# Patient Record
Sex: Female | Born: 1935 | Race: White | Hispanic: No | Smoking: Never smoker
Health system: Southern US, Community
[De-identification: ages and names within clinical notes are randomized; demographics above are authoritative.]

## PROBLEM LIST (undated history)

## (undated) DIAGNOSIS — C679 Malignant neoplasm of bladder, unspecified: Secondary | ICD-10-CM

## (undated) DIAGNOSIS — N184 Chronic kidney disease, stage 4 (severe): Secondary | ICD-10-CM

## (undated) DIAGNOSIS — E611 Iron deficiency: Secondary | ICD-10-CM

## (undated) DIAGNOSIS — R569 Unspecified convulsions: Secondary | ICD-10-CM

## (undated) DIAGNOSIS — M549 Dorsalgia, unspecified: Secondary | ICD-10-CM

## (undated) DIAGNOSIS — R1310 Dysphagia, unspecified: Secondary | ICD-10-CM

## (undated) DIAGNOSIS — E782 Mixed hyperlipidemia: Secondary | ICD-10-CM

## (undated) DIAGNOSIS — G8929 Other chronic pain: Secondary | ICD-10-CM

## (undated) DIAGNOSIS — R531 Weakness: Secondary | ICD-10-CM

## (undated) DIAGNOSIS — G40401 Other generalized epilepsy and epileptic syndromes, not intractable, with status epilepticus: Secondary | ICD-10-CM

## (undated) DIAGNOSIS — G4700 Insomnia, unspecified: Secondary | ICD-10-CM

## (undated) DIAGNOSIS — R269 Unspecified abnormalities of gait and mobility: Secondary | ICD-10-CM

## (undated) DIAGNOSIS — E876 Hypokalemia: Secondary | ICD-10-CM

## (undated) DIAGNOSIS — I2510 Atherosclerotic heart disease of native coronary artery without angina pectoris: Secondary | ICD-10-CM

## (undated) DIAGNOSIS — E669 Obesity, unspecified: Secondary | ICD-10-CM

## (undated) DIAGNOSIS — H3530 Unspecified macular degeneration: Secondary | ICD-10-CM

## (undated) DIAGNOSIS — R42 Dizziness and giddiness: Secondary | ICD-10-CM

## (undated) DIAGNOSIS — I10 Essential (primary) hypertension: Secondary | ICD-10-CM

## (undated) DIAGNOSIS — Z951 Presence of aortocoronary bypass graft: Secondary | ICD-10-CM

## (undated) HISTORY — DX: Malignant neoplasm of bladder, unspecified: C67.9

## (undated) HISTORY — DX: Dizziness and giddiness: R42

## (undated) HISTORY — PX: CORONARY ARTERY BYPASS GRAFT: SHX141

## (undated) HISTORY — DX: Essential (primary) hypertension: I10

## (undated) HISTORY — DX: Dorsalgia, unspecified: M54.9

## (undated) HISTORY — DX: Other generalized epilepsy and epileptic syndromes, not intractable, with status epilepticus: G40.401

## (undated) HISTORY — PX: IRIDECTOMY: SHX1848

## (undated) HISTORY — DX: Unspecified convulsions: R56.9

## (undated) HISTORY — DX: Presence of aortocoronary bypass graft: Z95.1

## (undated) HISTORY — PX: CATARACT EXTRACTION: SUR2

## (undated) HISTORY — DX: Obesity, unspecified: E66.9

## (undated) HISTORY — DX: Atherosclerotic heart disease of native coronary artery without angina pectoris: I25.10

## (undated) HISTORY — DX: Mixed hyperlipidemia: E78.2

---

## 1998-04-07 HISTORY — PX: NEPHRECTOMY: SHX65

## 2002-11-22 ENCOUNTER — Inpatient Hospital Stay (HOSPITAL_COMMUNITY): Admission: EM | Admit: 2002-11-22 | Discharge: 2002-11-25 | Payer: Self-pay | Admitting: Emergency Medicine

## 2002-11-22 ENCOUNTER — Encounter: Payer: Self-pay | Admitting: Emergency Medicine

## 2002-11-30 ENCOUNTER — Ambulatory Visit (HOSPITAL_COMMUNITY): Admission: RE | Admit: 2002-11-30 | Discharge: 2002-11-30 | Payer: Self-pay | Admitting: *Deleted

## 2002-12-14 ENCOUNTER — Encounter: Payer: Self-pay | Admitting: Emergency Medicine

## 2002-12-14 ENCOUNTER — Emergency Department (HOSPITAL_COMMUNITY): Admission: EM | Admit: 2002-12-14 | Discharge: 2002-12-14 | Payer: Self-pay | Admitting: Emergency Medicine

## 2003-07-13 ENCOUNTER — Encounter: Admission: RE | Admit: 2003-07-13 | Discharge: 2003-07-13 | Payer: Self-pay | Admitting: Neurosurgery

## 2003-07-13 ENCOUNTER — Other Ambulatory Visit: Admission: RE | Admit: 2003-07-13 | Discharge: 2003-07-13 | Payer: Self-pay | Admitting: Neurosurgery

## 2003-09-08 ENCOUNTER — Emergency Department (HOSPITAL_COMMUNITY): Admission: EM | Admit: 2003-09-08 | Discharge: 2003-09-09 | Payer: Self-pay | Admitting: Emergency Medicine

## 2004-05-28 ENCOUNTER — Encounter: Payer: Self-pay | Admitting: Urology

## 2004-05-28 ENCOUNTER — Ambulatory Visit (HOSPITAL_COMMUNITY): Admission: RE | Admit: 2004-05-28 | Discharge: 2004-05-28 | Payer: Self-pay | Admitting: Urology

## 2004-06-03 ENCOUNTER — Ambulatory Visit: Payer: Self-pay | Admitting: *Deleted

## 2004-06-13 ENCOUNTER — Encounter (HOSPITAL_COMMUNITY): Admission: RE | Admit: 2004-06-13 | Discharge: 2004-07-13 | Payer: Self-pay | Admitting: *Deleted

## 2004-06-13 ENCOUNTER — Ambulatory Visit: Payer: Self-pay | Admitting: *Deleted

## 2004-06-26 ENCOUNTER — Ambulatory Visit: Payer: Self-pay | Admitting: *Deleted

## 2004-08-28 ENCOUNTER — Emergency Department (HOSPITAL_COMMUNITY): Admission: EM | Admit: 2004-08-28 | Discharge: 2004-08-28 | Payer: Self-pay | Admitting: Emergency Medicine

## 2004-10-17 ENCOUNTER — Ambulatory Visit (HOSPITAL_COMMUNITY): Admission: RE | Admit: 2004-10-17 | Discharge: 2004-10-17 | Payer: Self-pay | Admitting: Ophthalmology

## 2004-11-15 ENCOUNTER — Ambulatory Visit: Payer: Self-pay | Admitting: *Deleted

## 2005-06-07 ENCOUNTER — Emergency Department (HOSPITAL_COMMUNITY): Admission: EM | Admit: 2005-06-07 | Discharge: 2005-06-07 | Payer: Self-pay | Admitting: *Deleted

## 2005-08-06 ENCOUNTER — Encounter: Payer: Self-pay | Admitting: Emergency Medicine

## 2005-12-04 ENCOUNTER — Emergency Department (HOSPITAL_COMMUNITY): Admission: EM | Admit: 2005-12-04 | Discharge: 2005-12-04 | Payer: Self-pay | Admitting: Emergency Medicine

## 2005-12-29 ENCOUNTER — Emergency Department (HOSPITAL_COMMUNITY): Admission: EM | Admit: 2005-12-29 | Discharge: 2005-12-30 | Payer: Self-pay | Admitting: Emergency Medicine

## 2006-01-13 ENCOUNTER — Encounter: Admission: RE | Admit: 2006-01-13 | Discharge: 2006-01-13 | Payer: Self-pay | Admitting: Specialist

## 2006-01-23 ENCOUNTER — Encounter: Admission: RE | Admit: 2006-01-23 | Discharge: 2006-01-23 | Payer: Self-pay | Admitting: Specialist

## 2006-10-29 ENCOUNTER — Encounter: Admission: RE | Admit: 2006-10-29 | Discharge: 2006-10-29 | Payer: Self-pay | Admitting: Internal Medicine

## 2006-11-27 ENCOUNTER — Ambulatory Visit: Payer: Self-pay | Admitting: Cardiovascular Disease

## 2006-12-04 ENCOUNTER — Ambulatory Visit: Payer: Self-pay

## 2007-04-21 ENCOUNTER — Encounter: Payer: Self-pay | Admitting: Emergency Medicine

## 2007-04-21 ENCOUNTER — Inpatient Hospital Stay (HOSPITAL_COMMUNITY): Admission: EM | Admit: 2007-04-21 | Discharge: 2007-04-26 | Payer: Self-pay | Admitting: Emergency Medicine

## 2007-04-22 ENCOUNTER — Encounter (INDEPENDENT_AMBULATORY_CARE_PROVIDER_SITE_OTHER): Payer: Self-pay | Admitting: Neurology

## 2007-04-23 ENCOUNTER — Ambulatory Visit: Payer: Self-pay | Admitting: Physical Medicine & Rehabilitation

## 2007-05-05 ENCOUNTER — Inpatient Hospital Stay (HOSPITAL_COMMUNITY): Admission: EM | Admit: 2007-05-05 | Discharge: 2007-05-07 | Payer: Self-pay | Admitting: *Deleted

## 2007-05-06 ENCOUNTER — Encounter (INDEPENDENT_AMBULATORY_CARE_PROVIDER_SITE_OTHER): Payer: Self-pay | Admitting: Diagnostic Radiology

## 2007-05-24 ENCOUNTER — Ambulatory Visit (HOSPITAL_COMMUNITY): Admission: RE | Admit: 2007-05-24 | Discharge: 2007-05-24 | Payer: Self-pay | Admitting: Neurosurgery

## 2007-05-26 ENCOUNTER — Inpatient Hospital Stay (HOSPITAL_COMMUNITY): Admission: RE | Admit: 2007-05-26 | Discharge: 2007-05-31 | Payer: Self-pay | Admitting: Neurosurgery

## 2007-05-26 ENCOUNTER — Encounter (INDEPENDENT_AMBULATORY_CARE_PROVIDER_SITE_OTHER): Payer: Self-pay | Admitting: Neurosurgery

## 2007-06-21 ENCOUNTER — Encounter: Admission: RE | Admit: 2007-06-21 | Discharge: 2007-06-21 | Payer: Self-pay | Admitting: Neurosurgery

## 2007-08-06 ENCOUNTER — Ambulatory Visit: Payer: Self-pay | Admitting: Cardiovascular Disease

## 2008-02-21 ENCOUNTER — Ambulatory Visit: Payer: Self-pay | Admitting: Cardiovascular Disease

## 2008-05-02 ENCOUNTER — Ambulatory Visit (HOSPITAL_COMMUNITY): Admission: RE | Admit: 2008-05-02 | Discharge: 2008-05-02 | Payer: Self-pay | Admitting: Ophthalmology

## 2008-05-24 ENCOUNTER — Encounter: Payer: Self-pay | Admitting: Cardiovascular Disease

## 2008-05-24 ENCOUNTER — Ambulatory Visit: Payer: Self-pay | Admitting: Cardiovascular Disease

## 2008-05-24 DIAGNOSIS — E782 Mixed hyperlipidemia: Secondary | ICD-10-CM

## 2008-05-24 DIAGNOSIS — I10 Essential (primary) hypertension: Secondary | ICD-10-CM | POA: Insufficient documentation

## 2008-05-24 DIAGNOSIS — Z951 Presence of aortocoronary bypass graft: Secondary | ICD-10-CM | POA: Insufficient documentation

## 2008-06-21 ENCOUNTER — Ambulatory Visit (HOSPITAL_COMMUNITY): Admission: RE | Admit: 2008-06-21 | Discharge: 2008-06-21 | Payer: Self-pay | Admitting: Neurology

## 2008-08-09 ENCOUNTER — Ambulatory Visit (HOSPITAL_COMMUNITY): Admission: RE | Admit: 2008-08-09 | Discharge: 2008-08-09 | Payer: Self-pay | Admitting: Internal Medicine

## 2008-12-06 ENCOUNTER — Emergency Department (HOSPITAL_COMMUNITY): Admission: EM | Admit: 2008-12-06 | Discharge: 2008-12-06 | Payer: Self-pay | Admitting: Emergency Medicine

## 2009-01-30 ENCOUNTER — Ambulatory Visit: Payer: Self-pay | Admitting: Cardiovascular Disease

## 2009-03-20 ENCOUNTER — Telehealth: Payer: Self-pay | Admitting: Cardiovascular Disease

## 2009-11-27 ENCOUNTER — Ambulatory Visit (HOSPITAL_COMMUNITY): Admission: RE | Admit: 2009-11-27 | Discharge: 2009-11-27 | Payer: Self-pay | Admitting: Ophthalmology

## 2010-01-24 ENCOUNTER — Ambulatory Visit: Payer: Self-pay | Admitting: Cardiovascular Disease

## 2010-04-28 ENCOUNTER — Encounter: Payer: Self-pay | Admitting: Neurosurgery
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
## 2010-07-12 LAB — BASIC METABOLIC PANEL
BUN: 32 mg/dL — ABNORMAL HIGH (ref 6–23)
Calcium: 9.8 mg/dL (ref 8.4–10.5)
Creatinine, Ser: 2.14 mg/dL — ABNORMAL HIGH (ref 0.4–1.2)
GFR calc Af Amer: 27 mL/min — ABNORMAL LOW (ref >60)
GFR calc non Af Amer: 22 mL/min — ABNORMAL LOW (ref >60)

## 2010-07-12 LAB — CBC
MCV: 91.6 fL (ref 78.0–100.0)
Platelets: 151 10*3/uL (ref 150–400)
RBC: 4.19 MIL/uL (ref 3.87–5.11)
WBC: 7.9 10*3/uL (ref 4.0–10.5)

## 2010-07-12 LAB — DIFFERENTIAL
Eosinophils Absolute: 0.2 10*3/uL (ref 0.0–0.7)
Lymphs Abs: 3.4 10*3/uL (ref 0.7–4.0)
Monocytes Relative: 9 % (ref 3–12)
Neutro Abs: 3.6 10*3/uL (ref 1.7–7.7)
Neutrophils Relative %: 45 % (ref 43–77)

## 2010-07-12 LAB — URINALYSIS, ROUTINE W REFLEX MICROSCOPIC
Glucose, UA: NEGATIVE mg/dL
Specific Gravity, Urine: 1.025 (ref 1.005–1.030)
Urobilinogen, UA: 0.2 mg/dL (ref 0.0–1.0)

## 2010-07-12 LAB — URINE MICROSCOPIC-ADD ON

## 2010-07-16 LAB — COMPREHENSIVE METABOLIC PANEL
ALT: 19 U/L (ref 0–35)
AST: 26 U/L (ref 0–37)
Alkaline Phosphatase: 67 U/L (ref 39–117)
BUN: 35 mg/dL — ABNORMAL HIGH (ref 6–23)
Creatinine, Ser: 2.14 mg/dL — ABNORMAL HIGH (ref 0.4–1.2)
GFR calc non Af Amer: 22 mL/min — ABNORMAL LOW (ref >60)
Glucose, Bld: 108 mg/dL — ABNORMAL HIGH (ref 70–99)
Total Bilirubin: 0.5 mg/dL (ref 0.3–1.2)

## 2010-07-16 LAB — CBC
HCT: 39.7 % (ref 36.0–46.0)
Hemoglobin: 13.6 g/dL (ref 12.0–15.0)
MCHC: 34.2 g/dL (ref 30.0–36.0)
Platelets: 172 10*3/uL (ref 150–400)
RDW: 15.7 % — ABNORMAL HIGH (ref 11.5–15.5)

## 2010-07-22 LAB — BASIC METABOLIC PANEL
BUN: 22 mg/dL (ref 6–23)
CO2: 27 mEq/L (ref 19–32)
Calcium: 9.3 mg/dL (ref 8.4–10.5)
GFR calc non Af Amer: 28 mL/min — ABNORMAL LOW (ref >60)
Glucose, Bld: 151 mg/dL — ABNORMAL HIGH (ref 70–99)

## 2010-07-24 ENCOUNTER — Emergency Department (HOSPITAL_COMMUNITY)
Admission: EM | Admit: 2010-07-24 | Discharge: 2010-07-24 | Disposition: A | Discharge status: Home / Self Care | Payer: Medicare Other | Attending: Emergency Medicine | Admitting: Emergency Medicine

## 2010-07-24 ENCOUNTER — Emergency Department (HOSPITAL_COMMUNITY): Payer: Medicare Other

## 2010-07-24 DIAGNOSIS — R109 Unspecified abdominal pain: Secondary | ICD-10-CM | POA: Insufficient documentation

## 2010-07-24 DIAGNOSIS — Z85528 Personal history of other malignant neoplasm of kidney: Secondary | ICD-10-CM | POA: Insufficient documentation

## 2010-07-24 DIAGNOSIS — F3289 Other specified depressive episodes: Secondary | ICD-10-CM | POA: Insufficient documentation

## 2010-07-24 DIAGNOSIS — Z7982 Long term (current) use of aspirin: Secondary | ICD-10-CM | POA: Insufficient documentation

## 2010-07-24 DIAGNOSIS — Z8673 Personal history of transient ischemic attack (TIA), and cerebral infarction without residual deficits: Secondary | ICD-10-CM | POA: Insufficient documentation

## 2010-07-24 DIAGNOSIS — Z79899 Other long term (current) drug therapy: Secondary | ICD-10-CM | POA: Insufficient documentation

## 2010-07-24 DIAGNOSIS — G8929 Other chronic pain: Secondary | ICD-10-CM | POA: Insufficient documentation

## 2010-07-24 DIAGNOSIS — F329 Major depressive disorder, single episode, unspecified: Secondary | ICD-10-CM | POA: Insufficient documentation

## 2010-07-24 DIAGNOSIS — I10 Essential (primary) hypertension: Secondary | ICD-10-CM | POA: Insufficient documentation

## 2010-07-24 DIAGNOSIS — Z8551 Personal history of malignant neoplasm of bladder: Secondary | ICD-10-CM | POA: Insufficient documentation

## 2010-07-24 DIAGNOSIS — Z951 Presence of aortocoronary bypass graft: Secondary | ICD-10-CM | POA: Insufficient documentation

## 2010-07-24 DIAGNOSIS — IMO0002 Reserved for concepts with insufficient information to code with codable children: Secondary | ICD-10-CM | POA: Insufficient documentation

## 2010-07-24 DIAGNOSIS — I2510 Atherosclerotic heart disease of native coronary artery without angina pectoris: Secondary | ICD-10-CM | POA: Insufficient documentation

## 2010-07-24 DIAGNOSIS — R339 Retention of urine, unspecified: Secondary | ICD-10-CM | POA: Insufficient documentation

## 2010-07-24 DIAGNOSIS — G40802 Other epilepsy, not intractable, without status epilepticus: Secondary | ICD-10-CM | POA: Insufficient documentation

## 2010-07-24 DIAGNOSIS — N289 Disorder of kidney and ureter, unspecified: Secondary | ICD-10-CM | POA: Insufficient documentation

## 2010-07-24 DIAGNOSIS — F411 Generalized anxiety disorder: Secondary | ICD-10-CM | POA: Insufficient documentation

## 2010-07-24 LAB — DIFFERENTIAL
Basophils Absolute: 0 10*3/uL (ref 0.0–0.1)
Basophils Relative: 1 % (ref 0–1)
Eosinophils Relative: 4 % (ref 0–5)
Lymphocytes Relative: 39 % (ref 12–46)
Neutro Abs: 4 10*3/uL (ref 1.7–7.7)

## 2010-07-24 LAB — COMPREHENSIVE METABOLIC PANEL
ALT: 20 U/L (ref 0–35)
CO2: 21 mEq/L (ref 19–32)
Calcium: 9.2 mg/dL (ref 8.4–10.5)
Chloride: 111 mEq/L (ref 96–112)
Creatinine, Ser: 2.34 mg/dL — ABNORMAL HIGH (ref 0.4–1.2)
GFR calc non Af Amer: 20 mL/min — ABNORMAL LOW (ref >60)
Glucose, Bld: 101 mg/dL — ABNORMAL HIGH (ref 70–99)
Total Bilirubin: 0.3 mg/dL (ref 0.3–1.2)

## 2010-07-24 LAB — CBC
HCT: 36.6 % (ref 36.0–46.0)
MCHC: 32.2 g/dL (ref 30.0–36.0)
RDW: 14.6 % (ref 11.5–15.5)
WBC: 8 10*3/uL (ref 4.0–10.5)

## 2010-07-24 LAB — URINALYSIS, ROUTINE W REFLEX MICROSCOPIC
Ketones, ur: NEGATIVE mg/dL
Leukocytes, UA: NEGATIVE
Nitrite: NEGATIVE
Protein, ur: 30 mg/dL — AB
Urobilinogen, UA: 0.2 mg/dL (ref 0.0–1.0)
pH: 5 (ref 5.0–8.0)

## 2010-07-24 LAB — LIPASE, BLOOD: Lipase: 60 U/L — ABNORMAL HIGH (ref 11–59)

## 2010-08-20 NOTE — H&P (Signed)
NAME:  [name] [name], [name] [name]                  ACCOUNT NO.:  [id number]   MEDICAL RECORD NO.:  [id number]          PATIENT TYPE:  INP   LOCATION:  3109                         FACILITY:  [place]   PHYSICIAN:  Leeroy Cha, M.D.   DATE OF BIRTH:  [birth year]   DATE OF ADMISSION:  05/26/2007  DATE OF DISCHARGE:                              HISTORY & PHYSICAL   HISTORY:  Ms. Trolinger is a lady who has been admitted in the [facility] on several occasions because of numbness of the left upper  extremity.  The patient had several MRIs.  At the beginning it was felt  that she had a stroke, but later on, repeat of the MRIs showed the  possibility that she might have either infection or tumor of the brain.  Because of that, she is being admitted to the hospital.  I am familiar  with her and the whole family.  In my office last week, we talked about  the pros and cons of the biopsy.  The patient went home and she decided  to proceed with the biopsy because the numbness is not any better.   PAST MEDICAL HISTORY:  1. Cataract surgery.  2. Bladder cancer.  3. Hysterectomy.  4. She has a history of seizures, she is on Keppra.  5. She has also history of dizziness.   MEDICATIONS:  1. Crestor.  2. Keppra.  3. Vitamin C.   FAMILY HISTORY:  Unremarkable.   SOCIAL HISTORY:  Negative.   REVIEW OF SYSTEMS:  Mostly positive at the present time for some  headache and numbness of the left upper extremity.   PHYSICAL EXAMINATION:  HEENT:  Normal.  NECK:  She has mild discomfort with lateral rotation of the neck.  LUNGS:  Clear.  HEART:  Sounds normal.  EXTREMITIES:  Normal pulses.  NEURO:  Essentially normal except for some numbness which compromises  mostly in the left upper extremity.   DIAGNOSTICS:  The MRI and CT scan shows a lesion in the right front  parietal area.   IMPRESSION:  Right frontal parietal area lesion, rule out tumor versus  infection.   RECOMMENDATIONS:  1. The patient is  being admitted for surgery.  The surgery will be an      open biopsy using the Stealth program  And probably intraoperative      sonogram.  The __________ inability to make the diagnosis.  2. Weakness and worsening of the numbness of the left upper extremity,      infections, paralysis.           ______________________________  Leeroy Cha, M.D.     EB/MEDQ  D:  05/26/2007  T:  05/27/2007  Job:  [id number]
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
## 2010-08-20 NOTE — Consult Note (Signed)
NAME:  Dant, Zurisadai                  ACCOUNT NO.:  [id number]   MEDICAL RECORD NO.:  [id number]          PATIENT TYPE:  INP   LOCATION:  3703                         FACILITY:  [place]   PHYSICIAN:  Leeroy Cha, M.D.   DATE OF BIRTH:  May 22, 1928   DATE OF CONSULTATION:  DATE OF DISCHARGE:  05/07/2007                                 CONSULTATION   REPORT TITLE:  NEUROSURGICAL CONSULTATION   HISTORY OF PRESENT ILLNESS:  Ms. Lamoureux is a lady who has been admitted to  the hospital on two occasions because of weakness and numbness on the  left side.  The first time she improved.  She went home and then she  developed the same problem with numbness.  She was admitted to the  hospital.  In the hospital, she has had repeat MRI and a CT scan which  showed the possibility of a lesion in the right parietal area.  The  patient had a lumbar puncture yesterday with the CSF being unremarkable.  At the present time, the patient is awake following commands.  She  complains of heaviness of the left arm and left leg with some numbness.  Otherwise, she is clinically stable.  I looked at the 2 MRIs and the 2  CT scans and indeed in the right parietal area there is a lesion with  attenuation of the gyrus.  There is no displacement.  All the previous  __________.   CLINICAL IMPRESSION:  Mass in the right parietal area rule out the  possibility of low grade glioma, lymphoma.   RECOMMENDATIONS:  I talked to Dr. Leonie Man, who is the neurologist in  charge.  I talked to the patient at length.  I fully explained to her  what the problem is.  I agree that the only way to find out what is  going on is with a biopsy.  We are going to bring her family to talk to  them and will make a decision and hopefully we will do the biopsy early  next week.           ______________________________  Leeroy Cha, M.D.     EB/MEDQ  D:  05/07/2007  T:  05/07/2007  Job:  [id number]

## 2010-08-20 NOTE — Op Note (Signed)
NAME:  [name] [name], [name] [name]                  ACCOUNT NO.:  [id number]   MEDICAL RECORD NO.:  [id number]          PATIENT TYPE:  INP   LOCATION:  3109                         FACILITY:  [facility]   PHYSICIAN:  Leeroy Cha, M.D.   DATE OF BIRTH:  Jul 16, 1928   DATE OF PROCEDURE:  05/26/2007  DATE OF DISCHARGE:                               OPERATIVE REPORT   PREOPERATIVE DIAGNOSIS:  Mass in the right frontal parietal area.   POSTOPERATIVE DIAGNOSES:  Mass in the right frontal parietal area.   PROCEDURE:  Open biopsy of the right frontoparietal mass, Stealth.   SURGEON:  Leeroy Cha, M.D.   ASSISTANT:  Hosie Spangle, M.D.   CLINICAL HISTORY:  The patient was admitted after she had a complete  workup by a neurologist.  Stroke was ruled out.  MRI showed enhanced  lesion in the right frontoparietal area.  Biopsy was done to rule out  possibility of infection versus tumor.  The risks were explained to her  and the family.   PROCEDURE:  The patient was taken to the OR and after intubation,  drapings were applied to the head.  With the Stealth equipment, we  localized precisely the area where the tumor was.  Then drapes were  applied.  Infiltration and an S-italic type of shape incision was made  in the scalp.  Then a bur hole was made and a small inch craniotomy was  done.  Immediately we found that the dura mater was thick.  Incision was  made and once we opened the dura mater we found there was two gyrus.  One was completely normal and the other was dull and with some  cloudiness.  It had piece of biopsy of the dura mater was made and we  took two pieces of biopsies of the brain which was sent to the  laboratory.  It was reported as possibility of no tumor, probably  infection.  After that, the area was irrigated.  Hemostasis was done  with bipolar.  Duragen was used to protect the brain.  The tack up  suture from the edges of the bone to the dura mater was were made and  the bone flap  was put back in place using two small plates.  The scalp  was closed with Vicryl and staples.  The patient did well and she is  going back to the intensive care unit.           ______________________________  Leeroy Cha, M.D.     EB/MEDQ  D:  05/26/2007  T:  05/27/2007  Job:  [id number]   cc:   Pramod P. Leonie Man, MD

## 2010-08-20 NOTE — Discharge Summary (Signed)
NAME:  [name] [name], [name] [name]                  ACCOUNT NO.:  [id number]   MEDICAL RECORD NO.:  [id number]          PATIENT TYPE:  INP   LOCATION:  3029                         FACILITY:  [place]   PHYSICIAN:  Pramod P. Leonie Man, MD    DATE OF BIRTH:  Oct 21, 1928   DATE OF ADMISSION:  04/21/2007  DATE OF DISCHARGE:  04/26/2007                               DISCHARGE SUMMARY   DIAGNOSIS AT TIME OF DISCHARGE.:  1. Right parietal subarachnoid hemorrhage, question etiology,      angiogram normal.  2. Probable seizure, placed on Keppra.  3. Coronary artery disease with myocardial infarction status post      coronary artery bypass graft in 1989.  4. Hypercholesterolemia.  5. Obesity.  6. Chronic back pain.  7. Chronic dizziness.  8. Hysterectomy.  9. Bladder cancer.  10.Recent cataract surgery on the right eye with a history of some      bleeding on that side.  11.Hyperhomocysteinemia.   MEDICINES AT TIME OF DISCHARGE.:  1. Crestor 20 mg a day.  2. Multivitamin a day.  3. Keppra 250 mg b.i.d.  4. Metanx one a day.  5. Vitamin C once a day.   STUDIES PERFORMED:  1. MRI of the brain shows acute subacute partially hemorrhagic right      parietal lobe infarct, abnormal appearance of the sulci in this      region, atrophy, old right corona radiata infarct, and minimal      small vessel disease, degenerative changes C3-C4.  2. Follow-up MRI of the brain without contrast shows increased signal      within the subarachnoid space and posterior right frontal and      parietal lobes.  There are secondary changes within the parenchyma      which appears to be subarachnoid process, concern for hemorrhage,      or infections.  Next, remote lacunar infarct involving the right      putamen and anterior limb of the internal capsule, mild age-      advanced atrophy.  3. MRA of the head shows potential high-grade stenosis, supraclinoid      right internal carotid artery.  Vessel loop versus 4 mm aneurysm at  the ICA terminus on the left.  Right greater than left proximal PCA      stenosis.  This may be high-grade on the right.  Segmental signal      loss within the MCA branches compatible with small vessel      intracranial atherosclerotic changes.  4. MRA of the neck shows no significant carotid artery disease.      Incomplete evaluation of the proximal vessels of the neck at that      aortic arch due to technical factors.  Dominant right vertebral      artery.  Left vertebral artery shows significantly diminished      signal that could represent high-grade proximal stenosis or      dissection.  5. CT of the brain shows persistent sulcal effacement and slight      increased density within the sulci of the  posterior right frontal      lobe.  Stable remote lacunar infarcts of the basal ganglia on the      right.  No other new acute abnormality.  6. Follow-up MRI on April 22, 2007, shows subarachnoid hemorrhage,      question etiology, does not have typical appearance.  There is      increase in signal in the right cerebellum and a tiny acute      infarct.  This level cannot be excluded.  A tiny area of blood      breakdown products within the anterior right temporal lobe related      to prior episode of hemorrhagic ischemia.  There is a tiny      cavernous angioma resultant of trauma.  Could consider metastatic      disease.  Old infarct, right caudate head superior anterior aspect      of the right basal ganglia.  7. MRA of the brain again shows primary intracranial atherosclerotic-      type changes, right ICA carotid.  8. Cerebral angiogram shows a focal area of severe stenosis involving      the right MCA main trunk right proximal to the bifurcation.  Severe      focal stenosis of left vertebral artery at its origin with      opacification of the left pica.  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  Focal area of      severe stenosis of the  right posterior cerebral artery P1 segment.      50% stenosis of the right internal carotid artery at its distal      cavernous proximal supraclinoid segment.  9. EEG abnormal secondary to diffuse slowing.  10.2-D echocardiogram shows EF of 60% with no left ventricular      regional wall motion abnormalities.  No embolic source.  11.Carotid Doppler shows no ICA stenosis.   LABORATORY STUDIES:  CBC normal.  Chemistry with creatinine 1.7, glucose  106, otherwise normal.  Coagulation studies normal.  Liver function  tests normal.  Cardiac enzymes negative.  Cholesterol 235, triglycerides  147, HDL 30, LDL 176.  Urinalysis with 7-10 white blood cells, 0-2 red  blood cells, few squamous epithelium, no protein, small leukocyte  esterase.  Sedimentation rate is 30.  Complement C3 181, C4 30.  ANA  negative.  CH50 62.  Homocystine 18.5. Hypercoagulable panel with  antithrombin-III 119, protein C functional high 151, protein C 103,  protein S functional 122.  Lupus anticoagulant not detected.  No  prothrombin-II gene mutation.  Hemoglobin A1c of 6.3.   HISTORY OF PRESENT ILLNESS:  [name] [name] is a 77 year old right-handed  Caucasian female with a past medical history of coronary artery disease  and hypercholesterolemia.  Sunday night she went to bed in her normal  state of health and awoke 6:00 a.m. on Monday morning, feeling numb on  the left side of her body.  She also felt weak on that side with some  blurring of her vision.  She called her primary care physician, but they  were unable to see her until Wednesday.  When she was seen, she was was  then sent to the emergency room for evaluation of possible stroke which  is now two days later.  She also complains of some blurring of her  vision.  An MRI scan done in the emergency room confirmed that she has  what looks like a subacute stroke in the right parietal region.  She  notes an episode similar to this in November 2008 where she had a  left  hematemesis while shopping.  This cleared up days later and she was  almost completely back to baseline but would not go and seek any medical  care at that time.  In the emergency room, she was not a TPA candidate  secondary to time of onset being greater than three hours.  She was  admitted to the hospital for further stroke workup.   HOSPITAL COURSE:  Hospital course was positive for right parietal lobe  localized subarachnoid hemorrhage.  Cerebral angiogram showed no  aneurysm or other etiology of the hemorrhage.  The patient, of note,  also had a new right pontine infarct which was likely representative of  her current symptoms.  She also had a question right brain seizure and  was started on Keppra.  She was seen by PT, OT, and Speech and felt she  would benefit from rehab.  However, she improved significantly during  hospitalization and home health was felt to be most appropriate.  Arrangements were made for home health PT and OT and patient was ready  for discharge on April 25, 2007.  However, there was no was one to  transport her there.  On April 26, 2007, the patient was discharged  without incident.  She was placed on on Ultram for headache and will  continue on Keppra and follow up with Dr. Leonie Man in 2-3 months.   CONDITION ON DISCHARGE:  The patient alert and oriented x3.  No aphasia.  No dysarthria.  Eye movements are full.  No field defects.  Moves all  extremities x4.   DISCHARGE/PLAN:  1. Discharge home with family.  2. Home health PT and OT.  3. No aspirin or aspirin-containing products.  4. Ongoing risk factor control by primary care physician.  5. Follow-up Dr. Leonie Man in 2-3 months.  6. Ultram for headache.  7. Continue Keppra for now.      Burnetta Sabin, N.P.    ______________________________  Kathie Rhodes. Leonie Man, MD    SB/MEDQ  D:  04/26/2007  T:  04/26/2007  Job:  [id number]   cc:   Pramod P. Leonie Man, MD  [facility] Durie, P.A.
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
## 2010-08-20 NOTE — Procedures (Signed)
EEG NUMBER:  [id number]   HISTORY:  This is a 77 year old with left-sided weakness, numbness.  The  patient had an EEG done to evaluate for seizure activity.   PROCEDURE:  This is a portable EEG.   TECHNICAL DESCRIPTION:  Throughout this portable EEG there is no  distinct or sustained posterior dominant rhythm noted.  The background  activity is fairly symmetrical and was comprised of mixed frequency  activity, primarily theta range activity at 10-20 microvolts with photic  stimulation or hyperventilation performed throughout this record.  The  patient does fall asleep during this tracing.  Throughout the circuit  there is no evidence of electrographic seizures or discharge activity.  Heart rate shows 70 to 100 beats per minute.   IMPRESSION:  This routine electroencephalogram is abnormal secondary to  a diffuse slowing, but diffuse slowing is suggestive of a toxic,  metabolic or primary neuronal disorder.  Clinical correlation is  advised.      Shaune Pascal. Estella Husk, M.D.  Electronically Signed     [id number]  D:  04/23/2007 19:45:44  T:  04/23/2007 23:23:29  Job #:  [id number]

## 2010-08-20 NOTE — Discharge Summary (Signed)
NAME:  [name] [name], [name] [name]                  ACCOUNT NO.:  [id number]   MEDICAL RECORD NO.:  [id number]          PATIENT TYPE:  INP   LOCATION:  3703                         FACILITY:  [facility]   PHYSICIAN:  Pramod P. Leonie Man, MD    DATE OF BIRTH:  17-Jun-1928   DATE OF ADMISSION:  05/04/2007  DATE OF DISCHARGE:  05/07/2007                               DISCHARGE SUMMARY   DIAGNOSES AT TIME OF DISCHARGE:  1. Left hemiparesis, question etiology in setting of right parietal      edema.  Question tumor versus infection.  Stroke ruled out.  2. Dyslipidemia.  3. Hyperhomocysteinemia.  4. Obesity.  5. Probable seizures.  6. Coronary artery disease, status post coronary artery bypass graft      in 1989.  7. Bladder cancer.  8. Chronic back pain.  9. Chronic dizziness.  10.Hysterectomy.  11.Recent cataract surgery on the right eye with monthly injections.   MEDICINES AT THE TIME OF DISCHARGE:  1. Crestor 20 mg a day.  2. Keppra 250 mg b.i.d.  3. Metanx once a day.  4. Multivitamin a day.  5. Vitamin C a day.  6. Meclizine p.r.n.  7. Vicodin p.r.n.   STUDIES PERFORMED:  A CT of the brain on admission showed an abnormally  swollen and hyperdense right posterior frontoparietal cortex.  Time  course is not typical for infarction.  There may be leptomeningeal  spread of tumor,  atypical infarct or infection/inflammation.   MR of the brain shows persistent abnormality within the right parietal/  posterior frontal region.  Raises possibility of leptomeningeal spread  of tumor with other considerations.   An LP under fluoro with 19 mL CSF removed, 12 cm of opening pressure.  See results below.   EKG shows normal sinus rhythm, left axis deviation, anterior infarct,  age undetermined.   LABORATORY STUDIES:  CBC with white blood cells 11.7, otherwise normal.  Chemistry with creatinine 1.8.  Frozen spinal fluid:  Spinal fluid  glucose 59, total protein 68.  Cell count with 360 red blood cells, 11  white blood cells, 1 neutrophil, 94 lymphocytes, 5 monocytes, 0  eosinophils.  Cryptococcal antigen is negative.  Titer is not indicated.  Cytology shows no malignant cells.  No other organisms.  Increased cells  of inflammation, specifically lymphocytes.   HISTORY OF PRESENT ILLNESS:  [name] [name] is a 77 year old right-  handed Caucasian female who was recently hospitalized January 14 through  January 19 with left-sided weakness.  This had happened previously in  November and resolved on its own.  The patient was evaluated and found  to have what appeared to be effacement of the sulci over the right  posterior frontal and parietal region suggesting a subacute stroke.  MRI  did not support this but did show evidence of increased signal within  the subarachnoid spaces, suggesting subarachnoid blood, and also some  changes on the diffusion that suggested very limited ischemia that did  not appear to be confluent or wedge-shaped to suggest a branch  occlusion.   At that time, the patient had an  angiogram, which failed to show  evidence of AV malformation or aneurysmal dilatation.  It did show  significant narrowing of the right MCA main trunk proximal to the  bifurcation and severe focal stenosis of the left vertebral artery at  its origin with opacification of the left PICA.  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  The EEG showed  diffuse slowing.  A 2-D echocardiogram showed EF of 60% with no left  ventricular regional wall motion abnormalities and no source of emboli.  Carotid Doppler showed no hemodynamically significant stenosis.  MRA at  that time showed intracranial atherosclerotic disease.  There was a  small increase in signal in  the right cerebellum and a tiny acute  infarct in the middle cerebellar peduncle.  There was a tiny cavernous  angioma.  There was an old infarction in the right caudate head,  superior aspect.   The patient was discharged home able to ambulate on her own but with  some mild weakness in her leg.   The night of admission, she finished dinner and sat in her chair to  watch a TV program at 8:00 p.m.  She tried to get up somewhere between  10:00 and 10:30 and her leg gave way.  She hyperflexed the toes of her  left side, spraining them.  She presented to [facility] emergency room  with weakness of the left arm and leg.  These  substantially cleared,  but she continued to have left-sided weakness.  She also complained of  headache and nausea.  Neurology was called to evaluate the patient.  The  CT of the brain continued to show effacement of the sulci.  The patient  was admitted for further  exploration.   HOSPITAL COURSE:  MRI was completed, which did not reveal an acute  stroke but  increased edema in the temporal parietal region since  admission two  weeks ago.  Lumbar puncture was performed with no  malignant cells noted.  Question etiology of this edema though feel it  is more likely to be tumor than infection due to the patient's  stabilizing neuro status and improvement in neurologic symptoms.  The  patient has agreed to biopsy by Dr. Joya Salm next week on Tuesday or  Wednesday.  In the meanwhile, the patient is medically stable for  discharge.  Home health PT and OT have been arranged, and she will  discharge home with readmission per Dr. Joya Salm.   CONDITION ON DISCHARGE:  Alert and oriented x3.  No aphasia.  No  dysarthria.  No arm drift.  No focal weakness.  No sensory loss.   DISCHARGE/PLAN:  1. Discharge home.  2. Continue medicines as prior to admission.  3. Plan brain biopsy by Dr. Leeroy Cha on Tuesday or Wednesday      next week.  4. Home Health PT, OT followup by  [place].  5. Follow up with Dr. Mamie Nick P. Sethi in 2 to 3 months.      Burnetta Sabin, N.P.    ______________________________  Kathie Rhodes. Leonie Man, MD    SB/MEDQ  D:  05/07/2007  T:  05/08/2007  Job:  [id number]   cc:   Pramod P. Leonie Man, MD  Leeroy Cha, M.D.  Kemper Durie, P.A.

## 2010-08-20 NOTE — Procedures (Signed)
NAME:  [name] [name], [name] [name]                  ACCOUNT NO.:  [id number]   MEDICAL RECORD NO.:  [id number]          PATIENT TYPE:  OUT   LOCATION:  RESP                          FACILITY:  [facility]   PHYSICIAN:  Kofi A. Merlene Laughter, M.D. DATE OF BIRTH:  July 25, 1928   DATE OF PROCEDURE:  06/21/2008  DATE OF DISCHARGE:                              EEG INTERPRETATION   REFERRING PHYSICIAN:  Pramod P. Leonie Man, MD   INDICATIONS:  This is a 76 year old female who presents with recurrent  seizures.   MEDICATIONS:  Keppra, Vicodin , vitamin B12, vitamin B6, Crestor,  lisinopril, temazepam, meclizine.   ANALYSIS:  A 16-channel recording is conducted for approximately 20  minutes.  There is a posterior dominant rhythmic as high as 7 Hz, but  typically runs between 6-7 Hz.  There is beta activities observed in the  frontal areas.  Awake and drowsy activities are recorded.  Photic  stimulation is carried out without significant changes in the background  activity.  No clear focal or lateralized slowing is observed.  There is  recurrent sharp wave activity observed at C4 where its phase reverses,  although not associated with the fields.  The sharp wave activity seen  in multiple montages.   IMPRESSION:  Abnormal recording due to the following:  1. Right central epileptiform discharges.  2. Mild generalized slowing.      Kofi A. Merlene Laughter, M.D.  Electronically Signed     KAD/MEDQ  D:  06/22/2008  T:  06/22/2008  Job:  [id number]
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
## 2010-08-20 NOTE — H&P (Signed)
NAME:  [name] [name], [name] [name]                  ACCOUNT NO.:  [id number]   MEDICAL RECORD NO.:  [id number]          PATIENT TYPE:  INP   LOCATION:  3703                         FACILITY:  [facility]   PHYSICIAN:  Princess Bruins. Hickling, M.D.DATE OF BIRTH:  [birth year]   DATE OF ADMISSION:  05/05/2007  DATE OF DISCHARGE:                              HISTORY & PHYSICAL   CHIEF COMPLAINT:  Left-sided weakness.   HISTORY OF THE PRESENT CONDITION:  The patient was recently hospitalized  at [facility], January 14 to January 19 with left-sided  weakness.  This had happened previously in November and resolved on its  own.  The patient was evaluated and was found to have what appeared to  be effacement of the sulci over the right posterior frontal and parietal  regions suggesting a subacute stroke.  MRI scan did not show this, but  did show evidence of increased signal within the subarachnoid spaces,  suggesting subarachnoid blood, and also some changes on the diffusion  that suggested very limited ischemia that did not appear to be confluent  or wedge-shaped to suggest a branch occlusion.   The patient had an angiogram which failed to show evidence of  arteriovenous malformation or aneurysmal dilatation.  It did show  evidence of significant narrowing of the right middle cerebral artery  main trunk proximal to the bifurcation and severe focal stenosis of left  vertebral artery at its origin with opacification of the left PICA.  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  EEG showed diffuse slowing.  A 2-D echocardiogram  showed ejection fraction of 60% with no left ventricular regional wall  abnormalities and no source of emboli; carotid  Doppler, no  hemodynamically significant stenosis.  MRA showed intracranial  atherosclerotic disease.  There was a small increase in signal in the  right cerebellum and a tiny acute infarct in the middle cerebellar  peduncle.  There was a tiny cavernous angioma.  There was an old  infarction in the right caudate head, superior aspect.   The patient was discharged home, able to ambulate on her own, but with  mild weakness in her leg.   Tonight, she finished dinner and sat in a chair to watch a TV program at  8 o'clock.  She tried to get up somewhere between 10 and 10:30 and her  leg gave way.  She hyperflexed the toes of her left side, spraining  them.  She presented to [facility] with weakness of the  left arm and leg.  These substantially cleared, but she continued to  have left-sided weakness.  She also complained headache and nausea.   I was contacted by Dr. Orlie Dakin and agreed to admit the patient to  the hospital and assess her.   CT scan of the brain was carried out and  continues to show effacement of  the sulci.  The films are largely unchanged from April 21, 2006.  The  patient says that she cannot have another MRI scan unless she is heavily  sedated; she is very claustrophobic.   CURRENT MEDICATIONS:  1. Meclizine 25 mg as needed every 6 hours.  2. Crestor 20 mg daily.  3. Keppra 250 mg twice daily.  4. Metanx 1 tablet daily.  5. Vicodin 1 every 6 hours as needed.   DRUG ALLERGIES:  PENICILLIN and SULFA.   FAMILY HISTORY:  Positive for stroke, coronary artery disease, cancer  and diabetes mellitus.   SOCIAL HISTORY:  The patient is a retired [employer] scientist.  She is married.  She  lives with her husband.  She does not use tobacco, alcohol or drugs.   REVIEW OF SYSTEMS:  Left-sided weakness has worsened.  CARDIOVASCULAR:  She has coronary artery disease.  PULMONARY:  No COPD.  GI: Positive for  nausea.  Negative for vomiting and diarrhea.  GU:  No  urinary tract  infection or dysuria.  MUSCULOSKELETAL: She has pain in her proximal  interphalangeal joints of the toes on the left.  REPRODUCTIVE:  Postmenopausal.  ENDOCRINE:  No diabetes.  No anemia or bruisability.  ALLERGY:  Negative.  NEUROPSYCHIATRIC:  No depression or anxiety.   PAST MEDICAL HISTORY:  Positive for:  1. Chronic low back pain.  2. Complaints of dizziness.  3. Macular degeneration, right eye greater than left.  She receives      injections in the eye for that time.   PAST SURGICAL HISTORY:  1. Coronary artery bypass graft in 1989.  2. Hysterectomy.  3. Bladder cancer.  4. Right iridectomy.   EXAMINATION:  VITAL SIGNS:  Blood pressure 136/79, resting pulse 71,  respirations 18, oxygen saturation 93%, weight 83.6 kg, temperature  97.8.  HEENT:  No signs of infection.  NECK:  No bruits.  Supple neck.  LUNGS:  Clear.  HEART:  No murmurs.  ABDOMEN:  Bowel sounds normal.  SKIN:  Negative.  NEUROLOGIC:  Awake, alert, attentive, appropriate.  Cranial nerves are  intact.  She has round reactive pupils.  She has small fundi; it  difficult to see.  I believe that I can see the macular degeneration in  the right eye; I cannot see it on the left.  Visual fields are full to  confrontation and to double simultaneous stimuli.  Symmetric facial  strength.  Midline tongue and uvula.  Air conduction greater than bone  conduction bilaterally.  Motor Examination:  She has slight drift of the  left arm.  She has 4+/5 strength in the left arm and clumsy fine motor  movements with finger apposition.  With finger tapping, she actually  moves her thumb and forefinger quite well.  The right side is normal.  In the lower extremities, she has 4-/5 strength proximally, 4+ distally.  She has very tender toes and so it is hard to examine the left foot.  The right leg is entirely normal.  Sensation showed slight decrease to  pinprick in the left arm that was reproducible.  She had equal  sensation  in her face bilaterally and legs bilaterally.  She had a good  proprioceptive sense, slight decreased vibration distally, good  stereoagnosis bilaterally.  She did not extinguish to double  simultaneous stimuli.   Her NIH stroke scale score was 4, modified Rankin 1.   LABORATORY TESTS:  Sodium 138, potassium 3.9, chloride 106, CO2 26.1,  creatinine 1.8, BUN 23, glucose 119.  White blood cell count 11,700,  hemoglobin 13.7, hematocrit 40, platelet count 267,000.   IMAGING STUDY:  CT scan was described above.   IMPRESSION:  Recurrent left-sided weakness. (342.02) The patient does  have significant stenosis in the middle cerebral artery on the right  side, so this could represent transient ischemic attack versus actually  a completed stroke.  I think it would be worthwhile to try to sedate her  and repeat her MRI scan.  She needs physical therapy for gait training.  I do not believe that she needs other laboratories carried out.  She had  a swallowing screen which was normal.  Her risk factors are tended to at  this time as a result of the previous hospitalization.      Princess Bruins. Gaynell Face, M.D.  Electronically Signed     WHH/MEDQ  D:  05/05/2007  T:  05/05/2007  Job:  [id number]   cc:   Kemper Durie, P.A.

## 2010-08-23 NOTE — Cardiovascular Report (Signed)
NAME:  GEORGI, HAKIMIAN NO.:  [id number]   MEDICAL RECORD NO.:  [id number]                   PATIENT TYPE:  OIB   LOCATION:  6527                                 FACILITY:  [facility]   PHYSICIAN:  Satira Sark, M.D. [facility]        DATE OF BIRTH:  1928/07/18   DATE OF PROCEDURE:  11/24/2002  DATE OF DISCHARGE:                              CARDIAC CATHETERIZATION   CARDIOLOGIST:  Christy Sartorius, M.D.   PRIMARY CARE PHYSICIAN:  Trellis Moment, MD   INDICATIONS:  Ms. Alloway is a 77 year old woman with a history of  hypertension, dyslipidemia, and bladder cancer awaiting potential  cystectomy.  She also has known coronary artery disease status post single  vessel coronary artery bypass grafting in 1989 in [employer], [place].  She  apparently underwent a LIMA to the LAD at that time but no specifics are  available.  There is also potential report of subsequent intervention,  although again, no specifics are available.  She apparently had a Cardiolite  back in April that was negative for ischemia.  She presents now with  symptoms of significant dizziness which by description sounds potentially  like vertigo.  She has had near syncope and does have a history of chest  pain for which she takes sublingual nitroglycerin.  She is referred for the  assessment of progression of coronary disease prior to proceeding with  urological surgery.   PROCEDURES PERFORMED:  1. Left heart catheterization.  2. Selective coronary angiography.  3. Bypass graft angiography.  4. Left ventriculography.   ACCESS AND EQUIPMENT:  The area about the right femoral artery was  anesthetized with 1% lidocaine and a 6-French sheath was placed in the right  femoral artery via the modified Seldinger technique.  Standard preformed 6-  [place] and JR4 catheters were used for selective coronary angiography  and an angled pigtail catheter was used for left heart catheterization and  left  ventriculography.  All exchanges were made over a wire and the patient  tolerated the procedure well without immediate complications.   HEMODYNAMICS:  1. Left ventricle 165/25 mmHg.  2. Aorta 165/60 mmHg.   ANGIOGRAPHIC FINDINGS:  1. The left main coronary artery is free of significant flow limiting     coronary atherosclerosis.  2. The left anterior descending is a small to medium caliber vessel with a     large bifurcating proximal diagonal branch.  There is 40-50% ostial left     anterior descending stenosis.  The left anterior descending is occluded     at the mid vessel level.  The distal vessel fills with well-developed     collaterals from the right coronary system and the large diagonal branch.  3. The circumflex coronary artery has no flow limiting coronary     atherosclerosis.  4. The right coronary artery is dominant and medium in caliber.  There are     20% proximal  and mid vessel stenoses with other minor luminal     irregularities.  5. The LIMA to the LAD is patent, but essentially fills a septal perforator     as a left anterior descending appears to be occluded proximal to the     graft insertion and just distal to it as well.  As described previously,     the distal left anterior descending fills fairly well by collaterals from     the right system and diagonal branch.   LEFT VENTRICULOGRAPHY:  Left ventriculography is performed in the RAO  projection revealing an ejection fraction of 60-65% with trace mitral  regurgitation.   DIAGNOSES:  1. Occluded mid left anterior descending with good distal filling by well-     developed collaterals from the right coronary and diagonal branches.  The     left internal mammary artery to the left anterior descending is patent,     but essentially just fills a septal perforator.  No other major     obstructive stenoses are noted.  2. Left ventricular ejection fraction of 60-65% with trace mitral     regurgitation.    RECOMMENDATIONS:  Would continue aggressive risk factor modification and  medical therapy.                                                Satira Sark, M.D. [facility]    SGM/MEDQ  D:  11/24/2002  T:  11/25/2002  Job:  [id number]

## 2010-08-23 NOTE — Consult Note (Signed)
NAME:  [name] [name], [name] [name]                            ACCOUNT NO.:  [id number]   MEDICAL RECORD NO.:  [id number]                   PATIENT TYPE:  INP   LOCATION:  [id number]                                 FACILITY:  [facility]   PHYSICIAN:  Christy Sartorius, M.D.                   DATE OF BIRTH:  November 02, 1928   DATE OF CONSULTATION:  11/23/2002  DATE OF DISCHARGE:                                   CONSULTATION   CHIEF COMPLAINT:  Near syncope.   HISTORY:  Ms. Bowar is a 77 year old white female with known history of  coronary disease who presents with several episodes of near syncope.  The  symptoms started about Monday two days prior to admission when she had  several fleeting episodes during the day.  These lasted two to three minutes  and they were described as sudden onset of a black sensation and profound  weakness.  She did not actually lose consciousness.  This continued to occur  overnight when she went to the bathroom.  She had no chest discomfort but  did have some subjective shortness of breath, no diaphoresis.  On Tuesday  she felt somewhat nauseous.  She presented to Dr. Ralene Muskrat office and was  promptly sent to the emergency room.  She has not had any recurrence of  symptoms during hospitalization although the patient does describe being  slightly dizzy.  She describes these episodes as a sensation of the room  going in circles and unable to get her bearings.  Again, she did not  actually lose consciousness.  She denies any seizure-like activity.  No loss  of bladder or bowel function, no visual changes, no prodromal symptoms.  She  has a history of stable angina for which she uses approximately five  nitroglycerin per month and some dyspnea on exertion.  She has diffuse  arthralgias but review of systems otherwise noncontributory.  At [facility], she has undergone a CT of the head showing old lacunar strokes.  Cardiac enzymes have been negative and an ECG has shown no acute  changes.   PAST MEDICAL HISTORY:  1. Notable for a history of coronary disease status post myocardial     infarction and single-vessel bypass in 1989.  She subsequently had     percutaneous intervention, last intervention 1992, residual disease of     the single-vessel which was not treated due to tortuosity.  She has had a     Cardiolite scan performed in [place], [place] earlier this year by Dr.     Kenton Kingfisher at [facility] which reportedly showed significant     ischemia, normal LV function.  Based on that she had been cleared for a     planned surgery of the bladder due to bladder cancer.  2. Bladder cancer awaiting cystectomy.  3. History of renal cell cancer status post right nephrectomy.  4. Hypertension.  5. Dyslipidemia.  6. Obesity.  7. Depression.  8. Chronic lumbar pain.   SOCIAL HISTORY:  The patient lives in [place] with son and his wife.  She  is a retired Marine scientist.  No history of alcohol or tobacco use.   FAMILY HISTORY:  Mother died at the age of 65 of colon cancer.  Father died  at the age of 6 of myocardial infarction.  One brother deceased myocardial  infarction at 61.  One brother alive with a history of coronary disease and  CABG in his 70s.  One sister alive with a history of renal cell cancer.   ALLERGIES:  PENICILLIN and SULFA which cause hives.   MEDICATIONS:  Medications at home include Lexapro 10 mg q.h.s., Ditropan XL  30 mg daily, captopril 25 mg daily, hydrocodone/APAP 5/500 mg as needed, and  nitroglycerin as needed.  During hospitalization, she has also been placed  on aspirin 325 mg daily, Altace 5 mg daily, and Lovenox 80 q.12 h.   PHYSICAL EXAMINATION:  GENERAL:  On examination, she is an overweight white  female in no acute distress.  VITAL SIGNS:  Temperature 97.6, pulse 62, blood pressure 137/72,  respirations 16.  O2 saturation 96% on room air.  HEENT:  Pupils equal, round, reactive to light.  Extraocular muscles are  intact.  Oropharynx shows no lesions.  NECK:  Supple.  No adenopathy.  No bruits.  HEART:  Reveals a regular rate without murmurs.  LUNGS:  Clear to auscultation bilaterally.  ABDOMEN:  Soft, nontender.  EXTREMITIES:  No significant edema.  Peripheral pulses are 1+ and equal  bilaterally.  Motor strength is 5/5.  Sensory is intact bilaterally.   LABORATORY AND ACCESSORY DATA:  ECG is sinus bradycardia at 60.  No acute  ischemic changes.   Laboratory data shows a white count of 7.1, hemoglobin 14.1, hematocrit  40.9, platelets 229.  Sodium 136, potassium 4.1, chloride 105, bicarb 27,  BUN 15, creatinine 1, glucose 99.  Cholesterol is 232, triglycerides 206,  HDL 38, LDL 153.   ASSESSMENT AND PLAN:  Ms. Witz is a 77 year old female with a history of  coronary artery disease as well as bladder cancer awaiting a cystectomy who  presents with vertigo and near syncope of unclear etiology.  A head scan did  not show acute hemorrhagic stroke.  It may be prudent to obtain an MRI to  rule out ischemic stroke.  We will also check a carotid ultrasound to rule  out stenosis here.  Alternatively, the patient may have inner ear problem  consistent with the vertigo.  The patient does not appear to be orthostatic  although this is certainly a consideration.  In addition, coronary artery  disease and ischemia is a strong possibility.  The patient has not had an  assessment of her coronary anatomy in some time and while a Cardiolite study  in April of this year suggested no significant ischemia, we certainly cannot  rule out balanced ischemia and underlying severe disease.  I have discussed  treatment options and further assessment with the patient and her daughter  and we have agreed to proceed with cardiac catheterization to redefine her  anatomy and rule out the progression of disease as potential cause of her symptoms.  In the interim will continue to follow her closely, will advance  her antihypertensives  as well as initiate therapy with a statin given her  significant dyslipidemia.  Should cardiac workup prove unrevealing, a formal  assessment by a neurology service may be beneficial.                                               Christy Sartorius, M.D.    NG/MEDQ  D:  11/23/2002  T:  11/23/2002  Job:  [id number]   cc:   Marshall Cork. Jeffie Pollock, M.D.  [facility]. [address] [place]  [place] [address]  Fax: [phone]   Scarlett Presto, M.D.  Fax: [phone]   Benito Mccreedy, M.D.  [place]. [place]  [place] [address]  Fax: [phone]

## 2010-08-23 NOTE — Discharge Summary (Signed)
NAME:  [name] [name], [name] [name] NO.:  [id number]   MEDICAL RECORD NO.:  [id number]                   PATIENT TYPE:  OIB   LOCATION:  6527                                 FACILITY:  [facility]   PHYSICIAN:  Satira Sark, M.D. [facility]        DATE OF BIRTH:  March 14, 1929   DATE OF ADMISSION:  11/24/2002  DATE OF DISCHARGE:  11/25/2002                           DISCHARGE SUMMARY - REFERRING   DISCHARGE DIAGNOSES:  1. Admitted with presyncopal episodes.  History of coronary artery disease,     status post coronary artery bypass graft surgery x1 in 1989, status post     stenting x2, last one in 1992.  Cardiac enzymes negative.  Electrocardiogram shows no acute changes.  Cardiolite in April 2004 showed  no significant ischemia; however, the patient has not undergone cardiac  catheterization to rule out progression of disease in quite some time and is  scheduled for a left heart catheterization this hospitalization.  1. Presyncopal and hospital experience suggestive of possible vertigo.   SECONDARY DIAGNOSIS:  1. History of coronary artery disease, status post myocardial infarction     with coronary artery bypass graft surgery x1 in 1989.  Subsequent     percutaneous intervention, last intervention in 1992.  She has residual     disease in a single vessel, not treated due to tortuosity.  Cardiolite     study in [place], [place] earlier this year by Dr. Kenton Kingfisher, in an [place]     heart group, reportedly showed significant ischemia was reportedly     negative for ischemia and showed normal left ventricular function.  2. Recent diagnosis of bladder cancer, awaiting cystectomy.  3. History of transitional cell cancer, status post right nephrectomy,     August 2000.  4. Hypertension.  5. Significant dyslipidemia.  6. Obesity.  7. Depression.  8. Chronic lumbar pain.  33. Strong family history of coronary artery disease.   PROCEDURE:  1. November 24, 2002 - left heart  catheterization.  Study showed that the left     main is free of flow-limiting coronary atherosclerosis.  The left     anterior descending is a small to medium caliber vessel.  There is a 40-     50% ostial lesion.  The left anterior descending is occluded at mid     point.  The distal vessel fills with well-developed collaterals from the     right coronary and from a large diagonal branch.  Left circumflex     coronary artery has no flow limiting coronary atherosclerosis.  The right     coronary artery is dominant and medium in caliber, 20% proximal, 20% mid     point stenoses.  The left internal mammary artery graft to the LAD is     patent but essentially fills a septal perforator as the left anterior     descending appears to be occluded proximal to graft insertion  and just     distal to it as well.  Left ventriculography shows a 60-65% ejection     fraction with trace mitral regurgitation.  The patient tolerated the     procedure well and was scheduled for discharge the following day.   DISCHARGE DISPOSITION:  [name] [name] is ready for discharge August 20.   Post procedure day #1.  After undergoing successful left heart  catheterization, the plan after catheterization was to advance her  hypertensives as well as initiate statin therapy.  Also to obtain a carotid  duplex ultrasound as an outpatient and also workup for possible  vertigo/inner ear dysfunction as an outpatient.  In the post procedure  period, she complains of left shoulder pain where our camera had hit her.  Apparently on exam her left shoulder was mildly tender with no effusion and  no ecchymoses.  An x-ray of the left shoulder showed no evidence of fracture  or dislocation.  The right groin catheterization site shows no evidence of  hematoma.  There is no bruit.  The patient goes home with the following  medications:   DISCHARGE MEDICATIONS:  1. Enteric-coated aspirin daily.  2. Plavix 75 mg daily.  3. Lipitor 20 mg  daily at bedtime.  4. Lopressor 50 mg 1-1/2 tablets in the evening.  5. Vioxx 25 mg daily for three days.  6. Altace 5 mg daily.  7. Nitroglycerin 0.4 mg one tablet sublingually every five minutes with     three doses as needed for chest pain.  For pain in the catheterization     site Tylenol 325 mg one to two tablets p.o. q.4-6h. p.r.n. pain.   ACTIVITY:  She is to walk daily to keep up her strength.  She is asked not  to lift any heavy weight nor to drive nor engage in sexual intercourse for  the next two days.  [name] [name] may shower.   DIET:  Low sodium, low cholesterol diet.   FOLLOW UP:  She is to call Dr. Baird Cancer office at [phone] if her  catheterization site swells or becomes more painful.  She will see Dr.  Wilhemina Cash in followup Friday, September 3, at 10:15 a.m. at [place] office in  [place].   BRIEF HISTORY:  [name] [name] is a 77 year old female with known history of  coronary artery disease. She presents to [facility] on  August 17 with several episodes of presyncope.  Her symptoms started about  two days prior to this admission when she had several episodes during the  day.  They lasted two to three minutes and they were described as sudden  onset although they lacked a blacking out sensation, and profound weakness.  She did not actually lose consciousness.  One episode occurred when she got  up to go to the bathroom at night.  She has had no discomfort with this but  has some subjective shortness of breath, no diaphoresis.  She presented to  Dr. Ralene Muskrat office on Tuesday, August 17, and was probably sent to the  emergency room.  She has not had any recurrence of symptoms during this  hospitalization although the patient describes the fact that she feels the  room is going in circles around her and has some dizziness at rest lying in  the hospital bed.  Once again she has actually losing consciousness.  She denies any seizure-like activity.  She does  have a history of stable angina.  She takes approximately five nitroglycerin a  month.  She does have some  dyspnea on exertion.  She has some diffuse arthralgias but otherwise her  review of systems is essentially noncontributory.   On admission to [facility] she had a computerized tomogram of the head  which showed the presence of old lacunar strokes.  Admission cardiac enzymes  are negative and electrocardiogram shows no acute changes.  However, due to  the fact that this patient has had coronary artery bypass graft surgery in  1989 and has not had her coronary anatomy assessed recently, we will proceed  with a left heart catheterization.   HOSPITAL COURSE:  After admission to [facility] on August 16 with  complaint of presyncope, blacking out episodes and dizziness.  The patient  was seen in consultation by Dr. Lyndel Safe because she has a history of coronary  artery disease status post myocardial infarction in 1989 and one-vessel  bypass at the time of her heart attack.  He thought that left heart  catheterization would be the best approach.  She underwent this study on  August 19.  The study showed that the bypass graft to the LAD septal was  patent but that the LAD both proximal and distal to this anastomosis was  occluded.  There was no intervention done at the time of the  catheterization.  The detailed results have been dictated above.  The  patient has been placed on a more comprehensive post procedure medical  therapy and she has the following plan:   She will have carotid duplex ultrasound as an outpatient through Dr. Trellis Moment, also if necessary a neurology consult to rule out inner ear  dysfunction/vertigo.   LABORATORY DATA:  Serum electrolytes on August 17 - sodium 136, potassium  4.2, chloride 105, bicarbonate 27, glucose 99, BUN 15, creatinine 1.  Fasting lipid profile cholesterol 232, triglycerides 206, HDL cholesterol  38, LDL cholesterol 153.  Cardiac  enzymes on August 17 at 1730 hours - CK  was 89, CK-MB 1.9, troponin 1.01.  Cardiac panel on November 23, 2002, 0500  hours, CK 89, CK-MB 1.9, troponin 1.02.         Sueanne Margarita, P.A.                    Satira Sark, M.D. [facility]    GM/MEDQ  D:  11/25/2002  T:  11/26/2002  Job:  [id number]   cc:   Trellis Moment, MD  [place] [address]  Fax: [phone]   Diona Foley, M.D.   Scarlett Presto, M.D.  Fax: [phone]

## 2010-08-23 NOTE — Procedures (Signed)
NAME:  [name], [name]                  ACCOUNT NO.:  [id number]   MEDICAL RECORD NO.:  [id number]          PATIENT TYPE:  REC   LOCATION:  RAD                           FACILITY:  [facility]   PHYSICIAN:  Scarlett Presto, M.D.   DATE OF BIRTH:  [birth year]   DATE OF PROCEDURE:  DATE OF DISCHARGE:                                    STRESS TEST   HISTORY:  [name] is a 77 year old female with a history of coronary artery  disease status post one vessel coronary artery bypass grafting with a LIMA  to her LAD in 1989.  She was most recently evaluated with a cardiac  catheterization in 2004 revealing severe disease in her proximal and distal  LAD, distal to the graft insertion.  There was a normal ejection fraction at  that time.  The patient now returns with complaints of chest discomfort.   BASELINE DATA:  EKG reveals a sinus bradycardia at 43 beats/minute with some  nonspecific ST abnormalities.  Blood pressure is 142/70.   PROCEDURE:  Stress test.   DESCRIPTION OF PROCEDURE:  There was 43 mg of adenosine infused over a 4  minute protocol with Myoview injected at three minutes.  The patient  reported chest discomfort and headache, which resolved in recovery.  On  electrocardiogram her heart rate increased minimally with adenosine.  No  ischemia was noted.  No arrhythmias were noted.   The final images and results are pending M.D. review.      AB/MEDQ  D:  06/13/2004  T:  06/13/2004  Job:  [id number]

## 2010-08-23 NOTE — Discharge Summary (Signed)
NAME:  Napoles, Alvis                  ACCOUNT NO.:  [id number]   MEDICAL RECORD NO.:  [id number]          PATIENT TYPE:  OUT   LOCATION:  MRI                          FACILITY:  [facility]   PHYSICIAN:  Leeroy Cha, M.D.   DATE OF BIRTH:  Aug 28, 1928   DATE OF ADMISSION:  05/24/2007  DATE OF DISCHARGE:  05/24/2007                               DISCHARGE SUMMARY   The patient was admitted February 20, discharged February 27.   ADMISSION DIAGNOSIS:  Rule out right parietal tumor.   FINAL DIAGNOSIS:  Right parietal mass.  The patient was admitted to the  neurological service because seizure associated with weakness of the  left arm.  X-rays show she has a lesion in the right parietal area.  It  was negative for stroke.  To rule out a possibility of infection versus  tumor, she was brought to the hospital for biopsy.  Laboratory normal.   COURSE IN THE HOSPITAL:  The patient was taken to surgery on February 18  and a right parietal craniotomy with open biopsy was done.  The  diagnosis initially was mostly infiltration but no evidence of any  tumor.  The patient was kept in the ICU and eventually was discharged by  Dr. Luiz Ochoa.  The patient is going to be followed by me in my office.   CONDITION ON DISCHARGE:  Stable.   MEDICATION:  Vicodin for pain.   DIET:  Regular.   Not to drive.   FOLLOW UP:  She will be seen by me in my office to have the staples out  and later on by Dr. Leonie Man           ______________________________  Leeroy Cha, M.D.     EB/MEDQ  D:  06/22/2007  T:  06/22/2007  Job:  [id number]

## 2010-12-17 ENCOUNTER — Other Ambulatory Visit: Payer: Self-pay | Admitting: Cardiovascular Disease

## 2010-12-25 LAB — URINALYSIS, ROUTINE W REFLEX MICROSCOPIC
Glucose, UA: NEGATIVE
Hgb urine dipstick: NEGATIVE
Protein, ur: NEGATIVE
pH: 5.5

## 2010-12-25 LAB — CBC
HCT: 38.2
HCT: 39.7
Hemoglobin: 13.1
MCHC: 34.2
MCV: 88.3
Platelets: 227
RDW: 15.9 — ABNORMAL HIGH
RDW: 16.2 — ABNORMAL HIGH
WBC: 8.4

## 2010-12-25 LAB — COMPREHENSIVE METABOLIC PANEL
AST: 23
Albumin: 3.5
BUN: 23
BUN: 28 — ABNORMAL HIGH
CO2: 24
Calcium: 9
Calcium: 9.1
Chloride: 110
Creatinine, Ser: 1.7 — ABNORMAL HIGH
GFR calc Af Amer: 35 — ABNORMAL LOW
GFR calc non Af Amer: 26 — ABNORMAL LOW
Glucose, Bld: 124 — ABNORMAL HIGH
Total Bilirubin: 0.6
Total Protein: 6.7
Total Protein: 6.7

## 2010-12-25 LAB — DIFFERENTIAL
Basophils Relative: 1
Lymphs Abs: 2.5
Monocytes Relative: 9
Neutro Abs: 3.6
Neutrophils Relative %: 52

## 2010-12-25 LAB — PROTIME-INR
INR: 1
INR: 1
Prothrombin Time: 13
Prothrombin Time: 13.3

## 2010-12-25 LAB — URINE MICROSCOPIC-ADD ON

## 2010-12-25 LAB — URINE CULTURE: Colony Count: 6000

## 2010-12-25 LAB — HEMOGLOBIN A1C: Mean Plasma Glucose: 147

## 2010-12-25 LAB — CK TOTAL AND CKMB (NOT AT ARMC)
CK, MB: 1.8
Relative Index: INVALID
Total CK: 90

## 2010-12-25 LAB — APTT: aPTT: 26

## 2010-12-25 LAB — TROPONIN I: Troponin I: 0.02

## 2010-12-26 LAB — CARDIAC PANEL(CRET KIN+CKTOT+MB+TROPI)
CK, MB: 1.6
Troponin I: <0.01

## 2010-12-26 LAB — CBC
MCV: 88.6
Platelets: 267
RDW: 15.5
WBC: 11.7 — ABNORMAL HIGH

## 2010-12-26 LAB — LIPID PANEL
HDL: 30 — ABNORMAL LOW
LDL Cholesterol: 176 — ABNORMAL HIGH
Triglycerides: 147
VLDL: 29

## 2010-12-26 LAB — I-STAT 8, (EC8 V) (CONVERTED LAB)
BUN: 23
Bicarbonate: 26.1 — ABNORMAL HIGH
Glucose, Bld: 119 — ABNORMAL HIGH
Sodium: 138
TCO2: 27
pCO2, Ven: 47
pH, Ven: 7.352 — ABNORMAL HIGH

## 2010-12-26 LAB — LUPUS ANTICOAGULANT PANEL: PTT Lupus Anticoagulant: 38.6 (ref 36.3–48.8)

## 2010-12-26 LAB — AFB CULTURE WITH SMEAR (NOT AT ARMC): Acid Fast Smear: NONE SEEN

## 2010-12-26 LAB — PROTEIN C, TOTAL: Protein C, Total: 103 % (ref 70–140)

## 2010-12-26 LAB — ANA: Anti Nuclear Antibody(ANA): NEGATIVE

## 2010-12-26 LAB — CSF CELL COUNT WITH DIFFERENTIAL
Lymphs, CSF: 94 — ABNORMAL HIGH
RBC Count, CSF: 360 — ABNORMAL HIGH
Segmented Neutrophils-CSF: 1
WBC, CSF: 11 — ABNORMAL HIGH

## 2010-12-26 LAB — BETA-2-GLYCOPROTEIN I ABS, IGG/M/A
Beta-2-Glycoprotein I IgA: 11 U/mL (ref <10)
Beta-2-Glycoprotein I IgM: 8 U/mL (ref <10)

## 2010-12-26 LAB — ANTITHROMBIN III: AntiThromb III Func: 119 (ref 76–126)

## 2010-12-26 LAB — SEDIMENTATION RATE: Sed Rate: 30 — ABNORMAL HIGH

## 2010-12-26 LAB — CRYPTOCOCCAL ANTIGEN, CSF: Crypto Ag: NEGATIVE

## 2010-12-26 LAB — FACTOR 5 LEIDEN

## 2010-12-26 LAB — COMPLEMENT, TOTAL: Compl, Total (CH50): 62 U/mL (ref 31–66)

## 2010-12-26 LAB — CSF CULTURE W GRAM STAIN

## 2010-12-26 LAB — C3 COMPLEMENT: C3 Complement: 181

## 2010-12-26 LAB — PROTEIN S ACTIVITY: Protein S Activity: 122 % (ref 69–129)

## 2010-12-26 LAB — PROTEIN S, TOTAL: Protein S Ag, Total: 185 % — ABNORMAL HIGH (ref 70–140)

## 2010-12-26 LAB — POCT I-STAT CREATININE: Operator id: 257131

## 2010-12-27 LAB — TYPE AND SCREEN
ABO/RH(D): A NEG
Antibody Screen: NEGATIVE

## 2010-12-27 LAB — CBC
Hemoglobin: 13.2
MCHC: 33.5
MCV: 89.4
RBC: 4.41
WBC: 8.8

## 2010-12-27 LAB — BASIC METABOLIC PANEL
CO2: 25
Calcium: 8.8
Chloride: 106
Creatinine, Ser: 1.4 — ABNORMAL HIGH
GFR calc Af Amer: 44 — ABNORMAL LOW
Sodium: 139

## 2010-12-27 LAB — FUNGUS CULTURE W SMEAR: Fungal Smear: NONE SEEN

## 2010-12-27 LAB — AFB CULTURE WITH SMEAR (NOT AT ARMC): Acid Fast Smear: NONE SEEN

## 2010-12-27 LAB — TISSUE CULTURE: Gram Stain: NONE SEEN

## 2010-12-27 LAB — ABO/RH: ABO/RH(D): A NEG

## 2011-01-13 ENCOUNTER — Encounter: Payer: Self-pay | Admitting: *Deleted

## 2011-01-13 ENCOUNTER — Encounter: Payer: Self-pay | Admitting: Cardiovascular Disease

## 2011-01-14 ENCOUNTER — Ambulatory Visit (INDEPENDENT_AMBULATORY_CARE_PROVIDER_SITE_OTHER): Payer: Medicare Other | Admitting: Cardiovascular Disease

## 2011-01-14 ENCOUNTER — Encounter: Payer: Self-pay | Admitting: Cardiovascular Disease

## 2011-01-14 VITALS — BP 138/78 | HR 73 | Ht 62.0 in | Wt 179.0 lb

## 2011-01-14 DIAGNOSIS — E782 Mixed hyperlipidemia: Secondary | ICD-10-CM

## 2011-01-14 DIAGNOSIS — Z951 Presence of aortocoronary bypass graft: Secondary | ICD-10-CM

## 2011-01-14 DIAGNOSIS — I10 Essential (primary) hypertension: Secondary | ICD-10-CM

## 2011-01-14 DIAGNOSIS — I2510 Atherosclerotic heart disease of native coronary artery without angina pectoris: Secondary | ICD-10-CM

## 2011-01-14 MED ORDER — NITROGLYCERIN 0.4 MG SL SUBL: 0.4000 mg | SUBLINGUAL_TABLET | SUBLINGUAL | Status: DC | PRN

## 2011-01-14 NOTE — Assessment & Plan Note (Signed)
NO angina  Needs refill on nitro.  Would need myovue before any hernia surgery

## 2011-01-14 NOTE — Assessment & Plan Note (Signed)
Well controlled.  Continue current medications and low sodium Dash type diet.    

## 2011-01-14 NOTE — Assessment & Plan Note (Addendum)
Continue statin has more recent labs with Delphina Cahill who folows lipids Lab Results  Component Value Date   [facility]  Value: 176        Total Cholesterol/HDL:CHD Risk Coronary Heart Disease Risk Table                     Men   Women  1/2 Average Risk   3.4   3.3* 04/22/2007

## 2011-01-14 NOTE — Patient Instructions (Signed)
Your physician wants you to follow-up in: [place] will receive a reminder letter in the mail two months in advance. If you don't receive a letter, please call our office to schedule the follow-up appointment.

## 2011-01-14 NOTE — Progress Notes (Signed)
[name] [name] has a distant history of [place] to LAD in [place] in 1989. I believe she subsequenlty required PCI of the native RCA. Cath here in 2004 showed no flow limiting disease in the RCA or Circ and a patent LIMA to the LAD. She has not had any SSCP. She has a stable lipoma over the sternum and a large abdominal wall hernia from a previous nephrectomy. She has seen [name] for her remaining kidney and her Cr is apparantly ok. We will get her blood work from [name] done last week. She thinks her LDL was 111 and on 20mg  of Crestor I would not increase her to 40mg .   Needs refill on nitro.  Hernia from previous right nephrectomy bothering her a bit.  Cr around 1.6  ROS: Denies fever, malais, weight loss, blurry vision, decreased visual acuity, cough, sputum, SOB, hemoptysis, pleuritic pain, palpitaitons, heartburn, abdominal pain, melena, lower extremity edema, claudication, or rash.  All other systems reviewed and negative  General: Affect appropriate Healthy:  appears stated age 77: normal Neck supple with no adenopathy JVP normal no bruits no thyromegaly Lungs clear with no wheezing and good diaphragmatic motion Heart:  S1/S2 no murmur,rub, gallop or click PMI normal Abdomen: benighn, BS positve,  Mild tenderness over right fland by hernia and  nephrectomyu scar, no AAA no bruit.  No HSM or HJR Distal pulses intact with no bruits No edema Neuro non-focal Skin warm and dry No muscular weakness   Current Outpatient Prescriptions  Medication Sig Dispense Refill  . aspirin 81 MG tablet Take 81 mg by mouth daily.        . CRESTOR 20 MG tablet Take 1 tablet by mouth Daily.      Marland Kitchen HYDROcodone-acetaminophen (LORTAB) 7.5-500 MG per tablet as needed.      Marland Kitchen l-methylfolate-B6-B12 (METANX) 3-35-2 MG TABS Take 1 tablet by mouth daily.        Marland Kitchen levETIRAcetam (KEPPRA) 250 MG tablet Take 1 tablet by mouth Daily.      Marland Kitchen lisinopril (PRINIVIL,ZESTRIL) 10 MG tablet TAKE ONE TABLET BY MOUTH EVERY DAY  90  tablet  3  . NON FORMULARY Levetercot Once daily         Allergies  Review of patient's allergies indicates not on file.  Electrocardiogram:  NSR 72 LAD pulmonary disease pattern nonspecific lateral T wave changes  Assessment and Plan

## 2011-03-29 ENCOUNTER — Ambulatory Visit (INDEPENDENT_AMBULATORY_CARE_PROVIDER_SITE_OTHER): Payer: Medicare Other

## 2011-03-29 DIAGNOSIS — J111 Influenza due to unidentified influenza virus with other respiratory manifestations: Secondary | ICD-10-CM

## 2011-03-29 DIAGNOSIS — R509 Fever, unspecified: Secondary | ICD-10-CM

## 2011-04-29 ENCOUNTER — Inpatient Hospital Stay (HOSPITAL_COMMUNITY)
Admission: AD | Admit: 2011-04-29 | Discharge: 2011-05-03 | DRG: 690 | Disposition: A | Discharge status: Home Health | Payer: Medicare Other | Source: Ambulatory Visit | Attending: Internal Medicine | Admitting: Internal Medicine

## 2011-04-29 DIAGNOSIS — E669 Obesity, unspecified: Secondary | ICD-10-CM | POA: Diagnosis present

## 2011-04-29 DIAGNOSIS — Z905 Acquired absence of kidney: Secondary | ICD-10-CM

## 2011-04-29 DIAGNOSIS — Z951 Presence of aortocoronary bypass graft: Secondary | ICD-10-CM

## 2011-04-29 DIAGNOSIS — N179 Acute kidney failure, unspecified: Secondary | ICD-10-CM | POA: Diagnosis present

## 2011-04-29 DIAGNOSIS — N184 Chronic kidney disease, stage 4 (severe): Secondary | ICD-10-CM | POA: Diagnosis present

## 2011-04-29 DIAGNOSIS — H269 Unspecified cataract: Secondary | ICD-10-CM | POA: Diagnosis present

## 2011-04-29 DIAGNOSIS — R42 Dizziness and giddiness: Secondary | ICD-10-CM | POA: Diagnosis present

## 2011-04-29 DIAGNOSIS — K859 Acute pancreatitis without necrosis or infection, unspecified: Secondary | ICD-10-CM | POA: Diagnosis present

## 2011-04-29 DIAGNOSIS — E860 Dehydration: Secondary | ICD-10-CM | POA: Diagnosis present

## 2011-04-29 DIAGNOSIS — R109 Unspecified abdominal pain: Secondary | ICD-10-CM | POA: Diagnosis present

## 2011-04-29 DIAGNOSIS — Z7982 Long term (current) use of aspirin: Secondary | ICD-10-CM

## 2011-04-29 DIAGNOSIS — G40909 Epilepsy, unspecified, not intractable, without status epilepticus: Secondary | ICD-10-CM | POA: Diagnosis present

## 2011-04-29 DIAGNOSIS — E782 Mixed hyperlipidemia: Secondary | ICD-10-CM | POA: Diagnosis present

## 2011-04-29 DIAGNOSIS — D649 Anemia, unspecified: Secondary | ICD-10-CM | POA: Diagnosis present

## 2011-04-29 DIAGNOSIS — Z79899 Other long term (current) drug therapy: Secondary | ICD-10-CM

## 2011-04-29 DIAGNOSIS — G8929 Other chronic pain: Secondary | ICD-10-CM | POA: Diagnosis present

## 2011-04-29 DIAGNOSIS — F419 Anxiety disorder, unspecified: Secondary | ICD-10-CM | POA: Diagnosis present

## 2011-04-29 DIAGNOSIS — Z8551 Personal history of malignant neoplasm of bladder: Secondary | ICD-10-CM

## 2011-04-29 DIAGNOSIS — C679 Malignant neoplasm of bladder, unspecified: Secondary | ICD-10-CM | POA: Diagnosis present

## 2011-04-29 DIAGNOSIS — N390 Urinary tract infection, site not specified: Principal | ICD-10-CM | POA: Diagnosis present

## 2011-04-29 DIAGNOSIS — I10 Essential (primary) hypertension: Secondary | ICD-10-CM | POA: Diagnosis present

## 2011-04-29 DIAGNOSIS — H3530 Unspecified macular degeneration: Secondary | ICD-10-CM | POA: Diagnosis present

## 2011-04-29 DIAGNOSIS — D631 Anemia in chronic kidney disease: Secondary | ICD-10-CM | POA: Diagnosis present

## 2011-04-29 DIAGNOSIS — R531 Weakness: Secondary | ICD-10-CM | POA: Diagnosis present

## 2011-04-29 DIAGNOSIS — I129 Hypertensive chronic kidney disease with stage 1 through stage 4 chronic kidney disease, or unspecified chronic kidney disease: Secondary | ICD-10-CM | POA: Diagnosis present

## 2011-04-29 DIAGNOSIS — I2510 Atherosclerotic heart disease of native coronary artery without angina pectoris: Secondary | ICD-10-CM | POA: Diagnosis present

## 2011-04-29 DIAGNOSIS — E785 Hyperlipidemia, unspecified: Secondary | ICD-10-CM | POA: Diagnosis present

## 2011-04-29 DIAGNOSIS — R5381 Other malaise: Secondary | ICD-10-CM | POA: Diagnosis present

## 2011-04-29 DIAGNOSIS — F411 Generalized anxiety disorder: Secondary | ICD-10-CM | POA: Diagnosis present

## 2011-04-29 LAB — URINE MICROSCOPIC-ADD ON

## 2011-04-29 LAB — CBC
MCH: 29.9 pg (ref 26.0–34.0)
MCHC: 32.7 g/dL (ref 30.0–36.0)
MCV: 91.4 fL (ref 78.0–100.0)
Platelets: 136 10*3/uL — ABNORMAL LOW (ref 150–400)
RDW: 15.9 % — ABNORMAL HIGH (ref 11.5–15.5)

## 2011-04-29 LAB — URINALYSIS, ROUTINE W REFLEX MICROSCOPIC
Bilirubin Urine: NEGATIVE
Specific Gravity, Urine: 1.025 (ref 1.005–1.030)
Urobilinogen, UA: 0.2 mg/dL (ref 0.0–1.0)

## 2011-04-29 LAB — COMPREHENSIVE METABOLIC PANEL
AST: 23 U/L (ref 0–37)
Albumin: 3.6 g/dL (ref 3.5–5.2)
Calcium: 9.3 mg/dL (ref 8.4–10.5)
Creatinine, Ser: 3.51 mg/dL — ABNORMAL HIGH (ref 0.50–1.10)
GFR calc non Af Amer: 11 mL/min — ABNORMAL LOW (ref >90)
Total Protein: 7.3 g/dL (ref 6.0–8.3)

## 2011-04-29 LAB — LIPASE, BLOOD: Lipase: 70 U/L — ABNORMAL HIGH (ref 11–59)

## 2011-04-29 LAB — CARDIAC PANEL(CRET KIN+CKTOT+MB+TROPI)
CK, MB: 2.9 ng/mL (ref 0.3–4.0)
Relative Index: 1.7 (ref 0.0–2.5)
Total CK: 166 U/L (ref 7–177)
Troponin I: <0.3 ng/mL (ref <0.30)

## 2011-04-29 MED ORDER — ACETAMINOPHEN 650 MG RE SUPP: 650.0000 mg | Freq: Four times a day (QID) | RECTAL | Status: DC | PRN

## 2011-04-29 MED ORDER — ALPRAZOLAM 0.25 MG PO TABS: 0.2500 mg | ORAL_TABLET | Freq: Two times a day (BID) | ORAL | Status: DC | PRN

## 2011-04-29 MED ORDER — ACETAMINOPHEN 325 MG PO TABS: 650.0000 mg | ORAL_TABLET | Freq: Four times a day (QID) | ORAL | Status: DC | PRN

## 2011-04-29 MED ORDER — HYDROMORPHONE HCL PF 1 MG/ML IJ SOLN: 0.5000 mg | INTRAMUSCULAR | Status: DC | PRN

## 2011-04-29 MED ORDER — ONDANSETRON HCL 4 MG/2ML IJ SOLN
4.0000 mg | Freq: Four times a day (QID) | INTRAMUSCULAR | Status: DC | PRN
  Administered 2011-04-30: 4 mg via INTRAVENOUS
  Filled 2011-04-29: qty 2

## 2011-04-29 MED ORDER — SODIUM CHLORIDE 0.9 % IV SOLN
INTRAVENOUS | Status: AC
  Administered 2011-04-29 – 2011-05-02 (×6): via INTRAVENOUS

## 2011-04-29 MED ORDER — TEMAZEPAM 15 MG PO CAPS
15.0000 mg | ORAL_CAPSULE | Freq: Every day | ORAL | Status: DC
  Administered 2011-04-29 – 2011-05-02 (×4): 15 mg via ORAL
  Filled 2011-04-29 (×4): qty 1

## 2011-04-29 MED ORDER — SODIUM CHLORIDE 0.9 % IJ SOLN
INTRAMUSCULAR | Status: AC
  Filled 2011-04-29: qty 3

## 2011-04-29 MED ORDER — HYDROCODONE-ACETAMINOPHEN 5-325 MG PO TABS
1.0000 | ORAL_TABLET | ORAL | Status: DC | PRN
  Administered 2011-04-29: 2 via ORAL
  Administered 2011-04-30 (×2): 1 via ORAL
  Administered 2011-04-30: 2 via ORAL
  Administered 2011-04-30: 1 via ORAL
  Administered 2011-04-30 – 2011-05-02 (×6): 2 via ORAL
  Administered 2011-05-02 (×2): 1 via ORAL
  Administered 2011-05-03 (×2): 2 via ORAL
  Filled 2011-04-29: qty 1
  Filled 2011-04-29 (×2): qty 2
  Filled 2011-04-29: qty 1
  Filled 2011-04-29 (×7): qty 2
  Filled 2011-04-29: qty 1
  Filled 2011-04-29 (×2): qty 2

## 2011-04-29 MED ORDER — ALUM & MAG HYDROXIDE-SIMETH 200-200-20 MG/5ML PO SUSP: 30.0000 mL | Freq: Four times a day (QID) | ORAL | Status: DC | PRN

## 2011-04-29 MED ORDER — ONDANSETRON HCL 4 MG PO TABS: 4.0000 mg | ORAL_TABLET | Freq: Four times a day (QID) | ORAL | Status: DC | PRN

## 2011-04-29 MED ORDER — DOCUSATE SODIUM 100 MG PO CAPS
100.0000 mg | ORAL_CAPSULE | Freq: Two times a day (BID) | ORAL | Status: DC
  Administered 2011-04-29 – 2011-05-03 (×8): 100 mg via ORAL
  Filled 2011-04-29 (×8): qty 1

## 2011-04-29 MED ORDER — MAGNESIUM HYDROXIDE 400 MG/5ML PO SUSP: 30.0000 mL | Freq: Every day | ORAL | Status: DC | PRN

## 2011-04-29 NOTE — Progress Notes (Signed)
Paged Dr. Conley Canal to let her know results of EKG.

## 2011-04-30 ENCOUNTER — Inpatient Hospital Stay (HOSPITAL_COMMUNITY): Payer: Medicare Other

## 2011-04-30 DIAGNOSIS — N179 Acute kidney failure, unspecified: Secondary | ICD-10-CM | POA: Diagnosis present

## 2011-04-30 DIAGNOSIS — H269 Unspecified cataract: Secondary | ICD-10-CM | POA: Diagnosis present

## 2011-04-30 DIAGNOSIS — C679 Malignant neoplasm of bladder, unspecified: Secondary | ICD-10-CM | POA: Diagnosis present

## 2011-04-30 DIAGNOSIS — Z905 Acquired absence of kidney: Secondary | ICD-10-CM

## 2011-04-30 DIAGNOSIS — H3530 Unspecified macular degeneration: Secondary | ICD-10-CM | POA: Diagnosis present

## 2011-04-30 DIAGNOSIS — N184 Chronic kidney disease, stage 4 (severe): Secondary | ICD-10-CM | POA: Diagnosis present

## 2011-04-30 DIAGNOSIS — D649 Anemia, unspecified: Secondary | ICD-10-CM | POA: Diagnosis present

## 2011-04-30 DIAGNOSIS — N390 Urinary tract infection, site not specified: Secondary | ICD-10-CM | POA: Diagnosis present

## 2011-04-30 DIAGNOSIS — K859 Acute pancreatitis without necrosis or infection, unspecified: Secondary | ICD-10-CM | POA: Diagnosis present

## 2011-04-30 LAB — BASIC METABOLIC PANEL
CO2: 19 mEq/L (ref 19–32)
Calcium: 9.1 mg/dL (ref 8.4–10.5)
Creatinine, Ser: 3.44 mg/dL — ABNORMAL HIGH (ref 0.50–1.10)
GFR calc Af Amer: 13 mL/min — ABNORMAL LOW (ref >90)
GFR calc non Af Amer: 12 mL/min — ABNORMAL LOW (ref >90)
Sodium: 140 mEq/L (ref 135–145)

## 2011-04-30 LAB — LIPASE, BLOOD: Lipase: 59 U/L (ref 11–59)

## 2011-04-30 MED ORDER — LEVETIRACETAM 500 MG PO TABS
250.0000 mg | ORAL_TABLET | Freq: Every day | ORAL | Status: DC
  Administered 2011-04-30 – 2011-05-02 (×3): 250 mg via ORAL
  Filled 2011-04-30 (×4): qty 1

## 2011-04-30 MED ORDER — CIPROFLOXACIN IN D5W 200 MG/100ML IV SOLN
200.0000 mg | Freq: Two times a day (BID) | INTRAVENOUS | Status: DC
  Administered 2011-04-30 – 2011-05-01 (×4): 200 mg via INTRAVENOUS
  Filled 2011-04-30 (×6): qty 100

## 2011-04-30 MED ORDER — SODIUM CHLORIDE 0.9 % IV SOLN
INTRAVENOUS | Status: AC
  Filled 2011-04-30: qty 250

## 2011-04-30 MED ORDER — LEVETIRACETAM 250 MG PO TABS
250.0000 mg | ORAL_TABLET | Freq: Once | ORAL | Status: AC
  Administered 2011-04-30: 250 mg via ORAL
  Filled 2011-04-30: qty 1

## 2011-04-30 MED ORDER — L-METHYLFOLATE-B6-B12 3-35-2 MG PO TABS
1.0000 | ORAL_TABLET | Freq: Every day | ORAL | Status: DC
  Administered 2011-04-30 – 2011-05-02 (×3): 1 via ORAL
  Filled 2011-04-30 (×5): qty 1

## 2011-04-30 MED ORDER — NITROGLYCERIN 0.4 MG SL SUBL: 0.4000 mg | SUBLINGUAL_TABLET | SUBLINGUAL | Status: DC | PRN

## 2011-04-30 MED ORDER — ASPIRIN EC 81 MG PO TBEC
81.0000 mg | DELAYED_RELEASE_TABLET | Freq: Every day | ORAL | Status: DC
  Administered 2011-04-30 – 2011-05-02 (×3): 81 mg via ORAL
  Filled 2011-04-30 (×3): qty 1

## 2011-04-30 MED ORDER — IMIPENEM-CILASTATIN 250 MG IV SOLR
250.0000 mg | Freq: Two times a day (BID) | INTRAVENOUS | Status: DC
  Filled 2011-04-30 (×2): qty 250

## 2011-04-30 NOTE — H&P (Signed)
Hospital Admission Note Date: 04/30/2011  Patient name: [name] [name] Medical record number: [id number] Date of birth: 05/01/1928 Age: 78 y.o. Gender: female PCP: Wende Neighbors, MD, MD  Attending physician: Delfina Redwood, MD  Chief Complaint: can't walk  History of Present Illness:  [name] [name] is an 78 y.o. female with multiple medical problems who was directly admitted from Dr. Virgel Gess office. She has become progressively weaker and today was barely able to stand walk or get out of bed. Her appetite has been poor. She started feeling this way about a month ago when she was diagnosed and treated for influenza. Since then, she's become progressively weaker. She's not eating much. She's lost about 14 pounds. She is very concerned that she's had a recurrence of her cancer which apparently is bladder cancer. She has chronic abdominal pain. She has chronic dizziness which is been intermittent according to family members. Patient is a somewhat difficult historian, sometimes contradicting herself with regard to timing and details. Some of the history is augmented by family members. She's had no vomiting or diarrhea. She does feel depressed. She feels anxious and sometimes suffers panic attacks. She has had no dysuria. She had no significant change in orthostatic vital signs in the office. She has had no focal neurologic signs.  Past Medical History  Diagnosis Date  . Mixed hyperlipidemia   . 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 Essential hypertension, benign   . Postsurgical aortocoronary bypass status   . Bladder cancer   . Seizure   . Dizziness   . Obesity   . Back pain   . CAD (coronary artery disease)     Meds: Prescriptions prior to admission  Medication Sig Dispense Refill  . ALPRAZolam (XANAX) 0.5 MG tablet Take 0.125-0.5 mg by mouth daily as needed. For nerves      . aspirin EC 81 MG tablet Take 81 mg by mouth at bedtime.       . CRESTOR 20 MG tablet Take 1 tablet by mouth at bedtime.        . docusate sodium (COLACE) 100 MG capsule Take 200 mg by mouth at bedtime.      Marland Kitchen HYDROcodone-acetaminophen (LORTAB) 7.5-500 MG per tablet Take 1 tablet by mouth every 6 (six) hours as needed. For pain       . l-methylfolate-B6-B12 (METANX) 3-35-2 MG TABS Take 1 tablet by mouth at bedtime.       . levETIRAcetam (KEPPRA) 250 MG tablet Take 1 tablet by mouth at bedtime.       Marland Kitchen lisinopril (PRINIVIL,ZESTRIL) 10 MG tablet       . Multiple Vitamins-Minerals (ICAPS PO) Take 1 tablet by mouth at bedtime.      . nitroGLYCERIN (NITROSTAT) 0.4 MG SL tablet Place 1 tablet (0.4 mg total) under the tongue every 5 (five) minutes as needed for chest pain.  25 tablet  12  . temazepam (RESTORIL) 15 MG capsule Take 15 mg by mouth at bedtime as needed. For sleep      . DISCONTD: lisinopril (PRINIVIL,ZESTRIL) 10 MG tablet TAKE ONE TABLET BY MOUTH EVERY DAY  90 tablet  3    Allergies: Penicillins and Sulfa antibiotics  Social history: She lives with her son and daughter-in-law. She does not smoke drink or have a drug history. She is a retired Marine scientist. She is widowed.  Family History  Problem Relation Age of Onset  . Stroke    . Coronary artery disease    . Cancer    .  Diabetes     Past Surgical History  Procedure Date  . Cataract extraction   . Coronary artery bypass graft   . Iridectomy    partial hysterectomy Right nephrectomy  Review of Systems: Systems reviewed and as per HPI, otherwise negative.  Physical Exam: Blood pressure 164/74, pulse 62, temperature 97.4 F (36.3 C), temperature source Oral, resp. rate 18, height 5\' 2"  (1.575 m), weight 77.1 kg (169 lb 15.6 oz), SpO2 95.00%. BP 164/74  Pulse 62  Temp(Src) 97.4 F (36.3 C) (Oral)  Resp 18  Ht 5\' 2"  (1.575 m)  Wt 77.1 kg (169 lb 15.6 oz)  BMI 31.09 kg/m2  SpO2 95%  General Appearance:    Alert, cooperative, weak appearing, appears stated age, overweight   Head:    Normocephalic, without obvious abnormality, atraumatic  Eyes:     PERRL, conjunctiva/corneas clear, EOM's intact, fundi    benign, both eyes  Ears:    Normal TM's and external ear canals, both ears  Nose:   Nares normal, septum midline, mucosa normal, no drainage    or sinus tenderness  Throat:   slightly dry mucous membranes   Neck:   Supple, symmetrical, trachea midline, no adenopathy;    thyroid:  no enlargement/tenderness/nodules; no carotid   bruit or JVD  Back:     Symmetric, no curvature, ROM normal, no CVA tenderness  Lungs:     Clear to auscultation bilaterally, respirations unlabored  Chest Wall:    No tenderness or deformity   Heart:    Regular rate and rhythm, S1 and S2 normal, no murmur, rub   or gallop  Breast Exam:    deferred   Abdomen:     Soft, upper abdominal tenderness, bowel sounds active all four quadrants,    no masses, no organomegaly  Genitalia:   deferred   Rectal:   deferred   Extremities:   Extremities normal, atraumatic, no cyanosis or edema  Pulses:   2+ and symmetric all extremities  Skin:   Skin color, texture, turgor normal, no rashes or lesions  Lymph nodes:   Cervical, supraclavicular, and axillary nodes normal  Neurologic:   CNII-XII intact, normal strength, sensation and reflexes    throughout     Psychiatric: Flat, sad affect  Lab results: Basic Metabolic Panel:  Basename 04/29/11 1851  NA 139  K 4.5  CL 110  CO2 19  GLUCOSE 108*  BUN 27*  CREATININE 3.51*  CALCIUM 9.3  MG 2.1  PHOS --   Liver Function Tests:  [facility] 04/29/11 1851  AST 23  ALT 13  ALKPHOS 69  BILITOT 0.3  PROT 7.3  ALBUMIN 3.6    Basename 04/29/11 1851  LIPASE 70*  AMYLASE --   No results found for this basename: AMMONIA:2 in the last 72 hours CBC:  Basename 04/29/11 1851  WBC 7.2  NEUTROABS --  HGB 10.8*  HCT 33.0*  MCV 91.4  PLT 136*   Cardiac Enzymes:  Basename 04/29/11 1851  CKTOTAL 166  CKMB 2.9  CKMBINDEX --  TROPONINI <0.30   BNP: No results found for this basename: PROBNP:3 in the last 72  hours D-Dimer: No results found for this basename: DDIMER:2 in the last 72 hours CBG: No results found for this basename: GLUCAP:6 in the last 72 hours Hemoglobin A1C: No results found for this basename: HGBA1C in the last 72 hours Fasting Lipid Panel: No results found for this basename: CHOL,HDL,LDLCALC,TRIG,CHOLHDL,LDLDIRECT in the last 72 hours Thyroid Function Tests:  [facility]  04/29/11 1851  TSH 3.151  T4TOTAL --  FREET4 --  T3FREE --  THYROIDAB --   Anemia Panel: No results found for this basename: VITAMINB12,FOLATE,FERRITIN,TIBC,IRON,RETICCTPCT in the last 72 hours Coagulation: No results found for this basename: LABPROT:2,INR:2 in the last 72 hours Urine Drug Screen: Drugs of Abuse  No results found for this basename: labopia, cocainscrnur, labbenz, amphetmu, thcu, labbarb    Alcohol Level: No results found for this basename: ETH:2 in the last 72 hours Urinalysis: Results for NAYDENE, LAGRANGE (MRN [id number]) as of 04/30/2011 08:22  Ref. Range 04/29/2011 18:48  Color, Urine Latest Range: YELLOW  YELLOW  APPearance Latest Range: CLEAR  CLEAR  Specific Gravity, Urine Latest Range: 1.005-1.030  1.025  pH Latest Range: 5.0-8.0  6.0  Glucose, UA Latest Range: NEGATIVE mg/dL NEGATIVE  Bilirubin Urine Latest Range: NEGATIVE  NEGATIVE  Ketones, ur Latest Range: NEGATIVE mg/dL NEGATIVE  Protein Latest Range: NEGATIVE mg/dL 30 (A)  Urobilinogen, UA Latest Range: 0.0-1.0 mg/dL 0.2  Nitrite Latest Range: NEGATIVE  NEGATIVE  Leukocytes, UA Latest Range: NEGATIVE  TRACE (A)  WBC, UA Latest Range: <3 WBC/hpf 21-50  RBC / HPF Latest Range: <3 RBC/hpf 3-6  Squamous Epithelial / LPF Latest Range: RARE  FEW (A)  Bacteria, UA Latest Range: RARE  MANY (A)  Hgb urine dipstick Latest Range: NEGATIVE  SMALL (A)    Imaging results:  No results found.  Assessment & Plan: Principal Problem:  *Weakness generalized, multifactorial, do to dehydration, urinary tract infection, deconditioning  and weight loss Active Problems:  ARF (acute renal failure): Probably prerenal in the setting of poor appetite and ACE inhibitor  UTI (urinary tract infection)  Probable Pancreatitis: Patient appears to have chronically elevated amylase and lipase but she does have pain and is tender in the epigastric area  Benign hypertension  Chronic Dizziness - light-headed, worse in the setting of above  CKD (chronic kidney disease), stage IV  Anemia, appears new.   Seizure disorder  CAD (coronary artery disease)  Hyperlipidemia  Anxiety  Chronic abdominal pain  S/p nephrectomy  Macular degeneration  Cataract  Bladder cancer  Patient will get IV fluids. Stop ACE inhibitor. Give IV ciprofloxacin. Check an abdominal ultrasound to rule out obstruction both of the remaining kidney, and of the pancreatic duct (though LFTs are normal). Stop the statin for now. Workup anemia. PT consult.  Chelsi Warr L 04/30/2011, 7:59 AM

## 2011-04-30 NOTE — Progress Notes (Signed)
UR Chart Review Completed  

## 2011-04-30 NOTE — Evaluation (Signed)
Physical Therapy Evaluation Patient Details Name: [name] [name] MRN: [id number] DOB: 10-01-1928 Today's Date: 04/30/2011  Problem List:  Patient Active Problem List  Diagnoses  . MIXED HYPERLIPIDEMIA  . 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 ESSENTIAL HYPERTENSION, BENIGN  . POSTSURGICAL AORTOCORONARY BYPASS STATUS  . Weakness generalized  . Seizure disorder  . Benign hypertension  . CAD (coronary artery disease)  . Hyperlipidemia  . Anxiety  . Dizziness - light-headed  . Chronic abdominal pain  . ARF (acute renal failure)  . UTI (urinary tract infection)  . Pancreatitis  . CKD (chronic kidney disease), stage IV  . Anemia  . S/p nephrectomy  . Macular degeneration  . Cataract  . Bladder cancer    Past Medical History:  Past Medical History  Diagnosis Date  . Mixed hyperlipidemia   . 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 Essential hypertension, benign   . Postsurgical aortocoronary bypass status   . Bladder cancer   . Seizure   . Dizziness   . Obesity   . Back pain   . CAD (coronary artery disease)    Past Surgical History:  Past Surgical History  Procedure Date  . Cataract extraction   . Coronary artery bypass graft   . Iridectomy     PT Assessment/Plan/Recommendation PT Assessment Clinical Impression Statement: pt seen for eval...has a fair amount of generalized weakness developed since having the flu in Dec..Marland KitchenMarland KitchenI was unable to ambulate her because of nausea when standing.. currently, she is able to transfer with standby assist, but transfers are very labored...she will probably need a walker for gait (she has one at home)...hopefully, once her nausea has subsided, she will be begin ambulating and develop the strength to return home at d/c with HHPT PT Recommendation/Assessment: Patient will need skilled PT in the acute care venue PT Problem List: Decreased strength;Decreased activity tolerance;Decreased mobility;Decreased balance;Decreased knowledge of use of DME;Pain Barriers  to Discharge: Decreased caregiver support Barriers to Discharge Comments: family is at work during the day PT Therapy Diagnosis : Difficulty walking;Generalized weakness (chronic back pain) PT Plan PT Frequency: Min 3X/week PT Treatment/Interventions: Gait training;Functional mobility training;Therapeutic activities;Therapeutic exercise;Balance training;Patient/family education PT Recommendation Recommendations for Other Services: OT consult Follow Up Recommendations: Home health PT Equipment Recommended: Defer to next venue PT Goals  Acute Rehab PT Goals PT Goal Formulation: With patient Pt will Ambulate: 16 - 50 feet;with supervision;with least restrictive assistive device  PT Evaluation Precautions/Restrictions  Precautions Precautions: Fall Required Braces or Orthoses: No Restrictions Weight Bearing Restrictions: No Prior Functioning  Home Living Lives With: Family Receives Help From: Family Type of Home: House Home Layout: One level Home Access: Stairs to enter Entrance Stairs-Rails: Right Entrance Stairs-Number of Steps: 2 Home Adaptive Equipment: Walker - rolling Prior Function Level of Independence: Independent with basic ADLs;Independent with homemaking with ambulation;Independent with gait;Independent with transfers Vocation: Retired Public librarian Arousal/Alertness: Awake/alert Overall Cognitive Status: Appears within functional limits for tasks assessed Sensation/Coordination Sensation Light Touch: Appears Intact Stereognosis: Not tested Hot/Cold: Not tested Proprioception: Appears Intact Extremity Assessment RUE Assessment RUE Assessment: Exceptions to [facility] RUE Strength RUE Overall Strength Comments: strength 3+/5 LUE Assessment LUE Assessment: Exceptions to [facility] LUE Strength LUE Overall Strength Comments: strength 3+/5 RLE Assessment RLE Assessment: Exceptions to [facility] RLE Strength RLE Overall Strength Comments: strength 3/5 with gastrocnemius  =2/5 LLE Assessment LLE Assessment: Exceptions to [facility] LLE Strength LLE Overall Strength Comments: strength 3/5 with gastrocnemius 2/5 Mobility (including Balance) Bed Mobility Bed Mobility: Yes Rolling Right: 5:  Supervision Rolling Right Details (indicate cue type and reason): cues to flex knees in order to assist roll---has chronic back pain--transfer very labored Right Sidelying to Sit: 5: Supervision Sitting - Scoot to Edge of Bed: 6: Modified independent (Device/Increase time) Sit to Sidelying Right: 5: Supervision Sit to Sidelying Right Details (indicate cue type and reason): cues for correct technique to protect back--transfer very labored Transfers Transfers: Yes Sit to Stand: 6: Modified independent (Device/Increase time) Stand to Sit: 6: Modified independent (Device/Increase time) Stand Pivot Transfers: 6: Modified independent (Device/Increase time) Ambulation/Gait Ambulation/Gait: No (pt very nauseated with standing) Stairs: No Wheelchair Mobility Wheelchair Mobility: No  Posture/Postural Control Posture/Postural Control: No significant limitations Balance Balance Assessed: No Exercise    End of Session PT - End of Session Equipment Utilized During Treatment: Gait belt Activity Tolerance: Patient tolerated treatment well;Patient limited by fatigue;Treatment limited secondary to medical complications (Comment);Other (comment) (nausea in upright position)  Sable Feil 04/30/2011, 11:55 AM

## 2011-04-30 NOTE — Progress Notes (Signed)
Subjective: Feels a little bit stronger. No new complaints. Pain pill is helping tremendously.  Objective: Vital signs in last 24 hours: Filed Vitals:   04/29/11 1703 04/29/11 2236 04/30/11 0547  BP: 144/81 119/65 164/74  Pulse: 68 58 62  Temp: 98 F (36.7 C) 97.5 F (36.4 C) 97.4 F (36.3 C)  TempSrc: Oral Oral Oral  Resp: 18 18 18   Height: 5\' 2"  (1.575 m)    Weight: 77.1 kg (169 lb 15.6 oz)    SpO2: 94% 96% 95%   Weight change:   Intake/Output Summary (Last 24 hours) at 04/30/11 0907 Last data filed at 04/30/11 0742  Gross per 24 hour  Intake   1200 ml  Output    675 ml  Net    525 ml   Physical Exam: Unchanged from 04/29/2011  Lab Results: Basic Metabolic Panel:  Lab [id number] 1851  NA 139  K 4.5  CL 110  CO2 19  GLUCOSE 108*  BUN 27*  CREATININE 3.51*  CALCIUM 9.3  MG 2.1  PHOS --   Liver Function Tests:  Lab 04/29/11 1851  AST 23  ALT 13  ALKPHOS 69  BILITOT 0.3  PROT 7.3  ALBUMIN 3.6    Lab 04/29/11 1851  LIPASE 70*  AMYLASE --   No results found for this basename: AMMONIA:2 in the last 168 hours CBC:  Lab 04/29/11 1851  WBC 7.2  NEUTROABS --  HGB 10.8*  HCT 33.0*  MCV 91.4  PLT 136*   Cardiac Enzymes:  Lab 04/29/11 1851  CKTOTAL 166  CKMB 2.9  CKMBINDEX --  TROPONINI <0.30   BNP: No results found for this basename: PROBNP:3 in the last 168 hours D-Dimer: No results found for this basename: DDIMER:2 in the last 168 hours CBG: No results found for this basename: GLUCAP:6 in the last 168 hours Hemoglobin A1C: No results found for this basename: HGBA1C in the last 168 hours Fasting Lipid Panel: No results found for this basename: CHOL,HDL,LDLCALC,TRIG,CHOLHDL,LDLDIRECT in the last 168 hours Thyroid Function Tests:  Lab 04/29/11 1851  TSH 3.151  T4TOTAL --  FREET4 --  T3FREE --  THYROIDAB --   Coagulation: No results found for this basename: LABPROT:4,INR:4 in the last 168 hours Anemia Panel: No results found for  this basename: VITAMINB12,FOLATE,FERRITIN,TIBC,IRON,RETICCTPCT in the last 168 hours Urine Drug Screen:  Micro Results: No results found for this or any previous visit (from the past 240 hour(s)). Studies/Results: No results found.  Scheduled Meds:   . aspirin EC  81 mg Oral QHS  . ciprofloxacin  200 mg Intravenous Q12H  . docusate sodium  100 mg Oral BID  . [name]-methylfolate-B6-B12  1 tablet Oral QHS  . levETIRAcetam  250 mg Oral QHS  . sodium chloride      . sodium chloride      . temazepam  15 mg Oral QHS   Continuous Infusions:   . sodium chloride 100 mL/hr at 04/30/11 0354   PRN Meds:.acetaminophen, acetaminophen, ALPRAZolam, alum & mag hydroxide-simeth, HYDROcodone-acetaminophen, HYDROmorphone, magnesium hydroxide, nitroGLYCERIN, ondansetron (ZOFRAN) IV, ondansetron Assessment/Plan: Principal Problem:  *Weakness generalized Active Problems:  ARF (acute renal failure)  UTI (urinary tract infection)  Pancreatitis  Benign hypertension  Dizziness - light-headed  CKD (chronic kidney disease), stage IV  Anemia  Seizure disorder  CAD (coronary artery disease)  Hyperlipidemia  Anxiety  Chronic abdominal pain  S/p nephrectomy  Macular degeneration  Cataract  Bladder cancer  Continue current. Await ultrasound. Await repeat labs. Await urine culture.  Will also get physical therapy consult.   LOS: 1 day   [name] [name] 04/30/2011, 9:07 AM

## 2011-05-01 ENCOUNTER — Inpatient Hospital Stay (HOSPITAL_COMMUNITY): Payer: Medicare Other

## 2011-05-01 LAB — CBC
Hemoglobin: 9.1 g/dL — ABNORMAL LOW (ref 12.0–15.0)
Platelets: 123 10*3/uL — ABNORMAL LOW (ref 150–400)
RBC: 3.04 MIL/uL — ABNORMAL LOW (ref 3.87–5.11)
WBC: 6 10*3/uL (ref 4.0–10.5)

## 2011-05-01 LAB — BASIC METABOLIC PANEL
Calcium: 8.3 mg/dL — ABNORMAL LOW (ref 8.4–10.5)
GFR calc Af Amer: 14 mL/min — ABNORMAL LOW (ref >90)
GFR calc non Af Amer: 12 mL/min — ABNORMAL LOW (ref >90)
Glucose, Bld: 94 mg/dL (ref 70–99)
Potassium: 4.7 mEq/L (ref 3.5–5.1)
Sodium: 140 mEq/L (ref 135–145)

## 2011-05-01 LAB — FERRITIN: Ferritin: 182 ng/mL (ref 10–291)

## 2011-05-01 LAB — IRON AND TIBC
Iron: 45 ug/dL (ref 42–135)
TIBC: 212 ug/dL — ABNORMAL LOW (ref 250–470)
UIBC: 167 ug/dL (ref 125–400)

## 2011-05-01 NOTE — Progress Notes (Signed)
Subjective: Much stronger. Had nausea yesterday morning but now gone. Tolerating a diet. Still with pain. Not worse after eating.  Objective: Vital signs in last 24 hours: Filed Vitals:   04/30/11 0547 04/30/11 1430 04/30/11 2135 05/01/11 0529  BP: 164/74 109/57 115/64 106/68  Pulse: 62 68 66 57  Temp: 97.4 F (36.3 C) 98.2 F (36.8 C) 98.4 F (36.9 C) 97.5 F (36.4 C)  TempSrc: Oral  Oral Oral  Resp: 18 18 18 18   Height:      Weight:      SpO2: 95% 99% 97% 99%   Weight change:   Intake/Output Summary (Last 24 hours) at 05/01/11 1106 Last data filed at 05/01/11 0600  Gross per 24 hour  Intake   2420 ml  Output    250 ml  Net   2170 ml   Physical Exam:  Gen.: Brighter. More talkative. Lungs: Clear to auscultation bilaterally without wheeze rhonchi or rales Cardiovascular regular rate rhythm without murmurs gallops rubs Abdomen soft normal bowel sounds less tender Extremities no clubbing cyanosis or edema Psychiatric: Smiling. Affect less depressed  Lab Results: Basic Metabolic Panel:  Lab [id number] 04/30/11 1204 04/30/11 0819 04/29/11 1851  NA 140 140 -- --  K 4.7 4.5 -- --  CL 115* 112 -- --  CO2 18* 19 -- --  GLUCOSE 94 94 -- --  BUN 25* 25* -- --  CREATININE 3.34* 3.44* -- --  CALCIUM 8.3* 9.1 -- --  MG -- -- 2.0 2.1  PHOS -- -- 3.6 --   Liver Function Tests:  Lab 04/29/11 1851  AST 23  ALT 13  ALKPHOS 69  BILITOT 0.3  PROT 7.3  ALBUMIN 3.6    Lab 04/30/11 0819 04/29/11 1851  LIPASE 59 70*  AMYLASE 203* --   No results found for this basename: AMMONIA:2 in the last 168 hours CBC:  Lab 05/01/11 0454 04/29/11 1851  WBC 6.0 7.2  NEUTROABS -- --  HGB 9.1* 10.8*  HCT 28.1* 33.0*  MCV 92.4 91.4  PLT 123* 136*   Cardiac Enzymes:  Lab 04/29/11 1851  CKTOTAL 166  CKMB 2.9  CKMBINDEX --  TROPONINI <0.30   BNP: No results found for this basename: PROBNP:3 in the last 168 hours D-Dimer: No results found for this basename: DDIMER:2 in  the last 168 hours CBG: No results found for this basename: GLUCAP:6 in the last 168 hours Hemoglobin A1C: No results found for this basename: HGBA1C in the last 168 hours Fasting Lipid Panel: No results found for this basename: CHOL,HDL,LDLCALC,TRIG,CHOLHDL,LDLDIRECT in the last 168 hours Thyroid Function Tests:  Lab 04/29/11 1851  TSH 3.151  T4TOTAL --  FREET4 --  T3FREE --  THYROIDAB --   Coagulation: No results found for this basename: LABPROT:4,INR:4 in the last 168 hours Anemia Panel:  Lab 04/30/11 0819  VITAMINB12 >2000*  FOLATE >20.0  FERRITIN 182  TIBC 212*  IRON 45  RETICCTPCT --   Urine Drug Screen:  Micro Results: No results found for this or any previous visit (from the past 240 hour(s)). Studies/Results: US Abdomen Complete  05/01/2011  *RADIOLOGY REPORT*  Clinical Data:  Acute renal failure, history of bladder cancer, pancreatitis  ULTRASOUND ABDOMEN:  Technique:  Sonography of upper abdominal structures was performed.  Comparison:  None Correlation:  CT abdomen 07/24/2010  Gallbladder:  Normally distended without stones or wall thickening. No pericholecystic fluid or sonographic Murphy sign.  Common bile duct:  Prominent, 8 mm diameter, though this could potentially  be normal for age  Liver:  Slightly increasing coarsened desiccation is seen suggesting fatty infiltration, though this can be seen with cirrhosis and certain infiltrative disorders.  No definite focal mass or nodularity.  IVC:  Normal appearance  Pancreas:  Tail incompletely visualized due to bowel gas, visualized portions otherwise unremarkable.  Pancreatic duct normal caliber 2 mm diameter.  Spleen:  Normal appearance, 9.7 cm length  Right kidney:  Surgically absent  Left kidney:  11.3 cm length.  Thinned echogenic cortex.  Small cyst at lower pole, 1.1 x 1.4 x 1.2 cm, containing internal echogenicity.  Aorta:  Plaque without aneurysmal dilatation  Other:  No free fluid  IMPRESSION: Post right  nephrectomy. Medical renal disease changes left kidney with a small complicated cyst 1.1 x 1.4 x 1.2 cm in size, unchanged in size since prior CT. Question mild fatty infiltration of liver.  Original Report Authenticated By: Burnetta Sabin, M.D.    Scheduled Meds:    . aspirin EC  81 mg Oral QHS  . ciprofloxacin  200 mg Intravenous Q12H  . docusate sodium  100 mg Oral BID  . l-methylfolate-B6-B12  1 tablet Oral QHS  . levETIRAcetam  250 mg Oral QHS  . temazepam  15 mg Oral QHS  . DISCONTD: imipenem-cilastatin  250 mg Intravenous Q12H   Continuous Infusions:    . sodium chloride 100 mL/hr at 05/01/11 0130   PRN Meds:.acetaminophen, ALPRAZolam, alum & mag hydroxide-simeth, HYDROcodone-acetaminophen, HYDROmorphone, magnesium hydroxide, nitroGLYCERIN, ondansetron (ZOFRAN) IV, ondansetron Assessment/Plan: Principal Problem:  *Weakness generalized, improving, multifactorial, secondary to urinary tract infection, severe deconditioning, weight loss and malnutrition, prerenal azotemia/dehydration Active Problems:  ARF (acute renal failure) slowly improving  UTI (urinary tract infection) on ciprofloxacin, urine culture pending Doubt Pancreatitis. Lipase was only minimally elevated and now is normal. Most likely had decreased clearance from renal failure and post nephrectomy state. Will check again tomorrow.  Benign hypertension with normal blood pressure off antihypertensives  Dizziness - light-headed, multifactorial. Patient feels that her macular degeneration is contributing, which I suspect is correct.  CKD (chronic kidney disease), stage IV  Anemia. Indices consistent with chronic disease. And renal anemia.  Seizure disorder  CAD (coronary artery disease)  Hyperlipidemia  Anxiety  Chronic abdominal pain  S/p nephrectomy  Macular degeneration  Cataract  Bladder cancer: No evidence of recurrence  Continue IV fluids, IV Cipro, followup urine cultures, continue physical therapy. Home in  2-3 days if patient continues to improve.   LOS: 2 days   Tiesha Marich L 05/01/2011, 11:06 AM

## 2011-05-01 NOTE — Progress Notes (Signed)
INITIAL ADULT NUTRITION ASSESSMENT Date: 05/01/2011   Time: 5:22 PM  Reason for Assessment: Nutrition Risk Report (unintentional wt loss)  ASSESSMENT: Female 78 y.o.  Dx: Weakness generalized   Past Medical History  Diagnosis Date  . Mixed hyperlipidemia   . 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 Essential hypertension, benign   . Postsurgical aortocoronary bypass status   . Bladder cancer   . Seizure   . Dizziness   . Obesity   . Back pain   . CAD (coronary artery disease)    Scheduled Meds:   . aspirin EC  81 mg Oral QHS  . ciprofloxacin  200 mg Intravenous Q12H  . docusate sodium  100 mg Oral BID  . l-methylfolate-B6-B12  1 tablet Oral QHS  . levETIRAcetam  250 mg Oral QHS  . temazepam  15 mg Oral QHS  . DISCONTD: imipenem-cilastatin  250 mg Intravenous Q12H   Continuous Infusions:   . sodium chloride 100 mL/hr at 05/01/11 0130   PRN Meds:.acetaminophen, ALPRAZolam, alum & mag hydroxide-simeth, HYDROcodone-acetaminophen, HYDROmorphone, magnesium hydroxide, nitroGLYCERIN, ondansetron (ZOFRAN) IV, ondansetron  Ht: 5\' 2"  (157.5 cm)  Wt: 169 lb 15.6 oz (77.1 kg)  Ideal Wt: 50.1 kg  % Ideal Wt: 155%  Usual Wt: 180# (03/28/11) % Usual Wt: 94%   Body mass index is 31.09 kg/(m^2). Obesity I   Food/Nutrition Related Hx: 6%, 10# wt loss   CMP     Component Value Date/Time   NA 140 05/01/2011 0454   K 4.7 05/01/2011 0454   CL 115* 05/01/2011 0454   CO2 18* 05/01/2011 0454   GLUCOSE 94 05/01/2011 0454   BUN 25* 05/01/2011 0454   CREATININE 3.34* 05/01/2011 0454   CALCIUM 8.3* 05/01/2011 0454   PROT 7.3 04/29/2011 1851   ALBUMIN 3.6 04/29/2011 1851   AST 23 04/29/2011 1851   ALT 13 04/29/2011 1851   ALKPHOS 69 04/29/2011 1851   BILITOT 0.3 04/29/2011 1851   GFRNONAA 12* 05/01/2011 0454   GFRAA 14* 05/01/2011 0454     Intake/Output Summary (Last 24 hours) at 05/01/11 1728 Last data filed at 05/01/11 0600  Gross per 24 hour  Intake   2420 ml  Output      0 ml  Net    2420 ml    Diet Order: Heart Healthy  Supplements/Tube Feeding:none at this time  IVF:    sodium chloride Last Rate: 100 mL/hr at 05/01/11 0130    Estimated Nutritional Needs:   [id number] kcal/day Protein:46-58 grams/day Fluid:1.5 L/day  NUTRITION DIAGNOSIS: Unintentional wt loss  RELATED TO: inadequate oral intake vs increased nutrient needs  AS EVIDENCE BY: 10# wt loss since December 21st  MONITORING/EVALUATION(Goals): Pt will meet at least minimum est nutritional needs   EDUCATION NEEDS: -No education needs identified at this time  INTERVENTION: -Food preferences obtained -Offer snack between meals  Dietitian [phone]  DOCUMENTATION CODES Per approved criteria  -Non-severe (moderate) malnutrition in the context of acute illness or injury    [name] [name] 05/01/2011, 5:22 PM

## 2011-05-01 NOTE — Progress Notes (Signed)
Physical Therapy Treatment Patient Details Name: RONICE ONISHI MRN: [id number] DOB: 22-May-1928 Today's Date: 05/01/2011  TIME:1017-1045 63mins TE-21mins TA-17mins GT  PT Assessment/Plan  PT - Assessment/Plan Comments on Treatment Session: Pt tolerated therapy very well today. Completed 150' of gait trainging with RW supervison/Min A, due to LOB x1 while turning corner (sticky socks pt stated); Timed sit<>stand; 18 secs without UE support. Pt unsteady during standing exercises without UE support; RW highly recommenced to avoid falls Patient was I with use of bathroom ADLS's as well. PT Goals  Acute Rehab PT Goals PT Goal: Ambulate - Progress: Met  PT Treatment Precautions/Restrictions  Precautions Precautions: Fall Required Braces or Orthoses: No Restrictions Weight Bearing Restrictions: No Mobility (including Balance) Bed Mobility Rolling Right: 6: Modified independent (Device/Increase time) Right Sidelying to Sit: 6: Modified independent (Device/Increase time);HOB flat Right Sidelying to Sit Details (indicate cue type and reason): use of railing Sitting - Scoot to Edge of Bed: 6: Modified independent (Device/Increase time) Sit to Sidelying Right: 6: Modified independent (Device/Increase time) Transfers Transfers: Yes Sit to Stand: 6: Modified independent (Device/Increase time) (verbal cues for hand placement to surface or LE for standing) Stand to Sit: 6: Modified independent (Device/Increase time) Ambulation/Gait Ambulation/Gait: Yes Ambulation/Gait Assistance: 5: Supervision Ambulation/Gait Assistance Details (indicate cue type and reason): LOB x1 due; pt able to self correct. Patient stated socks sticking to floor Ambulation Distance (Feet): 150 Feet Assistive device: Rolling walker Gait Pattern: Decreased hip/knee flexion - right;Decreased hip/knee flexion - left Stairs: No Wheelchair Mobility Wheelchair Mobility: No    Exercise  General Exercises - Upper  Extremity Shoulder Flexion: Both;10 reps Shoulder Horizontal ADduction: Both;10 reps General Exercises - Lower Extremity Long Arc Quad: Both;20 reps Toe Raises: Both;20 reps Heel Raises: Both (20 reps) Other Exercises Other Exercises: standing hip flex/ext/abd x5 without UE; very unsteady, needed to hold oject or PTA for balance several times End of Session PT - End of Session Activity Tolerance: Patient tolerated treatment well Patient left: in bed;with call bell in reach;with bed alarm set General Behavior During Session: [facility] for tasks performed Cognition: [facility] for tasks performed  Ara Mano, [place] 05/01/2011, 10:54 AM

## 2011-05-02 LAB — URINE CULTURE: Culture  Setup Time: 201301241315

## 2011-05-02 LAB — BASIC METABOLIC PANEL
CO2: 19 mEq/L (ref 19–32)
Calcium: 8.2 mg/dL — ABNORMAL LOW (ref 8.4–10.5)
Chloride: 114 mEq/L — ABNORMAL HIGH (ref 96–112)
Glucose, Bld: 96 mg/dL (ref 70–99)
Potassium: 4.6 mEq/L (ref 3.5–5.1)
Sodium: 140 mEq/L (ref 135–145)

## 2011-05-02 LAB — AMYLASE: Amylase: 134 U/L — ABNORMAL HIGH (ref 0–105)

## 2011-05-02 LAB — LIPASE, BLOOD: Lipase: 68 U/L — ABNORMAL HIGH (ref 11–59)

## 2011-05-02 MED ORDER — SODIUM CHLORIDE 0.9 % IJ SOLN
INTRAMUSCULAR | Status: AC
  Filled 2011-05-02: qty 3

## 2011-05-02 MED ORDER — CIPROFLOXACIN HCL 250 MG PO TABS
250.0000 mg | ORAL_TABLET | Freq: Two times a day (BID) | ORAL | Status: DC
  Administered 2011-05-02 – 2011-05-03 (×3): 250 mg via ORAL
  Filled 2011-05-02 (×3): qty 1

## 2011-05-02 NOTE — Progress Notes (Signed)
Physical Therapy Treatment Patient Details Name: [name] [name] MRN: [id number] DOB: 1929/02/05 Today's Date: 05/02/2011 Time: 1:05-1:30 Charge: TA 25 min  PT Assessment/Plan  PT - Assessment/Plan Comments on Treatment Session: Pt tolerated treatment well with increased distance ambulating with RW supervision.  No LOB episodes through session.  Min cueing for hand placement with sit <>stand, able to stand without UE support. PT Goals  Acute Rehab PT Goals PT Goal Formulation: With patient Pt will Ambulate: 16 - 50 feet;with supervision;with least restrictive assistive device PT Goal: Ambulate - Progress: Met  PT Treatment Precautions/Restrictions  Precautions Precautions: Fall Required Braces or Orthoses: No Restrictions Weight Bearing Restrictions: No Mobility (including Balance) Bed Mobility Bed Mobility: Yes Rolling Right: 5: Supervision Rolling Right Details (indicate cue type and reason): cueing for hand placement Right Sidelying to Sit: 5: Supervision Right Sidelying to Sit Details (indicate cue type and reason): cueing for hand placement Transfers Transfers: Yes Sit to Stand: 5: Supervision Sit to Stand Details (indicate cue type and reason): cueing for hand placement Stand to Sit: 5: Supervision Stand to Sit Details: cueing for hand placement Ambulation/Gait Ambulation/Gait: Yes Ambulation/Gait Assistance: 5: Supervision Ambulation/Gait Assistance Details (indicate cue type and reason): no LOB episodes, pt ambulated with good posture and equal stride length Ambulation Distance (Feet): 300 Feet Assistive device: Rolling walker Gait Pattern: Within Functional Limits Gait velocity: slow Wheelchair Mobility Wheelchair Mobility: No    Exercise  General Exercises - Lower Extremity Toe Raises: Strengthening;Both;15 reps;Standing Heel Raises: Strengthening;Both;15 reps;Standing End of Session PT - End of Session Equipment Utilized During Treatment: Gait belt Activity  Tolerance: Patient tolerated treatment well Patient left: in chair;with call bell in reach;with bed alarm set Nurse Communication:  (nurse notified for pain meds) General Behavior During Session: [facility] for tasks performed Cognition: [facility] for tasks performed  Aldona Lento 05/02/2011, 1:46 PM

## 2011-05-02 NOTE — Progress Notes (Signed)
Subjective: Complaining of being up all night, having to urinate. Feeling stronger. No further nausea. Pain remains unchanged from her chronic abdominal pain  Objective: Vital signs in last 24 hours: Filed Vitals:   05/01/11 0529 05/01/11 1400 05/01/11 2205 05/02/11 0556  BP: 106/68 104/59 108/63 148/66  Pulse: 57 63 63 63  Temp: 97.5 F (36.4 C) 98.1 F (36.7 C) 98 F (36.7 C) 97.6 F (36.4 C)  TempSrc: Oral  Oral Oral  Resp: 18 20 20 20   Height:      Weight:      SpO2: 99% 93% 94% 95%   Weight change:   Intake/Output Summary (Last 24 hours) at 05/02/11 0952 Last data filed at 05/02/11 0800  Gross per 24 hour  Intake   2001 ml  Output    400 ml  Net   1601 ml   Physical Exam:  Gen.: Tired appearing Lungs: Clear to auscultation bilaterally without wheeze rhonchi or rales Cardiovascular regular rate rhythm without murmurs gallops rubs Abdomen soft normal bowel sounds less tender Extremities no clubbing cyanosis or edema  Lab Results: Basic Metabolic Panel:  Lab [id number] 0501 05/01/11 0454 04/30/11 0819 04/29/11 1851  NA 140 140 -- --  K 4.6 4.7 -- --  CL 114* 115* -- --  CO2 19 18* -- --  GLUCOSE 96 94 -- --  BUN 25* 25* -- --  CREATININE 2.99* 3.34* -- --  CALCIUM 8.2* 8.3* -- --  MG -- -- 2.0 2.1  PHOS -- -- 3.6 --   Liver Function Tests:  Lab 04/29/11 1851  AST 23  ALT 13  ALKPHOS 69  BILITOT 0.3  PROT 7.3  ALBUMIN 3.6    Lab 05/02/11 0501 04/30/11 0819  LIPASE 68* 59  AMYLASE 134* 203*   No results found for this basename: AMMONIA:2 in the last 168 hours CBC:  Lab 05/01/11 0454 04/29/11 1851  WBC 6.0 7.2  NEUTROABS -- --  HGB 9.1* 10.8*  HCT 28.1* 33.0*  MCV 92.4 91.4  PLT 123* 136*   Cardiac Enzymes:  Lab 04/29/11 1851  CKTOTAL 166  CKMB 2.9  CKMBINDEX --  TROPONINI <0.30   BNP: No results found for this basename: PROBNP:3 in the last 168 hours D-Dimer: No results found for this basename: DDIMER:2 in the last 168  hours CBG: No results found for this basename: GLUCAP:6 in the last 168 hours Hemoglobin A1C: No results found for this basename: HGBA1C in the last 168 hours Fasting Lipid Panel: No results found for this basename: CHOL,HDL,LDLCALC,TRIG,CHOLHDL,LDLDIRECT in the last 168 hours Thyroid Function Tests:  Lab 04/29/11 1851  TSH 3.151  T4TOTAL --  FREET4 --  T3FREE --  THYROIDAB --   Coagulation: No results found for this basename: LABPROT:4,INR:4 in the last 168 hours Anemia Panel:  Lab 04/30/11 0819  VITAMINB12 >2000*  FOLATE >20.0  FERRITIN 182  TIBC 212*  IRON 45  RETICCTPCT --   Urine Drug Screen:  Micro Results: No results found for this or any previous visit (from the past 240 hour(s)). Studies/Results: US Abdomen Complete  05/01/2011  *RADIOLOGY REPORT*  Clinical Data:  Acute renal failure, history of bladder cancer, pancreatitis  ULTRASOUND ABDOMEN:  Technique:  Sonography of upper abdominal structures was performed.  Comparison:  None Correlation:  CT abdomen 07/24/2010  Gallbladder:  Normally distended without stones or wall thickening. No pericholecystic fluid or sonographic Murphy sign.  Common bile duct:  Prominent, 8 mm diameter, though this could potentially be normal for  age  Liver:  Slightly increasing coarsened desiccation is seen suggesting fatty infiltration, though this can be seen with cirrhosis and certain infiltrative disorders.  No definite focal mass or nodularity.  IVC:  Normal appearance  Pancreas:  Tail incompletely visualized due to bowel gas, visualized portions otherwise unremarkable.  Pancreatic duct normal caliber 2 mm diameter.  Spleen:  Normal appearance, 9.7 cm length  Right kidney:  Surgically absent  Left kidney:  11.3 cm length.  Thinned echogenic cortex.  Small cyst at lower pole, 1.1 x 1.4 x 1.2 cm, containing internal echogenicity.  Aorta:  Plaque without aneurysmal dilatation  Other:  No free fluid  IMPRESSION: Post right nephrectomy. Medical  renal disease changes left kidney with a small complicated cyst 1.1 x 1.4 x 1.2 cm in size, unchanged in size since prior CT. Question mild fatty infiltration of liver.  Original Report Authenticated By: Burnetta Sabin, M.D.    Scheduled Meds:    . aspirin EC  81 mg Oral QHS  . ciprofloxacin  250 mg Oral BID  . docusate sodium  100 mg Oral BID  . l-methylfolate-B6-B12  1 tablet Oral QHS  . levETIRAcetam  250 mg Oral QHS  . temazepam  15 mg Oral QHS  . DISCONTD: ciprofloxacin  200 mg Intravenous Q12H   Continuous Infusions:    . sodium chloride 100 mL/hr at 05/02/11 0010   PRN Meds:.acetaminophen, ALPRAZolam, alum & mag hydroxide-simeth, HYDROcodone-acetaminophen, HYDROmorphone, magnesium hydroxide, nitroGLYCERIN, ondansetron (ZOFRAN) IV, ondansetron Assessment/Plan: Principal Problem:  *Weakness generalized, improving, multifactorial, secondary to urinary tract infection, severe deconditioning, weight loss and malnutrition, prerenal azotemia/dehydration Active Problems:  ARF (acute renal failure) slowly improving  UTI (urinary tract infection) on ciprofloxacin, urine culture pending Doubt Pancreatitis. Lipase was only minimally elevated and now is normal. Most likely had decreased clearance from renal failure and post nephrectomy state. Will check again tomorrow.  Benign hypertension with normal blood pressure off antihypertensives  Dizziness - light-headed, multifactorial. Patient feels that her macular degeneration is contributing, which I suspect is correct.  CKD (chronic kidney disease), stage IV  Anemia. Indices consistent with chronic disease. And renal anemia.  Seizure disorder  CAD (coronary artery disease)  Hyperlipidemia  Anxiety  Chronic abdominal pain  S/p nephrectomy  Macular degeneration  Cataract  Bladder cancer: No evidence of recurrence  Renal function almost back to baseline. We'll continue IV fluids until bedtime then stop. Await urine culture. Change Cipro  to by mouth. Home tomorrow if she remains stable.   LOS: 3 days   Kortney Schoenfelder L 05/02/2011, 9:52 AM

## 2011-05-02 NOTE — Progress Notes (Signed)
CARE MANAGEMENT NOTE 05/02/2011  Patient:  [name] [name], [name] [name]   Account Number:  [id number]  Date Initiated:  05/01/2011  Documentation initiated by:  Remo Lipps  Subjective/Objective Assessment:   78 yr female with weakness dehydration lives with her son and his wife. plans to return home     Action/Plan:   Anticipated DC Date:  05/02/2011   Anticipated DC Plan:  [place]  CM consult      [facility] Choice  HOME HEALTH   Choice offered to / List presented to:  C-1 Patient        [place] arranged  [facility]      [place].   Status of service:   Medicare Important Message given?   (If response is "NO", the following Medicare IM given date fields will be blank) Date Medicare IM given:   Date Additional Medicare IM given:    Discharge Disposition:  [place]  Per UR Regulation:    Comments:  05/02/2011 Remo Lipps rn bsn pt for probable d/c tomorrow above hh service arranged. discussed with pt and with daughter in law cheryl 05/01/2011 Remo Lipps rn bsn spoke to pt and her sister. possible d/c home tomorrow. above hh arranged.

## 2011-05-03 LAB — BASIC METABOLIC PANEL
CO2: 21 mEq/L (ref 19–32)
Chloride: 114 mEq/L — ABNORMAL HIGH (ref 96–112)
Creatinine, Ser: 2.66 mg/dL — ABNORMAL HIGH (ref 0.50–1.10)
GFR calc Af Amer: 18 mL/min — ABNORMAL LOW (ref >90)
Sodium: 140 mEq/L (ref 135–145)

## 2011-05-03 MED ORDER — AMLODIPINE BESYLATE 2.5 MG PO TABS: 2.5000 mg | ORAL_TABLET | Freq: Every day | ORAL | Status: DC

## 2011-05-03 MED ORDER — HYDROCODONE-ACETAMINOPHEN 7.5-500 MG PO TABS: 1.5000 | ORAL_TABLET | Freq: Four times a day (QID) | ORAL | Status: DC | PRN

## 2011-05-03 MED ORDER — LEVOFLOXACIN 500 MG PO TABS: 500.0000 mg | ORAL_TABLET | Freq: Every day | ORAL | Status: AC

## 2011-05-03 NOTE — Progress Notes (Signed)
Discharge instructions reviewed with patient. Verbalized understanding. Pt dc'd to home with family. Schonewitz, Eulis Canner 05/03/2011

## 2011-05-03 NOTE — Discharge Summary (Signed)
Physician Discharge Summary  Patient ID: [name] [name] MRN: [id number] DOB/AGE: [birth year] 78 y.o.  Admit date: 04/29/2011 Discharge date: 05/03/2011  Discharge Diagnoses:  Principal Problem:  *Weakness generalized Active Problems:  ARF (acute renal failure) Dehydration  UTI (urinary tract infection)  Benign hypertension  Dizziness - light-headed  CKD (chronic kidney disease), stage IV  Anemia do to chronic disease/chronic kidney disease  Seizure disorder  CAD (coronary artery disease)  Hyperlipidemia  Anxiety  Chronic abdominal pain  S/p nephrectomy  Macular degeneration  Cataract  Bladder cancer   Medication List  As of 05/03/2011 10:39 AM   STOP taking these medications         lisinopril 10 MG tablet         TAKE these medications         ALPRAZolam 0.5 MG tablet   Commonly known as: XANAX   Take 0.125-0.5 mg by mouth daily as needed. For nerves      amLODipine 2.5 MG tablet   Commonly known as: NORVASC   Take 1 tablet (2.5 mg total) by mouth daily.      aspirin EC 81 MG tablet   Take 81 mg by mouth at bedtime.      CRESTOR 20 MG tablet   Generic drug: rosuvastatin   Take 1 tablet by mouth at bedtime.      docusate sodium 100 MG capsule   Commonly known as: COLACE   Take 200 mg by mouth at bedtime.      HYDROcodone-acetaminophen 7.5-500 MG per tablet   Commonly known as: LORTAB   Take 1.5 tablets by mouth every 6 (six) hours as needed. For pain      ICAPS PO   Take 1 tablet by mouth at bedtime.      l-methylfolate-B6-B12 3-35-2 MG Tabs   Commonly known as: METANX   Take 1 tablet by mouth at bedtime.      levETIRAcetam 250 MG tablet   Commonly known as: KEPPRA   Take 1 tablet by mouth at bedtime.      levofloxacin 500 MG tablet   Commonly known as: LEVAQUIN   Take 1 tablet (500 mg total) by mouth daily.      nitroGLYCERIN 0.4 MG SL tablet   Commonly known as: NITROSTAT   Place 1 tablet (0.4 mg total) under the tongue every 5 (five) minutes  as needed for chest pain.      temazepam 15 MG capsule   Commonly known as: RESTORIL   Take 15 mg by mouth at bedtime as needed. For sleep            Discharge Orders    Future Orders Please Complete By Expires   Diet - low sodium heart healthy      Discharge instructions      Comments:   Drink plenty of fluids.   Activity as tolerated - No restrictions         Follow-up Information    Follow up with [facility]. [phone])          Disposition: Home or Self Care with home physical therapy  Discharged Condition: Stable  Consults:   PT  Labs:   Results for orders placed during the hospital encounter of 04/29/11 (from the past 48 hour(s))  BASIC METABOLIC PANEL     Status: Abnormal   Collection Time   05/02/11  5:01 AM      Component Value Range Comment   Sodium 140  135 -  145 (mEq/L)    Potassium 4.6  3.5 - 5.1 (mEq/L)    Chloride 114 (*) 96 - 112 (mEq/L)    CO2 19  19 - 32 (mEq/L)    Glucose, Bld 96  70 - 99 (mg/dL)    BUN 25 (*) 6 - 23 (mg/dL)    Creatinine, Ser 2.99 (*) 0.50 - 1.10 (mg/dL)    Calcium 8.2 (*) 8.4 - 10.5 (mg/dL)    GFR calc non Af Amer 14 (*) >90 (mL/min)    GFR calc Af Amer 16 (*) >90 (mL/min)   AMYLASE     Status: Abnormal   Collection Time   05/02/11  5:01 AM      Component Value Range Comment   Amylase 134 (*) 0 - 105 (U/L)   LIPASE, BLOOD     Status: Abnormal   Collection Time   05/02/11  5:01 AM      Component Value Range Comment   Lipase 68 (*) 11 - 59 (U/L)   BASIC METABOLIC PANEL     Status: Abnormal   Collection Time   05/03/11  6:35 AM      Component Value Range Comment   Sodium 140  135 - 145 (mEq/L)    Potassium 5.0  3.5 - 5.1 (mEq/L)    Chloride 114 (*) 96 - 112 (mEq/L)    CO2 21  19 - 32 (mEq/L)    Glucose, Bld 97  70 - 99 (mg/dL)    BUN 20  6 - 23 (mg/dL)    Creatinine, Ser 2.66 (*) 0.50 - 1.10 (mg/dL)    Calcium 8.4  8.4 - 10.5 (mg/dL)    GFR calc non Af Amer 16 (*) >90 (mL/min)    GFR calc Af Amer 18 (*)  >90 (mL/min)     Diagnostics:  US Abdomen Complete  05/01/2011  *RADIOLOGY REPORT*  Clinical Data:  Acute renal failure, history of bladder cancer, pancreatitis  ULTRASOUND ABDOMEN:  Technique:  Sonography of upper abdominal structures was performed.  Comparison:  None Correlation:  CT abdomen 07/24/2010  Gallbladder:  Normally distended without stones or wall thickening. No pericholecystic fluid or sonographic Murphy sign.  Common bile duct:  Prominent, 8 mm diameter, though this could potentially be normal for age  Liver:  Slightly increasing coarsened desiccation is seen suggesting fatty infiltration, though this can be seen with cirrhosis and certain infiltrative disorders.  No definite focal mass or nodularity.  IVC:  Normal appearance  Pancreas:  Tail incompletely visualized due to bowel gas, visualized portions otherwise unremarkable.  Pancreatic duct normal caliber 2 mm diameter.  Spleen:  Normal appearance, 9.7 cm length  Right kidney:  Surgically absent  Left kidney:  11.3 cm length.  Thinned echogenic cortex.  Small cyst at lower pole, 1.1 x 1.4 x 1.2 cm, containing internal echogenicity.  Aorta:  Plaque without aneurysmal dilatation  Other:  No free fluid  IMPRESSION: Post right nephrectomy. Medical renal disease changes left kidney with a small complicated cyst 1.1 x 1.4 x 1.2 cm in size, unchanged in size since prior CT. Question mild fatty infiltration of liver.  Original Report Authenticated By: Burnetta Sabin, M.D.   Hospital Course:  See H&P for complete admission details. The patient is a pleasant 78 year old white female who presented with profound weakness and difficulty walking. She had no focal deficits. She started getting weak last month when she had the flu. Her appetite has been poor. She's been intermittently nauseated and vomiting. She has  chronic abdominal pain. She has chronic kidney disease and is post nephrectomy. Her baseline creatinine it appears runs about 2.5. On admission,  her creatinine was 3.5. Her BUN was 27. She was afebrile without evidence of orthostatic hypotension, but on physical examination had dry mucous membranes and dehydrated. She did have some abdominal tenderness. It is unclear whether this is typical for her, as she reports some chronic abdominal pain. She did have evidence of urinary tract infection and was started on ciprofloxacin and IV fluids. Her liver function tests were normal. She had an elevated lipase and amylase which was mild. I doubt that she has pancreatitis. She has an atrophic pancreas on ultrasound of the abdomen, but no inflammation in the area. She also had no evidence of cholelithiasis or cholecystitis on ultrasound. No hydro-nephrosis. She work with physical therapy. She did slowly improve with regard to her strength. By the time of discharge she was able to ambulate independently but would still benefit from home physical therapy. She had been on ACE inhibitor prior to admission which was stopped because of the acute renal failure. I have placed her instead on a small dose of amlodipine. She already has a followup appointment with her nephrologist, Dr. Florene Glen for next month. He will need to decide whether or not she can go back on ACE inhibitor. Her renal function is back to baseline. She is feeling much better. She usually takes 7.5 mg hydrocodone tablets at home but reports better relief with 10 mg. She plans on taking 1-1/2 tablets of her Vicodin at home. Her other medical problems remained stable during hospitalization. Total time on the day of discharge greater than 30 minutes.  Discharge Exam:  Blood pressure 144/80, pulse 62, temperature 98.1 F (36.7 C), temperature source Oral, resp. rate 18, height 5\' 2"  (1.575 m), weight 77.1 kg (169 lb 15.6 oz), SpO2 97.00%.  General: Right. Talkative. Able to ambulate in her room by herself Exam otherwise unchanged from 05/02/2011.  SignedDelfina Redwood 05/03/2011, 10:39 AM

## 2011-05-19 ENCOUNTER — Other Ambulatory Visit: Payer: Self-pay | Admitting: Internal Medicine

## 2011-11-02 ENCOUNTER — Encounter (HOSPITAL_COMMUNITY): Payer: Self-pay | Admitting: *Deleted

## 2011-11-02 ENCOUNTER — Emergency Department (HOSPITAL_COMMUNITY): Payer: Medicare Other

## 2011-11-02 ENCOUNTER — Observation Stay (HOSPITAL_COMMUNITY)
Admission: EM | Admit: 2011-11-02 | Discharge: 2011-11-03 | Disposition: A | Discharge status: Home / Self Care | Payer: Medicare Other | Attending: Internal Medicine | Admitting: Internal Medicine

## 2011-11-02 DIAGNOSIS — R109 Unspecified abdominal pain: Secondary | ICD-10-CM

## 2011-11-02 DIAGNOSIS — N184 Chronic kidney disease, stage 4 (severe): Secondary | ICD-10-CM | POA: Insufficient documentation

## 2011-11-02 DIAGNOSIS — E782 Mixed hyperlipidemia: Secondary | ICD-10-CM

## 2011-11-02 DIAGNOSIS — R531 Weakness: Secondary | ICD-10-CM

## 2011-11-02 DIAGNOSIS — D649 Anemia, unspecified: Secondary | ICD-10-CM

## 2011-11-02 DIAGNOSIS — G40909 Epilepsy, unspecified, not intractable, without status epilepticus: Secondary | ICD-10-CM | POA: Insufficient documentation

## 2011-11-02 DIAGNOSIS — Z905 Acquired absence of kidney: Secondary | ICD-10-CM

## 2011-11-02 DIAGNOSIS — N179 Acute kidney failure, unspecified: Secondary | ICD-10-CM

## 2011-11-02 DIAGNOSIS — R079 Chest pain, unspecified: Secondary | ICD-10-CM | POA: Diagnosis present

## 2011-11-02 DIAGNOSIS — I129 Hypertensive chronic kidney disease with stage 1 through stage 4 chronic kidney disease, or unspecified chronic kidney disease: Secondary | ICD-10-CM | POA: Insufficient documentation

## 2011-11-02 DIAGNOSIS — F419 Anxiety disorder, unspecified: Secondary | ICD-10-CM | POA: Diagnosis present

## 2011-11-02 DIAGNOSIS — I2510 Atherosclerotic heart disease of native coronary artery without angina pectoris: Secondary | ICD-10-CM | POA: Insufficient documentation

## 2011-11-02 DIAGNOSIS — N390 Urinary tract infection, site not specified: Secondary | ICD-10-CM

## 2011-11-02 DIAGNOSIS — Z951 Presence of aortocoronary bypass graft: Secondary | ICD-10-CM

## 2011-11-02 DIAGNOSIS — R42 Dizziness and giddiness: Secondary | ICD-10-CM

## 2011-11-02 DIAGNOSIS — E785 Hyperlipidemia, unspecified: Secondary | ICD-10-CM | POA: Insufficient documentation

## 2011-11-02 DIAGNOSIS — R0789 Other chest pain: Principal | ICD-10-CM | POA: Insufficient documentation

## 2011-11-02 DIAGNOSIS — G40401 Other generalized epilepsy and epileptic syndromes, not intractable, with status epilepticus: Secondary | ICD-10-CM

## 2011-11-02 DIAGNOSIS — F411 Generalized anxiety disorder: Secondary | ICD-10-CM | POA: Insufficient documentation

## 2011-11-02 DIAGNOSIS — I10 Essential (primary) hypertension: Secondary | ICD-10-CM

## 2011-11-02 DIAGNOSIS — H3530 Unspecified macular degeneration: Secondary | ICD-10-CM

## 2011-11-02 DIAGNOSIS — C679 Malignant neoplasm of bladder, unspecified: Secondary | ICD-10-CM

## 2011-11-02 DIAGNOSIS — R0602 Shortness of breath: Secondary | ICD-10-CM | POA: Insufficient documentation

## 2011-11-02 LAB — POCT I-STAT, CHEM 8
Calcium, Ion: 1.18 mmol/L (ref 1.13–1.30)
Glucose, Bld: 123 mg/dL — ABNORMAL HIGH (ref 70–99)
HCT: 34 % — ABNORMAL LOW (ref 36.0–46.0)
Hemoglobin: 11.6 g/dL — ABNORMAL LOW (ref 12.0–15.0)

## 2011-11-02 LAB — CBC WITH DIFFERENTIAL/PLATELET
Basophils Absolute: 0.1 10*3/uL (ref 0.0–0.1)
Basophils Relative: 1 % (ref 0–1)
Eosinophils Absolute: 0.6 10*3/uL (ref 0.0–0.7)
Eosinophils Relative: 7 % — ABNORMAL HIGH (ref 0–5)
HCT: 41.1 % (ref 36.0–46.0)
MCHC: 33.8 g/dL (ref 30.0–36.0)
MCV: 86.3 fL (ref 78.0–100.0)
Monocytes Absolute: 0.7 10*3/uL (ref 0.1–1.0)
RDW: 16.2 % — ABNORMAL HIGH (ref 11.5–15.5)

## 2011-11-02 MED ORDER — ASPIRIN 81 MG PO CHEW
324.0000 mg | CHEWABLE_TABLET | Freq: Once | ORAL | Status: AC
  Administered 2011-11-02: 324 mg via ORAL
  Filled 2011-11-02: qty 4

## 2011-11-02 MED ORDER — NITROGLYCERIN 0.4 MG SL SUBL
0.4000 mg | SUBLINGUAL_TABLET | SUBLINGUAL | Status: AC | PRN
  Administered 2011-11-02 (×3): 0.4 mg via SUBLINGUAL

## 2011-11-02 NOTE — ED Provider Notes (Signed)
History     CSN: [id number]  Arrival date & time 11/02/11  1905   First MD Initiated Contact with Patient 11/02/11 2145      Chief Complaint  Patient presents with  . Chest Pain    Patient is a 78 y.o. female presenting with chest pain. The history is provided by the patient.  Chest Pain Episode onset: earlier today. Chest pain occurs constantly. The chest pain is unchanged. Associated with: rest. The severity of the pain is moderate. The quality of the pain is described as aching and sharp. The pain does not radiate. Exacerbated by: nothing. Primary symptoms include fatigue, shortness of breath and nausea. Pertinent negatives for primary symptoms include no fever, no abdominal pain and no vomiting.  Associated symptoms include lower extremity edema. She tried nitroglycerin for the symptoms.   pt reports some improvement with NTG  Past Medical History  Diagnosis Date  . Mixed hyperlipidemia   . 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 Essential hypertension, benign   . Postsurgical aortocoronary bypass status   . Bladder cancer   . Seizure   . Dizziness   . Obesity   . Back pain   . CAD (coronary artery disease)     Past Surgical History  Procedure Date  . Cataract extraction   . Coronary artery bypass graft   . Iridectomy     Family History  Problem Relation Age of Onset  . Stroke    . Coronary artery disease    . Cancer    . Diabetes      History  Substance Use Topics  . Smoking status: Never Smoker   . Smokeless tobacco: Not on file  . Alcohol Use: No    OB History    Grav Para Term Preterm Abortions TAB SAB Ect Mult Living                  Review of Systems  Constitutional: Positive for fatigue. Negative for fever.  Respiratory: Positive for shortness of breath.   Cardiovascular: Positive for chest pain.  Gastrointestinal: Positive for nausea. Negative for vomiting and abdominal pain.  All other systems reviewed and are negative.    Allergies    Penicillins and Sulfa antibiotics  Home Medications   Current Outpatient Rx  Name Route Sig Dispense Refill  . ALPRAZOLAM 0.5 MG PO TABS Oral Take 0.125-0.5 mg by mouth daily as needed. For nerves    . AMLODIPINE BESYLATE 5 MG PO TABS Oral Take 5 mg by mouth daily.    . ASPIRIN EC 81 MG PO TBEC Oral Take 81 mg by mouth at bedtime.     . CRESTOR 20 MG PO TABS Oral Take 1 tablet by mouth at bedtime.     Marland Kitchen DOCUSATE SODIUM 100 MG PO CAPS Oral Take 200 mg by mouth at bedtime.    Marland Kitchen HYDROCODONE-ACETAMINOPHEN 7.5-500 MG PO TABS Oral Take 1.5 tablets by mouth every 6 (six) hours as needed. For pain    . L-METHYLFOLATE-B6-B12 3-35-2 MG PO TABS Oral Take 1 tablet by mouth at bedtime.     Marland Kitchen LEVETIRACETAM 250 MG PO TABS Oral Take 1 tablet by mouth at bedtime.     . ICAPS PO Oral Take 1 tablet by mouth at bedtime.    Marland Kitchen NITROGLYCERIN 0.4 MG SL SUBL Sublingual Place 0.4 mg under the tongue every 5 (five) minutes as needed. For chest pain    . TEMAZEPAM 15 MG PO CAPS Oral Take 15 mg  by mouth at bedtime as needed. For sleep      BP 156/64  Pulse 71  Temp 98.7 F (37.1 C) (Oral)  Resp 18  SpO2 98%  Physical Exam CONSTITUTIONAL: Well developed/well nourished HEAD AND FACE: Normocephalic/atraumatic EYES: EOMI/PERRL ENMT: Mucous membranes moist NECK: supple no meningeal signs SPINE:entire spine nontender CV: S1/S2 noted, no murmurs/rubs/gallops noted LUNGS: Lungs are clear to auscultation bilaterally, no apparent distress ABDOMEN: soft, nontender, no rebound or guarding GU:no cva tenderness NEURO: Pt is awake/alert, moves all extremitiesx4 EXTREMITIES: pulses normal, full ROM SKIN: warm, color normal PSYCH: no abnormalities of mood noted  ED Course  Procedures  Labs Reviewed  CK TOTAL AND CKMB - Abnormal; Notable for the following:    Total CK 186 (*)     All other components within normal limits  CBC WITH DIFFERENTIAL - Abnormal; Notable for the following:    RDW 16.2 (*)      Eosinophils Relative 7 (*)     All other components within normal limits  POCT I-STAT, CHEM 8 - Abnormal; Notable for the following:    Potassium 5.3 (*)     BUN 29 (*)     Creatinine, Ser 1.90 (*)     Glucose, Bld 123 (*)     Hemoglobin 11.6 (*)     HCT 34.0 (*)     All other components within normal limits  PRO B NATRIURETIC PEPTIDE  POCT I-STAT TROPONIN I   Dg Chest 2 View  11/02/2011  *RADIOLOGY REPORT*  Clinical Data: Chest pain.  CHEST - 2 VIEW  Comparison: 05/26/2007.  Findings: The heart is mildly enlarged but stable.  The mediastinal and hilar contours are unchanged.  Stable surgical changes from bypass surgery.  There are chronic bronchitic type lung changes but no acute overlying pulmonary process.  No pleural effusion.  A small Bochdalek hernia is noted on the left.  Vertebral augmentation changes are noted in the L1 vertebral body.  The bony thorax is intact.  IMPRESSION: Chronic bronchitic type lung changes but no acute pulmonary findings.  Original Report Authenticated By: P. Kalman Jewels, M.D.   12:24 AM Pt with h/o CAD (h/o CABG in past) reports today's pain similar to prior cardiac pain.  No significant history of pleuritic pain.  I doubt PE/Dissection  She is well appearing but still with pain despite NTG. I spoke to dr Claiborne Billings, on for cardiology request I call hospitalist for admission     MDM  Nursing notes including past medical history and social history reviewed and considered in documentation xrays reviewed and considered All labs/vitals reviewed and considered Previous records reviewed and considered      Date: 11/02/2011  Rate: 74  Rhythm: normal sinus rhythm  QRS Axis: left  Intervals: normal  ST/T Wave abnormalities: nonspecific ST changes  Conduction Disutrbances:none  Narrative Interpretation:   Old EKG Reviewed: unchanged      Sharyon Cable, MD 11/03/11 0025

## 2011-11-02 NOTE — ED Notes (Signed)
Dr. Wickline at bedside.  

## 2011-11-02 NOTE — ED Notes (Addendum)
C/o CP, onset at 0900, relates to stress, decreased with ntg, last taken at ~1645, also sob, nausea, weakness, dizziness, diahoresis and back pain. 81mg  ASA taken with ntg. LS CTA, no pedal edema noted, pedal pulses palpable. Fatty deposit on chest has been present since CABG, described as "seems to be getting larger & nothing new".

## 2011-11-02 NOTE — ED Notes (Addendum)
Pt. Reports having chest pain at approx 0900. Denies chest pain currently . Denies headache. Denies nausea. Reports "feeling SOB all the time".  Respirations even and  Unlabored. Lung sounds clear. SpO2 currently 98% on RA. Pt. Reports she is not on O2 @ home.  Informed that if SpO2 dips below 95% we will oxygenate. Reports swelling and discomfort in ankles bilaterally. Non tender on palpation. Denies any other pain. No complaints at this time. Updated on plan of care: aware that we are currently awaiting images. And that the EDP will discuss the results with the Pt.  Vitals stable. A.O. X 4. NAD. Family at bedside.

## 2011-11-02 NOTE — ED Notes (Signed)
EMT at side drawing blood

## 2011-11-02 NOTE — ED Notes (Signed)
Patient currently in x-ray; waiting for patient arrival in room.

## 2011-11-03 ENCOUNTER — Encounter (HOSPITAL_COMMUNITY): Payer: Self-pay | Admitting: *Deleted

## 2011-11-03 DIAGNOSIS — R079 Chest pain, unspecified: Secondary | ICD-10-CM

## 2011-11-03 DIAGNOSIS — N184 Chronic kidney disease, stage 4 (severe): Secondary | ICD-10-CM

## 2011-11-03 LAB — CARDIAC PANEL(CRET KIN+CKTOT+MB+TROPI)
CK, MB: 3.2 ng/mL (ref 0.3–4.0)
CK, MB: 4 ng/mL (ref 0.3–4.0)
Troponin I: <0.3 ng/mL (ref <0.30)

## 2011-11-03 MED ORDER — SODIUM CHLORIDE 0.9 % IJ SOLN: 3.0000 mL | Freq: Two times a day (BID) | INTRAMUSCULAR | Status: DC

## 2011-11-03 MED ORDER — AMLODIPINE BESYLATE 5 MG PO TABS
5.0000 mg | ORAL_TABLET | Freq: Every day | ORAL | Status: DC
  Administered 2011-11-03: 5 mg via ORAL
  Filled 2011-11-03: qty 1

## 2011-11-03 MED ORDER — LEVETIRACETAM 250 MG PO TABS
250.0000 mg | ORAL_TABLET | Freq: Every day | ORAL | Status: DC
  Filled 2011-11-03: qty 1

## 2011-11-03 MED ORDER — NITROGLYCERIN 2 % TD OINT
0.5000 [in_us] | TOPICAL_OINTMENT | Freq: Four times a day (QID) | TRANSDERMAL | Status: DC
  Administered 2011-11-03: 0.5 [in_us] via TOPICAL
  Filled 2011-11-03: qty 30

## 2011-11-03 MED ORDER — ONDANSETRON HCL 4 MG/2ML IJ SOLN: 4.0000 mg | Freq: Four times a day (QID) | INTRAMUSCULAR | Status: DC | PRN

## 2011-11-03 MED ORDER — SODIUM CHLORIDE 0.9 % IJ SOLN
3.0000 mL | Freq: Two times a day (BID) | INTRAMUSCULAR | Status: DC
  Administered 2011-11-03: 3 mL via INTRAVENOUS

## 2011-11-03 MED ORDER — ONDANSETRON HCL 4 MG PO TABS: 4.0000 mg | ORAL_TABLET | Freq: Four times a day (QID) | ORAL | Status: DC | PRN

## 2011-11-03 MED ORDER — MORPHINE SULFATE 4 MG/ML IJ SOLN
4.0000 mg | Freq: Once | INTRAMUSCULAR | Status: AC
  Administered 2011-11-03: 4 mg via INTRAVENOUS
  Filled 2011-11-03: qty 1

## 2011-11-03 MED ORDER — SODIUM CHLORIDE 0.9 % IV SOLN: 250.0000 mL | INTRAVENOUS | Status: DC | PRN

## 2011-11-03 MED ORDER — ATORVASTATIN CALCIUM 10 MG PO TABS
10.0000 mg | ORAL_TABLET | Freq: Every day | ORAL | Status: DC
  Filled 2011-11-03: qty 1

## 2011-11-03 MED ORDER — ALPRAZOLAM 0.25 MG PO TABS
0.1250 mg | ORAL_TABLET | Freq: Every day | ORAL | Status: DC | PRN
Start: 2011-11-03 — End: 2011-11-03

## 2011-11-03 MED ORDER — DOCUSATE SODIUM 100 MG PO CAPS
200.0000 mg | ORAL_CAPSULE | Freq: Every day | ORAL | Status: DC
  Filled 2011-11-03: qty 2

## 2011-11-03 MED ORDER — HYDROCODONE-ACETAMINOPHEN 10-325 MG PO TABS
1.0000 | ORAL_TABLET | Freq: Four times a day (QID) | ORAL | Status: DC | PRN
  Administered 2011-11-03 (×2): 1 via ORAL
  Filled 2011-11-03 (×2): qty 1

## 2011-11-03 MED ORDER — NITROGLYCERIN 2 % TD OINT: 0.5000 [in_us] | TOPICAL_OINTMENT | Freq: Four times a day (QID) | TRANSDERMAL | Status: DC

## 2011-11-03 MED ORDER — ASPIRIN EC 81 MG PO TBEC
81.0000 mg | DELAYED_RELEASE_TABLET | Freq: Every day | ORAL | Status: DC
  Filled 2011-11-03: qty 1

## 2011-11-03 MED ORDER — SODIUM CHLORIDE 0.9 % IJ SOLN: 3.0000 mL | INTRAMUSCULAR | Status: DC | PRN

## 2011-11-03 MED ORDER — MORPHINE SULFATE 2 MG/ML IJ SOLN: 1.0000 mg | INTRAMUSCULAR | Status: DC | PRN

## 2011-11-03 NOTE — ED Notes (Addendum)
Pt. Transported by Nira Conn, RN on phillips monitor. Respirations even and unlabored. Vitals stable. NAD.

## 2011-11-03 NOTE — ED Notes (Signed)
Patient currently sitting up in bed; no respiratory or acute distress noted.  Patient updated on plan of care; informed patient that EDP is currently making a consult to Cardiologist/Internal Medicine.  Patient has no other questions or concerns at this time; will continue to monitor.

## 2011-11-03 NOTE — H&P (Signed)
PCP:   Wende Neighbors, MD   Chief Complaint:  cp  HPI: 78 yo female s/p cabg 1989 comes in with sscp that has occurred most of the day that she says in pressure that radiates to left shoulder with associated nausea no vomoting, no sob.  She currenlty has received iv pain meds and ntg in ed and has had relief.  Heart cath has been offered by cardiology service but pt wishes medical management only at this time.  No recent fevers, cough, le edema.    Review of Systems:  O/w neg  Past Medical History: Past Medical History  Diagnosis Date  . Mixed hyperlipidemia   . 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 Essential hypertension, benign   . Postsurgical aortocoronary bypass status   . Bladder cancer   . Seizure   . Dizziness   . Obesity   . Back pain   . CAD (coronary artery disease)    Past Surgical History  Procedure Date  . Cataract extraction   . Coronary artery bypass graft   . Iridectomy     Medications: Prior to Admission medications   Medication Sig Start Date End Date Taking? Authorizing Provider  ALPRAZolam Duanne Moron) 0.5 MG tablet Take 0.125-0.5 mg by mouth daily as needed. For nerves   Yes Historical Provider, MD  amLODipine (NORVASC) 5 MG tablet Take 5 mg by mouth daily.   Yes Historical Provider, MD  aspirin EC 81 MG tablet Take 81 mg by mouth at bedtime.    Yes Historical Provider, MD  CRESTOR 20 MG tablet Take 1 tablet by mouth at bedtime.  01/06/11  Yes Historical Provider, MD  docusate sodium (COLACE) 100 MG capsule Take 200 mg by mouth at bedtime.   Yes Historical Provider, MD  HYDROcodone-acetaminophen (LORTAB) 7.5-500 MG per tablet Take 1.5 tablets by mouth every 6 (six) hours as needed. For pain 05/03/11  Yes Delfina Redwood, MD  l-methylfolate-B6-B12 (METANX) 3-35-2 MG TABS Take 1 tablet by mouth at bedtime.    Yes Historical Provider, MD  levETIRAcetam (KEPPRA) 250 MG tablet Take 1 tablet by mouth at bedtime.  01/06/11  Yes Historical Provider, MD  Multiple  Vitamins-Minerals (ICAPS PO) Take 1 tablet by mouth at bedtime.   Yes Historical Provider, MD  nitroGLYCERIN (NITROSTAT) 0.4 MG SL tablet Place 0.4 mg under the tongue every 5 (five) minutes as needed. For chest pain 01/14/11 01/14/12 Yes Josue Hector, MD  temazepam (RESTORIL) 15 MG capsule Take 15 mg by mouth at bedtime as needed. For sleep   Yes Historical Provider, MD    Allergies:   Allergies  Allergen Reactions  . Penicillins Hives and Itching  . Sulfa Antibiotics Hives and Itching    Social History:  reports that she has never smoked. She does not have any smokeless tobacco history on file. She reports that she does not drink alcohol or use illicit drugs.  Family History: Family History  Problem Relation Age of Onset  . Stroke    . Coronary artery disease    . Cancer    . Diabetes      Physical Exam: Filed Vitals:   11/02/11 2315 11/02/11 2345 11/02/11 2345 11/02/11 2351  BP: 172/67 158/89 158/89 151/64  Pulse: 69 68 70 73  Temp:      TempSrc:      Resp: 18 21 17 20   SpO2: 100% 97% 96% 95%   General appearance: alert, cooperative and no distress Neck: no adenopathy, no carotid bruit, no  JVD and supple, symmetrical, trachea midline Lungs: clear to auscultation bilaterally Heart: regular rate and rhythm, S1, S2 normal, no murmur, click, rub or gallop Abdomen: soft, non-tender; bowel sounds normal; no masses,  no organomegaly Extremities: extremities normal, atraumatic, no cyanosis or edema Pulses: 2+ and symmetric Skin: Skin color, texture, turgor normal. No rashes or lesions Neurologic: Grossly normal  ekg nsr no acute changes    Labs on Admission:   [facility] 11/02/11 2020  NA 142  K 5.3*  CL 107  CO2 --  GLUCOSE 123*  BUN 29*  CREATININE 1.90*  CALCIUM --  MG --  PHOS --    Basename 11/02/11 2020 11/02/11 1932  WBC -- 8.4  NEUTROABS -- 3.9  HGB 11.6* 13.9  HCT 34.0* 41.1  MCV -- 86.3  PLT -- 181    Basename 11/02/11 1932  CKTOTAL 186*    CKMB 3.6  CKMBINDEX --  TROPONINI --   Radiological Exams on Admission: Dg Chest 2 View  11/02/2011  *RADIOLOGY REPORT*  Clinical Data: Chest pain.  CHEST - 2 VIEW  Comparison: 05/26/2007.  Findings: The heart is mildly enlarged but stable.  The mediastinal and hilar contours are unchanged.  Stable surgical changes from bypass surgery.  There are chronic bronchitic type lung changes but no acute overlying pulmonary process.  No pleural effusion.  A small Bochdalek hernia is noted on the left.  Vertebral augmentation changes are noted in the L1 vertebral body.  The bony thorax is intact.  IMPRESSION: Chronic bronchitic type lung changes but no acute pulmonary findings.  Original Report Authenticated By: P. Kalman Jewels, M.D.    Assessment/Plan Present on Admission:  78 yo female with cp h/o cabg .Chest pain .CAD (coronary artery disease) .CKD (chronic kidney disease), stage IV .Benign hypertension .Anxiety .Seizure disorder  Romi, tele, asa, ntg paste.  ce neg.  ekg neg.  Cards consulted and following.  Cp free currently.    Loisann Roach A [id number] 11/03/2011, 12:51 AM

## 2011-11-03 NOTE — Progress Notes (Signed)
Subjective: Denies any chest pain or shortness of breath.  No specific complaints.  Objective: Vital signs in last 24 hours: Filed Vitals:   11/02/11 2345 11/02/11 2351 11/03/11 0243 11/03/11 0331  BP: 158/89 151/64 125/91 153/79  Pulse: 70 73 65 67  Temp:   98.4 F (36.9 C) 98.1 F (36.7 C)  TempSrc:   Oral Oral  Resp: 17 20 15 20   Height:    5\' 3"  (1.6 m)  Weight:    82.7 kg (182 lb 5.1 oz)  SpO2: 96% 95% 98% 97%   Weight change:  No intake or output data in the 24 hours ending 11/03/11 0926  Physical Exam: General: Awake, Oriented, No acute distress. HEENT: EOMI. Neck: Supple CV: S1 and S2 Lungs: Clear to ascultation bilaterally Abdomen: Soft, Nontender, Nondistended, +bowel sounds. Ext: Good pulses. Trace edema.  Lab Results: Basic Metabolic Panel:  Lab [id number] 2020  NA 142  K 5.3*  CL 107  CO2 --  GLUCOSE 123*  BUN 29*  CREATININE 1.90*  CALCIUM --  MG --  PHOS --   Liver Function Tests: No results found for this basename: AST:5,ALT:5,ALKPHOS:5,BILITOT:5,PROT:5,ALBUMIN:5 in the last 168 hours No results found for this basename: LIPASE:5,AMYLASE:5 in the last 168 hours No results found for this basename: AMMONIA:5 in the last 168 hours CBC:  Lab 11/02/11 2020 11/02/11 1932  WBC -- 8.4  NEUTROABS -- 3.9  HGB 11.6* 13.9  HCT 34.0* 41.1  MCV -- 86.3  PLT -- 181   Cardiac Enzymes:  Lab 11/03/11 0550 11/02/11 1932  CKTOTAL 167 186*  CKMB 3.2 3.6  CKMBINDEX -- --  TROPONINI <0.30 --   BNP (last 3 results)  Basename 11/02/11 1932  PROBNP 263.2   CBG: No results found for this basename: GLUCAP:5 in the last 168 hours No results found for this basename: HGBA1C:5 in the last 72 hours Other Labs: No components found with this basename: POCBNP:3 No results found for this basename: DDIMER:2 in the last 168 hours No results found for this basename: CHOL:2,HDL:2,LDLCALC:2,TRIG:2,CHOLHDL:2,LDLDIRECT:2 in the last 168 hours No results found for this  basename: TSH,T4TOTAL,FREET3,T3FREE,FREET4,THYROIDAB in the last 168 hours No results found for this basename: VITAMINB12:2,FOLATE:2,FERRITIN:2,TIBC:2,IRON:2,RETICCTPCT:2 in the last 168 hours  Micro Results: No results found for this or any previous visit (from the past 240 hour(s)).  Studies/Results: Dg Chest 2 View  11/02/2011  *RADIOLOGY REPORT*  Clinical Data: Chest pain.  CHEST - 2 VIEW  Comparison: 05/26/2007.  Findings: The heart is mildly enlarged but stable.  The mediastinal and hilar contours are unchanged.  Stable surgical changes from bypass surgery.  There are chronic bronchitic type lung changes but no acute overlying pulmonary process.  No pleural effusion.  A small Bochdalek hernia is noted on the left.  Vertebral augmentation changes are noted in the L1 vertebral body.  The bony thorax is intact.  IMPRESSION: Chronic bronchitic type lung changes but no acute pulmonary findings.  Original Report Authenticated By: P. Kalman Jewels, M.D.    Medications: I have reviewed the patient's current medications. Scheduled Meds:   . amLODipine  5 mg Oral Daily  . aspirin  324 mg Oral Once  . aspirin EC  81 mg Oral QHS  . atorvastatin  10 mg Oral q1800  . docusate sodium  200 mg Oral QHS  . levETIRAcetam  250 mg Oral QHS  .  morphine injection  4 mg Intravenous Once  . nitroGLYCERIN  0.5 inch Topical Q6H  . sodium chloride  3  mL Intravenous Q12H  . sodium chloride  3 mL Intravenous Q12H  . DISCONTD: nitroGLYCERIN  0.5 inch Topical Q6H   Continuous Infusions:  PRN Meds:.sodium chloride, ALPRAZolam, HYDROcodone-acetaminophen, morphine injection, nitroGLYCERIN, ondansetron (ZOFRAN) IV, ondansetron, sodium chloride  Assessment/Plan: Atypical chest pain Initial troponin negative, continue to cycle cardiac enzymes to the patient for acute coronary syndrome.  Appreciate cardiology input.  Per cardiology if she is able to ambulate without any difficulties and if cardiac enzymes are  negative can likely be discharged home with followup with Dr. Johnsie Cancel in few weeks.  Coronary artery disease status post CABG Continue aspirin.  Management as indicated above.  Not on beta blocker, etiology unclear, patient's heart rate in the 60s at baseline.  Seizure disorder Continue Levetiricetam.  Blood pressure stable.  Hypertension Continue amlodipine.  Hyperlipidemia Continue statin.  Anxiety Stable.  CKD (chronic kidney disease), stage IV Stable.  Improved from previously available baseline creatinine.  Prophylaxis SCDs.  Disposition If troponin is negative and if patient is able to ambulate without difficulty consider discharge later today with outpatient follow with cardiology.   LOS: 1 day  Umer Harig A, MD 11/03/2011, 9:26 AM

## 2011-11-03 NOTE — Progress Notes (Signed)
Utilization review complete 

## 2011-11-03 NOTE — ED Notes (Addendum)
Patient currently sitting up in bed; no respiratory or acute distress noted.  Patient complaining of 6/10 chest pain; Dr. Christy Gentles notified.  Hospitalist now at bedside.  Will continue to monitor.

## 2011-11-03 NOTE — Progress Notes (Signed)
PROGRESS NOTE  Subjective:   Pt has a hx of CAD, CABG admitted with CP ( different than her angina).  Cardiac enzymes are negative so far.  Objective:    Vital Signs:   Temp:  [98.1 F (36.7 C)-98.7 F (37.1 C)] 98.1 F (36.7 C) (07/29 0331) Pulse Rate:  [65-76] 67  (07/29 0331) Resp:  [15-21] 20  (07/29 0331) BP: (125-193)/(63-93) 153/79 mmHg (07/29 0331) SpO2:  [95 %-100 %] 97 % (07/29 0331) Weight:  [182 lb 5.1 oz (82.7 kg)] 182 lb 5.1 oz (82.7 kg) (07/29 0331)  Last BM Date: 11/02/11   24-hour weight change: Weight change:   Weight trends: Filed Weights   11/03/11 0331  Weight: 182 lb 5.1 oz (82.7 kg)    Intake/Output:        Physical Exam: BP 153/79  Pulse 67  Temp 98.1 F (36.7 C) (Oral)  Resp 20  Ht 5\' 3"  (1.6 m)  Wt 182 lb 5.1 oz (82.7 kg)  BMI 32.30 kg/m2  SpO2 97%  General: Vital signs reviewed and noted. Well-developed, well-nourished, in no acute distress; alert, appropriate and cooperative .  Head: Normocephalic, atraumatic.  Eyes: conjunctivae/corneas clear.  EOM's intact.   Throat: normal  Neck: Supple. Normal carotids. No JVD  Lungs:  Clear to auscultation  Heart: Regular rate,  With normal  S1 S2. No murmurs, gallops or rubs.  She has a soft lipomatous like mass on her anterior chest   Abdomen:  Soft, non-tender, non-distended with normoactive bowel sounds. No hepatomegaly. No rebound/guarding. No abdominal masses.  Extremities: Distal pedal pulses are 2+ .  No edema.    Neurologic: A&O X3, CN II - XII are grossly intact. Motor strength is 5/5 in the all 4 extremities.  Psych: Responds to questions appropriately with normal affect.    Labs: BMET:  Basename 11/02/11 2020  NA 142  K 5.3*  CL 107  CO2 --  GLUCOSE 123*  BUN 29*  CREATININE 1.90*  CALCIUM --  MG --  PHOS --    Liver function tests: No results found for this basename: AST:2,ALT:2,ALKPHOS:2,BILITOT:2,PROT:2,ALBUMIN:2 in the last 72 hours No results found for  this basename: LIPASE:2,AMYLASE:2 in the last 72 hours  CBC:  Basename 11/02/11 2020 11/02/11 1932  WBC -- 8.4  NEUTROABS -- 3.9  HGB 11.6* 13.9  HCT 34.0* 41.1  MCV -- 86.3  PLT -- 181    Cardiac Enzymes:  Basename 11/03/11 0550 11/02/11 1932  CKTOTAL 167 186*  CKMB 3.2 3.6  TROPONINI <0.30 --    Coagulation Studies: No results found for this basename: LABPROT:5,INR:5 in the last 72 hours  Other:    Tele:  NSR  Medications:    Infusions:    Scheduled Medications:    . amLODipine  5 mg Oral Daily  . aspirin  324 mg Oral Once  . aspirin EC  81 mg Oral QHS  . atorvastatin  10 mg Oral q1800  . docusate sodium  200 mg Oral QHS  . levETIRAcetam  250 mg Oral QHS  .  morphine injection  4 mg Intravenous Once  . nitroGLYCERIN  0.5 inch Topical Q6H  . sodium chloride  3 mL Intravenous Q12H  . sodium chloride  3 mL Intravenous Q12H  . DISCONTD: nitroGLYCERIN  0.5 inch Topical Q6H    Assessment/ Plan:    1. Chest pain:  Atypical.  The shooting pains only last a second.  She is able to ambulate without problems at home.  Would ambulate her around the ward today and DC if she does not have any other problems.  With her creatinine of 1.9, I would hesitate to do anything further unless she has more typical symptoms or + cardiac enzymes   Disposition: DC to home when OK with medicine.  Dr. Johnsie Cancel / PA can see her in the office in several weeks - sooner if needed. Will sign off.  Call for questions.  Length of Stay: 1  Thayer Headings, Brooke Bonito., MD, [facility] 11/03/2011, 8:29 AM Office [phone] Pager [phone]

## 2011-11-03 NOTE — Consult Note (Signed)
Reason for Consult: Chest Pain Referring Physician: [facility] Hospitalist Team [address] [name] [name] is an 78 y.o. female.  HPI: 78 yo woman with PMH of HLD, HTN, CAD s/p LIMA to LAD '89 who comes in with slightly different chest pain. She tells me she has been doing well at home. She occasionally has some brief sharper chest pains on the left side that occur unrelated to exertion; however, this pain is more substernal (slightly left-sided), some shoulder involvement and more pressure-like. This pain is not like pain she had around her MI and bypass surgery. Otherwise, no nausea/vomiting/diarrhea, no sick contacts, no real change in exercise capacity (she can walk around her circle driveway 3x which is less than half a half) - she never walks a full flight of stairs. She is not excited about pursuing cardiac catheterization should that be necessary and actually prefers to go home if we think that would be ok.   Past Medical History  Diagnosis Date  . Mixed hyperlipidemia   . 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 Essential hypertension, benign   . Postsurgical aortocoronary bypass status   . Bladder cancer   . Seizure   . Dizziness   . Obesity   . Back pain   . CAD (coronary artery disease)     Past Surgical History  Procedure Date  . Cataract extraction   . Coronary artery bypass graft   . Iridectomy     Family History  Problem Relation Age of Onset  . Stroke    . Coronary artery disease    . Cancer    . Diabetes      Social History:  reports that she has never smoked. She does not have any smokeless tobacco history on file. She reports that she does not drink alcohol or use illicit drugs.  Allergies:  Allergies  Allergen Reactions  . Penicillins Hives and Itching  . Sulfa Antibiotics Hives and Itching    Medications:  I have reviewed the patient's current medications.   Review of Systems  Constitutional: Negative for fever, chills, weight loss and malaise/fatigue.  HENT: Negative for  hearing loss and tinnitus.   Eyes: Negative for blurred vision, double vision and photophobia.  Respiratory: Negative for cough, hemoptysis and sputum production.   Cardiovascular: Positive for chest pain and leg swelling. Negative for palpitations, orthopnea and claudication.  Gastrointestinal: Negative for nausea, vomiting and abdominal pain.  Genitourinary: Negative for dysuria, urgency and frequency.  Musculoskeletal: Positive for back pain. Negative for myalgias.  Skin: Negative for itching and rash.  Neurological: Negative for dizziness, tingling, tremors, focal weakness and headaches.  Endo/Heme/Allergies: Negative for environmental allergies. Does not bruise/bleed easily.  Psychiatric/Behavioral: Negative for depression, suicidal ideas and substance abuse.   Blood pressure 151/64, pulse 73, temperature 98.7 F (37.1 C), temperature source Oral, resp. rate 20, SpO2 95.00%. Physical Exam  Nursing note and vitals reviewed. Constitutional: She is oriented to person, place, and time. She appears well-developed and well-nourished. No distress.  HENT:  Head: Normocephalic and atraumatic.  Nose: Nose normal.  Mouth/Throat: Oropharynx is clear and moist. No oropharyngeal exudate.  Eyes: Conjunctivae and EOM are normal. Pupils are equal, round, and reactive to light. No scleral icterus.  Neck: Normal range of motion. Neck supple. No JVD present. No tracheal deviation present. No thyromegaly present.  Cardiovascular: Normal rate, regular rhythm and intact distal pulses.  Exam reveals no gallop.   Murmur heard.      Soft systolic murmur heard at  LSB  Respiratory: Effort normal and breath sounds normal. No respiratory distress. She has no wheezes. She has no rales.  GI: Soft. Bowel sounds are normal. She exhibits no distension. There is no tenderness.  Musculoskeletal: Normal range of motion. She exhibits edema. She exhibits no tenderness.       Trace LEE bilaterally; distal pulses 1-2+  bilaterally  Neurological: She is alert and oriented to person, place, and time. No cranial nerve deficit. Coordination normal.  Skin: Skin is warm and dry. No rash noted. No erythema.  Psychiatric: She has a normal mood and affect. Her behavior is normal.  Labs reviewed; na 142, K 5.3, bun/cr 29/1.9, h/h 11.6/34 from 13.9/41.1, plt 181 Troponin negative Chest x-ray - chronic changes bilaterally lower lobe EKG reviewed and no acute ST changes  Problem List Chest Pain Coronary Artery Disease s/p LIMA to LAD '89 Chronic Kidney Disease Stage III/IV Anemia K 5.3 - ? Hyperkalemia Recent Bladder Cancer Prior Right Nephrectomy Hypertension Dyslipidemia  Assessment/Plan: 78 yo woman with PMH of CAD s/p LIMA to LAD '89 with slightly different chest pain today than typically. The differential diagnosis is atypical chest pain, unstable angina, musculoskeletal pain, hypertension mediated with increased oxygen demand/supply mismatch among other etiologies. We had a long chat (patient with family/friends at bedside) and she is averse to pursuing cardiac catheterization should this be warranted given her one remaining kidney with risk of contrast nephropathy. When I asked if she would want a cardiac catheterization should this option alleviate her chest pain or even make her live longer she was still quite apprehensive. At this time, I prefer to treat her hypertension, trend troponins and if she continues to have pain, then would strongly consider heparinization (I discussed this option and she will consider medical therapy should we feel it is warranted).  - telemetry - trend troponin/CKMB - continue aspirin 81 mg, asa 324 g given in ER - statin continued - crestor 20 mg, check lipid panel, hba1c, tsh - continue amlodipine 5 mg - consider increase to 10 mg for improved Hypertension therapy - beta blocker reasonable to initiate, consider initiating low dose coreg 3.125 mg bid as tolerated  - initiate  heparin if chest pain restarts/continues or enzymes trend up  [place] 11/03/2011, 1:17 AM

## 2011-11-03 NOTE — ED Notes (Signed)
Patient currently sitting up in bed; no respiratory or acute distress noted.  Patient updated on plan of care; informed patient that we are currently waiting on further orders from [place].  Patient has no other questions or concerns at this time.  Will continue to monitor.

## 2011-11-03 NOTE — ED Notes (Signed)
Cardiologist at bedside.  

## 2011-11-03 NOTE — ED Notes (Signed)
All belongings sent home with pt's sister.

## 2011-11-03 NOTE — Discharge Summary (Signed)
Physician Discharge Summary  [name] [name] [id number] DOB: 24-Feb-1929 DOA: 11/02/2011  PCP: Wende Neighbors, MD Patient's cardiologist: Dr. Johnsie Cancel  Admit date: 11/02/2011 Discharge date: 11/03/2011  Recommendations for Outpatient Follow-up:  1. Followup with your cardiologist Dr. Johnsie Cancel, for further workup.  Discharge Diagnoses:  Principal Problem:  *Chest pain Active Problems:  Postsurgical aortocoronary bypass status  Seizure disorder  Benign hypertension  CAD (coronary artery disease)  Anxiety  CKD (chronic kidney disease), stage IV  Discharge Condition: Stable  Diet recommendation: Heart healthy diet  Wt Readings from Last 3 Encounters:  11/03/11 82.7 kg (182 lb 5.1 oz)  04/29/11 77.1 kg (169 lb 15.6 oz)  01/14/11 81.194 kg (179 lb)   History of present illness:  On admission: "78 yo female s/p cabg 1989 comes in with sscp that has occurred most of the day that she says in pressure that radiates to left shoulder with associated nausea no vomoting, no sob. She currenlty has received iv pain meds and ntg in ed and has had relief. Heart cath has been offered by cardiology service but pt wishes medical management only at this time. No recent fevers, cough, le edema."  Hospital Course:  Atypical chest pain  Troponin negative x2, ruled the patient out for acute coronary syndrome. Dr. Acie Fredrickson, cardiology evaluated the patient and recommended that if she is able to ambulate without any difficulty and if no recurrence of chest pain, patient can be discharged home with followup with Dr. Johnsie Cancel, cardiology, in 1-2 weeks.   Coronary artery disease status post CABG  Continue aspirin. Management as indicated above. Not on beta blocker, etiology unclear, patient's heart rate in the 60s at baseline.   Seizure disorder  Continue Levetiricetam. Blood pressure stable.   Hypertension  Continue amlodipine.   Hyperlipidemia  Continue statin.   Anxiety  Stable.   CKD (chronic kidney  disease), stage IV  Stable. Improved from previously available baseline creatinine.   Procedures:  None  Consultations:  Dr. Acie Fredrickson, Cardiology  Discharge Exam: Filed Vitals:   11/03/11 1301  BP: 112/57  Pulse: 68  Temp: 98.4 F (36.9 C)  Resp: 17   Filed Vitals:   11/02/11 2351 11/03/11 0243 11/03/11 0331 11/03/11 1301  BP: 151/64 125/91 153/79 112/57  Pulse: 73 65 67 68  Temp:  98.4 F (36.9 C) 98.1 F (36.7 C) 98.4 F (36.9 C)  TempSrc:  Oral Oral   Resp: 20 15 20 17   Height:   5\' 3"  (1.6 m)   Weight:   82.7 kg (182 lb 5.1 oz)   SpO2: 95% 98% 97% 96%   Discharge Instructions  Discharge Orders    Future Orders Please Complete By Expires   Diet - low sodium heart healthy      Increase activity slowly      Discharge instructions      Comments:   Followup with your cardiologist (Dr. Johnsie Cancel) in 1 week. Followup with HALL,ZACK, MD (PCP) in 1 week for post-hospitalization care.     Medication List  As of 11/03/2011  1:22 PM   TAKE these medications         ALPRAZolam 0.5 MG tablet   Commonly known as: XANAX   Take 0.125-0.5 mg by mouth daily as needed. For nerves      amLODipine 5 MG tablet   Commonly known as: NORVASC   Take 5 mg by mouth daily.      aspirin EC 81 MG tablet   Take 81 mg by mouth at  bedtime.      CRESTOR 20 MG tablet   Generic drug: rosuvastatin   Take 1 tablet by mouth at bedtime.      docusate sodium 100 MG capsule   Commonly known as: COLACE   Take 200 mg by mouth at bedtime.      HYDROcodone-acetaminophen 7.5-500 MG per tablet   Commonly known as: LORTAB   Take 1.5 tablets by mouth every 6 (six) hours as needed. For pain      ICAPS PO   Take 1 tablet by mouth at bedtime.      l-methylfolate-B6-B12 3-35-2 MG Tabs   Commonly known as: METANX   Take 1 tablet by mouth at bedtime.      levETIRAcetam 250 MG tablet   Commonly known as: KEPPRA   Take 1 tablet by mouth at bedtime.      nitroGLYCERIN 0.4 MG SL tablet   Commonly  known as: NITROSTAT   Place 0.4 mg under the tongue every 5 (five) minutes as needed. For chest pain      temazepam 15 MG capsule   Commonly known as: RESTORIL   Take 15 mg by mouth at bedtime as needed. For sleep              The results of significant diagnostics from this hospitalization (including imaging, microbiology, ancillary and laboratory) are listed below for reference.    Significant Diagnostic Studies: Dg Chest 2 View  11/02/2011  *RADIOLOGY REPORT*  Clinical Data: Chest pain.  CHEST - 2 VIEW  Comparison: 05/26/2007.  Findings: The heart is mildly enlarged but stable.  The mediastinal and hilar contours are unchanged.  Stable surgical changes from bypass surgery.  There are chronic bronchitic type lung changes but no acute overlying pulmonary process.  No pleural effusion.  A small Bochdalek hernia is noted on the left.  Vertebral augmentation changes are noted in the L1 vertebral body.  The bony thorax is intact.  IMPRESSION: Chronic bronchitic type lung changes but no acute pulmonary findings.  Original Report Authenticated By: P. Kalman Jewels, M.D.    Microbiology: No results found for this or any previous visit (from the past 240 hour(s)).   Labs: Basic Metabolic Panel:  Lab [id number] 2020  NA 142  K 5.3*  CL 107  CO2 --  GLUCOSE 123*  BUN 29*  CREATININE 1.90*  CALCIUM --  MG --  PHOS --   Liver Function Tests: No results found for this basename: AST:5,ALT:5,ALKPHOS:5,BILITOT:5,PROT:5,ALBUMIN:5 in the last 168 hours No results found for this basename: LIPASE:5,AMYLASE:5 in the last 168 hours No results found for this basename: AMMONIA:5 in the last 168 hours CBC:  Lab 11/02/11 2020 11/02/11 1932  WBC -- 8.4  NEUTROABS -- 3.9  HGB 11.6* 13.9  HCT 34.0* 41.1  MCV -- 86.3  PLT -- 181   Cardiac Enzymes:  Lab 11/03/11 1124 11/03/11 0550 11/02/11 1932  CKTOTAL 195* 167 186*  CKMB 4.0 3.2 3.6  CKMBINDEX -- -- --  TROPONINI <0.30 <0.30 --    BNP: BNP (last 3 results)  Basename 11/02/11 1932  PROBNP 263.2   CBG: No results found for this basename: GLUCAP:5 in the last 168 hours  Time coordinating discharge: 25 minutes  Signed:  Zadrian Mccauley A  [facility] 11/03/2011, 1:22 PM

## 2011-11-03 NOTE — ED Notes (Signed)
Pt. Sitting up in bed. Respirations even and unlabored. Skin warm dry and intact. NS running @ KVO. Denies pain. A.O. X 4. NAD. Updated on plan of care: Bed assignment has been made. Will move Pt.  To floor once report is called.

## 2011-11-03 NOTE — ED Notes (Signed)
Chest pain unrelieved by nitro x 3tabs. EDP aware.

## 2011-11-06 ENCOUNTER — Encounter: Payer: Self-pay | Admitting: Cardiovascular Disease

## 2011-11-06 ENCOUNTER — Ambulatory Visit (INDEPENDENT_AMBULATORY_CARE_PROVIDER_SITE_OTHER): Payer: Medicare Other | Admitting: Cardiovascular Disease

## 2011-11-06 VITALS — BP 136/73 | HR 70 | Wt 182.0 lb

## 2011-11-06 DIAGNOSIS — I10 Essential (primary) hypertension: Secondary | ICD-10-CM

## 2011-11-06 DIAGNOSIS — I2510 Atherosclerotic heart disease of native coronary artery without angina pectoris: Secondary | ICD-10-CM

## 2011-11-06 DIAGNOSIS — E785 Hyperlipidemia, unspecified: Secondary | ICD-10-CM

## 2011-11-06 DIAGNOSIS — Z905 Acquired absence of kidney: Secondary | ICD-10-CM

## 2011-11-06 NOTE — Assessment & Plan Note (Signed)
F/U [name]  Baseline Cr 1.4-1.6

## 2011-11-06 NOTE — Patient Instructions (Signed)
Your physician wants you to follow-up in:  6 MONTHS WITH DR NISHAN  You will receive a reminder letter in the mail two months in advance. If you don't receive a letter, please call our office to schedule the follow-up appointment. Your physician recommends that you continue on your current medications as directed. Please refer to the Current Medication list given to you today. 

## 2011-11-06 NOTE — Assessment & Plan Note (Signed)
Cholesterol is at goal.  Continue current dose of statin and diet Rx.  No myalgias or side effects.  F/U  LFT's in 6 months. Lab Results  Component Value Date   [facility]  Value: 176        Total Cholesterol/HDL:CHD Risk Coronary Heart Disease Risk Table                     Men   Women  1/2 Average Risk   3.4   3.3* 04/22/2007

## 2011-11-06 NOTE — Progress Notes (Signed)
Patient ID: [name] [name], female   DOB: [birth year], 78 y.o.   MRN: [id number] [name] [name] has a distant history of [place] to LAD in [place] in 1989. I believe she subsequenlty required PCI of the native RCA. Cath here in 2004 showed no flow limiting disease in the RCA or Circ and a patent LIMA to the LAD. She has not had any SSCP. She has a stable lipoma over the sternum and a large abdominal wall hernia from a previous nephrectomy. She has seen Dr [name] [name] for her remaining kidney and her Cr is apparantly ok. We will get her blood work from Dr [name] [name] done last week. She thinks her LDL was 111 and on 20mg  of Crestor I would not increase her to 40mg . Needs refill on nitro. Hernia from previous right nephrectomy bothering her a bit. Cr around 1.6  Hospitalized 7/28 for SSCP  R/O and D/C without stress testing.   No pains since D/C.  She is under some stress living with son.  He has two teenage daughters that are causing some issues.   ROS: Denies fever, malais, weight loss, blurry vision, decreased visual acuity, cough, sputum, SOB, hemoptysis, pleuritic pain, palpitaitons, heartburn, abdominal pain, melena, lower extremity edema, claudication, or rash.  All other systems reviewed and negative  General: Affect appropriate Healthy:  appears stated age 7: normal Neck supple with no adenopathy JVP normal no bruits no thyromegaly Lipoma over chest wall Lungs clear with no wheezing and good diaphragmatic motion Heart:  S1/S2 no murmur, no rub, gallop or click PMI normal Abdomen: benighn, BS positve, no tenderness, no AAA no bruit.  No HSM or HJR Distal pulses intact with no bruits No edema Neuro non-focal Skin warm and dry No muscular weakness   Current Outpatient Prescriptions  Medication Sig Dispense Refill  . ALPRAZolam (XANAX) 0.5 MG tablet Take 0.125-0.5 mg by mouth daily as needed. For nerves      . amLODipine (NORVASC) 5 MG tablet Take 5 mg by mouth daily.      Marland Kitchen aspirin EC 81 MG tablet Take 81 mg by  mouth at bedtime.       . buprenorphine (BUTRANS) 10 MCG/HR PTWK Place 10 mcg onto the skin once a week.      . CRESTOR 20 MG tablet Take 1 tablet by mouth at bedtime.       . docusate sodium (COLACE) 50 MG capsule Take 100 mg by mouth 2 (two) times daily.      Marland Kitchen HYDROcodone-acetaminophen (LORTAB) 10-500 MG per tablet Take 1 tablet by mouth every 6 (six) hours as needed.      Marland Kitchen l-methylfolate-B6-B12 (METANX) 3-35-2 MG TABS Take 1 tablet by mouth at bedtime.       . levETIRAcetam (KEPPRA) 250 MG tablet Take 1 tablet by mouth at bedtime.       . Multiple Vitamins-Minerals (ICAPS PO) Take 1 tablet by mouth at bedtime.      . nitroGLYCERIN (NITROSTAT) 0.4 MG SL tablet Place 0.4 mg under the tongue every 5 (five) minutes as needed. For chest pain      . temazepam (RESTORIL) 15 MG capsule Take 15 mg by mouth at bedtime as needed. For sleep      . DISCONTD: nitroGLYCERIN (NITROSTAT) 0.4 MG SL tablet Place 1 tablet (0.4 mg total) under the tongue every 5 (five) minutes as needed for chest pain.  25 tablet  12    Allergies  Penicillins and Sulfa antibiotics  Electrocardiogram:  Assessment and Plan

## 2011-11-06 NOTE — Assessment & Plan Note (Signed)
Well controlled.  Continue current medications and low sodium Dash type diet.    

## 2011-11-06 NOTE — Assessment & Plan Note (Signed)
Stable with no angina and good activity level.  Continue medical Rx If more pain would do lexiscan myovue at [facility]

## 2011-11-07 ENCOUNTER — Encounter: Payer: Self-pay | Admitting: Cardiovascular Disease

## 2012-10-19 ENCOUNTER — Ambulatory Visit (INDEPENDENT_AMBULATORY_CARE_PROVIDER_SITE_OTHER): Payer: Medicare Other | Admitting: Nurse Practitioner

## 2012-10-19 ENCOUNTER — Encounter: Payer: Self-pay | Admitting: Nurse Practitioner

## 2012-10-19 VITALS — BP 161/74 | HR 67 | Temp 98.5°F | Ht 63.0 in | Wt 183.0 lb

## 2012-10-19 DIAGNOSIS — R569 Unspecified convulsions: Secondary | ICD-10-CM

## 2012-10-19 MED ORDER — LEVETIRACETAM 250 MG PO TABS: 250.0000 mg | ORAL_TABLET | Freq: Every day | ORAL | Status: DC

## 2012-10-19 NOTE — Patient Instructions (Signed)
Continue current medication.  Refill x 3 sent to [facility] in [place], Keppra 250mg  90 tablets.  Return in 1 year for follow up.

## 2012-10-19 NOTE — Progress Notes (Signed)
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 She had a questionable partial seizure at the time of the biopsy. She was placed on Keppra but has had difficulty with tolerance and has been excessively sleepy during the day with his medication dose was increased. She is currently taking only 250 mg per day. She admits to poor sleep. She has difficulty going to sleep and staying asleep. She is a long-standing problem with dizziness and sense of imbalance. She's been told to be careful when changing positions because of orthostatic hypotension. She has trouble walking, needs to have a cane or person assistance to walk. She says she has been told she has some vertigo in the past. New neurologic complaints. No seizure activity since last seen. ROS.  UPDATE 10/19/2012: Patient returns for followup since last visit on 10/20/2011. No reported changes since last visit. No new neurologic complaints. No seizure activity since last seen.  REVIEW OF SYSTEMS: Full 14 system review of systems performed and notable only for: constitutional: Fatigue cardiovascular: N/A respiratory: Short of breath endocrine: Feeling cold  ear/nose/throat: N/A  Eyes: Loss of vision musculoskeletal: Joint pain, aching muscles skin: N/A genitourinary: Incontinence, urination problems Gastrointestinal: Incontinence, constipation allergy/immunology: N/A neurological: N/A sleep: Insomnia psychiatric: Depression, anxiety, not enough sleep   ALLERGIES: Allergies  Allergen Reactions  . Penicillins Hives and Itching  . Sulfa Antibiotics Hives and Itching    HOME MEDICATIONS: Outpatient Prescriptions Prior to Visit    Medication Sig Dispense Refill  . ALPRAZolam (XANAX) 0.5 MG tablet Take 0.125-0.5 mg by mouth daily as needed. For nerves      . amLODipine (NORVASC) 5 MG tablet Take 5 mg by mouth daily.      Marland Kitchen aspirin EC 81 MG tablet Take 81 mg by mouth at bedtime.       . CRESTOR 20 MG tablet Take 1 tablet by mouth at bedtime.       . docusate sodium (COLACE) 50 MG capsule Take 100 mg by mouth 2 (two) times daily.      Marland Kitchen HYDROcodone-acetaminophen (LORTAB) 10-500 MG per tablet Take 1 tablet by mouth every 6 (six) hours as needed.      Marland Kitchen l-methylfolate-B6-B12 (METANX) 3-35-2 MG TABS Take 1 tablet by mouth at bedtime.       . Multiple Vitamins-Minerals (ICAPS PO) Take 1 tablet by mouth at bedtime.      . temazepam (RESTORIL) 15 MG capsule Take 15 mg by mouth at bedtime as needed. For sleep      . levETIRAcetam (KEPPRA) 250 MG tablet Take 1 tablet by mouth at bedtime.       . nitroGLYCERIN (NITROSTAT) 0.4 MG SL tablet Place 0.4 mg under the tongue every 5 (five) minutes as needed. For chest pain      . buprenorphine (BUTRANS) 10 MCG/HR PTWK Place 10 mcg onto the skin once a week.       No facility-administered medications prior to visit.    PAST MEDICAL HISTORY: Past Medical History  Diagnosis Date  . Mixed hyperlipidemia   . 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 Essential hypertension, benign   . Postsurgical aortocoronary bypass status   . Bladder cancer   . Seizure   .  Dizziness   . Obesity   . Back pain   . CAD (coronary artery disease)     PAST SURGICAL HISTORY: Past Surgical History  Procedure Laterality Date  . Cataract extraction    . Coronary artery bypass graft    . Iridectomy    . Nephrectomy  2000    FAMILY HISTORY: Family History  Problem Relation Age of Onset  . Stroke    . Coronary artery disease    . Cancer    . Diabetes    . Colon cancer Mother   . Heart attack Father   . Diabetes Father     SOCIAL HISTORY: History   Social History  . Marital Status: Widowed     Spouse Name: N/A    Number of Children: 3  . Years of Education: N/A   Occupational History  .      n/a   Social History Main Topics  . Smoking status: Never Smoker   . Smokeless tobacco: Never Used  . Alcohol Use: No  . Drug Use: No  . Sexually Active: No   Other Topics Concern  . Not on file   Social History Narrative   Patient lives at home with his son and daughter in law.   Caffeine Use: 2 cups of caffeine daily.      PHYSICAL EXAM  Filed Vitals:   10/19/12 1440  BP: 161/74  Pulse: 67  Temp: 98.5 F (36.9 C)  TempSrc: Oral  Height: 5\' 3"  (1.6 m)  Weight: 183 lb (83.008 kg)   Body mass index is 32.43 kg/(m^2).  Generalized: well-developed, well-nourished, seated, and no evidence of stress   Neck: Supple, no carotid bruits   Cardiac: Regular rate rhythm, no murmur   Pulmonary: Clear to auscultation bilaterally   Musculoskeletal: No deformity   Neurological examination   Mentation: Alert oriented to time, place, history taking, language fluent, and causual conversation  Cranial nerve II-XII: Pupils were equal round reactive to light extraocular movements were full, visual field were full on confrontational test. facial sensation and strength were normal. hearing was intact to finger rubbing bilaterally. Uvula tongue midline. head turning and shoulder shrug and were normal and symmetric.Tongue protrusion into cheek strength was normal. Neck flexion and extension normal. MOTOR: normal bulk and tone, full strength on the right, 4/5 on the left. Diminished grip on left. no pronator drift SENSORY: normal and symmetric to light touch, pinprick, temperature, vibration and proprioception COORDINATION: finger-nose-finger, heel-to-shin bilaterally, rapid alternating movement is normal on the right, slower on left REFLEXES: 1+ and symmetric GAIT/STATION: Rises from chair with use of hands. Stance is broad-based. Hemiparetic gait. ambulates with cane.   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 She had a questionable partial seizure at the time of the biopsy. She is on low-dose Keppra, 250 mg per day.  EEG 06/21/2008 revealed right central epileptiform discharges.  Continue Keppra. Follow risk = 20. Use cane at all times.  Followup in 1 year.   Hussien Greenblatt NP-C 10/19/2012, 4:54 PM  [facility] [address] [place], [place] [address] [phone]

## 2012-11-05 ENCOUNTER — Other Ambulatory Visit (HOSPITAL_COMMUNITY): Payer: Self-pay | Admitting: Internal Medicine

## 2012-11-05 DIAGNOSIS — R921 Mammographic calcification found on diagnostic imaging of breast: Secondary | ICD-10-CM

## 2012-11-17 ENCOUNTER — Other Ambulatory Visit (HOSPITAL_COMMUNITY): Payer: Self-pay | Admitting: Internal Medicine

## 2012-11-17 ENCOUNTER — Ambulatory Visit (HOSPITAL_COMMUNITY)
Admission: RE | Admit: 2012-11-17 | Discharge: 2012-11-17 | Disposition: A | Discharge status: Home / Self Care | Payer: Medicare Other | Source: Ambulatory Visit | Attending: Internal Medicine | Admitting: Internal Medicine

## 2012-11-17 DIAGNOSIS — N63 Unspecified lump in unspecified breast: Secondary | ICD-10-CM | POA: Insufficient documentation

## 2012-11-19 ENCOUNTER — Other Ambulatory Visit: Payer: Self-pay | Admitting: Neurology

## 2012-11-20 ENCOUNTER — Other Ambulatory Visit: Payer: Self-pay | Admitting: *Deleted

## 2012-11-20 MED ORDER — L-METHYLFOLATE-B6-B12 3-35-2 MG PO TABS: 1.0000 | ORAL_TABLET | Freq: Every day | ORAL | Status: DC

## 2012-11-21 NOTE — Telephone Encounter (Signed)
Let primary  MD address this

## 2012-11-23 ENCOUNTER — Other Ambulatory Visit: Payer: Self-pay

## 2012-11-23 MED ORDER — L-METHYLFOLATE-B6-B12 3-35-2 MG PO TABS: 1.0000 | ORAL_TABLET | Freq: Every day | ORAL | Status: DC

## 2012-11-27 ENCOUNTER — Encounter (HOSPITAL_COMMUNITY): Payer: Self-pay | Admitting: Emergency Medicine

## 2012-11-27 ENCOUNTER — Emergency Department (HOSPITAL_COMMUNITY): Payer: Medicare Other

## 2012-11-27 ENCOUNTER — Inpatient Hospital Stay (HOSPITAL_COMMUNITY)
Admission: EM | Admit: 2012-11-27 | Discharge: 2012-11-29 | DRG: 690 | Disposition: A | Discharge status: Home / Self Care | Payer: Medicare Other | Attending: Family Medicine | Admitting: Family Medicine

## 2012-11-27 DIAGNOSIS — I2510 Atherosclerotic heart disease of native coronary artery without angina pectoris: Secondary | ICD-10-CM | POA: Diagnosis present

## 2012-11-27 DIAGNOSIS — E860 Dehydration: Secondary | ICD-10-CM | POA: Diagnosis present

## 2012-11-27 DIAGNOSIS — E782 Mixed hyperlipidemia: Secondary | ICD-10-CM | POA: Diagnosis present

## 2012-11-27 DIAGNOSIS — G8929 Other chronic pain: Secondary | ICD-10-CM | POA: Diagnosis present

## 2012-11-27 DIAGNOSIS — R5381 Other malaise: Secondary | ICD-10-CM

## 2012-11-27 DIAGNOSIS — Z66 Do not resuscitate: Secondary | ICD-10-CM | POA: Diagnosis present

## 2012-11-27 DIAGNOSIS — Z85528 Personal history of other malignant neoplasm of kidney: Secondary | ICD-10-CM

## 2012-11-27 DIAGNOSIS — R748 Abnormal levels of other serum enzymes: Secondary | ICD-10-CM | POA: Diagnosis present

## 2012-11-27 DIAGNOSIS — E669 Obesity, unspecified: Secondary | ICD-10-CM | POA: Diagnosis present

## 2012-11-27 DIAGNOSIS — I129 Hypertensive chronic kidney disease with stage 1 through stage 4 chronic kidney disease, or unspecified chronic kidney disease: Secondary | ICD-10-CM | POA: Diagnosis present

## 2012-11-27 DIAGNOSIS — Z905 Acquired absence of kidney: Secondary | ICD-10-CM

## 2012-11-27 DIAGNOSIS — Z880 Allergy status to penicillin: Secondary | ICD-10-CM

## 2012-11-27 DIAGNOSIS — N184 Chronic kidney disease, stage 4 (severe): Secondary | ICD-10-CM | POA: Diagnosis present

## 2012-11-27 DIAGNOSIS — R531 Weakness: Secondary | ICD-10-CM | POA: Diagnosis present

## 2012-11-27 DIAGNOSIS — R109 Unspecified abdominal pain: Secondary | ICD-10-CM

## 2012-11-27 DIAGNOSIS — N390 Urinary tract infection, site not specified: Principal | ICD-10-CM | POA: Diagnosis present

## 2012-11-27 DIAGNOSIS — Z951 Presence of aortocoronary bypass graft: Secondary | ICD-10-CM

## 2012-11-27 LAB — CBC WITH DIFFERENTIAL/PLATELET
Basophils Relative: 1 % (ref 0–1)
HCT: 38.9 % (ref 36.0–46.0)
Hemoglobin: 12.8 g/dL (ref 12.0–15.0)
Lymphocytes Relative: 33 % (ref 12–46)
Lymphs Abs: 2.6 10*3/uL (ref 0.7–4.0)
Monocytes Relative: 10 % (ref 3–12)
Neutro Abs: 4.1 10*3/uL (ref 1.7–7.7)
Neutrophils Relative %: 52 % (ref 43–77)
RBC: 4.31 MIL/uL (ref 3.87–5.11)
WBC: 7.9 10*3/uL (ref 4.0–10.5)

## 2012-11-27 LAB — LIPASE, BLOOD: Lipase: 77 U/L — ABNORMAL HIGH (ref 11–59)

## 2012-11-27 LAB — URINALYSIS, ROUTINE W REFLEX MICROSCOPIC
Glucose, UA: NEGATIVE mg/dL
Nitrite: POSITIVE — AB
Specific Gravity, Urine: 1.025 (ref 1.005–1.030)
pH: 6 (ref 5.0–8.0)

## 2012-11-27 LAB — COMPREHENSIVE METABOLIC PANEL
Albumin: 3.3 g/dL — ABNORMAL LOW (ref 3.5–5.2)
Alkaline Phosphatase: 73 U/L (ref 39–117)
BUN: 30 mg/dL — ABNORMAL HIGH (ref 6–23)
CO2: 24 mEq/L (ref 19–32)
Chloride: 102 mEq/L (ref 96–112)
GFR calc non Af Amer: 22 mL/min — ABNORMAL LOW (ref >90)
Glucose, Bld: 99 mg/dL (ref 70–99)
Potassium: 3.5 mEq/L (ref 3.5–5.1)
Total Bilirubin: 0.4 mg/dL (ref 0.3–1.2)

## 2012-11-27 LAB — URINE MICROSCOPIC-ADD ON

## 2012-11-27 MED ORDER — TEMAZEPAM 15 MG PO CAPS: 15.0000 mg | ORAL_CAPSULE | Freq: Every evening | ORAL | Status: DC | PRN

## 2012-11-27 MED ORDER — DOCUSATE SODIUM 100 MG PO CAPS
100.0000 mg | ORAL_CAPSULE | Freq: Two times a day (BID) | ORAL | Status: DC
  Administered 2012-11-27 – 2012-11-29 (×5): 100 mg via ORAL
  Filled 2012-11-27 (×5): qty 1

## 2012-11-27 MED ORDER — DEXTROSE 5 % IV SOLN
1.0000 g | Freq: Once | INTRAVENOUS | Status: AC
  Administered 2012-11-27: 1 g via INTRAVENOUS
  Filled 2012-11-27: qty 10

## 2012-11-27 MED ORDER — FENTANYL 12 MCG/HR TD PT72
12.5000 ug | MEDICATED_PATCH | TRANSDERMAL | Status: DC
  Administered 2012-11-29: 12.5 ug via TRANSDERMAL
  Filled 2012-11-27: qty 1

## 2012-11-27 MED ORDER — SODIUM CHLORIDE 0.9 % IV BOLUS (SEPSIS)
1000.0000 mL | Freq: Once | INTRAVENOUS | Status: AC
  Administered 2012-11-27: 1000 mL via INTRAVENOUS

## 2012-11-27 MED ORDER — ONDANSETRON HCL 4 MG/2ML IJ SOLN: 4.0000 mg | Freq: Four times a day (QID) | INTRAMUSCULAR | Status: DC | PRN

## 2012-11-27 MED ORDER — IOHEXOL 300 MG/ML  SOLN: 50.0000 mL | Freq: Once | INTRAMUSCULAR | Status: DC | PRN

## 2012-11-27 MED ORDER — ENOXAPARIN SODIUM 40 MG/0.4ML ~~LOC~~ SOLN: 40.0000 mg | SUBCUTANEOUS | Status: DC

## 2012-11-27 MED ORDER — SODIUM CHLORIDE 0.9 % IV SOLN
INTRAVENOUS | Status: DC
  Administered 2012-11-27: 12:00:00 via INTRAVENOUS

## 2012-11-27 MED ORDER — MORPHINE SULFATE 2 MG/ML IJ SOLN
2.0000 mg | INTRAMUSCULAR | Status: DC | PRN
  Administered 2012-11-27 – 2012-11-28 (×4): 2 mg via INTRAVENOUS
  Filled 2012-11-27 (×4): qty 1

## 2012-11-27 MED ORDER — SODIUM CHLORIDE 0.9 % IV SOLN
INTRAVENOUS | Status: AC
  Administered 2012-11-28: 06:00:00 via INTRAVENOUS

## 2012-11-27 MED ORDER — HYDROMORPHONE HCL PF 1 MG/ML IJ SOLN
1.0000 mg | INTRAMUSCULAR | Status: DC | PRN
  Filled 2012-11-27: qty 1

## 2012-11-27 MED ORDER — LEVETIRACETAM 500 MG PO TABS
250.0000 mg | ORAL_TABLET | Freq: Every day | ORAL | Status: DC
  Administered 2012-11-27 – 2012-11-28 (×2): 250 mg via ORAL
  Filled 2012-11-27 (×2): qty 1

## 2012-11-27 MED ORDER — ONDANSETRON HCL 4 MG/2ML IJ SOLN: 4.0000 mg | Freq: Three times a day (TID) | INTRAMUSCULAR | Status: DC | PRN

## 2012-11-27 MED ORDER — ONDANSETRON HCL 4 MG PO TABS
4.0000 mg | ORAL_TABLET | Freq: Four times a day (QID) | ORAL | Status: DC | PRN
  Administered 2012-11-27: 4 mg via ORAL
  Filled 2012-11-27: qty 1

## 2012-11-27 MED ORDER — AMLODIPINE BESYLATE 5 MG PO TABS
5.0000 mg | ORAL_TABLET | Freq: Every day | ORAL | Status: DC
  Administered 2012-11-27 – 2012-11-28 (×2): 5 mg via ORAL
  Filled 2012-11-27 (×2): qty 1

## 2012-11-27 MED ORDER — IOHEXOL 300 MG/ML  SOLN
50.0000 mL | Freq: Once | INTRAMUSCULAR | Status: AC | PRN
  Administered 2012-11-27: 50 mL via ORAL

## 2012-11-27 MED ORDER — HYDROMORPHONE HCL PF 1 MG/ML IJ SOLN
1.0000 mg | Freq: Once | INTRAMUSCULAR | Status: AC
  Administered 2012-11-27: 1 mg via INTRAVENOUS

## 2012-11-27 MED ORDER — HYDROMORPHONE HCL PF 1 MG/ML IJ SOLN
1.0000 mg | Freq: Once | INTRAMUSCULAR | Status: AC
  Administered 2012-11-27: 1 mg via INTRAVENOUS
  Filled 2012-11-27: qty 1

## 2012-11-27 MED ORDER — HYDROCODONE-ACETAMINOPHEN 5-325 MG PO TABS
2.0000 | ORAL_TABLET | ORAL | Status: DC | PRN
  Administered 2012-11-27 – 2012-11-29 (×9): 2 via ORAL
  Filled 2012-11-27 (×9): qty 2

## 2012-11-27 MED ORDER — ONDANSETRON HCL 4 MG/2ML IJ SOLN
4.0000 mg | Freq: Once | INTRAMUSCULAR | Status: AC
  Administered 2012-11-27: 4 mg via INTRAVENOUS
  Filled 2012-11-27: qty 2

## 2012-11-27 MED ORDER — NITROGLYCERIN 0.4 MG SL SUBL: 0.4000 mg | SUBLINGUAL_TABLET | SUBLINGUAL | Status: DC | PRN

## 2012-11-27 MED ORDER — ACETAMINOPHEN 325 MG PO TABS: 650.0000 mg | ORAL_TABLET | Freq: Four times a day (QID) | ORAL | Status: DC | PRN

## 2012-11-27 MED ORDER — ASPIRIN EC 81 MG PO TBEC
81.0000 mg | DELAYED_RELEASE_TABLET | Freq: Every day | ORAL | Status: DC
  Administered 2012-11-27 – 2012-11-28 (×2): 81 mg via ORAL
  Filled 2012-11-27 (×2): qty 1

## 2012-11-27 MED ORDER — ACETAMINOPHEN 650 MG RE SUPP: 650.0000 mg | Freq: Four times a day (QID) | RECTAL | Status: DC | PRN

## 2012-11-27 MED ORDER — HYDROMORPHONE HCL PF 1 MG/ML IJ SOLN
INTRAMUSCULAR | Status: AC
  Administered 2012-11-27: 1 mg via INTRAVENOUS
  Filled 2012-11-27: qty 1

## 2012-11-27 MED ORDER — DEXTROSE 5 % IV SOLN
1.0000 g | INTRAVENOUS | Status: DC
  Administered 2012-11-28 – 2012-11-29 (×2): 1 g via INTRAVENOUS
  Filled 2012-11-27 (×3): qty 10

## 2012-11-27 MED ORDER — ENOXAPARIN SODIUM 30 MG/0.3ML ~~LOC~~ SOLN
30.0000 mg | SUBCUTANEOUS | Status: DC
  Administered 2012-11-27 – 2012-11-28 (×2): 30 mg via SUBCUTANEOUS
  Filled 2012-11-27 (×2): qty 0.3

## 2012-11-27 MED ORDER — ATORVASTATIN CALCIUM 40 MG PO TABS
40.0000 mg | ORAL_TABLET | Freq: Every day | ORAL | Status: DC
  Administered 2012-11-27 – 2012-11-28 (×2): 40 mg via ORAL
  Filled 2012-11-27 (×2): qty 1

## 2012-11-27 MED ORDER — ALPRAZOLAM 0.25 MG PO TABS
0.1250 mg | ORAL_TABLET | Freq: Every day | ORAL | Status: DC | PRN
  Administered 2012-11-27 – 2012-11-28 (×2): 0.25 mg via ORAL
  Filled 2012-11-27 (×2): qty 1

## 2012-11-27 NOTE — ED Notes (Signed)
Patient requesting pain medication. Rates pain 8/10 on NPS. MD notified. Verbal order for 1 mg Hydromorphone IV once obtained.

## 2012-11-27 NOTE — ED Notes (Signed)
Attempted to call report. RN to call back. 

## 2012-11-27 NOTE — ED Provider Notes (Signed)
CSN: [id number]     Arrival date & time 11/27/12  0630 History     First MD Initiated Contact with Patient 11/27/12 [phone]     Chief Complaint  Patient presents with  . Flank Pain   (Consider location/radiation/quality/duration/timing/severity/associated sxs/prior Treatment) HPI 79 y.o. Female s/p right nephrectomy for cancer (2002) presents wit right sided abdominal pain began in early am.  She describes as sharp and on right side of abdomen.  Nausea but no vomiting.  Subjective fever, no chills or diarrhea.  She states she has had pain with urination.  She saw her urologist last week and states she had a one time dose of cipro but did not get a prescription.  SHe says she had some nausea last night but has otherwise been well doing her normal daily activities which include housekeeping and meal preparation.  Her primary doctor is , Film/video editor is [place].   Past Medical History  Diagnosis Date  . Mixed hyperlipidemia   . 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 Essential hypertension, benign   . Postsurgical aortocoronary bypass status   . Bladder cancer   . Dizziness   . Obesity   . Back pain   . CAD (coronary artery disease)   . Seizure    Past Surgical History  Procedure Laterality Date  . Cataract extraction    . Coronary artery bypass graft    . Iridectomy    . Nephrectomy  2000   Family History  Problem Relation Age of Onset  . Stroke    . Coronary artery disease    . Cancer    . Diabetes    . Colon cancer Mother   . Heart attack Father   . Diabetes Father    History  Substance Use Topics  . Smoking status: Never Smoker   . Smokeless tobacco: Never Used  . Alcohol Use: No   OB History   Grav Para Term Preterm Abortions TAB SAB Ect Mult Living                 Review of Systems  All other systems reviewed and are negative.    Allergies  Adhesive; Latex; Penicillins; and Sulfa antibiotics  Home Medications   Current Outpatient Rx  Name  Route  Sig  Dispense   Refill  . ALPRAZolam (XANAX) 0.5 MG tablet   Oral   Take 0.125-0.5 mg by mouth daily as needed. For nerves         . amLODipine (NORVASC) 5 MG tablet   Oral   Take 5 mg by mouth daily.         Marland Kitchen aspirin EC 81 MG tablet   Oral   Take 81 mg by mouth at bedtime.          . CRESTOR 20 MG tablet   Oral   Take 1 tablet by mouth at bedtime.          . docusate sodium (COLACE) 50 MG capsule   Oral   Take 100 mg by mouth 2 (two) times daily.         . fentaNYL (DURAGESIC - DOSED MCG/HR) 12 MCG/HR   Transdermal   Place 1-2 patches onto the skin every 3 (three) days.         Marland Kitchen HYDROcodone-acetaminophen (LORTAB) 10-500 MG per tablet   Oral   Take 1 tablet by mouth every 6 (six) hours as needed.         Marland Kitchen l-methylfolate-B6-B12 (METANX) 3-35-2  MG TABS   Oral   Take 1 tablet by mouth at bedtime.   90 tablet   1   . levETIRAcetam (KEPPRA) 250 MG tablet   Oral   Take 1 tablet (250 mg total) by mouth at bedtime.   90 tablet   3   . Multiple Vitamins-Minerals (ICAPS PO)   Oral   Take 1 tablet by mouth at bedtime.         . temazepam (RESTORIL) 15 MG capsule   Oral   Take 15 mg by mouth at bedtime as needed. For sleep         . VOLTAREN 1 % GEL   Topical   Apply 2 g topically daily as needed (Muscle Pain).          . nitroGLYCERIN (NITROSTAT) 0.4 MG SL tablet   Sublingual   Place 0.4 mg under the tongue every 5 (five) minutes as needed. For chest pain          BP 157/64  Pulse 66  Temp(Src) 98.9 F (37.2 C) (Oral)  Resp 17  Ht 5\' 2"  (1.575 m)  Wt 175 lb (79.379 kg)  BMI 32 kg/m2  SpO2 97% Physical Exam  Nursing note and vitals reviewed. Constitutional: She is oriented to person, place, and time. She appears well-developed and well-nourished.  HENT:  Head: Normocephalic and atraumatic.  Right Ear: External ear normal.  Left Ear: External ear normal.  Nose: Nose normal.  Mouth/Throat: Oropharynx is clear and moist.  Eyes: Conjunctivae and  EOM are normal. Pupils are equal, round, and reactive to light.  Neck: Normal range of motion. Neck supple.  Cardiovascular: Normal rate, regular rhythm, normal heart sounds and intact distal pulses.   Pulmonary/Chest: Effort normal and breath sounds normal.  Abdominal: Soft. She exhibits no mass. There is tenderness. There is no rebound and no guarding.  Diffuse right sided ttp  Musculoskeletal: Normal range of motion.  Neurological: She is alert and oriented to person, place, and time.  Skin: Skin is warm and dry.  Psychiatric: She has a normal mood and affect. Her behavior is normal. Judgment and thought content normal.    ED Course   Procedures (including critical care time)  Labs Reviewed  URINALYSIS, ROUTINE W REFLEX MICROSCOPIC - Abnormal; Notable for the following:    APPearance HAZY (*)    Hgb urine dipstick MODERATE (*)    Protein, ur 30 (*)    Nitrite POSITIVE (*)    Leukocytes, UA MODERATE (*)    All other components within normal limits  COMPREHENSIVE METABOLIC PANEL - Abnormal; Notable for the following:    BUN 30 (*)    Creatinine, Ser 2.03 (*)    Albumin 3.3 (*)    GFR calc non Af Amer 22 (*)    GFR calc Af Amer 25 (*)    All other components within normal limits  LIPASE, BLOOD - Abnormal; Notable for the following:    Lipase 77 (*)    All other components within normal limits  URINE MICROSCOPIC-ADD ON - Abnormal; Notable for the following:    Squamous Epithelial / LPF FEW (*)    Bacteria, UA MANY (*)    All other components within normal limits  URINE CULTURE  CBC WITH DIFFERENTIAL   Ct Abdomen Pelvis Wo Contrast  11/27/2012   *RADIOLOGY REPORT*  Clinical Data: Right lower quadrant pain this morning, history renal cancer post right nephrectomy, hyperlipidemia, hypertension, bladder cancer  CT ABDOMEN AND PELVIS WITHOUT  CONTRAST  Technique:  Multidetector CT imaging of the abdomen and pelvis was performed following the standard protocol without intravenous  contrast. Sagittal and coronal MPR images reconstructed from axial data set.  Comparison: 07/24/2010  Findings: Calcified granulomas of lung bases. Minimal bibasilar atelectasis versus scarring. Scattered atherosclerotic calcifications aorta, iliac arteries, coronary arteries and origin of a common celiac/SMA trunk. Post right nephrectomy. Mild prominence of the left ureter to the bladder without obstructing calculus. Deformity of the bladder base with multiple small diverticula.  Within limits of a nonenhanced exam no focal abnormalities of the liver, spleen, pancreas, or adrenal glands. Stomach and bowel loops normal appearance. No mass, adenopathy, free fluid inflammatory process. Bones demineralized with multiple old pelvic fractures. Prior vertebral compression fracture and augmentation procedure L1. Dextroconvex thoracolumbar scoliosis.  IMPRESSION: Old granulomatous disease at lung bases. Post right nephrectomy. Mild dilatation of the left ureter extending to the urinary bladder where bladder deformity and multiple diverticula are identified, question related to prior bladder resection. However, no gross hydronephrosis is identified.   Original Report Authenticated By: Lavonia Dana, M.D.   US Abdomen Complete  11/27/2012   *RADIOLOGY REPORT*  Clinical Data:  Abdominal pain.  History of a right nephrectomy and bladder carcinoma.  COMPLETE ABDOMINAL ULTRASOUND  Comparison:  Ultrasound, 05/01/2011.  CT, 07/24/2010.  Findings:  Gallbladder:  No gallstones, gallbladder wall thickening, or pericholecystic fluid.  Common bile duct:  Mildly prominent, but less dilated than noted on the prior ultrasound.  The duct measures 5.4 mm.  No duct stone is seen.  Liver:  Liver is normal in overall size.  There is increased parenchymal echogenicity consistent with fatty infiltration.  No liver mass or focal lesion is seen.  IVC:  Appears normal.  Pancreas:  Echogenic.  No pancreatic masses seen.  The duct is mildly dilated at  4 mm.  It is more dilated than on the prior ultrasound.  Spleen:  Normal measuring 8.9 cm.  Right Kidney:  Right kidney has been removed.  Left Kidney:  Left kidney is echogenic.  There is a 1 cm cyst of the mid pole, stable.  No other renal masses, no stones and no hydronephrosis.  Abdominal aorta:  Atherosclerotic plaque.  No aneurysm.  IMPRESSION: No acute findings.  Hepatic steatosis.  Mild dilation of the pancreatic duct of unclear etiology, mildly increased from the prior study.  No pancreatic mass is seen.  Status post right nephrectomy.  Increased renal parenchymal echogenicity of the left is consistent with medical renal disease.  Small stable left renal cyst.   Original Report Authenticated By: Lajean Manes, M.D.   No diagnosis found.  Results for orders placed during the hospital encounter of 11/27/12  URINALYSIS, ROUTINE W REFLEX MICROSCOPIC      Result Value Range   Color, Urine YELLOW  YELLOW   APPearance HAZY (*) CLEAR   Specific Gravity, Urine 1.025  1.005 - 1.030   pH 6.0  5.0 - 8.0   Glucose, UA NEGATIVE  NEGATIVE mg/dL   Hgb urine dipstick MODERATE (*) NEGATIVE   Bilirubin Urine NEGATIVE  NEGATIVE   Ketones, ur NEGATIVE  NEGATIVE mg/dL   Protein, ur 30 (*) NEGATIVE mg/dL   Urobilinogen, UA 0.2  0.0 - 1.0 mg/dL   Nitrite POSITIVE (*) NEGATIVE   Leukocytes, UA MODERATE (*) NEGATIVE  CBC WITH DIFFERENTIAL      Result Value Range   WBC 7.9  4.0 - 10.5 K/uL   RBC 4.31  3.87 - 5.11 MIL/uL  Hemoglobin 12.8  12.0 - 15.0 g/dL   HCT 38.9  36.0 - 46.0 %   MCV 90.3  78.0 - 100.0 fL   MCH 29.7  26.0 - 34.0 pg   MCHC 32.9  30.0 - 36.0 g/dL   RDW 14.8  11.5 - 15.5 %   Platelets 200  150 - 400 K/uL   Neutrophils Relative % 52  43 - 77 %   Neutro Abs 4.1  1.7 - 7.7 K/uL   Lymphocytes Relative 33  12 - 46 %   Lymphs Abs 2.6  0.7 - 4.0 K/uL   Monocytes Relative 10  3 - 12 %   Monocytes Absolute 0.8  0.1 - 1.0 K/uL   Eosinophils Relative 4  0 - 5 %   Eosinophils Absolute 0.3  0.0  - 0.7 K/uL   Basophils Relative 1  0 - 1 %   Basophils Absolute 0.0  0.0 - 0.1 K/uL  COMPREHENSIVE METABOLIC PANEL      Result Value Range   Sodium 137  135 - 145 mEq/L   Potassium 3.5  3.5 - 5.1 mEq/L   Chloride 102  96 - 112 mEq/L   CO2 24  19 - 32 mEq/L   Glucose, Bld 99  70 - 99 mg/dL   BUN 30 (*) 6 - 23 mg/dL   Creatinine, Ser 2.03 (*) 0.50 - 1.10 mg/dL   Calcium 9.4  8.4 - 10.5 mg/dL   Total Protein 7.3  6.0 - 8.3 g/dL   Albumin 3.3 (*) 3.5 - 5.2 g/dL   AST 24  0 - 37 U/L   ALT 19  0 - 35 U/L   Alkaline Phosphatase 73  39 - 117 U/L   Total Bilirubin 0.4  0.3 - 1.2 mg/dL   GFR calc non Af Amer 22 (*) >90 mL/min   GFR calc Af Amer 25 (*) >90 mL/min  LIPASE, BLOOD      Result Value Range   Lipase 77 (*) 11 - 59 U/L  URINE MICROSCOPIC-ADD ON      Result Value Range   Squamous Epithelial / LPF FEW (*) RARE   WBC, UA TOO NUMEROUS TO COUNT  <3 WBC/hpf   RBC / HPF 7-10  <3 RBC/hpf   Bacteria, UA MANY (*) RARE   79 y.o. Female with multiple health problems most significantly in this setting s/p right nephrectomy presents today with right sided abdominal pain and uti which has been partially treated as outpatient.  Her creatinine is elevated at 2.03.  She is being hydrated here and has received iv rocephin.  Urine is being cultured.   Discussed with DR. Sarajane Jews and plan observation for iv fluids, antibiotics and pain control.  Discussed with patient and son and they voice understanding.   Shaune Pollack, MD 11/27/12 [phone]

## 2012-11-27 NOTE — ED Notes (Signed)
Patient complains of bilateral flank pain. Reports history of right nephrectomy. States pain started suddenly this morning. Also reports nausea.

## 2012-11-27 NOTE — ED Notes (Signed)
Ambulatory to restroom with assist x 1. Used cane as assistive device.

## 2012-11-27 NOTE — ED Notes (Signed)
MD at bedside. 

## 2012-11-27 NOTE — Progress Notes (Signed)
Pt came up from ED. Pt in NAD, will continue to monitor.

## 2012-11-27 NOTE — H&P (Signed)
History and Physical  [name] [name] [id number] DOB: December 05, 1928 DOA: 11/27/2012  Referring physician: Dr. Hart Robinsons PCP: Delphina Cahill, MD   Chief Complaint: Abdominal pain  HPI:  79 year old woman presented to the emergency department with lower abdominal pain and generalized weakness. Initial evaluation was notable for UTI and possible dehydration.  History obtained from patient at bedside. She has a history of right-sided nephrectomy and has chronic right flank pain that radiates around to her right lower quadrant. This is long-standing in nature and has not recently changed.  She was recently seen by her urologist Dr. Jeffie Pollock and given one dose of ciprofloxacin, antibiotics were not continued for unclear reasons. For the last week or more she has had significant suprapubic pain and severe dysuria. This morning approximately every morning the suprapubic pain became much worse and she had generalized weakness and had difficulty standing. No focal deficit.  In the emergency department she was noted to be afebrile with stable vital signs. BUN and creatinine were near baseline consistent with chronic kidney disease stage IV. CBC was normal. Urinalysis was grossly positive, urine culture sent. CT of the abdomen and pelvis was negative for acute abnormalities. Abdominal ultrasound was negative for acute abnormalities. Some dilatation of the pancreatic duct was seen. She was treated with hydromorphone, Zofran, IV fluids, ceftriaxone.  Review of Systems:  Negative for fever, visual changes, sore throat, rash, new muscle aches, chest pain, SOB, dysuria, bleeding, n/v  Past Medical History  Diagnosis Date  . Mixed hyperlipidemia   . 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 Essential hypertension, benign   . Postsurgical aortocoronary bypass status   . Bladder cancer   . Dizziness   . Obesity   . Back pain   . CAD (coronary artery disease)   . Seizure     Past Surgical History  Procedure Laterality Date  .  Cataract extraction    . Coronary artery bypass graft    . Iridectomy    . Nephrectomy  2000    Social History:  reports that she has never smoked. She has never used smokeless tobacco. She reports that she does not drink alcohol or use illicit drugs.  Allergies  Allergen Reactions  . Adhesive [Tape] Other (See Comments)    Tears patients skin off.   . Latex Itching  . Penicillins Hives and Itching  . Sulfa Antibiotics Hives and Itching    Family History  Problem Relation Age of Onset  . Stroke    . Coronary artery disease    . Cancer    . Diabetes    . Colon cancer Mother   . Heart attack Father   . Diabetes Father      Prior to Admission medications   Medication Sig Start Date End Date Taking? Authorizing Provider  ALPRAZolam Duanne Moron) 0.5 MG tablet Take 0.125-0.5 mg by mouth daily as needed. For nerves   Yes Historical Provider, MD  amLODipine (NORVASC) 5 MG tablet Take 5 mg by mouth daily.   Yes Historical Provider, MD  aspirin EC 81 MG tablet Take 81 mg by mouth at bedtime.    Yes Historical Provider, MD  CRESTOR 20 MG tablet Take 1 tablet by mouth at bedtime.  01/06/11  Yes Historical Provider, MD  docusate sodium (COLACE) 50 MG capsule Take 100 mg by mouth 2 (two) times daily.   Yes Historical Provider, MD  fentaNYL (DURAGESIC - DOSED MCG/HR) 12 MCG/HR Place 1-2 patches onto the skin every 3 (three) days.  Yes Historical Provider, MD  HYDROcodone-acetaminophen (LORTAB) 10-500 MG per tablet Take 1 tablet by mouth every 6 (six) hours as needed.   Yes Historical Provider, MD  l-methylfolate-B6-B12 (METANX) 3-35-2 MG TABS Take 1 tablet by mouth at bedtime. 11/23/12  Yes Garvin Fila, MD  levETIRAcetam (KEPPRA) 250 MG tablet Take 1 tablet (250 mg total) by mouth at bedtime. 10/19/12  Yes Philmore Pali, NP  Multiple Vitamins-Minerals (ICAPS PO) Take 1 tablet by mouth at bedtime.   Yes Historical Provider, MD  temazepam (RESTORIL) 15 MG capsule Take 15 mg by mouth at bedtime as  needed. For sleep   Yes Historical Provider, MD  VOLTAREN 1 % GEL Apply 2 g topically daily as needed (Muscle Pain).  08/20/12  Yes Historical Provider, MD  nitroGLYCERIN (NITROSTAT) 0.4 MG SL tablet Place 0.4 mg under the tongue every 5 (five) minutes as needed. For chest pain 01/14/11 11/27/12  Josue Hector, MD   Physical Exam: Filed Vitals:   11/27/12 0649 11/27/12 0832 11/27/12 1013 11/27/12 1141  BP: 152/65 157/64 151/56 121/64  Pulse: 70 66 58 60  Temp: 98.9 F (37.2 C)   98.1 F (36.7 C)  TempSrc: Oral   Oral  Resp: 16 17 18 18   Height: 5\' 2"  (1.575 m)   5\' 2"  (1.575 m)  Weight: 79.379 kg (175 lb)   83.779 kg (184 lb 11.2 oz)  SpO2: 95% 97% 98% 95%    General: Examined in the emergency department. Appears calm and comfortable Eyes: PERRL, normal lids, irises  ENT: grossly normal hearing, lips  Neck: no LAD, masses or thyromegaly Cardiovascular: RRR, no m/r/g. No LE edema. Respiratory: CTA bilaterally, no w/r/r. Normal respiratory effort. Abdomen: Obese. Moderate suprapubic pain. Known right lower other pain. No epigastric pain. Benign examination. Skin: no rash or induration seen  Musculoskeletal: grossly normal tone BUE/BLE Psychiatric: grossly normal mood and affect, speech fluent and appropriate Neurologic: grossly non-focal.  Wt Readings from Last 3 Encounters:  11/27/12 83.779 kg (184 lb 11.2 oz)  10/19/12 83.008 kg (183 lb)  11/06/11 82.555 kg (182 lb)    Labs on Admission:  Basic Metabolic Panel:  Recent Labs Lab 11/27/12 0729  NA 137  K 3.5  CL 102  CO2 24  GLUCOSE 99  BUN 30*  CREATININE 2.03*  CALCIUM 9.4    Liver Function Tests:  Recent Labs Lab 11/27/12 0729  AST 24  ALT 19  ALKPHOS 73  BILITOT 0.4  PROT 7.3  ALBUMIN 3.3*    Recent Labs Lab 11/27/12 0729  LIPASE 77*    CBC:  Recent Labs Lab 11/27/12 0729  WBC 7.9  NEUTROABS 4.1  HGB 12.8  HCT 38.9  MCV 90.3  PLT 200    Radiological Exams on Admission: Ct Abdomen  Pelvis Wo Contrast  11/27/2012   *RADIOLOGY REPORT*  Clinical Data: Right lower quadrant pain this morning, history renal cancer post right nephrectomy, hyperlipidemia, hypertension, bladder cancer  CT ABDOMEN AND PELVIS WITHOUT CONTRAST  Technique:  Multidetector CT imaging of the abdomen and pelvis was performed following the standard protocol without intravenous contrast. Sagittal and coronal MPR images reconstructed from axial data set.  Comparison: 07/24/2010  Findings: Calcified granulomas of lung bases. Minimal bibasilar atelectasis versus scarring. Scattered atherosclerotic calcifications aorta, iliac arteries, coronary arteries and origin of a common celiac/SMA trunk. Post right nephrectomy. Mild prominence of the left ureter to the bladder without obstructing calculus. Deformity of the bladder base with multiple small diverticula.  Within  limits of a nonenhanced exam no focal abnormalities of the liver, spleen, pancreas, or adrenal glands. Stomach and bowel loops normal appearance. No mass, adenopathy, free fluid inflammatory process. Bones demineralized with multiple old pelvic fractures. Prior vertebral compression fracture and augmentation procedure L1. Dextroconvex thoracolumbar scoliosis.  IMPRESSION: Old granulomatous disease at lung bases. Post right nephrectomy. Mild dilatation of the left ureter extending to the urinary bladder where bladder deformity and multiple diverticula are identified, question related to prior bladder resection. However, no gross hydronephrosis is identified.   Original Report Authenticated By: Lavonia Dana, M.D.   US Abdomen Complete  11/27/2012   *RADIOLOGY REPORT*  Clinical Data:  Abdominal pain.  History of a right nephrectomy and bladder carcinoma.  COMPLETE ABDOMINAL ULTRASOUND  Comparison:  Ultrasound, 05/01/2011.  CT, 07/24/2010.  Findings:  Gallbladder:  No gallstones, gallbladder wall thickening, or pericholecystic fluid.  Common bile duct:  Mildly prominent,  but less dilated than noted on the prior ultrasound.  The duct measures 5.4 mm.  No duct stone is seen.  Liver:  Liver is normal in overall size.  There is increased parenchymal echogenicity consistent with fatty infiltration.  No liver mass or focal lesion is seen.  IVC:  Appears normal.  Pancreas:  Echogenic.  No pancreatic masses seen.  The duct is mildly dilated at 4 mm.  It is more dilated than on the prior ultrasound.  Spleen:  Normal measuring 8.9 cm.  Right Kidney:  Right kidney has been removed.  Left Kidney:  Left kidney is echogenic.  There is a 1 cm cyst of the mid pole, stable.  No other renal masses, no stones and no hydronephrosis.  Abdominal aorta:  Atherosclerotic plaque.  No aneurysm.  IMPRESSION: No acute findings.  Hepatic steatosis.  Mild dilation of the pancreatic duct of unclear etiology, mildly increased from the prior study.  No pancreatic mass is seen.  Status post right nephrectomy.  Increased renal parenchymal echogenicity of the left is consistent with medical renal disease.  Small stable left renal cyst.   Original Report Authenticated By: Lajean Manes, M.D.    Principal Problem:   UTI (urinary tract infection) Active Problems:   Weakness generalized   CAD (coronary artery disease)   CKD (chronic kidney disease), stage IV   Assessment/Plan 1. Urinary tract infection: With associated suprapubic abdominal pain. Partially treated as an outpatient. Empiric Rocephin, penicillin allergy noted the patient tolerated dose in the emergency department. Followup urine culture. 2. Generalized weakness: Likely secondary to UTI, possible dehydration. Monitor clinically. 3. Mild elevation of lipase: Significance unclear. Patient has no nausea, vomiting or epigastric abdominal pain. Monitor clinically. Several previous episodes of elevated lipase. Mild dilatation of the pancreatic duct of unclear significance. Repeat lipase in AM. 4. Chronic kidney disease stage IV: Overall this appears  to be at baseline. Mild elevation of BUN, possible component of dehydration. Gentle IV fluids. 5. Chronic right flank pain: Radiating to the right lower quadrant. This is a chronic issue per patient without recent change. Imaging with ultrasound and CT negative for acute process. 6. Hypertension: Stable. Continue Norvasc. 7. History of seizure disorder: Stable. Continue Keppra. 8. Chronic pain: Continue Duragesic patch. 9. History of coronary artery disease: Appears stable.  Code Status: DNR  DVT prophylaxis: Lovenox Family Communication: Discussed with son at bedside Disposition Plan/Anticipated LOS: Observation. 1-2 days.  Time spent: 50 minutes  Murray Hodgkins, MD  [facility] Pager [phone] 11/27/2012, 12:01 PM

## 2012-11-27 NOTE — ED Notes (Signed)
Report given to Judson Roch, RN. Ready to receive patient.

## 2012-11-28 LAB — BASIC METABOLIC PANEL
CO2: 26 mEq/L (ref 19–32)
Calcium: 8.6 mg/dL (ref 8.4–10.5)
Creatinine, Ser: 2.05 mg/dL — ABNORMAL HIGH (ref 0.50–1.10)
Glucose, Bld: 103 mg/dL — ABNORMAL HIGH (ref 70–99)

## 2012-11-28 LAB — LIPASE, BLOOD: Lipase: 48 U/L (ref 11–59)

## 2012-11-28 NOTE — Progress Notes (Signed)
[facility] PROGRESS NOTE  [name] [name] [id number] DOB: 03-16-1929 DOA: 11/27/2012 PCP: Delphina Cahill, MD  Assessment/Plan: 1. Urinary tract infection: Continue empiric Rocephin. Followup urine culture. 2. Generalized weakness: Likely secondary to UTI, possible dehydration. Consider physical therapy consultation or home health PT if those home today. 3. Mild elevation of lipase: Resolved. No vomiting or epigastric abdominal pain. Several previous episodes of elevated lipase noted. Mild dilatation of pancreatic duct of unclear significance. No further evaluation suggested as an inpatient. 4. Chronic kidney disease stage IV: Appears to be at baseline. Suspected component of dehydration, improved BUN today. Continue IV fluids. 5. Chronic right flank pain: Chronic issue without recent change. Imaging with ultrasound CT negative for acute process. 6. Hypertension: Stable. Continue Norvasc. 7. History of seizure disorder: Continue Keppra. 8. Chronic pain: Continue fentanyl patch. 9. History of coronary artery disease: Appears stable.   Continue Rocephin. Followup urine culture. Continue IV fluids an additional 8 hours.  Recheck in this afternoon, patient interested in going home. Would anticipate discharge within the next 24 hours.  Pending studies:   Urine culture  Code Status: DNR DVT prophylaxis: Lovenox Family Communication: None present  Disposition Plan: As above  Murray Hodgkins, MD  [facility]  Pager [phone] If 7PM-7AM, please contact night-coverage at [email or web address], password [facility] 11/28/2012, 10:48 AM  LOS: 1 day   Clinical Summary: 79 year old woman presented to the emergency department with lower abdominal pain and generalized weakness. Initial evaluation was notable for UTI and possible dehydration.  Consultants:  None   Procedures:  none  Antibiotics:  Ceftriaxone 8/24 >>  HPI/Subjective: Still has suprapubic pain, some nausea. No vomiting.  Chronic right flank and right lower abdominal pain without change.  Objective: Filed Vitals:   11/27/12 1013 11/27/12 1141 11/27/12 2138 11/28/12 0515  BP: 151/56 121/64 127/70 145/66  Pulse: 58 60 66 64  Temp:  98.1 F (36.7 C) 98.4 F (36.9 C) 97.4 F (36.3 C)  TempSrc:  Oral Oral Oral  Resp: 18 18 18 18   Height:  5\' 2"  (1.575 m)    Weight:  83.779 kg (184 lb 11.2 oz)  83.6 kg (184 lb 4.9 oz)  SpO2: 98% 95% 94% 96%    Intake/Output Summary (Last 24 hours) at 11/28/12 1048 Last data filed at 11/27/12 1806  Gross per 24 hour  Intake    120 ml  Output      0 ml  Net    120 ml     Filed Weights   11/27/12 0649 11/27/12 1141 11/28/12 0515  Weight: 79.379 kg (175 lb) 83.779 kg (184 lb 11.2 oz) 83.6 kg (184 lb 4.9 oz)    Exam:   Afebrile. Vital signs stable.  Cardiovascular: Regular rate and rhythm. No murmur, rub, gallop.  Respiratory: Clear to auscultation bilaterally. No wheezes, rales, rhonchi. Normal respiratory effort.  Abdomen: Soft, some suprapubic and right lower quadrant pain. Benign examination. Nondistended.  General: Appears calm, mildly uncomfortable. Nontoxic.  Data Reviewed:  Basic metabolic panel without significant change. Some modest improvement in BUN. Creatinine appears stable.  Scheduled Meds: . amLODipine  5 mg Oral QHS  . aspirin EC  81 mg Oral QHS  . atorvastatin  40 mg Oral QHS  . cefTRIAXone (ROCEPHIN)  IV  1 g Intravenous Q24H  . docusate sodium  100 mg Oral BID  . enoxaparin (LOVENOX) injection  30 mg Subcutaneous Q24H  . fentaNYL  12.5 mcg Transdermal Q72H  . levETIRAcetam  250 mg Oral QHS  Continuous Infusions: . sodium chloride 50 mL/hr at 11/28/12 [id number]    Principal Problem:   UTI (urinary tract infection) Active Problems:   Weakness generalized   CAD (coronary artery disease)   CKD (chronic kidney disease), stage IV   Time spent 15 minutes

## 2012-11-28 NOTE — Progress Notes (Signed)
UTILIZATION REVIEW COMPLETED.

## 2012-11-28 NOTE — Progress Notes (Signed)
Pt's son is concerned about pt being able to go home. He feels that she is weak and is afraid she may fall at home as he works full time and is unable to be home enough to supervise her. He is questioning whether she could benefit from some home health for physical therapy. Reported this off to the oncoming nurse, and will leave a sticky note for the doctor and case manager to see in case they want to order a PT consult.

## 2012-11-29 LAB — URINE CULTURE

## 2012-11-29 MED ORDER — CEFUROXIME AXETIL 500 MG PO TABS: 500.0000 mg | ORAL_TABLET | Freq: Two times a day (BID) | ORAL | Status: DC

## 2012-11-29 NOTE — Progress Notes (Signed)
11/29/12 1335 Patient discharged home this afternoon. IV site d/c'd earlier, site within normal limits. Discharge instructions and prescription given and reviewed with patient per Stann Mainland, RN. Pt left floor in stable condition via w/c accompanied by nurse tech. Donavan Foil, RN

## 2012-11-29 NOTE — Progress Notes (Signed)
[facility] PROGRESS NOTE  [name] [name] [id number] DOB: 1928-06-17 DOA: 11/27/2012 PCP: Delphina Cahill, MD  Assessment/Plan: 1. Urinary tract infection: Appears clinically resolved. Change to oral antibiotics. Followup urine culture. 2. Generalized weakness: Likely secondary to UTI, possible dehydration. Appears resolved. Did very well physical therapy. 3. Mild elevation of lipase: Resolved. No vomiting or epigastric abdominal pain. Several previous episodes of elevated lipase noted. Mild dilatation of pancreatic duct of unclear significance. No further evaluation suggested as an inpatient. 4. Chronic kidney disease stage IV: Appears to be at baseline. Suspected component of dehydration. 5. Chronic right flank pain: Chronic issue without recent change. Imaging with ultrasound CT negative for acute process. 6. Hypertension: Stable. Continue Norvasc. 7. History of seizure disorder: Continue Keppra. 8. Chronic pain: Continue fentanyl patch. 9. History of coronary artery disease: Appears stable.   Change to oral antibiotics. Complete total seven-day course.  Followup urine culture.  Discharge home.  Pending studies:   Urine culture  Code Status: DNR DVT prophylaxis: Lovenox Family Communication: None present  Disposition Plan: As above  Murray Hodgkins, MD  [facility]  Pager [phone] If 7PM-7AM, please contact night-coverage at [email or web address], password [facility] 11/29/2012, 11:34 AM  LOS: 2 days   Clinical Summary: 79 year old woman presented to the emergency department with lower abdominal pain and generalized weakness. Initial evaluation was notable for UTI and possible dehydration.  Consultants:  None   Procedures:  none  Antibiotics:  Ceftriaxone 8/23 >> 8/25  Ceftin 8/26 >> 8/29  HPI/Subjective: Suprapubic pain has resolved. She feels well and like to go home. Did very well with physical therapy.  Objective: Filed Vitals:   11/28/12 0515 11/28/12 1329  11/28/12 2118 11/29/12 0434  BP: 145/66 130/60 116/67 118/61  Pulse: 64 65 64 54  Temp: 97.4 F (36.3 C) 99.4 F (37.4 C) 98.7 F (37.1 C) 98.9 F (37.2 C)  TempSrc: Oral Oral Oral Oral  Resp: 18 18 18 18   Height:      Weight: 83.6 kg (184 lb 4.9 oz)     SpO2: 96% 93% 95% 92%    Intake/Output Summary (Last 24 hours) at 11/29/12 1134 Last data filed at 11/29/12 0958  Gross per 24 hour  Intake   1170 ml  Output      0 ml  Net   1170 ml     Filed Weights   11/27/12 0649 11/27/12 1141 11/28/12 0515  Weight: 79.379 kg (175 lb) 83.779 kg (184 lb 11.2 oz) 83.6 kg (184 lb 4.9 oz)    Exam:   Afebrile, vital signs stable.  General: appears calm and comfortable. Sitting in chair. Appears better.  Cardiovascular: Regular rate and rhythm. No murmur, rub, gallop.  Respiratory: Clear to auscultation bilaterally. No rales, wheezes or rhonchi. Normal respiratory effort.  Abdomen: Soft, no lower abdominal pain.  Data Reviewed:  No new data  Scheduled Meds: . amLODipine  5 mg Oral QHS  . aspirin EC  81 mg Oral QHS  . atorvastatin  40 mg Oral QHS  . cefTRIAXone (ROCEPHIN)  IV  1 g Intravenous Q24H  . docusate sodium  100 mg Oral BID  . enoxaparin (LOVENOX) injection  30 mg Subcutaneous Q24H  . fentaNYL  12.5 mcg Transdermal Q72H  . levETIRAcetam  250 mg Oral QHS   Continuous Infusions:    Principal Problem:   UTI (urinary tract infection) Active Problems:   Weakness generalized   CAD (coronary artery disease)   CKD (chronic kidney disease), stage IV

## 2012-11-29 NOTE — Evaluation (Signed)
Physical Therapy Evaluation Patient Details Name: [name] [name] MRN: [id number] DOB: [birth year] Today's Date: 11/29/2012 Time: [id number] PT Time Calculation (min): 23 min  PT Assessment / Plan / Recommendation History of Present Illness  Pt is admitted with a UTI...had been developing generalized weakness PTA and son is concerned about pt stability at home.  Clinical Impression   Pt reports feeling very well and is amused that her son is so worried about her.  She states that she feels "back to normal".  On evaluation, I did not see any significant functional instability.  Her dynamic standing balance is certainly WNL and she demonstrates good judgement in using a cane when appropriate.  She had no difficulty rising from a chair which had apparently been a problem just PTA.  Pt does not wish to have any HHPT and actually, at this point, she would not qualify for it.    PT Assessment  Patent does not need any further PT services    Follow Up Recommendations  No PT follow up    Does the patient have the potential to tolerate intense rehabilitation      Barriers to Discharge        Equipment Recommendations  None recommended by PT    Recommendations for Other Services     Frequency      Precautions / Restrictions Precautions Precautions: None Restrictions Weight Bearing Restrictions: No   Pertinent Vitals/Pain       Mobility  Bed Mobility Bed Mobility: Not assessed Transfers Transfers: Sit to Stand;Stand to Sit Sit to Stand: 7: Independent;With upper extremity assist Stand to Sit: 7: Independent;With upper extremity assist Ambulation/Gait Ambulation/Gait Assistance: 7: Independent Ambulation Distance (Feet): 150 Feet Assistive device: Straight cane Gait Pattern: Within Functional Limits Gait velocity: WNL Stairs: No Wheelchair Mobility Wheelchair Mobility: No    Exercises     PT Diagnosis:    PT Problem List:   PT Treatment Interventions:       PT  Goals(Current goals can be found in the care plan section) Acute Rehab PT Goals PT Goal Formulation: No goals set, d/c therapy  Visit Information  Last PT Received On: 11/29/12 History of Present Illness: Pt is admitted with a UTI...had been developing generalized weakness PTA and son is concerned about pt stability at home.       Prior [place] expects to be discharged to:: Private residence Living Arrangements: Children Available Help at Discharge: Available PRN/intermittently;Family Type of Home: House Home Access: Stairs to enter [employer] of Steps: 2 Entrance Stairs-Rails: Right Home Layout: One level Home Equipment: Walker - 2 wheels;Cane - single point Prior Function Level of Independence: Independent with assistive device(s) Comments: ambulates with a cane outside of the home Communication Communication: No difficulties    Cognition  Cognition Arousal/Alertness: Awake/alert Behavior During Therapy: WFL for tasks assessed/performed Overall Cognitive Status: Within Functional Limits for tasks assessed    Extremity/Trunk Assessment Lower Extremity Assessment Lower Extremity Assessment: Overall WFL for tasks assessed Cervical / Trunk Assessment Cervical / Trunk Assessment: Normal   Balance Balance Balance Assessed: Yes Dynamic Standing Balance Dynamic Standing - Balance Support: Right upper extremity supported;During functional activity Dynamic Standing - Level of Assistance: 6: Modified independent (Device/Increase time)  End of Session PT - End of Session Equipment Utilized During Treatment: Gait belt Activity Tolerance: Patient tolerated treatment well Patient left: in chair;with call bell/phone within reach;with chair alarm set;with family/visitor present  GP     Sable Feil 11/29/2012,  10:28 AM

## 2012-11-29 NOTE — Care Management Note (Signed)
    Page 1 of 1   11/29/2012     11:36:03 AM   CARE MANAGEMENT NOTE 11/29/2012  Patient:  [name] [name], [name] [name]   Account Number:  [id number]  Date Initiated:  11/29/2012  Documentation initiated by:  Theophilus Kinds  Subjective/Objective Assessment:   Pt admitted from home with UTI. Pt lives with her son and daughter in law but pt is alone during day when they are working. Pt is still very active in the home with housework. Pt has cane, walker, BSC, and shower chair.     Action/Plan:   Pt has no HH needs at this time. Pt potential discharge home today.   Anticipated DC Date:  11/30/2012   Anticipated DC Plan:  [place]  CM consult      Choice offered to / List presented to:             Status of service:  Completed, signed off Medicare Important Message given?  NA - LOS <3 / Initial given by admissions (If response is "NO", the following Medicare IM given date fields will be blank) Date Medicare IM given:  11/29/2012 Date Additional Medicare IM given:    Discharge Disposition:  HOME/SELF CARE  Per UR Regulation:    If discussed at Long Length of Stay Meetings, dates discussed:    Comments:  11/29/12 [place], RN BSN CM

## 2012-11-29 NOTE — Progress Notes (Signed)
UR chart review completed.  

## 2012-11-29 NOTE — Discharge Summary (Signed)
Physician Discharge Summary  [name] [name] [id number] DOB: 1928-09-28 DOA: 11/27/2012  PCP: Delphina Cahill, MD  Admit date: 11/27/2012 Discharge date: 11/29/2012  Recommendations for Outpatient Follow-up:  1. Followup urine culture, resolution of UTI  2. Followup mild dilatation of pancreatic duct as clinically indicated. Lipase normal. Asymptomatic.  Pending studies:  Urine culture  Follow-up Information   Follow up with Delphina Cahill, MD In 2 weeks.   Specialty:  Internal Medicine   Contact information:    [place] [address] [phone]      Discharge Diagnoses:  1. UTI 2. Generalized weakness 3. Mild elevation of lipase 4. Chronic kidney disease stage IV 5. Chronic right flank pain  Discharge Condition: Improved Disposition: Home  Diet recommendation: Heart healthy  Filed Weights   11/27/12 0649 11/27/12 1141 11/28/12 0515  Weight: 79.379 kg (175 lb) 83.779 kg (184 lb 11.2 oz) 83.6 kg (184 lb 4.9 oz)   History of present illness:  79 year old woman presented to the emergency department with lower abdominal pain and generalized weakness. Initial evaluation was notable for UTI and possible dehydration.  Hospital Course:  [name] was admitted for treatment of UTI. She was treated empirically with IV Rocephin with rapid resolution of pain and generalized weakness. Dehydration corrected with IV fluids. She now feels well and is stable for discharge. Her hospitalization was uncomplicated. See individual issues as below. :  1. Urinary tract infection: Appears clinically resolved. Change to oral antibiotics. Followup urine culture. 2. Generalized weakness: Likely secondary to UTI, possible dehydration. Appears resolved. Did very well physical therapy. 3. Mild elevation of lipase: Resolved. No vomiting or epigastric abdominal pain. Several previous episodes of elevated lipase noted. Mild dilatation of pancreatic duct of unclear significance. No further evaluation  suggested as an inpatient. 4. Chronic kidney disease stage IV: Appears to be at baseline. Suspected component of dehydration. 5. Chronic right flank pain: Chronic issue without recent change. Imaging with ultrasound CT negative for acute process. 6. Hypertension: Stable. Continue Norvasc. 7. History of seizure disorder: Continue Keppra. 8. Chronic pain: Continue fentanyl patch. 9. History of coronary artery disease: Appears stable.  Consultants:  None  Procedures:  none Antibiotics:  Ceftriaxone 8/23 >> 8/25  Ceftin 8/26 >> 8/29  Discharge Instructions  Discharge Orders   Future Orders Complete By Expires   Activity as tolerated - No restrictions  As directed    Diet - low sodium heart healthy  As directed    Discharge instructions  As directed    Comments:     Be sure to finish antibiotic (Ceftin) for bladder infection. Call physician or seek immediate medical attention for fever, increased pain or worsening of condition.       Medication List         ALPRAZolam 0.5 MG tablet  Commonly known as:  XANAX  Take 0.125-0.5 mg by mouth daily as needed. For nerves     amLODipine 5 MG tablet  Commonly known as:  NORVASC  Take 5 mg by mouth daily.     aspirin EC 81 MG tablet  Take 81 mg by mouth at bedtime.     cefUROXime 500 MG tablet  Commonly known as:  CEFTIN  Take 1 tablet (500 mg total) by mouth 2 (two) times daily. Start 8/26 in the morning.     CRESTOR 20 MG tablet  Generic drug:  rosuvastatin  Take 1 tablet by mouth at bedtime.     docusate sodium 50 MG capsule  Commonly known as:  COLACE  Take 100 mg by mouth 2 (two) times daily.     fentaNYL 12 MCG/HR  Commonly known as:  DURAGESIC - dosed mcg/hr  Place 1-2 patches onto the skin every 3 (three) days.     HYDROcodone-acetaminophen 10-500 MG per tablet  Commonly known as:  LORTAB  Take 1 tablet by mouth every 6 (six) hours as needed.     ICAPS PO  Take 1 tablet by mouth at bedtime.      l-methylfolate-B6-B12 3-35-2 MG Tabs  Commonly known as:  METANX  Take 1 tablet by mouth at bedtime.     levETIRAcetam 250 MG tablet  Commonly known as:  KEPPRA  Take 1 tablet (250 mg total) by mouth at bedtime.     nitroGLYCERIN 0.4 MG SL tablet  Commonly known as:  NITROSTAT  Place 0.4 mg under the tongue every 5 (five) minutes as needed. For chest pain     temazepam 15 MG capsule  Commonly known as:  RESTORIL  Take 15 mg by mouth at bedtime as needed. For sleep     VOLTAREN 1 % Gel  Generic drug:  diclofenac sodium  Apply 2 g topically daily as needed (Muscle Pain).       Allergies  Allergen Reactions  . Adhesive [Tape] Other (See Comments)    Tears patients skin off.   . Latex Itching  . Penicillins Hives and Itching  . Sulfa Antibiotics Hives and Itching    The results of significant diagnostics from this hospitalization (including imaging, microbiology, ancillary and laboratory) are listed below for reference.    Significant Diagnostic Studies: Ct Abdomen Pelvis Wo Contrast  11/27/2012   *RADIOLOGY REPORT*  Clinical Data: Right lower quadrant pain this morning, history renal cancer post right nephrectomy, hyperlipidemia, hypertension, bladder cancer  CT ABDOMEN AND PELVIS WITHOUT CONTRAST  Technique:  Multidetector CT imaging of the abdomen and pelvis was performed following the standard protocol without intravenous contrast. Sagittal and coronal MPR images reconstructed from axial data set.  Comparison: 07/24/2010  Findings: Calcified granulomas of lung bases. Minimal bibasilar atelectasis versus scarring. Scattered atherosclerotic calcifications aorta, iliac arteries, coronary arteries and origin of a common celiac/SMA trunk. Post right nephrectomy. Mild prominence of the left ureter to the bladder without obstructing calculus. Deformity of the bladder base with multiple small diverticula.  Within limits of a nonenhanced exam no focal abnormalities of the liver, spleen,  pancreas, or adrenal glands. Stomach and bowel loops normal appearance. No mass, adenopathy, free fluid inflammatory process. Bones demineralized with multiple old pelvic fractures. Prior vertebral compression fracture and augmentation procedure L1. Dextroconvex thoracolumbar scoliosis.  IMPRESSION: Old granulomatous disease at lung bases. Post right nephrectomy. Mild dilatation of the left ureter extending to the urinary bladder where bladder deformity and multiple diverticula are identified, question related to prior bladder resection. However, no gross hydronephrosis is identified.   Original Report Authenticated By: Lavonia Dana, M.D.   US Abdomen Complete  11/27/2012   *RADIOLOGY REPORT*  Clinical Data:  Abdominal pain.  History of a right nephrectomy and bladder carcinoma.  COMPLETE ABDOMINAL ULTRASOUND  Comparison:  Ultrasound, 05/01/2011.  CT, 07/24/2010.  Findings:  Gallbladder:  No gallstones, gallbladder wall thickening, or pericholecystic fluid.  Common bile duct:  Mildly prominent, but less dilated than noted on the prior ultrasound.  The duct measures 5.4 mm.  No duct stone is seen.  Liver:  Liver is normal in overall size.  There is increased parenchymal echogenicity consistent with  fatty infiltration.  No liver mass or focal lesion is seen.  IVC:  Appears normal.  Pancreas:  Echogenic.  No pancreatic masses seen.  The duct is mildly dilated at 4 mm.  It is more dilated than on the prior ultrasound.  Spleen:  Normal measuring 8.9 cm.  Right Kidney:  Right kidney has been removed.  Left Kidney:  Left kidney is echogenic.  There is a 1 cm cyst of the mid pole, stable.  No other renal masses, no stones and no hydronephrosis.  Abdominal aorta:  Atherosclerotic plaque.  No aneurysm.  IMPRESSION: No acute findings.  Hepatic steatosis.  Mild dilation of the pancreatic duct of unclear etiology, mildly increased from the prior study.  No pancreatic mass is seen.  Status post right nephrectomy.  Increased  renal parenchymal echogenicity of the left is consistent with medical renal disease.  Small stable left renal cyst.   Original Report Authenticated By: Lajean Manes, M.D.   Microbiology: Recent Results (from the past 240 hour(s))  URINE CULTURE     Status: None   Collection Time    11/27/12  7:21 AM      Result Value Range Status   Specimen Description URINE, CATHETERIZED   Final   Special Requests NONE   Final   Culture  Setup Time     Final   Value: 11/27/2012 21:07     Performed at [facility]     Final   Value: >=100,000 COLONIES/ML     Performed at [facility]   Culture     Final   Value: [place]     Performed at [facility]   Report Status PENDING   Incomplete     Labs: Basic Metabolic Panel:  Recent Labs Lab 11/27/12 0729 11/28/12 0623  NA 137 140  K 3.5 3.7  CL 102 107  CO2 24 26  GLUCOSE 99 103*  BUN 30* 24*  CREATININE 2.03* 2.05*  CALCIUM 9.4 8.6   Liver Function Tests:  Recent Labs Lab 11/27/12 0729  AST 24  ALT 19  ALKPHOS 73  BILITOT 0.4  PROT 7.3  ALBUMIN 3.3*    Recent Labs Lab 11/27/12 0729 11/28/12 0623  LIPASE 77* 48   CBC:  Recent Labs Lab 11/27/12 0729  WBC 7.9  NEUTROABS 4.1  HGB 12.8  HCT 38.9  MCV 90.3  PLT 200    Principal Problem:   UTI (urinary tract infection) Active Problems:   Weakness generalized   CAD (coronary artery disease)   CKD (chronic kidney disease), stage IV   Time coordinating discharge: 25 minutes  Signed:  Murray Hodgkins, MD [facility] 11/29/2012, 11:41 AM

## 2013-04-11 ENCOUNTER — Ambulatory Visit (HOSPITAL_COMMUNITY)
Admission: RE | Admit: 2013-04-11 | Discharge: 2013-04-11 | Disposition: A | Discharge status: Home / Self Care | Payer: Medicare Other | Source: Ambulatory Visit | Attending: Internal Medicine | Admitting: Internal Medicine

## 2013-04-11 ENCOUNTER — Other Ambulatory Visit (HOSPITAL_COMMUNITY): Payer: Self-pay | Admitting: Internal Medicine

## 2013-04-11 DIAGNOSIS — S79919A Unspecified injury of unspecified hip, initial encounter: Secondary | ICD-10-CM | POA: Insufficient documentation

## 2013-04-11 DIAGNOSIS — M545 Low back pain, unspecified: Secondary | ICD-10-CM

## 2013-04-11 DIAGNOSIS — M25552 Pain in left hip: Secondary | ICD-10-CM

## 2013-04-11 DIAGNOSIS — S79929A Unspecified injury of unspecified thigh, initial encounter: Principal | ICD-10-CM

## 2013-04-11 DIAGNOSIS — X58XXXA Exposure to other specified factors, initial encounter: Secondary | ICD-10-CM | POA: Insufficient documentation

## 2013-04-11 DIAGNOSIS — M25559 Pain in unspecified hip: Secondary | ICD-10-CM | POA: Insufficient documentation

## 2013-05-04 ENCOUNTER — Other Ambulatory Visit: Payer: Self-pay

## 2013-05-04 MED ORDER — L-METHYLFOLATE-B6-B12 3-35-2 MG PO TABS: 1.0000 | ORAL_TABLET | Freq: Every day | ORAL | Status: DC

## 2013-05-14 ENCOUNTER — Emergency Department (HOSPITAL_COMMUNITY)
Admission: EM | Admit: 2013-05-14 | Discharge: 2013-05-14 | Disposition: A | Discharge status: Home / Self Care | Payer: Medicare Other | Attending: Emergency Medicine | Admitting: Emergency Medicine

## 2013-05-14 ENCOUNTER — Encounter (HOSPITAL_COMMUNITY): Payer: Self-pay | Admitting: Emergency Medicine

## 2013-05-14 DIAGNOSIS — M5430 Sciatica, unspecified side: Secondary | ICD-10-CM | POA: Insufficient documentation

## 2013-05-14 DIAGNOSIS — Z8551 Personal history of malignant neoplasm of bladder: Secondary | ICD-10-CM | POA: Insufficient documentation

## 2013-05-14 DIAGNOSIS — Z91040 Latex allergy status: Secondary | ICD-10-CM | POA: Insufficient documentation

## 2013-05-14 DIAGNOSIS — G8929 Other chronic pain: Secondary | ICD-10-CM | POA: Insufficient documentation

## 2013-05-14 DIAGNOSIS — Z79899 Other long term (current) drug therapy: Secondary | ICD-10-CM | POA: Insufficient documentation

## 2013-05-14 DIAGNOSIS — I2510 Atherosclerotic heart disease of native coronary artery without angina pectoris: Secondary | ICD-10-CM | POA: Insufficient documentation

## 2013-05-14 DIAGNOSIS — E782 Mixed hyperlipidemia: Secondary | ICD-10-CM | POA: Insufficient documentation

## 2013-05-14 DIAGNOSIS — I10 Essential (primary) hypertension: Secondary | ICD-10-CM | POA: Insufficient documentation

## 2013-05-14 DIAGNOSIS — Z7982 Long term (current) use of aspirin: Secondary | ICD-10-CM | POA: Insufficient documentation

## 2013-05-14 DIAGNOSIS — Z880 Allergy status to penicillin: Secondary | ICD-10-CM | POA: Insufficient documentation

## 2013-05-14 DIAGNOSIS — M5431 Sciatica, right side: Secondary | ICD-10-CM

## 2013-05-14 DIAGNOSIS — G40401 Other generalized epilepsy and epileptic syndromes, not intractable, with status epilepticus: Secondary | ICD-10-CM | POA: Insufficient documentation

## 2013-05-14 DIAGNOSIS — Z951 Presence of aortocoronary bypass graft: Secondary | ICD-10-CM | POA: Insufficient documentation

## 2013-05-14 DIAGNOSIS — E669 Obesity, unspecified: Secondary | ICD-10-CM | POA: Insufficient documentation

## 2013-05-14 MED ORDER — LORAZEPAM 1 MG PO TABS
1.0000 mg | ORAL_TABLET | Freq: Once | ORAL | Status: AC
  Administered 2013-05-14: 1 mg via ORAL
  Filled 2013-05-14: qty 1

## 2013-05-14 MED ORDER — HYDROMORPHONE HCL PF 1 MG/ML IJ SOLN
1.0000 mg | Freq: Once | INTRAMUSCULAR | Status: AC
  Administered 2013-05-14: 1 mg via INTRAMUSCULAR
  Filled 2013-05-14: qty 1

## 2013-05-14 MED ORDER — DEXAMETHASONE SODIUM PHOSPHATE 4 MG/ML IJ SOLN
12.0000 mg | Freq: Once | INTRAMUSCULAR | Status: AC
  Administered 2013-05-14: 12 mg via INTRAMUSCULAR
  Filled 2013-05-14: qty 3

## 2013-05-14 MED ORDER — DIAZEPAM 5 MG PO TABS: 2.5000 mg | ORAL_TABLET | Freq: Four times a day (QID) | ORAL | Status: DC | PRN

## 2013-05-14 MED ORDER — HYDROMORPHONE HCL 2 MG PO TABS: 2.0000 mg | ORAL_TABLET | ORAL | Status: DC | PRN

## 2013-05-14 NOTE — ED Notes (Signed)
Patient with no complaints at this time. Respirations even and unlabored. Skin warm/dry. Discharge instructions reviewed with patient at this time. Patient given opportunity to voice concerns/ask questions. Patient discharged at this time and left Emergency Department with steady gait.   

## 2013-05-14 NOTE — Discharge Instructions (Signed)
Sciatica °Sciatica is pain, weakness, numbness, or tingling along the path of the sciatic nerve. The nerve starts in the lower back and runs down the back of each leg. The nerve controls the muscles in the lower leg and in the back of the knee, while also providing sensation to the back of the thigh, lower leg, and the sole of your foot. Sciatica is a symptom of another medical condition. For instance, nerve damage or certain conditions, such as a herniated disk or bone spur on the spine, pinch or put pressure on the sciatic nerve. This causes the pain, weakness, or other sensations normally associated with sciatica. Generally, sciatica only affects one side of the body. °CAUSES  °· Herniated or slipped disc. °· Degenerative disk disease. °· A pain disorder involving the narrow muscle in the buttocks (piriformis syndrome). °· Pelvic injury or fracture. °· Pregnancy. °· Tumor (rare). °SYMPTOMS  °Symptoms can vary from mild to very severe. The symptoms usually travel from the low back to the buttocks and down the back of the leg. Symptoms can include: °· Mild tingling or dull aches in the lower back, leg, or hip. °· Numbness in the back of the calf or sole of the foot. °· Burning sensations in the lower back, leg, or hip. °· Sharp pains in the lower back, leg, or hip. °· Leg weakness. °· Severe back pain inhibiting movement. °These symptoms may get worse with coughing, sneezing, laughing, or prolonged sitting or standing. Also, being overweight may worsen symptoms. °DIAGNOSIS  °Your caregiver will perform a physical exam to look for common symptoms of sciatica. He or she may ask you to do certain movements or activities that would trigger sciatic nerve pain. Other tests may be performed to find the cause of the sciatica. These may include: °· Blood tests. °· X-rays. °· Imaging tests, such as an MRI or CT scan. °TREATMENT  °Treatment is directed at the cause of the sciatic pain. Sometimes, treatment is not necessary  and the pain and discomfort goes away on its own. If treatment is needed, your caregiver may suggest: °· Over-the-counter medicines to relieve pain. °· Prescription medicines, such as anti-inflammatory medicine, muscle relaxants, or narcotics. °· Applying heat or ice to the painful area. °· Steroid injections to lessen pain, irritation, and inflammation around the nerve. °· Reducing activity during periods of pain. °· Exercising and stretching to strengthen your abdomen and improve flexibility of your spine. Your caregiver may suggest losing weight if the extra weight makes the back pain worse. °· Physical therapy. °· Surgery to eliminate what is pressing or pinching the nerve, such as a bone spur or part of a herniated disk. °HOME CARE INSTRUCTIONS  °· Only take over-the-counter or prescription medicines for pain or discomfort as directed by your caregiver. °· Apply ice to the affected area for 20 minutes, 3 4 times a day for the first 48 72 hours. Then try heat in the same way. °· Exercise, stretch, or perform your usual activities if these do not aggravate your pain. °· Attend physical therapy sessions as directed by your caregiver. °· Keep all follow-up appointments as directed by your caregiver. °· Do not wear high heels or shoes that do not provide proper support. °· Check your mattress to see if it is too soft. A firm mattress may lessen your pain and discomfort. °SEEK IMMEDIATE MEDICAL CARE IF:  °· You lose control of your bowel or bladder (incontinence). °· You have increasing weakness in the lower back,   pelvis, buttocks, or legs. °· You have redness or swelling of your back. °· You have a burning sensation when you urinate. °· You have pain that gets worse when you lie down or awakens you at night. °· Your pain is worse than you have experienced in the past. °· Your pain is lasting longer than 4 weeks. °· You are suddenly losing weight without reason. °MAKE SURE YOU: °· Understand these  instructions. °· Will watch your condition. °· Will get help right away if you are not doing well or get worse. °Document Released: 03/18/2001 Document Revised: 09/23/2011 Document Reviewed: 08/03/2011 °[employer]® Patient Information ©2014 [employer]. ° °

## 2013-05-14 NOTE — ED Provider Notes (Signed)
CSN: [id number]     Arrival date & time 05/14/13  1205 History   First MD Initiated Contact with Patient 05/14/13 1348     Chief Complaint  Patient presents with  . Hip Pain   (Consider location/radiation/quality/duration/timing/severity/associated sxs/prior Treatment) HPI  80 year old female with right lower extremity pain. Gradual onset about 3 days ago but significantly worse within the past day. No trauma. Mild ache at rest which is significantly worse with any type of movement. Pain is in her right lower back right buttock with radiation down her leg to her foot. No numbness or tingling. No urinary complaints. No fevers or chills. No rash. Hx of chronic back pain, but this is different. Took a hydrocodone earlier today w/o much relief.   Past Medical History  Diagnosis Date  . Mixed hyperlipidemia   . 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 Essential hypertension, benign   . Postsurgical aortocoronary bypass status   . Bladder cancer   . Dizziness   . Obesity   . Back pain   . CAD (coronary artery disease)   . Seizure    Past Surgical History  Procedure Laterality Date  . Cataract extraction    . Coronary artery bypass graft    . Iridectomy    . Nephrectomy  2000   Family History  Problem Relation Age of Onset  . Stroke    . Coronary artery disease    . Cancer    . Diabetes    . Colon cancer Mother   . Heart attack Father   . Diabetes Father    History  Substance Use Topics  . Smoking status: Never Smoker   . Smokeless tobacco: Never Used  . Alcohol Use: No   OB History   Grav Para Term Preterm Abortions TAB SAB Ect Mult Living                 Review of Systems  All systems reviewed and negative, other than as noted in HPI.   Allergies  Adhesive; Latex; Penicillins; and Sulfa antibiotics  Home Medications   Current Outpatient Rx  Name  Route  Sig  Dispense  Refill  . ALPRAZolam (XANAX) 0.5 MG tablet   Oral   Take 0.125-0.5 mg by mouth daily as needed. For  nerves         . amLODipine (NORVASC) 5 MG tablet   Oral   Take 5 mg by mouth daily.         Marland Kitchen aspirin EC 81 MG tablet   Oral   Take 81 mg by mouth at bedtime.          . CRESTOR 20 MG tablet   Oral   Take 1 tablet by mouth at bedtime.          . docusate sodium (COLACE) 100 MG capsule   Oral   Take 100 mg by mouth every other day.         . fentaNYL (DURAGESIC - DOSED MCG/HR) 12 MCG/HR   Transdermal   Place 1-2 patches onto the skin every 3 (three) days.         Marland Kitchen gabapentin (NEURONTIN) 100 MG capsule   Oral   Take 300 mg by mouth at bedtime.         Marland Kitchen HYDROcodone-acetaminophen (LORTAB) 10-500 MG per tablet   Oral   Take 1 tablet by mouth every 6 (six) hours as needed for pain.          Marland Kitchen  l-methylfolate-B6-B12 (METANX) 3-35-2 MG TABS   Oral   Take 1 tablet by mouth at bedtime.   90 tablet   1   . levETIRAcetam (KEPPRA) 250 MG tablet   Oral   Take 1 tablet (250 mg total) by mouth at bedtime.   90 tablet   3   . Multiple Vitamins-Minerals (ICAPS PO)   Oral   Take 1 tablet by mouth at bedtime.         . nitroGLYCERIN (NITROSTAT) 0.4 MG SL tablet   Sublingual   Place 0.4 mg under the tongue every 5 (five) minutes as needed for chest pain.         Marland Kitchen temazepam (RESTORIL) 15 MG capsule   Oral   Take 15 mg by mouth at bedtime as needed for sleep.          . VOLTAREN 1 % GEL   Topical   Apply 2 g topically daily as needed (Muscle Pain).           BP 166/63  Pulse 62  Temp(Src) 97.9 F (36.6 C) (Oral)  Resp 18  Ht 5\' 2"  (1.575 m)  Wt 186 lb (84.369 kg)  BMI 34.01 kg/m2  SpO2 99% Physical Exam  Nursing note and vitals reviewed. Constitutional: She appears well-developed and well-nourished. No distress.  HENT:  Head: Normocephalic and atraumatic.  Eyes: Conjunctivae are normal. Right eye exhibits no discharge. Left eye exhibits no discharge.  Neck: Neck supple.  Cardiovascular: Normal rate, regular rhythm and normal heart sounds.   Exam reveals no gallop and no friction rub.   No murmur heard. Pulmonary/Chest: Effort normal and breath sounds normal. No respiratory distress.  Abdominal: Soft. She exhibits no distension. There is no tenderness.  Musculoskeletal: She exhibits no edema and no tenderness.  Tenderness along R buttock and posterior proximal thigh. No midline tenderness. Positive straight leg test.   Neurological: She is alert.  Strength 5/5 b/l hip and knee with flex and extension. Strong plantar and dorsiflexion. Easily palpable DP pulses. Feet warm to touch. No concerning skin changes. No palpable cords. No edema. Calves symmetric.   Skin: Skin is warm and dry.  Psychiatric: She has a normal mood and affect. Her behavior is normal. Thought content normal.    ED Course  Procedures (including critical care time) Labs Review Labs Reviewed - No data to display Imaging Review No results found.  EKG Interpretation   None       MDM   1. Sciatica of right side    80 year old female with exam and symptoms consistent with sciatica. Neurovascularly intact. No signs of infection. Patient has some concern for DVT, but I clinically doubt. Plan symptomatic tx at this time.  Return precautions were discussed. Outpatient followup.    Virgel Manifold, MD 05/14/13 [phone]

## 2013-05-14 NOTE — ED Notes (Signed)
Pain in right hip radiating into right lower leg

## 2013-05-14 NOTE — ED Notes (Signed)
MD at bedside. 

## 2013-06-11 ENCOUNTER — Encounter (HOSPITAL_COMMUNITY): Payer: Self-pay | Admitting: Emergency Medicine

## 2013-06-11 ENCOUNTER — Emergency Department (HOSPITAL_COMMUNITY)
Admission: EM | Admit: 2013-06-11 | Discharge: 2013-06-11 | Disposition: A | Discharge status: Home / Self Care | Payer: Medicare Other | Attending: Emergency Medicine | Admitting: Emergency Medicine

## 2013-06-11 ENCOUNTER — Emergency Department (HOSPITAL_COMMUNITY): Payer: Medicare Other

## 2013-06-11 DIAGNOSIS — G8929 Other chronic pain: Secondary | ICD-10-CM | POA: Insufficient documentation

## 2013-06-11 DIAGNOSIS — Z91040 Latex allergy status: Secondary | ICD-10-CM | POA: Insufficient documentation

## 2013-06-11 DIAGNOSIS — E782 Mixed hyperlipidemia: Secondary | ICD-10-CM | POA: Insufficient documentation

## 2013-06-11 DIAGNOSIS — M25559 Pain in unspecified hip: Secondary | ICD-10-CM | POA: Insufficient documentation

## 2013-06-11 DIAGNOSIS — M545 Low back pain, unspecified: Secondary | ICD-10-CM | POA: Insufficient documentation

## 2013-06-11 DIAGNOSIS — N390 Urinary tract infection, site not specified: Secondary | ICD-10-CM

## 2013-06-11 DIAGNOSIS — N189 Chronic kidney disease, unspecified: Secondary | ICD-10-CM

## 2013-06-11 DIAGNOSIS — Z79899 Other long term (current) drug therapy: Secondary | ICD-10-CM | POA: Insufficient documentation

## 2013-06-11 DIAGNOSIS — R0602 Shortness of breath: Secondary | ICD-10-CM | POA: Insufficient documentation

## 2013-06-11 DIAGNOSIS — R109 Unspecified abdominal pain: Secondary | ICD-10-CM

## 2013-06-11 DIAGNOSIS — I129 Hypertensive chronic kidney disease with stage 1 through stage 4 chronic kidney disease, or unspecified chronic kidney disease: Secondary | ICD-10-CM | POA: Insufficient documentation

## 2013-06-11 DIAGNOSIS — Z8551 Personal history of malignant neoplasm of bladder: Secondary | ICD-10-CM | POA: Insufficient documentation

## 2013-06-11 DIAGNOSIS — Z9221 Personal history of antineoplastic chemotherapy: Secondary | ICD-10-CM | POA: Insufficient documentation

## 2013-06-11 DIAGNOSIS — Z951 Presence of aortocoronary bypass graft: Secondary | ICD-10-CM | POA: Insufficient documentation

## 2013-06-11 DIAGNOSIS — G40401 Other generalized epilepsy and epileptic syndromes, not intractable, with status epilepticus: Secondary | ICD-10-CM | POA: Insufficient documentation

## 2013-06-11 DIAGNOSIS — Z85528 Personal history of other malignant neoplasm of kidney: Secondary | ICD-10-CM | POA: Insufficient documentation

## 2013-06-11 DIAGNOSIS — Z9089 Acquired absence of other organs: Secondary | ICD-10-CM | POA: Insufficient documentation

## 2013-06-11 DIAGNOSIS — Z880 Allergy status to penicillin: Secondary | ICD-10-CM | POA: Insufficient documentation

## 2013-06-11 DIAGNOSIS — E669 Obesity, unspecified: Secondary | ICD-10-CM | POA: Insufficient documentation

## 2013-06-11 DIAGNOSIS — I2510 Atherosclerotic heart disease of native coronary artery without angina pectoris: Secondary | ICD-10-CM | POA: Insufficient documentation

## 2013-06-11 DIAGNOSIS — Z7982 Long term (current) use of aspirin: Secondary | ICD-10-CM | POA: Insufficient documentation

## 2013-06-11 LAB — COMPREHENSIVE METABOLIC PANEL
ALK PHOS: 72 U/L (ref 39–117)
ALT: 16 U/L (ref 0–35)
AST: 26 U/L (ref 0–37)
Albumin: 3.4 g/dL — ABNORMAL LOW (ref 3.5–5.2)
BILIRUBIN TOTAL: 0.5 mg/dL (ref 0.3–1.2)
BUN: 26 mg/dL — AB (ref 6–23)
CHLORIDE: 103 meq/L (ref 96–112)
CO2: 26 meq/L (ref 19–32)
Calcium: 9.6 mg/dL (ref 8.4–10.5)
Creatinine, Ser: 1.71 mg/dL — ABNORMAL HIGH (ref 0.50–1.10)
GFR calc Af Amer: 31 mL/min — ABNORMAL LOW (ref >90)
GFR calc non Af Amer: 27 mL/min — ABNORMAL LOW (ref >90)
GLUCOSE: 96 mg/dL (ref 70–99)
POTASSIUM: 4.1 meq/L (ref 3.7–5.3)
SODIUM: 140 meq/L (ref 137–147)
TOTAL PROTEIN: 7.4 g/dL (ref 6.0–8.3)

## 2013-06-11 LAB — URINE MICROSCOPIC-ADD ON

## 2013-06-11 LAB — URINALYSIS, ROUTINE W REFLEX MICROSCOPIC
Bilirubin Urine: NEGATIVE
Bilirubin Urine: NEGATIVE
GLUCOSE, UA: NEGATIVE mg/dL
Glucose, UA: NEGATIVE mg/dL
Hgb urine dipstick: NEGATIVE
Hgb urine dipstick: NEGATIVE
Ketones, ur: NEGATIVE mg/dL
Ketones, ur: NEGATIVE mg/dL
NITRITE: NEGATIVE
Nitrite: NEGATIVE
PH: 5.5 (ref 5.0–8.0)
Specific Gravity, Urine: >1.03 — ABNORMAL HIGH (ref 1.005–1.030)
Specific Gravity, Urine: >1.03 — ABNORMAL HIGH (ref 1.005–1.030)
UROBILINOGEN UA: 0.2 mg/dL (ref 0.0–1.0)
Urobilinogen, UA: 0.2 mg/dL (ref 0.0–1.0)
pH: 5.5 (ref 5.0–8.0)

## 2013-06-11 LAB — CBC WITH DIFFERENTIAL/PLATELET
Basophils Absolute: 0 10*3/uL (ref 0.0–0.1)
Basophils Relative: 0 % (ref 0–1)
EOS ABS: 0.3 10*3/uL (ref 0.0–0.7)
Eosinophils Relative: 4 % (ref 0–5)
HCT: 39 % (ref 36.0–46.0)
HEMOGLOBIN: 12.8 g/dL (ref 12.0–15.0)
LYMPHS ABS: 2.5 10*3/uL (ref 0.7–4.0)
LYMPHS PCT: 33 % (ref 12–46)
MCH: 29.3 pg (ref 26.0–34.0)
MCHC: 32.8 g/dL (ref 30.0–36.0)
MCV: 89.2 fL (ref 78.0–100.0)
MONOS PCT: 10 % (ref 3–12)
Monocytes Absolute: 0.7 10*3/uL (ref 0.1–1.0)
NEUTROS ABS: 4.1 10*3/uL (ref 1.7–7.7)
NEUTROS PCT: 54 % (ref 43–77)
PLATELETS: 208 10*3/uL (ref 150–400)
RBC: 4.37 MIL/uL (ref 3.87–5.11)
RDW: 15.6 % — ABNORMAL HIGH (ref 11.5–15.5)
WBC: 7.7 10*3/uL (ref 4.0–10.5)

## 2013-06-11 LAB — LIPASE, BLOOD: Lipase: 41 U/L (ref 11–59)

## 2013-06-11 MED ORDER — IOHEXOL 300 MG/ML  SOLN
50.0000 mL | Freq: Once | INTRAMUSCULAR | Status: DC | PRN
Start: 2013-06-11 — End: 2013-06-12

## 2013-06-11 MED ORDER — OXYCODONE-ACETAMINOPHEN 5-325 MG PO TABS
2.0000 | ORAL_TABLET | Freq: Once | ORAL | Status: AC
  Administered 2013-06-11: 2 via ORAL
  Filled 2013-06-11: qty 2

## 2013-06-11 MED ORDER — CEPHALEXIN 500 MG PO CAPS
1000.0000 mg | ORAL_CAPSULE | Freq: Once | ORAL | Status: AC
Start: 2013-06-11 — End: 2013-06-11
  Administered 2013-06-11: 1000 mg via ORAL
  Filled 2013-06-11: qty 2

## 2013-06-11 MED ORDER — OXYCODONE-ACETAMINOPHEN 5-325 MG PO TABS: 2.0000 | ORAL_TABLET | Freq: Four times a day (QID) | ORAL | Status: DC | PRN

## 2013-06-11 MED ORDER — HYDROMORPHONE HCL PF 1 MG/ML IJ SOLN
0.5000 mg | Freq: Once | INTRAMUSCULAR | Status: AC
  Administered 2013-06-11: 0.5 mg via INTRAVENOUS
  Filled 2013-06-11: qty 1

## 2013-06-11 MED ORDER — CEPHALEXIN 500 MG PO CAPS: ORAL_CAPSULE | ORAL | Status: DC

## 2013-06-11 NOTE — Discharge Instructions (Signed)
SEEK IMMEDIATE MEDICAL ATTENTION IF: New numbness, tingling, weakness, or problem with the use of your arms or legs.  Severe back pain not relieved with medications.  Change in bowel or bladder control.  Increasing pain in any areas of the body (such as chest or abdominal pain).  Shortness of breath, dizziness or fainting.  Nausea (feeling sick to your stomach), vomiting, fever, or sweats.  Abdominal (belly) pain can be caused by many things. Your caregiver performed an examination and possibly ordered blood/urine tests and imaging (CT scan, x-rays, ultrasound). Many cases can be observed and treated at home after initial evaluation in the emergency department. Even though you are being discharged home, abdominal pain can be unpredictable. Therefore, you need a repeated exam if your pain does not resolve, returns, or worsens. Most patients with abdominal pain don't have to be admitted to the hospital or have surgery, but serious problems like appendicitis and gallbladder attacks can start out as nonspecific pain. Many abdominal conditions cannot be diagnosed in one visit, so follow-up evaluations are very important. SEEK IMMEDIATE MEDICAL ATTENTION IF: The pain does not go away or becomes severe.  A temperature above 101 develops.  Repeated vomiting occurs (multiple episodes).  The pain becomes localized to portions of the abdomen. The right side could possibly be appendicitis. In an adult, the left lower portion of the abdomen could be colitis or diverticulitis.  Blood is being passed in stools or vomit (bright red or black tarry stools).  Return also if you develop chest pain, difficulty breathing, dizziness or fainting, or become confused, poorly responsive, or inconsolable (young children).

## 2013-06-11 NOTE — ED Provider Notes (Signed)
CSN: [id number]     Arrival date & time 06/11/13  1602 History  This chart was scribed for Babette Relic, MD by Zettie Pho, ED Scribe. This patient was seen in room APA19/[facility]19 and the patient's care was started at 6:32 PM.    Chief Complaint  Patient presents with  . Flank Pain   The history is provided by the patient. No language interpreter was used.   HPI Comments: [name] [name] is a 80 y.o. female with a history of kidney cancer with right kidney removal, bladder cancer with chemotherapy several years ago, and chronic back pain who presents to the Emergency Department complaining of a constant pain to the right side of the lower back, right flank, and right hip that she states has been ongoing 24/7 for the past month, but has been progressively worsening. Patient denies any potential trauma or injury to the area. She states that the pain is exacerbated with movement and walking/bearing weight, but alleviating with laying still/rest. Patient uses a cane for normal, daily ambulation and has been walking with a limp secondary to pain. She reports some associated, intermittent burning dysuria with hesitancy. Patient was seen here one month ago for similar complaints and diagnosed with sciatica and treated symptomatically. Patient is on a fentanyl patch for her chronic pain and Norco 10-325 mg for breakthrough pain, but states that the hydrocodone has not been effective at providing significant relief of her current pain. Patient reports some chronic shortness of breath at baseline and denies any recent changes. She denies weakness, numbness, change in bowel or bladder function, fever, chest pain, emesis, diarrhea. She denies history of IV drug use or back surgeries. Patient reports a history of appendectomy. Patient also has a history of epilepsy with grand mal seizures, hyperlipidemia, HTN, CAD.   Past Medical History  Diagnosis Date  . Mixed hyperlipidemia   . 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  Essential hypertension, benign   . Postsurgical aortocoronary bypass status   . Bladder cancer   . Dizziness   . Obesity   . Back pain   . CAD (coronary artery disease)   . Seizure    Past Surgical History  Procedure Laterality Date  . Cataract extraction    . Coronary artery bypass graft    . Iridectomy    . Nephrectomy  2000   Family History  Problem Relation Age of Onset  . Stroke    . Coronary artery disease    . Cancer    . Diabetes    . Colon cancer Mother   . Heart attack Father   . Diabetes Father    History  Substance Use Topics  . Smoking status: Never Smoker   . Smokeless tobacco: Never Used  . Alcohol Use: No   OB History   Grav Para Term Preterm Abortions TAB SAB Ect Mult Living   3 3 3       3      Review of Systems  A complete 10 system review of systems was obtained and all systems are negative except as noted in the HPI and PMH.    Allergies  Adhesive; Latex; Penicillins; and Sulfa antibiotics  Home Medications   Current Outpatient Rx  Name  Route  Sig  Dispense  Refill  . ALPRAZolam (XANAX) 0.5 MG tablet   Oral   Take 0.125-0.5 mg by mouth daily as needed. For nerves         . amLODipine (NORVASC) 5  MG tablet   Oral   Take 5 mg by mouth daily.         Marland Kitchen aspirin EC 81 MG tablet   Oral   Take 81 mg by mouth at bedtime.          . CRESTOR 20 MG tablet   Oral   Take 1 tablet by mouth at bedtime.          . docusate sodium (COLACE) 100 MG capsule   Oral   Take 100 mg by mouth 2 (two) times daily.          . fentaNYL (DURAGESIC - DOSED MCG/HR) 12 MCG/HR   Transdermal   Place 12 mcg onto the skin every 3 (three) days.         Marland Kitchen gabapentin (NEURONTIN) 100 MG capsule   Oral   Take 300 mg by mouth at bedtime.         Marland Kitchen HYDROcodone-acetaminophen (NORCO) 10-325 MG per tablet   Oral   Take 1 tablet by mouth every 6 (six) hours as needed.         Marland Kitchen l-methylfolate-B6-B12 (METANX) 3-35-2 MG TABS   Oral   Take 1 tablet  by mouth at bedtime.   90 tablet   1   . levETIRAcetam (KEPPRA) 250 MG tablet   Oral   Take 1 tablet (250 mg total) by mouth at bedtime.   90 tablet   3   . Multiple Vitamins-Minerals (ICAPS PO)   Oral   Take 1 tablet by mouth at bedtime.         . nitroGLYCERIN (NITROSTAT) 0.4 MG SL tablet   Sublingual   Place 0.4 mg under the tongue every 5 (five) minutes as needed for chest pain.         Marland Kitchen temazepam (RESTORIL) 15 MG capsule   Oral   Take 15 mg by mouth at bedtime as needed for sleep.          . VOLTAREN 1 % GEL   Topical   Apply 2 g topically daily as needed (Muscle Pain).          . cephALEXin (KEFLEX) 500 MG capsule      2 caps po bid x 7 days   28 capsule   0   . oxyCODONE-acetaminophen (PERCOCET) 5-325 MG per tablet   Oral   Take 2 tablets by mouth every 6 (six) hours as needed for severe pain.   20 tablet   0    Triage Vitals: BP 148/49  Pulse 75  Temp(Src) 98.1 F (36.7 C) (Oral)  Resp 18  Ht 5\' 2"  (1.575 m)  Wt 183 lb (83.008 kg)  BMI 33.46 kg/m2  SpO2 95%  Physical Exam  Nursing note and vitals reviewed. Constitutional:  Awake, alert, nontoxic appearance.  HENT:  Head: Atraumatic.  Eyes: Pupils are equal, round, and reactive to light. Right eye exhibits no discharge. Left eye exhibits no discharge.  Neck: Neck supple.  Cardiovascular: Normal rate, regular rhythm and intact distal pulses.   No murmur heard. Pulses:      Dorsalis pedis pulses are 2+ on the right side, and 2+ on the left side.  Pulmonary/Chest: Effort normal and breath sounds normal. No respiratory distress. She has no wheezes. She has no rales. She exhibits no tenderness.  Abdominal: Soft. Bowel sounds are normal. She exhibits no mass. There is tenderness. There is no rebound.  Mild-moderate tenderness to palpation to the RLQ and RUQ. Remainder  of the abdomen is non-tender.   Musculoskeletal: She exhibits no tenderness.       Thoracic back: She exhibits no tenderness.        Lumbar back: She exhibits no tenderness.  Bilateral lower extremities non tender without new rashes or color change, baseline ROM with intact DP pulses, CR<2 secs all digits bilaterally, sensation baseline light touch bilaterally for pt, DTR's symmetric and intact bilaterally KJ / AJ, motor symmetric bilateral 5 / 5 hip flexion, quadriceps, hamstrings, EHL, foot dorsiflexion, foot plantarflexion, gait somewhat antalgic but without apparent new ataxia.  Diffuse lumbar and lumbar paraspinal tenderness.  Right lateral hip is diffusely tender.   Neurological: She has normal reflexes. She displays normal reflexes.  Mental status baseline for patient.  Upper extremity motor strength and sensation intact and symmetric bilaterally. Antalgic gait without ataxia.   Skin: No rash noted.  Psychiatric: She has a normal mood and affect.    ED Course  Procedures (including critical care time)  DIAGNOSTIC STUDIES: Oxygen Saturation is 95% on room air, normal by my interpretation.    COORDINATION OF CARE: 6:49 PM- Will order a CT of the abdomen, UA, and blood labs (CBC, CMP, lipase). Will order Dilaudid to manage symptoms. Discussed treatment plan with patient at bedside and patient verbalized agreement.   10:01 PM- Discussed that CT results were negative for acute changes. Discussed that UA indicate a UTI and Pt has CRI, but that labs were otherwise normal. Will order and discharge patient with antibiotics and Percocet to manage symptoms. Discussed treatment plan with patient at bedside and patient verbalized agreement.    Labs Review Labs Reviewed  CBC WITH DIFFERENTIAL - Abnormal; Notable for the following:    RDW 15.6 (*)    All other components within normal limits  COMPREHENSIVE METABOLIC PANEL - Abnormal; Notable for the following:    BUN 26 (*)    Creatinine, Ser 1.71 (*)    Albumin 3.4 (*)    GFR calc non Af Amer 27 (*)    GFR calc Af Amer 31 (*)    All other components within normal  limits  URINALYSIS, ROUTINE W REFLEX MICROSCOPIC - Abnormal; Notable for the following:    APPearance CLOUDY (*)    Specific Gravity, Urine >1.030 (*)    Protein, ur TRACE (*)    Leukocytes, UA TRACE (*)    All other components within normal limits  URINE MICROSCOPIC-ADD ON - Abnormal; Notable for the following:    Squamous Epithelial / LPF MANY (*)    Bacteria, UA MANY (*)    All other components within normal limits  URINALYSIS, ROUTINE W REFLEX MICROSCOPIC - Abnormal; Notable for the following:    Specific Gravity, Urine >1.030 (*)    Protein, ur TRACE (*)    Leukocytes, UA TRACE (*)    All other components within normal limits  URINE MICROSCOPIC-ADD ON - Abnormal; Notable for the following:    Squamous Epithelial / LPF FEW (*)    Bacteria, UA MANY (*)    All other components within normal limits  LIPASE, BLOOD   Imaging Review Ct Abdomen Pelvis Wo Contrast  06/11/2013   CLINICAL DATA:  Right flank pain. Right hip pain for 1 month. History of kidney carcinoma with right kidney removed. History of bladder carcinoma chronic back pain.  EXAM: CT ABDOMEN AND PELVIS WITHOUT CONTRAST  TECHNIQUE: Multidetector CT imaging of the abdomen and pelvis was performed following the standard protocol without intravenous contrast.  COMPARISON:  11/27/2012  FINDINGS: Heart is normal in size. There are changes from aortic valve replacement. There is reticular lung base scarring. The fat containing hernias noted along the posterior medial left lung base.  Liver, spleen, gallbladder and pancreas are unremarkable. No bile duct dilation. No adrenal masses.  No left renal mass or stone. No hydronephrosis. Ureters normal course and caliber.  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 This is stable appearance from the prior exam. No bladder mass or stone.  No pathologically enlarged lymph nodes. No abnormal fluid collections. Stable atherosclerotic calcifications along the abdominal aorta and iliac arteries.   Mild increased stool throughout the colon. There are scattered colonic diverticula. No diverticulitis. Small bowel is unremarkable. Appendix has been removed.  Compression fracture of L1 has been treated with vertebroplasty. No acute fractures. There are degenerative changes noted throughout the visualized spine. No osteoblastic or osteolytic lesions.  IMPRESSION: 1. No acute findings. 2. Stable appearance of the CT from the prior exam. 3. Status post right nephrectomy. No left renal mass. No left hydronephrosis. 4. 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 No bladder mass or stone. 5. No evidence of metastatic disease from renal cell carcinoma or bladder carcinoma.   Electronically Signed   By: Lajean Manes M.D.   On: 06/11/2013 20:38     EKG Interpretation None      MDM   Final diagnoses:  Low back pain  Abdominal pain  UTI (urinary tract infection)  CRI (chronic renal insufficiency)    I doubt any other EMC precluding discharge at this time including, but not necessarily limited to the following:SBI, cauda equina syndrome.  I personally performed the services described in this documentation, which was scribed in my presence. The recorded information has been reviewed and is accurate.     Babette Relic, MD 06/12/13 1430

## 2013-06-11 NOTE — ED Notes (Signed)
Patient c/o right side pain. Patient denies weakness. Per patient was seen here 1 month ago for same reason and told she had sciatica. Per patient pain worse today then last month. Patient reports right kidney removed due to cancer and states that the cancer has metastasized to bladder.

## 2013-10-27 ENCOUNTER — Other Ambulatory Visit: Payer: Self-pay

## 2013-10-27 MED ORDER — L-METHYLFOLATE-B6-B12 3-35-2 MG PO TABS: 1.0000 | ORAL_TABLET | Freq: Every day | ORAL | Status: DC

## 2013-11-01 ENCOUNTER — Telehealth: Payer: Self-pay | Admitting: Nurse Practitioner

## 2013-11-01 NOTE — Telephone Encounter (Signed)
Rx was sent on 07/23.  I called back.  Spoke with [employer].  She said they are having an ongoing issue with their system giving them rejections, and they have IT looking into it.  They have Rx we sent and will call us back if anything further is needed.

## 2013-11-01 NOTE — Telephone Encounter (Signed)
[facility] calling to get more information on patient's Metanx refill, states that they sent refill request and got a reply back as "refill not appropriate" and wanted to get more information. Please call and advise.

## 2013-11-03 ENCOUNTER — Other Ambulatory Visit: Payer: Self-pay | Admitting: Nurse Practitioner

## 2013-11-04 ENCOUNTER — Telehealth: Payer: Self-pay | Admitting: Nurse Practitioner

## 2013-11-04 ENCOUNTER — Other Ambulatory Visit (HOSPITAL_COMMUNITY): Payer: Self-pay | Admitting: Internal Medicine

## 2013-11-04 DIAGNOSIS — Z1231 Encounter for screening mammogram for malignant neoplasm of breast: Secondary | ICD-10-CM

## 2013-11-04 NOTE — Telephone Encounter (Signed)
[name] [name] DOB:  [id number] Saw [name] last summer 2014.  Family is questioning if she has she been released from Lynn's care. They want to know if they need to make a yearly follow-up visit or let the primary care physician handle her prescription refills.  Best number to reach them is [phone] - Ok to leave message

## 2013-11-07 NOTE — Telephone Encounter (Signed)
Spoke w/ [name] [name] [name] and scheduled f/u appt. for [name] [name] on 11/10/2013 @ 9:00 AM.  OK'd per RR.

## 2013-11-10 ENCOUNTER — Encounter: Payer: Self-pay | Admitting: Neurology

## 2013-11-10 ENCOUNTER — Ambulatory Visit (INDEPENDENT_AMBULATORY_CARE_PROVIDER_SITE_OTHER): Payer: Medicare Other | Admitting: Neurology

## 2013-11-10 VITALS — BP 128/68 | HR 69 | Ht 63.0 in | Wt 166.8 lb

## 2013-11-10 DIAGNOSIS — G40209 Localization-related (focal) (partial) symptomatic epilepsy and epileptic syndromes with complex partial seizures, not intractable, without status epilepticus: Secondary | ICD-10-CM

## 2013-11-10 DIAGNOSIS — G44209 Tension-type headache, unspecified, not intractable: Secondary | ICD-10-CM

## 2013-11-10 MED ORDER — LEVETIRACETAM 250 MG PO TABS: 250.0000 mg | ORAL_TABLET | Freq: Two times a day (BID) | ORAL | Status: DC

## 2013-11-10 NOTE — Progress Notes (Signed)
[facility]  PATIENT: [name] [name] DOB: [id number]   HISTORY FROM: patient, chart REASON FOR VISIT: follow up  HISTORY OF PRESENT ILLNESS:  [name] is a 80 year old right-handed Caucasian female with a history of persistent right frontal and parietal superficial cortical lesion of undetermined etiology despite brain biopsy. She had a questionable partial seizure at the time of the biopsy. She was placed on Keppra but has had difficulty with tolerance and has been excessively sleepy during the day with his medication dose was increased. She is currently taking only 250 mg per day. She admits to poor sleep. She has difficulty going to sleep and staying asleep. She is a long-standing problem with dizziness and sense of imbalance. She's been told to be careful when changing positions because of orthostatic hypotension. She has trouble walking, needs to have a cane or person assistance to walk. She says she has been told she has some vertigo in the past. New neurologic complaints. No seizure activity since last seen. ROS.  UPDATE 10/19/2012: Patient returns for followup since last visit on 10/20/2011. No reported changes since last visit. No new neurologic complaints. No seizure activity since last seen. UPDATE 11/10/2013 : She returns for followup after last visit a year ago. She continues to do well without recurrent seizures now for nearly 5 years. She is taking Keppra 250 mg orally at night. She has a new complaint of mild headaches that she is having once or twice per week. She describes this mainly in the occipital region and occasionally over the vertex does headache pressure like in quality. She takes one or 2 Tylenol tablets which works quite well. She denies significant neck pain or radicular pain or arthritis. She has no other new complaints. REVIEW OF SYSTEMS: Full 14 system review of systems performed and notable only for: Headache only and all the systems negative       ALLERGIES: Allergies  Allergen Reactions  . Adhesive [Tape] Other (See Comments)    Tears patients skin off.   . Latex Itching  . Penicillins Hives and Itching  . Sulfa Antibiotics Hives and Itching    HOME MEDICATIONS: Outpatient Prescriptions Prior to Visit  Medication Sig Dispense Refill  . ALPRAZolam (XANAX) 0.5 MG tablet Take 0.125-0.5 mg by mouth daily as needed. For nerves      . amLODipine (NORVASC) 5 MG tablet Take 5 mg by mouth daily.      Marland Kitchen aspirin EC 81 MG tablet Take 81 mg by mouth at bedtime.       . cephALEXin (KEFLEX) 500 MG capsule 2 caps po bid x 7 days  28 capsule  0  . CRESTOR 20 MG tablet Take 1 tablet by mouth at bedtime.       . docusate sodium (COLACE) 100 MG capsule Take 100 mg by mouth 2 (two) times daily.       . fentaNYL (DURAGESIC - DOSED MCG/HR) 12 MCG/HR Place 12 mcg onto the skin every 3 (three) days.      Marland Kitchen gabapentin (NEURONTIN) 100 MG capsule Take 300 mg by mouth at bedtime.      Marland Kitchen HYDROcodone-acetaminophen (NORCO) 10-325 MG per tablet Take 1 tablet by mouth every 6 (six) hours as needed.      Marland Kitchen l-methylfolate-B6-B12 (METANX) 3-35-2 MG TABS Take 1 tablet by mouth at bedtime.  30 tablet  0  . Multiple Vitamins-Minerals (ICAPS PO) Take 1 tablet by mouth at bedtime.      Marland Kitchen  nitroGLYCERIN (NITROSTAT) 0.4 MG SL tablet Place 0.4 mg under the tongue every 5 (five) minutes as needed for chest pain.      Marland Kitchen oxyCODONE-acetaminophen (PERCOCET) 5-325 MG per tablet Take 2 tablets by mouth every 6 (six) hours as needed for severe pain.  20 tablet  0  . temazepam (RESTORIL) 15 MG capsule Take 15 mg by mouth at bedtime as needed for sleep.       . VOLTAREN 1 % GEL Apply 2 g topically daily as needed (Muscle Pain).       Marland Kitchen levETIRAcetam (KEPPRA) 250 MG tablet TAKE ONE TABLET BY MOUTH AT BEDTIME  30 tablet  0   No facility-administered medications prior to visit.    PAST MEDICAL HISTORY: Past Medical History  Diagnosis Date  . Mixed hyperlipidemia   .  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 Essential hypertension, benign   . Postsurgical aortocoronary bypass status   . Bladder cancer   . Dizziness   . Obesity   . Back pain   . CAD (coronary artery disease)   . Seizure     PAST SURGICAL HISTORY: Past Surgical History  Procedure Laterality Date  . Cataract extraction    . Coronary artery bypass graft    . Iridectomy    . Nephrectomy  2000    FAMILY HISTORY: Family History  Problem Relation Age of Onset  . Stroke    . Coronary artery disease    . Cancer    . Diabetes    . Colon cancer Mother   . Heart attack Father   . Diabetes Father     SOCIAL HISTORY: History   Social History  . Marital Status: Widowed    Spouse Name: N/A    Number of Children: 3  . Years of Education: N/A   Occupational History  .      n/a   Social History Main Topics  . Smoking status: Never Smoker   . Smokeless tobacco: Never Used  . Alcohol Use: No  . Drug Use: No  . Sexual Activity: No   Other Topics Concern  . Not on file   Social History Narrative   Patient lives at home with his son and daughter in law.   Caffeine Use: 2 cups of caffeine daily.      PHYSICAL EXAM  Filed Vitals:   11/10/13 0849  BP: 128/68  Pulse: 69  Height: 5\' 3"  (1.6 m)  Weight: 75.66 kg (166 lb 12.8 oz)   Body mass index is 29.55 kg/(m^2).  Generalized: well-developed, well-nourished elderly Caucasian lady, seated, and no evidence of stress   Neck: Supple, no carotid bruits   Cardiac: Regular rate rhythm, no murmur   Pulmonary: Clear to auscultation bilaterally   Musculoskeletal: No deformity   Neurological examination   Mentation: Alert oriented to time, place, history taking, language fluent, and causual conversation  Cranial nerve II-XII: Pupils were equal round reactive to light extraocular movements were full, visual field were full on confrontational test. facial sensation and strength were normal. hearing was intact to finger rubbing  bilaterally. Uvula tongue midline. head turning and shoulder shrug and were normal and symmetric.Tongue protrusion into cheek strength was normal. Neck flexion and extension normal. MOTOR: normal bulk and tone, full strength on the right, 4/5 on the left. Diminished grip on left. no pronator drift SENSORY: normal and symmetric to light touch, pinprick, temperature, vibration and proprioception COORDINATION: finger-nose-finger, heel-to-shin bilaterally, rapid alternating movement is normal on the  right, slower on left REFLEXES: 1+ and symmetric GAIT/STATION: Rises from chair with use of hands. Stance is broad-based. Hemiparetic gait. ambulates with cane.   ASSESSMENT AND PLAN Ms. Romie Crissey is a 80 year old right-handed Caucasian female with a history of persistent right frontal and parietal superficial cortical lesion of undetermined etiology despite brain biopsy. She had a questionable partial seizure at the time of the biopsy. She is on low-dose Keppra, 250 mg per day.  EEG 06/21/2008 revealed right central epileptiform discharges. New onset posterior headaches likely muscle tension headaches PLAN : I  had a long discussion with the patient with regards to her remote history of symptomatic seizures and good response to Keppra. She however is taking Keppra only once a day I counseled her to increase it to twice a day to get 24 hour protection from the medication. She was given a new prescription for the same. She is also complaining of mild posterior headaches which seem like tension headaches. I advised her to do daily neck stretching exercises and continue to use tylenol as needed but limit to not more than 2-3 days per week. She was advised to return for followup in a year or call earlier if necessary.      Antony Contras, MD  11/10/2013, 9:28 AM  [facility] [address], [place], [place] [address] [phone]

## 2013-11-10 NOTE — Patient Instructions (Addendum)
I  had a long discussion with the patient with regards to her remote history of symptomatic seizures and good response to Keppra. She however is taking Keppra only once a day I counseled her to increase it to twice a day to get 24 hour protection from the medication. She was given a new prescription for the same. She is also complaining of mild posterior headaches which seem like tension headaches. I advised her to do daily neck stretching exercises and continue to use tylenol as needed but limit to not more than 2-3 days per week. She was advised to return for followup in a year or call earlier if necessary.  Tension Headache A tension headache is a feeling of pain, pressure, or aching often felt over the front and sides of the head. The pain can be dull or can feel tight (constricting). It is the most common type of headache. Tension headaches are not normally associated with nausea or vomiting and do not get worse with physical activity. Tension headaches can last 30 minutes to several days.  CAUSES  The exact cause is not known, but it may be caused by chemicals and hormones in the brain that lead to pain. Tension headaches often begin after stress, anxiety, or depression. Other triggers may include:  Alcohol.  Caffeine (too much or withdrawal).  Respiratory infections (colds, flu, sinus infections).  Dental problems or teeth clenching.  Fatigue.  Holding your head and neck in one position too long while using a computer. SYMPTOMS   Pressure around the head.   Dull, aching head pain.   Pain felt over the front and sides of the head.   Tenderness in the muscles of the head, neck, and shoulders. DIAGNOSIS  A tension headache is often diagnosed based on:   Symptoms.   Physical examination.   A CT scan or MRI of your head. These tests may be ordered if symptoms are severe or unusual. TREATMENT  Medicines may be given to help relieve symptoms.  HOME CARE INSTRUCTIONS   Only  take over-the-counter or prescription medicines for pain or discomfort as directed by your caregiver.   Lie down in a dark, quiet room when you have a headache.   Keep a journal to find out what may be triggering your headaches. For example, write down:  What you eat and drink.  How much sleep you get.  Any change to your diet or medicines.  Try massage or other relaxation techniques.   Ice packs or heat applied to the head and neck can be used. Use these 3 to 4 times per day for 15 to 20 minutes each time, or as needed.   Limit stress.   Sit up straight, and do not tense your muscles.   Quit smoking if you smoke.  Limit alcohol use.  Decrease the amount of caffeine you drink, or stop drinking caffeine.  Eat and exercise regularly.  Get 7 to 9 hours of sleep, or as recommended by your caregiver.  Avoid excessive use of pain medicine as recurrent headaches can occur.  SEEK MEDICAL CARE IF:   You have problems with the medicines you were prescribed.  Your medicines do not work.  You have a change from the usual headache.  You have nausea or vomiting. SEEK IMMEDIATE MEDICAL CARE IF:   Your headache becomes severe.  You have a fever.  You have a stiff neck.  You have loss of vision.  You have muscular weakness or loss of muscle  control.  You lose your balance or have trouble walking.  You feel faint or pass out.  You have severe symptoms that are different from your first symptoms. MAKE SURE YOU:   Understand these instructions.  Will watch your condition.  Will get help right away if you are not doing well or get worse. Document Released: 03/24/2005 Document Revised: 06/16/2011 Document Reviewed: 03/14/2011 [facility] Patient Information 2015 [place], [place]. This information is not intended to replace advice given to you by your health care provider. Make sure you discuss any questions you have with your health care provider.

## 2013-11-18 ENCOUNTER — Ambulatory Visit (HOSPITAL_COMMUNITY)
Admission: RE | Admit: 2013-11-18 | Discharge: 2013-11-18 | Disposition: A | Discharge status: Home / Self Care | Payer: Medicare Other | Source: Ambulatory Visit | Attending: Internal Medicine | Admitting: Internal Medicine

## 2013-11-18 DIAGNOSIS — Z1231 Encounter for screening mammogram for malignant neoplasm of breast: Secondary | ICD-10-CM | POA: Insufficient documentation

## 2014-02-06 ENCOUNTER — Emergency Department (HOSPITAL_COMMUNITY)
Admission: EM | Admit: 2014-02-06 | Discharge: 2014-02-06 | Disposition: A | Discharge status: Home / Self Care | Payer: Medicare Other | Attending: Emergency Medicine | Admitting: Emergency Medicine

## 2014-02-06 ENCOUNTER — Emergency Department (HOSPITAL_COMMUNITY): Payer: Medicare Other

## 2014-02-06 ENCOUNTER — Encounter (HOSPITAL_COMMUNITY): Payer: Self-pay | Admitting: *Deleted

## 2014-02-06 DIAGNOSIS — I10 Essential (primary) hypertension: Secondary | ICD-10-CM | POA: Insufficient documentation

## 2014-02-06 DIAGNOSIS — W010XXA Fall on same level from slipping, tripping and stumbling without subsequent striking against object, initial encounter: Secondary | ICD-10-CM | POA: Insufficient documentation

## 2014-02-06 DIAGNOSIS — T1490XA Injury, unspecified, initial encounter: Secondary | ICD-10-CM

## 2014-02-06 DIAGNOSIS — Z7982 Long term (current) use of aspirin: Secondary | ICD-10-CM | POA: Diagnosis not present

## 2014-02-06 DIAGNOSIS — Z91040 Latex allergy status: Secondary | ICD-10-CM | POA: Diagnosis not present

## 2014-02-06 DIAGNOSIS — I2510 Atherosclerotic heart disease of native coronary artery without angina pectoris: Secondary | ICD-10-CM | POA: Insufficient documentation

## 2014-02-06 DIAGNOSIS — Z79899 Other long term (current) drug therapy: Secondary | ICD-10-CM | POA: Insufficient documentation

## 2014-02-06 DIAGNOSIS — Z951 Presence of aortocoronary bypass graft: Secondary | ICD-10-CM | POA: Diagnosis not present

## 2014-02-06 DIAGNOSIS — Y9389 Activity, other specified: Secondary | ICD-10-CM | POA: Insufficient documentation

## 2014-02-06 DIAGNOSIS — Z880 Allergy status to penicillin: Secondary | ICD-10-CM | POA: Diagnosis not present

## 2014-02-06 DIAGNOSIS — S20211A Contusion of right front wall of thorax, initial encounter: Secondary | ICD-10-CM | POA: Insufficient documentation

## 2014-02-06 DIAGNOSIS — Y9289 Other specified places as the place of occurrence of the external cause: Secondary | ICD-10-CM | POA: Insufficient documentation

## 2014-02-06 DIAGNOSIS — Z8551 Personal history of malignant neoplasm of bladder: Secondary | ICD-10-CM | POA: Diagnosis not present

## 2014-02-06 DIAGNOSIS — S20301A Unspecified superficial injuries of right front wall of thorax, initial encounter: Secondary | ICD-10-CM | POA: Diagnosis present

## 2014-02-06 DIAGNOSIS — Z8669 Personal history of other diseases of the nervous system and sense organs: Secondary | ICD-10-CM | POA: Diagnosis not present

## 2014-02-06 DIAGNOSIS — R52 Pain, unspecified: Secondary | ICD-10-CM

## 2014-02-06 DIAGNOSIS — E669 Obesity, unspecified: Secondary | ICD-10-CM | POA: Insufficient documentation

## 2014-02-06 LAB — CBC WITH DIFFERENTIAL/PLATELET
BASOS ABS: 0 10*3/uL (ref 0.0–0.1)
BASOS PCT: 1 % (ref 0–1)
EOS PCT: 5 % (ref 0–5)
Eosinophils Absolute: 0.5 10*3/uL (ref 0.0–0.7)
HCT: 42 % (ref 36.0–46.0)
Hemoglobin: 13.3 g/dL (ref 12.0–15.0)
Lymphocytes Relative: 30 % (ref 12–46)
Lymphs Abs: 2.6 10*3/uL (ref 0.7–4.0)
MCH: 28.8 pg (ref 26.0–34.0)
MCHC: 31.7 g/dL (ref 30.0–36.0)
MCV: 90.9 fL (ref 78.0–100.0)
Monocytes Absolute: 0.8 10*3/uL (ref 0.1–1.0)
Monocytes Relative: 9 % (ref 3–12)
Neutro Abs: 4.8 10*3/uL (ref 1.7–7.7)
Neutrophils Relative %: 55 % (ref 43–77)
PLATELETS: 173 10*3/uL (ref 150–400)
RBC: 4.62 MIL/uL (ref 3.87–5.11)
RDW: 15.2 % (ref 11.5–15.5)
WBC: 8.8 10*3/uL (ref 4.0–10.5)

## 2014-02-06 LAB — COMPREHENSIVE METABOLIC PANEL
ALT: 16 U/L (ref 0–35)
AST: 29 U/L (ref 0–37)
Albumin: 3.7 g/dL (ref 3.5–5.2)
Alkaline Phosphatase: 70 U/L (ref 39–117)
Anion gap: 12 (ref 5–15)
BUN: 17 mg/dL (ref 6–23)
CALCIUM: 9.5 mg/dL (ref 8.4–10.5)
CO2: 29 mEq/L (ref 19–32)
Chloride: 101 mEq/L (ref 96–112)
Creatinine, Ser: 1.66 mg/dL — ABNORMAL HIGH (ref 0.50–1.10)
GFR calc Af Amer: 32 mL/min — ABNORMAL LOW (ref >90)
GFR calc non Af Amer: 27 mL/min — ABNORMAL LOW (ref >90)
Glucose, Bld: 116 mg/dL — ABNORMAL HIGH (ref 70–99)
Potassium: 4 mEq/L (ref 3.7–5.3)
SODIUM: 142 meq/L (ref 137–147)
TOTAL PROTEIN: 7.6 g/dL (ref 6.0–8.3)
Total Bilirubin: 0.6 mg/dL (ref 0.3–1.2)

## 2014-02-06 MED ORDER — HYDROMORPHONE HCL 1 MG/ML IJ SOLN
0.5000 mg | Freq: Once | INTRAMUSCULAR | Status: AC
  Administered 2014-02-06: 0.5 mg via INTRAVENOUS
  Filled 2014-02-06: qty 1

## 2014-02-06 MED ORDER — OXYCODONE-ACETAMINOPHEN 5-325 MG PO TABS: 1.0000 | ORAL_TABLET | Freq: Four times a day (QID) | ORAL | Status: DC | PRN

## 2014-02-06 NOTE — ED Provider Notes (Signed)
CSN: [id number]     Arrival date & time 02/06/14  1744 History  This chart was scribed for Maudry Diego, MD by Dellis Filbert, ED Scribe. The patient was seen in [place] and the patient's care was started at 8:09 PM.    Chief Complaint  Patient presents with  . Fall   Patient is a 80 y.o. female presenting with fall. The history is provided by the patient. No language interpreter was used.  Fall This is a new problem. The current episode started yesterday. The problem occurs rarely. The problem has not changed since onset.Associated symptoms include chest pain and abdominal pain. Pertinent negatives include no headaches. Nothing aggravates the symptoms. Nothing relieves the symptoms. She has tried nothing for the symptoms.    HPI Comments: [name] [name] is a 80 y.o. female who presents to the Emergency Department complaining of a abdominal and chest pain. PT states that yesterday morning she got up to use the bathroom and tripped over her cat. Pt notes the pain is interfering with her breathing. Pt has a hernia in her abdomen.   Pt got up to  Past Medical History  Diagnosis Date  . Mixed hyperlipidemia   . 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 Essential hypertension, benign   . Postsurgical aortocoronary bypass status   . Dizziness   . Obesity   . Back pain   . CAD (coronary artery disease)   . Seizure   . Bladder cancer    Past Surgical History  Procedure Laterality Date  . Cataract extraction    . Coronary artery bypass graft    . Iridectomy    . Nephrectomy  2000   Family History  Problem Relation Age of Onset  . Stroke    . Coronary artery disease    . Cancer    . Diabetes    . Colon cancer Mother   . Heart attack Father   . Diabetes Father    History  Substance Use Topics  . Smoking status: Never Smoker   . Smokeless tobacco: Never Used  . Alcohol Use: No   OB History    Gravida Para Term Preterm AB TAB SAB Ectopic Multiple Living   3 3 3       3       Review of Systems  Constitutional: Negative for appetite change and fatigue.  HENT: Negative for congestion, ear discharge and sinus pressure.   Eyes: Negative for discharge.  Respiratory: Negative for cough.   Cardiovascular: Positive for chest pain.  Gastrointestinal: Positive for abdominal pain. Negative for diarrhea.  Genitourinary: Negative for frequency and hematuria.  Musculoskeletal: Negative for back pain.  Skin: Negative for rash.  Neurological: Negative for seizures and headaches.  Psychiatric/Behavioral: Negative for hallucinations.    Allergies  Adhesive; Latex; Penicillins; and Sulfa antibiotics  Home Medications   Prior to Admission medications   Medication Sig Start Date End Date Taking? Authorizing Provider  ALPRAZolam Duanne Moron) 0.5 MG tablet Take 0.125-0.5 mg by mouth daily as needed. For nerves    Historical Provider, MD  amLODipine (NORVASC) 5 MG tablet Take 5 mg by mouth daily.    Historical Provider, MD  aspirin EC 81 MG tablet Take 81 mg by mouth at bedtime.     Historical Provider, MD  cephALEXin (KEFLEX) 500 MG capsule 2 caps po bid x 7 days 06/11/13   Babette Relic, MD  CRESTOR 20 MG tablet Take 1 tablet by mouth at bedtime.  01/06/11  Historical Provider, MD  docusate sodium (COLACE) 100 MG capsule Take 100 mg by mouth 2 (two) times daily.     Historical Provider, MD  fentaNYL (DURAGESIC - DOSED MCG/HR) 12 MCG/HR Place 12 mcg onto the skin every 3 (three) days.    Historical Provider, MD  gabapentin (NEURONTIN) 100 MG capsule Take 300 mg by mouth at bedtime. 04/14/13   Historical Provider, MD  HYDROcodone-acetaminophen (NORCO) 10-325 MG per tablet Take 1 tablet by mouth every 6 (six) hours as needed.    Historical Provider, MD  l-methylfolate-B6-B12 (METANX) 3-35-2 MG TABS Take 1 tablet by mouth at bedtime. 10/27/13   Antony Contras, MD  levETIRAcetam (KEPPRA) 250 MG tablet Take 1 tablet (250 mg total) by mouth 2 (two) times daily. 11/10/13   Antony Contras, MD   Multiple Vitamins-Minerals (ICAPS PO) Take 1 tablet by mouth at bedtime.    Historical Provider, MD  nitroGLYCERIN (NITROSTAT) 0.4 MG SL tablet Place 0.4 mg under the tongue every 5 (five) minutes as needed for chest pain.    Historical Provider, MD  oxyCODONE-acetaminophen (PERCOCET) 5-325 MG per tablet Take 2 tablets by mouth every 6 (six) hours as needed for severe pain. 06/11/13   Babette Relic, MD  temazepam (RESTORIL) 15 MG capsule Take 15 mg by mouth at bedtime as needed for sleep.     Historical Provider, MD  VOLTAREN 1 % GEL Apply 2 g topically daily as needed (Muscle Pain).  08/20/12   Historical Provider, MD   BP 152/65 mmHg  Pulse 74  Temp(Src) 98.6 F (37 C) (Oral)  Resp 18  Ht 5\' 2"  (1.575 m)  Wt 184 lb (83.462 kg)  BMI 33.65 kg/m2  SpO2 96% Physical Exam  Constitutional: She is oriented to person, place, and time. She appears well-developed.  HENT:  Head: Normocephalic.  Eyes: Conjunctivae and EOM are normal. No scleral icterus.  Neck: Neck supple. No thyromegaly present.  Cardiovascular: Normal rate and regular rhythm.  Exam reveals no gallop and no friction rub.   No murmur heard. Pulmonary/Chest: No stridor. She has no wheezes. She has no rales. She exhibits no tenderness.  Abdominal: She exhibits no distension. There is no tenderness. There is no rebound.  Moderate RUQ tenderness  Musculoskeletal: Normal range of motion. She exhibits no edema.  Mild tenderness to right chest.  Lymphadenopathy:    She has no cervical adenopathy.  Neurological: She is oriented to person, place, and time. She exhibits normal muscle tone. Coordination normal.  Skin: No rash noted. No erythema.  Psychiatric: She has a normal mood and affect. Her behavior is normal.    ED Course  Procedures  DIAGNOSTIC STUDIES: Oxygen Saturation is 96% on room air, adequate by my interpretation.    COORDINATION OF CARE: 8:12 PM Discussed treatment plan with pt at bedside and pt agreed to  plan.   Labs Review Labs Reviewed - No data to display  Imaging Review Dg Ribs Unilateral W/chest Right  02/06/2014   CLINICAL DATA:  Right anterior rib pain, most severe beneath the right breast, after tripping and falling over her cat at home yesterday.  EXAM: RIGHT RIBS AND CHEST - 3+ VIEW  COMPARISON:  Chest radiographs dated 11/02/2011.  FINDINGS: Borderline enlarged cardiac silhouette with a mild decrease in size. Decreased prominence of the pulmonary vasculature and interstitial markings. Mild residual interstitial prominence. A small calcified granuloma at the medial left lung apex is unchanged. Median sternotomy wires are again demonstrated. Mildly displaced right anterolateral fifth, sixth,  seventh, eighth and ninth rib fractures. No pneumothorax. Upper abdominal surgical clips. Thoracolumbar spine compression deformity with kyphoplasty material. Diffuse osteopenia. Atheromatous arterial calcifications.  IMPRESSION: 1. Mildly displaced right anterolateral fifth through ninth rib fractures. 2. No pneumothorax. 3. Mild chronic interstitial lung disease.   Electronically Signed   By: Enrique Sack M.D.   On: 02/06/2014 18:41     EKG Interpretation None     I spoke with radiology and it was felt all the rib fx were old and seen on previous ct MDM   Final diagnoses:  Injury    The chart was scribed for me under my direct supervision.  I personally performed the history, physical, and medical decision making and all procedures in the evaluation of this patient.Maudry Diego, MD 02/06/14 2312

## 2014-02-06 NOTE — Discharge Instructions (Signed)
Follow up with your md this week. °

## 2014-02-06 NOTE — ED Notes (Signed)
Tripped over a cat   , Pain rt ribs, rt breast,

## 2014-02-06 NOTE — ED Notes (Signed)
Family at bedside. Nurse in room to start IV.

## 2014-04-24 DIAGNOSIS — H3532 Exudative age-related macular degeneration: Secondary | ICD-10-CM | POA: Diagnosis not present

## 2014-04-24 DIAGNOSIS — H3531 Nonexudative age-related macular degeneration: Secondary | ICD-10-CM | POA: Diagnosis not present

## 2014-05-03 DIAGNOSIS — R300 Dysuria: Secondary | ICD-10-CM | POA: Diagnosis not present

## 2014-05-03 DIAGNOSIS — R319 Hematuria, unspecified: Secondary | ICD-10-CM | POA: Diagnosis not present

## 2014-05-16 DIAGNOSIS — M2550 Pain in unspecified joint: Secondary | ICD-10-CM | POA: Diagnosis not present

## 2014-05-16 DIAGNOSIS — R0789 Other chest pain: Secondary | ICD-10-CM | POA: Diagnosis not present

## 2014-05-16 DIAGNOSIS — G4700 Insomnia, unspecified: Secondary | ICD-10-CM | POA: Diagnosis not present

## 2014-05-16 DIAGNOSIS — N390 Urinary tract infection, site not specified: Secondary | ICD-10-CM | POA: Diagnosis not present

## 2014-05-16 DIAGNOSIS — M5430 Sciatica, unspecified side: Secondary | ICD-10-CM | POA: Diagnosis not present

## 2014-05-16 DIAGNOSIS — R319 Hematuria, unspecified: Secondary | ICD-10-CM | POA: Diagnosis not present

## 2014-06-09 DIAGNOSIS — R300 Dysuria: Secondary | ICD-10-CM | POA: Diagnosis not present

## 2014-06-09 DIAGNOSIS — R319 Hematuria, unspecified: Secondary | ICD-10-CM | POA: Diagnosis not present

## 2014-06-20 DIAGNOSIS — R319 Hematuria, unspecified: Secondary | ICD-10-CM | POA: Diagnosis not present

## 2014-06-20 DIAGNOSIS — R300 Dysuria: Secondary | ICD-10-CM | POA: Diagnosis not present

## 2014-06-20 DIAGNOSIS — N390 Urinary tract infection, site not specified: Secondary | ICD-10-CM | POA: Diagnosis not present

## 2014-06-26 DIAGNOSIS — R339 Retention of urine, unspecified: Secondary | ICD-10-CM | POA: Diagnosis not present

## 2014-06-26 DIAGNOSIS — N3020 Other chronic cystitis without hematuria: Secondary | ICD-10-CM | POA: Diagnosis not present

## 2014-06-26 DIAGNOSIS — I10 Essential (primary) hypertension: Secondary | ICD-10-CM | POA: Diagnosis not present

## 2014-07-03 DIAGNOSIS — N390 Urinary tract infection, site not specified: Secondary | ICD-10-CM | POA: Diagnosis not present

## 2014-07-03 DIAGNOSIS — Z6832 Body mass index (BMI) 32.0-32.9, adult: Secondary | ICD-10-CM | POA: Diagnosis not present

## 2014-07-03 DIAGNOSIS — N183 Chronic kidney disease, stage 3 (moderate): Secondary | ICD-10-CM | POA: Diagnosis not present

## 2014-07-03 DIAGNOSIS — G4700 Insomnia, unspecified: Secondary | ICD-10-CM | POA: Diagnosis not present

## 2014-07-07 DIAGNOSIS — R339 Retention of urine, unspecified: Secondary | ICD-10-CM | POA: Diagnosis not present

## 2014-07-07 DIAGNOSIS — N3020 Other chronic cystitis without hematuria: Secondary | ICD-10-CM | POA: Diagnosis not present

## 2014-07-07 DIAGNOSIS — Z8551 Personal history of malignant neoplasm of bladder: Secondary | ICD-10-CM | POA: Diagnosis not present

## 2014-07-25 DIAGNOSIS — N3281 Overactive bladder: Secondary | ICD-10-CM | POA: Diagnosis not present

## 2014-07-25 DIAGNOSIS — N3020 Other chronic cystitis without hematuria: Secondary | ICD-10-CM | POA: Diagnosis not present

## 2014-08-14 DIAGNOSIS — R1030 Lower abdominal pain, unspecified: Secondary | ICD-10-CM | POA: Diagnosis not present

## 2014-08-14 DIAGNOSIS — N390 Urinary tract infection, site not specified: Secondary | ICD-10-CM | POA: Diagnosis not present

## 2014-08-14 DIAGNOSIS — N184 Chronic kidney disease, stage 4 (severe): Secondary | ICD-10-CM | POA: Diagnosis not present

## 2014-08-24 DIAGNOSIS — H35363 Drusen (degenerative) of macula, bilateral: Secondary | ICD-10-CM | POA: Diagnosis not present

## 2014-08-24 DIAGNOSIS — H3532 Exudative age-related macular degeneration: Secondary | ICD-10-CM | POA: Diagnosis not present

## 2014-08-31 DIAGNOSIS — R1030 Lower abdominal pain, unspecified: Secondary | ICD-10-CM | POA: Diagnosis not present

## 2014-08-31 DIAGNOSIS — B372 Candidiasis of skin and nail: Secondary | ICD-10-CM | POA: Diagnosis not present

## 2014-09-07 DIAGNOSIS — R339 Retention of urine, unspecified: Secondary | ICD-10-CM | POA: Diagnosis not present

## 2014-09-07 DIAGNOSIS — N3281 Overactive bladder: Secondary | ICD-10-CM | POA: Diagnosis not present

## 2014-09-07 DIAGNOSIS — R3912 Poor urinary stream: Secondary | ICD-10-CM | POA: Diagnosis not present

## 2014-09-07 DIAGNOSIS — R3913 Splitting of urinary stream: Secondary | ICD-10-CM | POA: Diagnosis not present

## 2014-10-26 DIAGNOSIS — B379 Candidiasis, unspecified: Secondary | ICD-10-CM | POA: Diagnosis not present

## 2014-10-26 DIAGNOSIS — M7061 Trochanteric bursitis, right hip: Secondary | ICD-10-CM | POA: Diagnosis not present

## 2014-11-20 ENCOUNTER — Other Ambulatory Visit: Payer: Self-pay | Admitting: Neurology

## 2014-11-20 NOTE — Telephone Encounter (Signed)
Patient has appt scheduled

## 2014-11-21 ENCOUNTER — Ambulatory Visit: Payer: Medicare Other | Admitting: Neurology

## 2014-12-08 ENCOUNTER — Ambulatory Visit (INDEPENDENT_AMBULATORY_CARE_PROVIDER_SITE_OTHER): Payer: Medicare Other | Admitting: Urology

## 2014-12-08 DIAGNOSIS — R339 Retention of urine, unspecified: Secondary | ICD-10-CM | POA: Diagnosis not present

## 2014-12-08 DIAGNOSIS — N3020 Other chronic cystitis without hematuria: Secondary | ICD-10-CM | POA: Diagnosis not present

## 2014-12-08 DIAGNOSIS — N3946 Mixed incontinence: Secondary | ICD-10-CM | POA: Diagnosis not present

## 2014-12-20 ENCOUNTER — Ambulatory Visit (INDEPENDENT_AMBULATORY_CARE_PROVIDER_SITE_OTHER): Payer: Medicare Other | Admitting: Neurology

## 2014-12-20 ENCOUNTER — Encounter: Payer: Self-pay | Admitting: Neurology

## 2014-12-20 VITALS — BP 131/76 | HR 68 | Ht 62.0 in | Wt 176.2 lb

## 2014-12-20 DIAGNOSIS — R569 Unspecified convulsions: Secondary | ICD-10-CM

## 2014-12-20 MED ORDER — LEVETIRACETAM 250 MG PO TABS: 250.0000 mg | ORAL_TABLET | Freq: Two times a day (BID) | ORAL | Status: DC

## 2014-12-20 NOTE — Progress Notes (Signed)
[facility]  PATIENT: AVENELL FOOSE DOB: [id number]   HISTORY FROM: patient, chart REASON FOR VISIT: follow up  HISTORY OF PRESENT ILLNESS:  Ms. Kacey Kamada is a 81 year old right-handed Caucasian female with a history of persistent right frontal and parietal superficial cortical lesion of undetermined etiology despite brain biopsy. She had a questionable partial seizure at the time of the biopsy. She was placed on Keppra but has had difficulty with tolerance and has been excessively sleepy during the day with his medication dose was increased. She is currently taking only 250 mg per day. She admits to poor sleep. She has difficulty going to sleep and staying asleep. She is a long-standing problem with dizziness and sense of imbalance. She's been told to be careful when changing positions because of orthostatic hypotension. She has trouble walking, needs to have a cane or person assistance to walk. She says she has been told she has some vertigo in the past. New neurologic complaints. No seizure activity since last seen. ROS.  UPDATE 10/19/2012: Patient returns for followup since last visit on 10/20/2011. No reported changes since last visit. No new neurologic complaints. No seizure activity since last seen. UPDATE 11/10/2013 : She returns for followup after last visit a year ago. She continues to do well without recurrent seizures now for nearly 5 years. She is taking Keppra 250 mg orally at night. She has a new complaint of mild headaches that she is having once or twice per week. She describes this mainly in the occipital region and occasionally over the vertex does headache pressure like in quality. She takes one or 2 Tylenol tablets which works quite well. She denies significant neck pain or radicular pain or arthritis. She has no other new complaints. Update 9/14/206 :SShe has recurrent UTI`s .he returns for f/u after last visit 1 year ago accompanied by daughter.She continues to  do well without recurrent seizures. She was unable to get keppra Xr from her insurance hence has been taking keppra 250 mg twice daily instead and tolerating it. She lives with son and daughter in law and is independent in ADLs but uses cane for walk and has to be careful with her balance and had 1 fall 1 year ago. But none since then. REVIEW OF SYSTEMS: Full 14 system review of systems performed and notable only for: faitgue,blurred vision,shortness of breath,,abdominal pain,incontinence of bladder,frequency of urination,headache,aching muscles,cramps,depression and all the systems negative     ALLERGIES: Allergies  Allergen Reactions  . Adhesive [Tape] Other (See Comments)    Tears patients skin off.   [name] [Cephalexin] Nausea And Vomiting  . Latex Itching  . Penicillins Hives and Itching  . Sulfa Antibiotics Hives and Itching    HOME MEDICATIONS: Outpatient Prescriptions Prior to Visit  Medication Sig Dispense Refill  . ALPRAZolam (XANAX) 0.5 MG tablet Take 0.125-0.5 mg by mouth daily as needed for anxiety. For nerves    . amLODipine (NORVASC) 5 MG tablet Take 5 mg by mouth daily.    Marland Kitchen aspirin EC 81 MG tablet Take 81 mg by mouth at bedtime.     . CRESTOR 20 MG tablet Take 1 tablet by mouth at bedtime.     . gabapentin (NEURONTIN) 100 MG capsule Take 300 mg by mouth 2 (two) times daily.     Marland Kitchen HYDROcodone-acetaminophen (NORCO) 10-325 MG per tablet Take 1 tablet by mouth every 6 (six) hours as needed for moderate pain or severe pain.     Marland Kitchen  l-methylfolate-B6-B12 (METANX) 3-35-2 MG TABS Take 1 tablet by mouth at bedtime. 30 tablet 0  . Multiple Vitamins-Minerals (ICAPS PO) Take 1 tablet by mouth at bedtime.    . nitroGLYCERIN (NITROSTAT) 0.4 MG SL tablet Place 0.4 mg under the tongue every 5 (five) minutes as needed for chest pain.    . NON FORMULARY 1-2 application by Pump Prime route 3 (three) times daily. COMPOUND CREAM: DIC/BAC/GAB/LID/PR/MEN    . VOLTAREN 1 % GEL Apply 2 g  topically daily as needed (Muscle Pain).     . BELSOMRA 10 MG TABS Take 10 mg by mouth at bedtime.    . cephALEXin (KEFLEX) 500 MG capsule 2 caps po bid x 7 days (Patient not taking: Reported on 12/20/2014) 28 capsule 0  . docusate sodium (COLACE) 100 MG capsule Take 100 mg by mouth 2 (two) times daily.     . fentaNYL (DURAGESIC - DOSED MCG/HR) 12 MCG/HR Place 12 mcg onto the skin every 3 (three) days.    Marland Kitchen levETIRAcetam (KEPPRA) 250 MG tablet Take 1 tablet (250 mg total) by mouth 2 (two) times daily. 180 tablet 0  . oxyCODONE-acetaminophen (PERCOCET/ROXICET) 5-325 MG per tablet Take 1 tablet by mouth every 6 (six) hours as needed. (Patient not taking: Reported on 12/20/2014) 20 tablet 0  . temazepam (RESTORIL) 15 MG capsule Take 15 mg by mouth at bedtime as needed for sleep.      No facility-administered medications prior to visit.    PAST MEDICAL HISTORY: Past Medical History  Diagnosis Date  . Mixed hyperlipidemia   . 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 Essential hypertension, benign   . Postsurgical aortocoronary bypass status   . Dizziness   . Obesity   . Back pain   . CAD (coronary artery disease)   . Seizure   . Bladder cancer     PAST SURGICAL HISTORY: Past Surgical History  Procedure Laterality Date  . Cataract extraction    . Coronary artery bypass graft    . Iridectomy    . Nephrectomy  2000    FAMILY HISTORY: Family History  Problem Relation Age of Onset  . Stroke    . Coronary artery disease    . Cancer    . Diabetes    . Colon cancer Mother   . Heart attack Father   . Diabetes Father     SOCIAL HISTORY: Social History   Social History  . Marital Status: Widowed    Spouse Name: N/A  . Number of Children: 3  . Years of Education: N/A   Occupational History  .      n/a   Social History Main Topics  . Smoking status: Never Smoker   . Smokeless tobacco: Never Used  . Alcohol Use: No  . Drug Use: No  . Sexual Activity: No   Other Topics Concern  .  Not on file   Social History Narrative   Patient lives at home with his son and daughter in law.   Caffeine Use: 2 cups of caffeine daily.      PHYSICAL EXAM  Filed Vitals:   12/20/14 1507  BP: 131/76  Pulse: 68  Height: 5\' 2"  (1.575 m)  Weight: 176 lb 3.2 oz (79.924 kg)   Body mass index is 32.22 kg/(m^2).  Generalized: frail elderly Caucasian lady, seated, and no evidence of stress   Neck: Supple, no carotid bruits   Cardiac: Regular rate rhythm, no murmur   Pulmonary: Clear to auscultation bilaterally  large midline sternal lipoma  Musculoskeletal: No deformity   Neurological examination   Mentation: Alert oriented to time, place, history taking, language fluent, and causual conversation  Cranial nerve II-XII: Pupils were equal round reactive to light extraocular movements were full, visual field were full on confrontational test. facial sensation and strength were normal. hearing was intact to finger rubbing bilaterally. Uvula tongue midline. head turning and shoulder shrug and were normal and symmetric.Tongue protrusion into cheek strength was normal. Neck flexion and extension normal. MOTOR: normal bulk and tone, full strength on the right, 4/5 on the left. Diminished grip on left. no pronator drift SENSORY: normal and symmetric to light touch, pinprick, temperature, vibration and proprioception COORDINATION: finger-nose-finger, heel-to-shin bilaterally, rapid alternating movement is normal on the right, slower on left REFLEXES: 1+ and symmetric GAIT/STATION: Rises from chair with use of hands. Stance is broad-based. Hemiparetic gait. ambulates with cane.   ASSESSMENT AND PLAN Ms. Chalise Ryskamp is a 81 year old right-handed Caucasian female with a history of persistent right frontal and parietal superficial cortical lesion of undetermined etiology despite brain biopsy. She had a questionable partial seizure at the time of the biopsy. She is on low-dose Keppra, 250 mg per  day.  EEG 06/21/2008 revealed right central epileptiform discharges. She remains stable without seizures PLAN :I had a long discussion with the patient and daughter-in-law with regards to her remote seizure and abnormal brain MRI scan, discussed risk for seizure recurrence, medication side effects and answered questions. She was given a refill for Keppra for a year. We also talked about fall risk as mentioned and precautions. She was advised to return for follow-up in one year or call earlier if necessary.    Antony Contras, MD  12/20/2014, 8:00 PM  [facility] [address], [place], [place] [address] [phone]

## 2014-12-20 NOTE — Patient Instructions (Signed)
I had a long discussion with the patient and daughter-in-law with regards to her remote seizure and abnormal brain MRI scan, discussed risk for seizure recurrence, medication side effects and answered questions. She was given a refill for Keppra for a year. We also talked about fall risk as mentioned and precautions. She was advised to return for follow-up in one year or call earlier if necessary. Fall Prevention and Home Safety Falls cause injuries and can affect all age groups. It is possible to use preventive measures to significantly decrease the likelihood of falls. There are many simple measures which can make your home safer and prevent falls. OUTDOORS  Repair cracks and edges of walkways and driveways.  Remove high doorway thresholds.  Trim shrubbery on the main path into your home.  Have good outside lighting.  Clear walkways of tools, rocks, debris, and clutter.  Check that handrails are not broken and are securely fastened. Both sides of steps should have handrails.  Have leaves, snow, and ice cleared regularly.  Use sand or salt on walkways during winter months.  In the garage, clean up grease or oil spills. BATHROOM  Install night lights.  Install grab bars by the toilet and in the tub and shower.  Use non-skid mats or decals in the tub or shower.  Place a plastic non-slip stool in the shower to sit on, if needed.  Keep floors dry and clean up all water on the floor immediately.  Remove soap buildup in the tub or shower on a regular basis.  Secure bath mats with non-slip, double-sided rug tape.  Remove throw rugs and tripping hazards from the floors. BEDROOMS  Install night lights.  Make sure a bedside light is easy to reach.  Do not use oversized bedding.  Keep a telephone by your bedside.  Have a firm chair with side arms to use for getting dressed.  Remove throw rugs and tripping hazards from the floor. KITCHEN  Keep handles on pots and pans  turned toward the center of the stove. Use back burners when possible.  Clean up spills quickly and allow time for drying.  Avoid walking on wet floors.  Avoid hot utensils and knives.  Position shelves so they are not too high or low.  Place commonly used objects within easy reach.  If necessary, use a sturdy step stool with a grab bar when reaching.  Keep electrical cables out of the way.  Do not use floor polish or wax that makes floors slippery. If you must use wax, use non-skid floor wax.  Remove throw rugs and tripping hazards from the floor. STAIRWAYS  Never leave objects on stairs.  Place handrails on both sides of stairways and use them. Fix any loose handrails. Make sure handrails on both sides of the stairways are as long as the stairs.  Check carpeting to make sure it is firmly attached along stairs. Make repairs to worn or loose carpet promptly.  Avoid placing throw rugs at the top or bottom of stairways, or properly secure the rug with carpet tape to prevent slippage. Get rid of throw rugs, if possible.  Have an electrician put in a light switch at the top and bottom of the stairs. OTHER FALL PREVENTION TIPS  Wear low-heel or rubber-soled shoes that are supportive and fit well. Wear closed toe shoes.  When using a stepladder, make sure it is fully opened and both spreaders are firmly locked. Do not climb a closed stepladder.  Add color or contrast  paint or tape to grab bars and handrails in your home. Place contrasting color strips on first and last steps.  Learn and use mobility aids as needed. Install an electrical emergency response system.  Turn on lights to avoid dark areas. Replace light bulbs that burn out immediately. Get light switches that glow.  Arrange furniture to create clear pathways. Keep furniture in the same place.  Firmly attach carpet with non-skid or double-sided tape.  Eliminate uneven floor surfaces.  Select a carpet pattern that  does not visually hide the edge of steps.  Be aware of all pets. OTHER HOME SAFETY TIPS  Set the water temperature for 120 F (48.8 C).  Keep emergency numbers on or near the telephone.  Keep smoke detectors on every level of the home and near sleeping areas. Document Released: 03/14/2002 Document Revised: 09/23/2011 Document Reviewed: 06/13/2011 [facility] Patient Information 2015 [place], [place]. This information is not intended to replace advice given to you by your health care provider. Make sure you discuss any questions you have with your health care provider.

## 2015-01-22 DIAGNOSIS — Z23 Encounter for immunization: Secondary | ICD-10-CM | POA: Diagnosis not present

## 2015-01-26 DIAGNOSIS — R319 Hematuria, unspecified: Secondary | ICD-10-CM | POA: Diagnosis not present

## 2015-02-13 DIAGNOSIS — M6281 Muscle weakness (generalized): Secondary | ICD-10-CM | POA: Diagnosis not present

## 2015-02-13 DIAGNOSIS — M7061 Trochanteric bursitis, right hip: Secondary | ICD-10-CM | POA: Diagnosis not present

## 2015-02-22 ENCOUNTER — Other Ambulatory Visit: Payer: Self-pay | Admitting: Neurology

## 2015-04-09 DIAGNOSIS — G894 Chronic pain syndrome: Secondary | ICD-10-CM | POA: Diagnosis not present

## 2015-04-09 DIAGNOSIS — B309 Viral conjunctivitis, unspecified: Secondary | ICD-10-CM | POA: Diagnosis not present

## 2015-04-09 DIAGNOSIS — N390 Urinary tract infection, site not specified: Secondary | ICD-10-CM | POA: Diagnosis not present

## 2015-05-23 DIAGNOSIS — N390 Urinary tract infection, site not specified: Secondary | ICD-10-CM | POA: Diagnosis not present

## 2015-05-25 DIAGNOSIS — M25551 Pain in right hip: Secondary | ICD-10-CM | POA: Diagnosis not present

## 2015-05-31 DIAGNOSIS — H353222 Exudative age-related macular degeneration, left eye, with inactive choroidal neovascularization: Secondary | ICD-10-CM | POA: Diagnosis not present

## 2015-05-31 DIAGNOSIS — H35363 Drusen (degenerative) of macula, bilateral: Secondary | ICD-10-CM | POA: Diagnosis not present

## 2015-05-31 DIAGNOSIS — H353134 Nonexudative age-related macular degeneration, bilateral, advanced atrophic with subfoveal involvement: Secondary | ICD-10-CM | POA: Diagnosis not present

## 2015-05-31 DIAGNOSIS — H353212 Exudative age-related macular degeneration, right eye, with inactive choroidal neovascularization: Secondary | ICD-10-CM | POA: Diagnosis not present

## 2015-06-01 ENCOUNTER — Ambulatory Visit (INDEPENDENT_AMBULATORY_CARE_PROVIDER_SITE_OTHER): Payer: Medicare Other | Admitting: Urology

## 2015-06-01 DIAGNOSIS — N323 Diverticulum of bladder: Secondary | ICD-10-CM | POA: Diagnosis not present

## 2015-06-01 DIAGNOSIS — N3020 Other chronic cystitis without hematuria: Secondary | ICD-10-CM | POA: Diagnosis not present

## 2015-06-01 DIAGNOSIS — N3946 Mixed incontinence: Secondary | ICD-10-CM

## 2015-06-01 DIAGNOSIS — Z8551 Personal history of malignant neoplasm of bladder: Secondary | ICD-10-CM | POA: Diagnosis not present

## 2015-06-21 DIAGNOSIS — R252 Cramp and spasm: Secondary | ICD-10-CM | POA: Diagnosis not present

## 2015-06-21 DIAGNOSIS — G4762 Sleep related leg cramps: Secondary | ICD-10-CM | POA: Diagnosis not present

## 2015-06-28 ENCOUNTER — Emergency Department (HOSPITAL_COMMUNITY)
Admission: EM | Admit: 2015-06-28 | Discharge: 2015-06-28 | Disposition: A | Discharge status: Home / Self Care | Payer: Medicare Other | Attending: Dermatology | Admitting: Dermatology

## 2015-06-28 ENCOUNTER — Encounter (HOSPITAL_COMMUNITY): Payer: Self-pay | Admitting: Emergency Medicine

## 2015-06-28 DIAGNOSIS — Z5321 Procedure and treatment not carried out due to patient leaving prior to being seen by health care provider: Secondary | ICD-10-CM | POA: Insufficient documentation

## 2015-06-28 DIAGNOSIS — M25551 Pain in right hip: Secondary | ICD-10-CM | POA: Insufficient documentation

## 2015-06-28 NOTE — ED Notes (Signed)
Pt reports right hip pain that she has had for years.  Pt pain is inc today.  Pt went to Dr. Nevada Crane last week because legs were cramping.  Pt was given magnesium and a cream to help with spasms.  Pt also having leg cramps today.

## 2015-07-12 DIAGNOSIS — I10 Essential (primary) hypertension: Secondary | ICD-10-CM | POA: Diagnosis not present

## 2015-07-12 DIAGNOSIS — R7301 Impaired fasting glucose: Secondary | ICD-10-CM | POA: Diagnosis not present

## 2015-07-16 DIAGNOSIS — M5430 Sciatica, unspecified side: Secondary | ICD-10-CM | POA: Diagnosis not present

## 2015-07-16 DIAGNOSIS — N184 Chronic kidney disease, stage 4 (severe): Secondary | ICD-10-CM | POA: Diagnosis not present

## 2015-07-16 DIAGNOSIS — D509 Iron deficiency anemia, unspecified: Secondary | ICD-10-CM | POA: Diagnosis not present

## 2015-07-16 DIAGNOSIS — I10 Essential (primary) hypertension: Secondary | ICD-10-CM | POA: Diagnosis not present

## 2015-07-16 DIAGNOSIS — E782 Mixed hyperlipidemia: Secondary | ICD-10-CM | POA: Diagnosis not present

## 2015-07-19 ENCOUNTER — Ambulatory Visit (HOSPITAL_COMMUNITY)
Admission: RE | Admit: 2015-07-19 | Discharge: 2015-07-19 | Disposition: A | Discharge status: Home / Self Care | Payer: Medicare Other | Source: Ambulatory Visit | Attending: Internal Medicine | Admitting: Internal Medicine

## 2015-07-19 ENCOUNTER — Other Ambulatory Visit (HOSPITAL_COMMUNITY): Payer: Self-pay | Admitting: Internal Medicine

## 2015-07-19 DIAGNOSIS — G8929 Other chronic pain: Secondary | ICD-10-CM | POA: Insufficient documentation

## 2015-07-19 DIAGNOSIS — M25551 Pain in right hip: Secondary | ICD-10-CM | POA: Insufficient documentation

## 2015-07-19 DIAGNOSIS — M1612 Unilateral primary osteoarthritis, left hip: Secondary | ICD-10-CM | POA: Diagnosis not present

## 2015-07-26 DIAGNOSIS — N183 Chronic kidney disease, stage 3 (moderate): Secondary | ICD-10-CM | POA: Diagnosis not present

## 2015-07-26 DIAGNOSIS — Z905 Acquired absence of kidney: Secondary | ICD-10-CM | POA: Diagnosis not present

## 2015-07-26 DIAGNOSIS — R600 Localized edema: Secondary | ICD-10-CM | POA: Diagnosis not present

## 2015-07-26 DIAGNOSIS — I129 Hypertensive chronic kidney disease with stage 1 through stage 4 chronic kidney disease, or unspecified chronic kidney disease: Secondary | ICD-10-CM | POA: Diagnosis not present

## 2015-08-22 DIAGNOSIS — M25551 Pain in right hip: Secondary | ICD-10-CM | POA: Diagnosis not present

## 2015-08-22 DIAGNOSIS — M7061 Trochanteric bursitis, right hip: Secondary | ICD-10-CM | POA: Diagnosis not present

## 2015-09-25 ENCOUNTER — Ambulatory Visit (HOSPITAL_COMMUNITY): Payer: Medicare Other | Attending: Orthopedic Surgery | Admitting: Physical Therapy

## 2015-09-25 DIAGNOSIS — M25551 Pain in right hip: Secondary | ICD-10-CM | POA: Insufficient documentation

## 2015-09-25 DIAGNOSIS — M6281 Muscle weakness (generalized): Secondary | ICD-10-CM

## 2015-09-25 DIAGNOSIS — R2681 Unsteadiness on feet: Secondary | ICD-10-CM | POA: Insufficient documentation

## 2015-09-25 DIAGNOSIS — R262 Difficulty in walking, not elsewhere classified: Secondary | ICD-10-CM | POA: Diagnosis not present
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
## 2015-10-01 ENCOUNTER — Ambulatory Visit (HOSPITAL_COMMUNITY): Payer: Medicare Other | Admitting: Physical Therapy

## 2015-10-01 DIAGNOSIS — R262 Difficulty in walking, not elsewhere classified: Secondary | ICD-10-CM

## 2015-10-01 DIAGNOSIS — M6281 Muscle weakness (generalized): Secondary | ICD-10-CM | POA: Diagnosis not present

## 2015-10-01 DIAGNOSIS — R2681 Unsteadiness on feet: Secondary | ICD-10-CM

## 2015-10-01 DIAGNOSIS — M25551 Pain in right hip: Secondary | ICD-10-CM | POA: Diagnosis not present

## 2015-10-01 NOTE — Therapy (Signed)
[place], [place], [address] Phone: [phone]   Fax:  [phone]  Physical Therapy Treatment  Patient Details  Name: [name] [name] MRN: [id number] Date of Birth: Dec 08, 1928 Referring Provider: Rod Can   Encounter Date: 10/01/2015      PT End of Session - 10/01/15 1723    Visit Number 2   Number of Visits 16   Date for PT Re-Evaluation 10/23/15   Authorization Type Medicare AARP Medicare Complete    Authorization Time Period 09/25/15 to 11/25/15   Authorization - Visit Number 2   Authorization - Number of Visits 10   PT Start Time 1432   PT Stop Time 1513   PT Time Calculation (min) 41 min   Activity Tolerance Patient tolerated treatment well   Behavior During Therapy [facility] for tasks assessed/performed      Past Medical History  Diagnosis Date  . Mixed hyperlipidemia   . 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 Essential hypertension, benign   . Postsurgical aortocoronary bypass status   . Dizziness   . Obesity   . Back pain   . CAD (coronary artery disease)   . Seizure ([place])   . Bladder cancer [facility])     Past Surgical History  Procedure Laterality Date  . Cataract extraction    . Coronary artery bypass graft    . Iridectomy    . Nephrectomy  2000    There were no vitals filed for this visit.      Subjective Assessment - 10/01/15 1436    Subjective Patient reports she is having not that great a day; no major changes; has not noticed any major chagnes with heat pack. No falls since last time.    Pertinent History HTN, CKD, history of seizures (medically managed), history of CA, macular degeneration, history of heart surgery in 1989, hernia R side    Currently in Pain? Yes   Pain Score 10-Worst pain ever   Pain Location Hip   Pain Orientation Right   Pain Descriptors / Indicators Stabbing   Pain Type Chronic pain   Pain Radiating Towards none    Pain Onset More than a month ago   Pain  Frequency Constant   Aggravating Factors  weight bearing    Pain Relieving Factors sitting down    Effect of Pain on Daily Activities cannot climb steps, cannot walk for long distances                          [facility] Adult PT Treatment/Exercise - 10/01/15 0001    Ambulation/Gait   Gait Comments gait training with proper height SPC as well as correction of form and technique for SPC use, min guard due to unsteadiness    Knee/Hip Exercises: Stretches   Passive Hamstring Stretch 2 reps;30 seconds;Left   Passive Hamstring Stretch Limitations attempted, would not allow quality stretch on R LE    Piriformis Stretch 2 reps;30 seconds;Left   Piriformis Stretch Limitations attempted, would not allow quality stretch R LE    Knee/Hip Exercises: Supine   Bridges 1 set;10 reps   Bridges Limitations Mod assist, palpated minimal effort    Straight Leg Raises Both;1 set;5 reps   Straight Leg Raises Limitations min assist L LE, mod assist R LE    Manual Therapy   Manual Therapy Soft tissue mobilization   Manual therapy comments performed separately from all other skilled services  Soft tissue mobilization R gluts, hp abductors, TFL                 PT Education - 10/01/15 1723    Education provided Yes   Education Details reviewed initial eval and goals, reciprocal gait with cane in L UE, importance of participation in activities and exercise to address symptoms, reasoning behind treatments selected today    Person(s) Educated Patient;Child(ren)   Methods Explanation   Comprehension Verbalized understanding;Returned demonstration          PT Short Term Goals - 09/25/15 1812    PT SHORT TERM GOAL #1   Title Patient to demonstrate correct posture at least 75% of the time in order to improve balance and improve mechanics    Time 4   Period Weeks   Status New   PT SHORT TERM GOAL #2   Title Patient will perform TUG in less than 15 seconds with LRAD in order to  demonstrate improved balance/reduced fall risk    Time 4   Period Weeks   Status New   PT SHORT TERM GOAL #3   Title Patient will demonstrate reduced muscle spasm by at least 60% in order to reduce pain and improve mechanics    Time 4   Period Weeks   Status New   PT SHORT TERM GOAL #4   Title Patient will consistently and correctly perform appropriate HEP, to be updated PRN    Time 4   Period Weeks   Status New           PT Long Term Goals - 09/25/15 1814    PT LONG TERM GOAL #1   Title Patient will be independent with all functional sit to stand and stand-pivot transfers with LRAD in order to improve overall mobiltiy    Time 8   Period Weeks   Status New   PT LONG TERM GOAL #2   Title Patient to demosntrate strength 4/5 in all tested groups in order to reduce pain and improve mobilty    Time 8   Period Weeks   Status New   PT LONG TERM GOAL #3   Title Patient will be able to ambulate and stand for at least 45 minutes with LRAD and pain no more than 3/10 in order to demonstrate overall improvement in condition    Time 8   Period Weeks   Status New   PT LONG TERM GOAL #4   Title Paitent to be independent in bed mobility in order to improve overall mobility and QOL at home    Time [place] - 10/01/15 1724    Clinical Impression Statement Patient arrived today reporting that she is not having a very good day, but will do what she can; noted that patient continues to use inappropriately sized SPC in incorrect UE as well. Attempted functional stretching, with patient able to tolerate on L LE, however would not allow PT to mobilize LE through appropriate range for stretches on R side today, rendering minimal benefit from activity. Also attempted to perform manual to R gluts/hip ABD/TFL however patient unable to tolerate even minimal to moderate pressure in key areas today, eventually asking PT to stop manual techniques. Attempted  basic supine exercises including bridge and SLR, however patient required min-mod assist for this, and did note reduced muscle activation when palpating during activities.  Finished session with education regarding correct size/height and use of assistive device, also performed gait training with [place] set to appropriate height and utilized on correct side today. Reviewed initial eval and goals at end of session.    Rehab Potential Good   PT Frequency 2x / week   PT Duration 8 weeks   PT Treatment/Interventions ADLs/Self Care Home Management;Moist Heat;Gait training;Stair training;Functional mobility training;Therapeutic activities;Therapeutic exercise;Balance training;Neuromuscular re-education;Patient/family education;Manual techniques;Passive range of motion;Energy conservation;Taping   PT Next Visit Plan  functional stretching and strengthening, gait and balance training, manual to R hip area    PT Home Exercise Plan given    Consulted and Agree with Plan of Care Patient      Patient will benefit from skilled therapeutic intervention in order to improve the following deficits and impairments:  Abnormal gait, Decreased endurance, Decreased activity tolerance, Decreased strength, Pain, Decreased balance, Decreased mobility, Difficulty walking, Increased muscle spasms, Improper body mechanics, Decreased coordination, Impaired flexibility, Postural dysfunction  Visit Diagnosis: Pain in right hip  Muscle weakness (generalized)  Difficulty in walking, not elsewhere classified  Unsteadiness on feet     Problem List Patient Active Problem List   Diagnosis Date Noted  . Tension headache 11/10/2013  . Chest pain 11/03/2011  . ARF (acute renal failure) ([place]) 04/30/2011  . UTI (urinary tract infection) 04/30/2011  . CKD (chronic kidney disease), stage IV ([place]) 04/30/2011  . Anemia 04/30/2011  . S/p nephrectomy 04/30/2011  . Macular degeneration 04/30/2011  . Cataract 04/30/2011  . Bladder  cancer ([place]) 04/30/2011  . Weakness generalized 04/29/2011  . Seizure disorder ([place]) 04/29/2011  . Benign hypertension 04/29/2011  . CAD (coronary artery disease) 04/29/2011  . Hyperlipidemia 04/29/2011  . Anxiety 04/29/2011  . Dizziness - light-headed 04/29/2011  . Chronic abdominal pain 04/29/2011  . MIXED HYPERLIPIDEMIA 05/24/2008  . ESSENTIAL HYPERTENSION, BENIGN 05/24/2008  . Postsurgical aortocoronary bypass status 05/24/2008    Deniece Ree PT, DPT [facility] [address] [place], [place], [address] Phone: [phone]   Fax:  [phone]  Name: MAZIKEEN COODY MRN: [id number] Date of Birth: November 24, 1928

## 2015-10-01 NOTE — Patient Instructions (Signed)
   GAIT WITH CANE - STEP THRU PATTERN  Move the cane and your right foot forward. The right  foot should lined up with the cane.   Next, press down on the cane with your hand and step forward with your left leg. You can take natural reciprocal steps.  The cane should be used in the opposite hand of the affected leg, unless otherwise directed.

## 2015-10-03 ENCOUNTER — Encounter (HOSPITAL_COMMUNITY): Payer: Self-pay

## 2015-10-03 ENCOUNTER — Telehealth (HOSPITAL_COMMUNITY): Payer: Self-pay

## 2015-10-03 NOTE — Telephone Encounter (Signed)
10/03/15 cx - sick with stomach issues

## 2015-10-05 DIAGNOSIS — S50812A Abrasion of left forearm, initial encounter: Secondary | ICD-10-CM | POA: Diagnosis not present

## 2015-10-05 DIAGNOSIS — R197 Diarrhea, unspecified: Secondary | ICD-10-CM | POA: Diagnosis not present

## 2015-10-11 ENCOUNTER — Ambulatory Visit (HOSPITAL_COMMUNITY): Payer: Medicare Other | Attending: Orthopedic Surgery | Admitting: Physical Therapy

## 2015-10-11 DIAGNOSIS — R262 Difficulty in walking, not elsewhere classified: Secondary | ICD-10-CM | POA: Diagnosis not present

## 2015-10-11 DIAGNOSIS — M6281 Muscle weakness (generalized): Secondary | ICD-10-CM | POA: Diagnosis not present

## 2015-10-11 DIAGNOSIS — M25551 Pain in right hip: Secondary | ICD-10-CM | POA: Diagnosis not present

## 2015-10-11 DIAGNOSIS — R2681 Unsteadiness on feet: Secondary | ICD-10-CM

## 2015-10-11 NOTE — Therapy (Signed)
[place], [place], [address] Phone: [phone]   Fax:  [phone]  Physical Therapy Treatment  Patient Details  Name: [name] [name] MRN: [id number] Date of Birth: 12-06-1928 Referring Provider: Rod Can   Encounter Date: 10/11/2015      PT End of Session - 10/11/15 1153    Visit Number 3   Number of Visits 16   Date for PT Re-Evaluation 10/23/15   Authorization Type Medicare AARP Medicare Complete    Authorization Time Period 09/25/15 to 11/25/15   Authorization - Visit Number 3   Authorization - Number of Visits 10   PT Start Time 1120   PT Stop Time 1200   PT Time Calculation (min) 40 min   Activity Tolerance Patient tolerated treatment well   Behavior During Therapy [facility] for tasks assessed/performed      Past Medical History  Diagnosis Date  . Mixed hyperlipidemia   . 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 Essential hypertension, benign   . Postsurgical aortocoronary bypass status   . Dizziness   . Obesity   . Back pain   . CAD (coronary artery disease)   . Seizure ([place])   . Bladder cancer [facility])     Past Surgical History  Procedure Laterality Date  . Cataract extraction    . Coronary artery bypass graft    . Iridectomy    . Nephrectomy  2000    There were no vitals filed for this visit.      Subjective Assessment - 10/11/15 1123    Subjective (p) Pt states that her hip is feeling great     Pertinent History (p) HTN, CKD, history of seizures (medically managed), history of CA, macular degeneration, history of heart surgery in 1989, hernia R side    How long can you sit comfortably? (p) limits sitting- unable to identify exact number    How long can you stand comfortably? (p) immedite discomfort    How long can you walk comfortably? (p) 5 minutes    Patient Stated Goals (p) get rid of pain    Currently in Pain? (p) Yes   Pain Score (p) 7    Pain Location (p) Hip   Pain Orientation  (p) Right   Pain Onset (p) More than a month ago                         [facility] Adult PT Treatment/Exercise - 10/11/15 0001    Ambulation/Gait   Gait Comments gait training with proper height SPC as well as correction of form and technique for SPC use, min guard due to unsteadiness    Knee/Hip Exercises: Stretches   Passive Hamstring Stretch Right;2 reps;20 seconds   Knee/Hip Exercises: Standing   Other Standing Knee Exercises 3 D hip excursion    Knee/Hip Exercises: Supine   Bridges with Ball Squeeze 10 reps   Straight Leg Raises Both;10 reps   Straight Leg Raises Limitations bent knee   Other Supine Knee/Hip Exercises decompressive 1-5    Other Supine Knee/Hip Exercises clam x 10                 PT Education - 10/11/15 1153    Education provided Yes   Education Details HEP   Person(s) Educated Patient   Methods Explanation;Handout;Verbal cues   Comprehension Verbalized understanding;Returned demonstration          PT Short Term Goals -  10/11/15 1156    PT SHORT TERM GOAL #1   Title Patient to demonstrate correct posture at least 75% of the time in order to improve balance and improve mechanics    Time 4   Period Weeks   Status On-going   PT SHORT TERM GOAL #2   Title Patient will perform TUG in less than 15 seconds with LRAD in order to demonstrate improved balance/reduced fall risk    Time 4   Period Weeks   Status On-going   PT SHORT TERM GOAL #3   Title Patient will demonstrate reduced muscle spasm by at least 60% in order to reduce pain and improve mechanics    Time 4   Period Weeks   Status On-going   PT SHORT TERM GOAL #4   Title Patient will consistently and correctly perform appropriate HEP, to be updated PRN    Time 4   Period Weeks   Status On-going           PT Long Term Goals - 10/11/15 1156    PT LONG TERM GOAL #1   Title Patient will be independent with all functional sit to stand and stand-pivot transfers with LRAD  in order to improve overall mobiltiy    Time 8   Period Weeks   Status On-going   PT LONG TERM GOAL #2   Title Patient to demosntrate strength 4/5 in all tested groups in order to reduce pain and improve mobilty    Time 8   Period Weeks   Status On-going   PT LONG TERM GOAL #3   Title Patient will be able to ambulate and stand for at least 45 minutes with LRAD and pain no more than 3/10 in order to demonstrate overall improvement in condition    Time 8   Period Weeks   Status On-going   PT LONG TERM GOAL #4   Title Paitent to be independent in bed mobility in order to improve overall mobility and QOL at home    Time 8   Period Weeks   Status On-going               Plan - 10/11/15 1154    Clinical Impression Statement Pt comes to the department with a cane that is to tall for pt.  Therapist adjusted cane.  Pt instructed in new exercises both for stretching, strengthening and posture with therapist facilitation for proper technique.    Rehab Potential Good   PT Frequency 2x / week   PT Duration 8 weeks   PT Treatment/Interventions ADLs/Self Care Home Management;Moist Heat;Gait training;Stair training;Functional mobility training;Therapeutic activities;Therapeutic exercise;Balance training;Neuromuscular re-education;Patient/family education;Manual techniques;Passive range of motion;Energy conservation;Taping   PT Next Visit Plan  functional stretching and strengthening, gait and balance training, manual to R hip area    PT Home Exercise Plan given    Consulted and Agree with Plan of Care Patient      Patient will benefit from skilled therapeutic intervention in order to improve the following deficits and impairments:  Abnormal gait, Decreased endurance, Decreased activity tolerance, Decreased strength, Pain, Decreased balance, Decreased mobility, Difficulty walking, Increased muscle spasms, Improper body mechanics, Decreased coordination, Impaired flexibility, Postural  dysfunction  Visit Diagnosis: Pain in right hip  Muscle weakness (generalized)  Difficulty in walking, not elsewhere classified  Unsteadiness on feet     Problem List Patient Active Problem List   Diagnosis Date Noted  . Tension headache 11/10/2013  . Chest pain 11/03/2011  . ARF (  acute renal failure) ([place]) 04/30/2011  . UTI (urinary tract infection) 04/30/2011  . CKD (chronic kidney disease), stage IV ([place]) 04/30/2011  . Anemia 04/30/2011  . S/p nephrectomy 04/30/2011  . Macular degeneration 04/30/2011  . Cataract 04/30/2011  . Bladder cancer ([place]) 04/30/2011  . Weakness generalized 04/29/2011  . Seizure disorder ([place]) 04/29/2011  . Benign hypertension 04/29/2011  . CAD (coronary artery disease) 04/29/2011  . Hyperlipidemia 04/29/2011  . Anxiety 04/29/2011  . Dizziness - light-headed 04/29/2011  . Chronic abdominal pain 04/29/2011  . MIXED HYPERLIPIDEMIA 05/24/2008  . ESSENTIAL HYPERTENSION, BENIGN 05/24/2008  . Postsurgical aortocoronary bypass status 05/24/2008    Rayetta Humphrey, PT CLT [phone] 10/11/2015, 12:06 PM  [facility] [address] [place], [place], [address] Phone: [phone]   Fax:  [phone]  Name: ANNYAH SUNDSTROM MRN: [id number] Date of Birth: 11-Feb-1929

## 2015-10-11 NOTE — Patient Instructions (Signed)
Bridging   Squeeze a pillow between your legs.  Now  Slowly raise buttocks from floor, keeping stomach tight. Repeat _10-15___ times per set. Do 1____ sets per session. Do __2__ sessions per day.  http://orth.exer.us/1096   Copyright  VHI. All rights reserved.  Bent Leg Lift (Hook-Lying)    Tighten stomach and slowly raise right leg _3___ inches from floor. Keep trunk rigid. Hold __10__ seconds. Repeat __10__ times per set. Do ___1_ sets per session. Do ___2_ sessions per day. As this gets easier slowly straighten your knees.  http://orth.exer.us/1090   Copyright  VHI. All rights reserved.  Hip Abduction / Adduction: with Knee Flexion (Supine)    With right knee bent, gently lower knee to side and return. Repeat _10___ times per set. Do __1__ sets per session. Do _3___ sessions per day.  http://orth.exer.us/682   Copyright  VHI. All rights reserved.  Stretching: Hamstring (Supine)    Supporting right thigh behind knee, slowly straighten knee until stretch is felt in back of thigh. Hold _20___ seconds. Repeat __1__ times per set. Do ___1_ sets per session. Do __2__ sessions per day.  http://orth.exer.us/656   Copyright  VHI. All rights reserved.

## 2015-10-17 ENCOUNTER — Ambulatory Visit (HOSPITAL_COMMUNITY): Payer: Medicare Other

## 2015-10-17 DIAGNOSIS — M6281 Muscle weakness (generalized): Secondary | ICD-10-CM | POA: Diagnosis not present

## 2015-10-17 DIAGNOSIS — R262 Difficulty in walking, not elsewhere classified: Secondary | ICD-10-CM | POA: Diagnosis not present

## 2015-10-17 DIAGNOSIS — M25551 Pain in right hip: Secondary | ICD-10-CM | POA: Diagnosis not present

## 2015-10-17 DIAGNOSIS — R2681 Unsteadiness on feet: Secondary | ICD-10-CM

## 2015-10-17 NOTE — Therapy (Signed)
[place], [place], [address] Phone: [phone]   Fax:  [phone]  Physical Therapy Treatment  Patient Details  Name: [name] [name] MRN: [id number] Date of Birth: 1928-08-17 Referring Provider: Rod Can   Encounter Date: 10/17/2015      PT End of Session - 10/17/15 1453    Visit Number 4   Number of Visits 16   Date for PT Re-Evaluation 10/23/15   Authorization Type Medicare AARP Medicare Complete    Authorization Time Period 09/25/15 to 11/25/15   Authorization - Visit Number 4   Authorization - Number of Visits 10   PT Start Time [id number]   PT Stop Time 1518   PT Time Calculation (min) 44 min   Activity Tolerance Patient tolerated treatment well   Behavior During Therapy [facility] for tasks assessed/performed      Past Medical History  Diagnosis Date  . Mixed hyperlipidemia   . 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 Essential hypertension, benign   . Postsurgical aortocoronary bypass status   . Dizziness   . Obesity   . Back pain   . CAD (coronary artery disease)   . Seizure ([place])   . Bladder cancer [facility])     Past Surgical History  Procedure Laterality Date  . Cataract extraction    . Coronary artery bypass graft    . Iridectomy    . Nephrectomy  2000    There were no vitals filed for this visit.      Subjective Assessment - 10/17/15 1448    Subjective Pt reports she fell off the bed following exercises Saturday and got up indepdently afterwards with increased Rt hip pain following.  Current pain scale 7/10   Pertinent History HTN, CKD, history of seizures (medically managed), history of CA, macular degeneration, history of heart surgery in 1989, hernia R side    Patient Stated Goals get rid of pain    Currently in Pain? Yes   Pain Score 7    Pain Location Hip   Pain Orientation Right   Pain Descriptors / Indicators Aching   Pain Type Chronic pain   Pain Radiating Towards none   Pain Onset  More than a month ago   Pain Frequency Constant   Aggravating Factors  weight bearing   Pain Relieving Factors sitting down   Effect of Pain on Daily Activities cannot climb steps, cannot walk for long distances             [facility] Adult PT Treatment/Exercise - 10/17/15 0001    Ambulation/Gait   Gait Comments gait training with proper height SPC as well as correction of form, sequence and technique for SPC use, min guard due to unsteadiness    Knee/Hip Exercises: Standing   Other Standing Knee Exercises 3 D hip excursion    Other Standing Knee Exercises NBOS 2x 30"- c/o dizziness sat, water and BP taken   Knee/Hip Exercises: Supine   Bridges with Ball Squeeze 10 reps   Straight Leg Raises Limitations Lt LE 8 reps prior fatigue   Other Supine Knee/Hip Exercises clam RTB x 10                   PT Short Term Goals - 10/11/15 1156    PT SHORT TERM GOAL #1   Title Patient to demonstrate correct posture at least 75% of the time in order to improve balance and improve mechanics    Time  4   Period Weeks   Status On-going   PT SHORT TERM GOAL #2   Title Patient will perform TUG in less than 15 seconds with LRAD in order to demonstrate improved balance/reduced fall risk    Time 4   Period Weeks   Status On-going   PT SHORT TERM GOAL #3   Title Patient will demonstrate reduced muscle spasm by at least 60% in order to reduce pain and improve mechanics    Time 4   Period Weeks   Status On-going   PT SHORT TERM GOAL #4   Title Patient will consistently and correctly perform appropriate HEP, to be updated PRN    Time 4   Period Weeks   Status On-going           PT Long Term Goals - 10/11/15 1156    PT LONG TERM GOAL #1   Title Patient will be independent with all functional sit to stand and stand-pivot transfers with LRAD in order to improve overall mobiltiy    Time 8   Period Weeks   Status On-going   PT LONG TERM GOAL #2   Title Patient to demosntrate strength  4/5 in all tested groups in order to reduce pain and improve mobilty    Time 8   Period Weeks   Status On-going   PT LONG TERM GOAL #3   Title Patient will be able to ambulate and stand for at least 45 minutes with LRAD and pain no more than 3/10 in order to demonstrate overall improvement in condition    Time 8   Period Weeks   Status On-going   PT LONG TERM GOAL #4   Title Paitent to be independent in bed mobility in order to improve overall mobility and QOL at home    Time 8   Period Weeks   Status On-going               Plan - 10/17/15 1455    Clinical Impression Statement Pt entered dept with [place] [facility] reporting fall off of bed on Saturday following exercises.  Began session with education on use of proper sequence with cane with mod cueing and min guard for unsteadiness with gait.  Session focus on strengthening, education of importance of posture and balance training with therapist facilitation for proper technqiue with majority of exercises.  EOS pt limited by fatigue, no reports of increased pain through session.  Pt did c/o dizziness with supine to sitting then with standing balance activities, water given and BP taken at 138/78 mmHg.   Rehab Potential Good   PT Frequency 2x / week   PT Duration 8 weeks   PT Treatment/Interventions ADLs/Self Care Home Management;Moist Heat;Gait training;Stair training;Functional mobility training;Therapeutic activities;Therapeutic exercise;Balance training;Neuromuscular re-education;Patient/family education;Manual techniques;Passive range of motion;Energy conservation;Taping   PT Next Visit Plan  functional stretching and strengthening, gait and balance training, manual to R hip area       Patient will benefit from skilled therapeutic intervention in order to improve the following deficits and impairments:  Abnormal gait, Decreased endurance, Decreased activity tolerance, Decreased strength, Pain, Decreased balance, Decreased mobility,  Difficulty walking, Increased muscle spasms, Improper body mechanics, Decreased coordination, Impaired flexibility, Postural dysfunction  Visit Diagnosis: Pain in right hip  Muscle weakness (generalized)  Difficulty in walking, not elsewhere classified  Unsteadiness on feet     Problem List Patient Active Problem List   Diagnosis Date Noted  . Tension headache 11/10/2013  . Chest pain 11/03/2011  .  ARF (acute renal failure) ([place]) 04/30/2011  . UTI (urinary tract infection) 04/30/2011  . CKD (chronic kidney disease), stage IV ([place]) 04/30/2011  . Anemia 04/30/2011  . S/p nephrectomy 04/30/2011  . Macular degeneration 04/30/2011  . Cataract 04/30/2011  . Bladder cancer ([place]) 04/30/2011  . Weakness generalized 04/29/2011  . Seizure disorder ([place]) 04/29/2011  . Benign hypertension 04/29/2011  . CAD (coronary artery disease) 04/29/2011  . Hyperlipidemia 04/29/2011  . Anxiety 04/29/2011  . Dizziness - light-headed 04/29/2011  . Chronic abdominal pain 04/29/2011  . MIXED HYPERLIPIDEMIA 05/24/2008  . ESSENTIAL HYPERTENSION, BENIGN 05/24/2008  . Postsurgical aortocoronary bypass status 05/24/2008   Ihor Austin, LPTA; [place]  Aldona Lento 10/17/2015, 6:35 PM  [facility], [place], [address] Phone: [phone]   Fax:  [phone]  Name: JANIYLAH COOKSLEY MRN: [id number] Date of Birth: 1928-07-04

## 2015-10-19 ENCOUNTER — Ambulatory Visit (HOSPITAL_COMMUNITY): Payer: Medicare Other

## 2015-10-19 DIAGNOSIS — R262 Difficulty in walking, not elsewhere classified: Secondary | ICD-10-CM | POA: Diagnosis not present

## 2015-10-19 DIAGNOSIS — M25551 Pain in right hip: Secondary | ICD-10-CM

## 2015-10-19 DIAGNOSIS — R2681 Unsteadiness on feet: Secondary | ICD-10-CM | POA: Diagnosis not present

## 2015-10-19 DIAGNOSIS — M6281 Muscle weakness (generalized): Secondary | ICD-10-CM | POA: Diagnosis not present

## 2015-10-19 NOTE — Therapy (Signed)
[name], [place], [address] Phone: [phone]   Fax:  [phone]  Physical Therapy Treatment  Patient Details  Name: [name] [name] MRN: [id number] Date of Birth: 05/08/1928 Referring Provider: Rod Can   Encounter Date: 10/19/2015      PT End of Session - 10/19/15 1444    Visit Number 5   Number of Visits 16   Date for PT Re-Evaluation 10/23/15   Authorization Type Medicare AARP Medicare Complete    Authorization Time Period 09/25/15 to 11/25/15   Authorization - Visit Number 5   Authorization - Number of Visits 10   PT Start Time [id number]   PT Stop Time 1520   PT Time Calculation (min) 48 min   Activity Tolerance Patient tolerated treatment well   Behavior During Therapy [facility] for tasks assessed/performed      Past Medical History  Diagnosis Date  . Mixed hyperlipidemia   . 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 Essential hypertension, benign   . Postsurgical aortocoronary bypass status   . Dizziness   . Obesity   . Back pain   . CAD (coronary artery disease)   . Seizure ([place])   . Bladder cancer [facility])     Past Surgical History  Procedure Laterality Date  . Cataract extraction    . Coronary artery bypass graft    . Iridectomy    . Nephrectomy  2000    There were no vitals filed for this visit.      Subjective Assessment - 10/19/15 1432    Subjective Pt stated Rt hip pain scale 6-7/10 following fall off bed on Saturday.  Reports she continues to be compliant wtih HEP   Pertinent History HTN, CKD, history of seizures (medically managed), history of CA, macular degeneration, history of heart surgery in 1989, hernia R side    Patient Stated Goals get rid of pain    Currently in Pain? Yes   Pain Score 6    Pain Location Hip   Pain Orientation Right   Pain Descriptors / Indicators Constant;Aching   Pain Type Chronic pain   Pain Radiating Towards none   Pain Onset More than a month ago   Pain  Frequency Constant   Aggravating Factors  weight bearing   Pain Relieving Factors sitting down   Effect of Pain on Daily Activities cannot climb steps, cannot walk for long distances           [facility] Adult PT Treatment/Exercise - 10/19/15 0001    Ambulation/Gait   Gait Comments gait training with proper height SPC as well as correction of form, sequence and technique for SPC use, min guard due to unsteadiness    Knee/Hip Exercises: Standing   Other Standing Knee Exercises 3 D hip excursion    Other Standing Knee Exercises NBOS 2x 30", Partial Tandem stance 2x 30"   Knee/Hip Exercises: Supine   Bridges with Ball Squeeze 10 reps   Straight Leg Raises Both;10 reps   Other Supine Knee/Hip Exercises clam RTB x 10    Manual Therapy   Manual Therapy Soft tissue mobilization   Manual therapy comments performed separately from all other skilled services    Soft tissue mobilization R gluts, hp abductors, TFL              PT Short Term Goals - 10/11/15 1156    PT SHORT TERM GOAL #1   Title Patient to demonstrate correct posture at  least 75% of the time in order to improve balance and improve mechanics    Time 4   Period Weeks   Status On-going   PT SHORT TERM GOAL #2   Title Patient will perform TUG in less than 15 seconds with LRAD in order to demonstrate improved balance/reduced fall risk    Time 4   Period Weeks   Status On-going   PT SHORT TERM GOAL #3   Title Patient will demonstrate reduced muscle spasm by at least 60% in order to reduce pain and improve mechanics    Time 4   Period Weeks   Status On-going   PT SHORT TERM GOAL #4   Title Patient will consistently and correctly perform appropriate HEP, to be updated PRN    Time 4   Period Weeks   Status On-going           PT Long Term Goals - 10/11/15 1156    PT LONG TERM GOAL #1   Title Patient will be independent with all functional sit to stand and stand-pivot transfers with LRAD in order to improve overall  mobiltiy    Time 8   Period Weeks   Status On-going   PT LONG TERM GOAL #2   Title Patient to demosntrate strength 4/5 in all tested groups in order to reduce pain and improve mobilty    Time 8   Period Weeks   Status On-going   PT LONG TERM GOAL #3   Title Patient will be able to ambulate and stand for at least 45 minutes with LRAD and pain no more than 3/10 in order to demonstrate overall improvement in condition    Time 8   Period Weeks   Status On-going   PT LONG TERM GOAL #4   Title Paitent to be independent in bed mobility in order to improve overall mobility and QOL at home    Time 8   Period Weeks   Status On-going               Plan - 10/19/15 1618    Clinical Impression Statement Pt continues to required moderate verbal cueing and demonstration for appropraite sequence with SPC, min guard for unsteadiness.  Session focus on improving proximal musculature strengthening and began tandem stance to address balance with min A required to redice risk of fall.  Ended session with manual soft tissue technqiues to Rt hip/gluteal region for pain control.  EOS pt with improved gait mechanics and reports pain minimal.  Therapist facilitation for proper form, technique and posture cueing through session.     Rehab Potential Good   PT Frequency 2x / week   PT Duration 8 weeks   PT Treatment/Interventions ADLs/Self Care Home Management;Moist Heat;Gait training;Stair training;Functional mobility training;Therapeutic activities;Therapeutic exercise;Balance training;Neuromuscular re-education;Patient/family education;Manual techniques;Passive range of motion;Energy conservation;Taping   PT Next Visit Plan  functional stretching and strengthening, gait and balance training, manual to R hip area       Patient will benefit from skilled therapeutic intervention in order to improve the following deficits and impairments:  Abnormal gait, Decreased endurance, Decreased activity tolerance,  Decreased strength, Pain, Decreased balance, Decreased mobility, Difficulty walking, Increased muscle spasms, Improper body mechanics, Decreased coordination, Impaired flexibility, Postural dysfunction  Visit Diagnosis: Pain in right hip  Muscle weakness (generalized)  Difficulty in walking, not elsewhere classified  Unsteadiness on feet     Problem List Patient Active Problem List   Diagnosis Date Noted  . Tension headache 11/10/2013  .  Chest pain 11/03/2011  . ARF (acute renal failure) ([place]) 04/30/2011  . UTI (urinary tract infection) 04/30/2011  . CKD (chronic kidney disease), stage IV ([place]) 04/30/2011  . Anemia 04/30/2011  . S/p nephrectomy 04/30/2011  . Macular degeneration 04/30/2011  . Cataract 04/30/2011  . Bladder cancer ([address]) 04/30/2011  . Weakness generalized 04/29/2011  . Seizure disorder ([place]) 04/29/2011  . Benign hypertension 04/29/2011  . CAD (coronary artery disease) 04/29/2011  . Hyperlipidemia 04/29/2011  . Anxiety 04/29/2011  . Dizziness - light-headed 04/29/2011  . Chronic abdominal pain 04/29/2011  . MIXED HYPERLIPIDEMIA 05/24/2008  . ESSENTIAL HYPERTENSION, BENIGN 05/24/2008  . Postsurgical aortocoronary bypass status 05/24/2008   Ihor Austin, LPTA; [place]  Aldona Lento 10/19/2015, 4:24 PM  [place], [place], [address] Phone: [phone]   Fax:  [phone]  Name: GRETCHEN MIGLIORI MRN: [id number] Date of Birth: Feb 06, 1929

## 2015-10-23 ENCOUNTER — Telehealth (HOSPITAL_COMMUNITY): Payer: Self-pay | Admitting: Physical Therapy

## 2015-10-23 ENCOUNTER — Ambulatory Visit (HOSPITAL_COMMUNITY): Payer: Medicare Other | Admitting: Physical Therapy

## 2015-10-23 NOTE — Telephone Encounter (Signed)
Pt did not show for appointment. Called and spoke to patient who said she did forget, however probobly wouldn't be able to come anyway because she is hurting all over today.  Pt reminded of next appt on Friday at 1:45. Teena Irani, PTA/CLT [phone]

## 2015-10-26 ENCOUNTER — Ambulatory Visit (HOSPITAL_COMMUNITY): Payer: Medicare Other

## 2015-10-26 DIAGNOSIS — F5101 Primary insomnia: Secondary | ICD-10-CM | POA: Diagnosis not present

## 2015-10-26 DIAGNOSIS — G894 Chronic pain syndrome: Secondary | ICD-10-CM | POA: Diagnosis not present

## 2015-10-26 DIAGNOSIS — M25551 Pain in right hip: Secondary | ICD-10-CM | POA: Diagnosis not present

## 2015-10-26 DIAGNOSIS — R262 Difficulty in walking, not elsewhere classified: Secondary | ICD-10-CM | POA: Diagnosis not present

## 2015-10-26 DIAGNOSIS — M6281 Muscle weakness (generalized): Secondary | ICD-10-CM

## 2015-10-26 DIAGNOSIS — R2681 Unsteadiness on feet: Secondary | ICD-10-CM | POA: Diagnosis not present

## 2015-10-26 DIAGNOSIS — R252 Cramp and spasm: Secondary | ICD-10-CM | POA: Diagnosis not present

## 2015-10-26 NOTE — Therapy (Signed)
[place], [place], [address] Phone: [phone]   Fax:  [phone]  Physical Therapy Treatment  Patient Details  Name: [name] [name] MRN: [id number] Date of Birth: 1929/03/28 Referring Provider: Rod Can   Encounter Date: 11/13/15      PT End of Session - 11/13/15 1423    Visit Number 6   Number of Visits 16   Date for PT Re-Evaluation 10/23/15   Authorization Type Medicare AARP Medicare Complete    Authorization Time Period 10/13/15 to 12/13/2015 (G Codes done on 11/13/2022)   Authorization - Visit Number 6   Authorization - Number of Visits 20   PT Start Time 1350   PT Stop Time 1428   PT Time Calculation (min) 38 min   Activity Tolerance Patient tolerated treatment well  Pain decreased at end of session.   Behavior During Therapy [facility] for tasks assessed/performed      Past Medical History  Diagnosis Date  . Mixed hyperlipidemia   . 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 Essential hypertension, benign   . Postsurgical aortocoronary bypass status   . Dizziness   . Obesity   . Back pain   . CAD (coronary artery disease)   . Seizure ([place])   . Bladder cancer [facility])     Past Surgical History  Procedure Laterality Date  . Cataract extraction    . Coronary artery bypass graft    . Iridectomy    . Nephrectomy  2000    There were no vitals filed for this visit.      Subjective Assessment - 2015-11-13 1356    Subjective Pt reports she is doing alright. The right hip continues to feel better than a few weeks back. She has been working on her HEP daily.    Pertinent History HTN, CKD, history of seizures (medically managed), history of CA, macular degeneration, history of heart surgery in 1989, hernia R side    Currently in Pain? Yes   Pain Score 6    Pain Location Hip   Pain Orientation Right                         [facility] Adult PT Treatment/Exercise - November 13, 2015 0001    Knee/Hip  Exercises: Stretches   Passive Hamstring Stretch 2 reps;30 seconds;Both   Other Knee/Hip Stretches R hooklying internal rotation stretch   3x60sH   Other Knee/Hip Stretches R supine frog Pose stretch   3x60   Knee/Hip Exercises: Supine   Heel Slides 15 reps;Right;Limitations  on 2' step c twoel    Heel Slides Limitations Abduction Heel Slides  2x10 c Toe-in; 2" step (fatigued after 8x)   Bridges with [employer] 10 reps;2 sets   Straight Leg Raises 10 reps;Right;2 sets  weak, min-modA to perform   Other Supine Knee/Hip Exercises clam RTB x 10    Modalities   Modalities Moist Heat   Moist Heat Therapy   Moist Heat Location Hip  R posterior hip during stretches                  PT Short Term Goals - 10/11/15 1156    PT SHORT TERM GOAL #1   Title Patient to demonstrate correct posture at least 75% of the time in order to improve balance and improve mechanics    Time 4   Period Weeks   Status On-going   PT SHORT TERM GOAL #  2   Title Patient will perform TUG in less than 15 seconds with LRAD in order to demonstrate improved balance/reduced fall risk    Time 4   Period Weeks   Status On-going   PT SHORT TERM GOAL #3   Title Patient will demonstrate reduced muscle spasm by at least 60% in order to reduce pain and improve mechanics    Time 4   Period Weeks   Status On-going   PT SHORT TERM GOAL #4   Title Patient will consistently and correctly perform appropriate HEP, to be updated PRN    Time 4   Period Weeks   Status On-going           PT Long Term Goals - 10/11/15 1156    PT LONG TERM GOAL #1   Title Patient will be independent with all functional sit to stand and stand-pivot transfers with LRAD in order to improve overall mobiltiy    Time 8   Period Weeks   Status On-going   PT LONG TERM GOAL #2   Title Patient to demosntrate strength 4/5 in all tested groups in order to reduce pain and improve mobilty    Time 8   Period Weeks   Status On-going    PT LONG TERM GOAL #3   Title Patient will be able to ambulate and stand for at least 45 minutes with LRAD and pain no more than 3/10 in order to demonstrate overall improvement in condition    Time 8   Period Weeks   Status On-going   PT LONG TERM GOAL #4   Title Paitent to be independent in bed mobility in order to improve overall mobility and QOL at home    Time 8   Period Weeks   Status On-going               Plan - 11-09-15 1430    Clinical Impression Statement Pt tolerating session well today, able to perform more isolated strengthening, and more targets strengthening, with overall decrease in strength. Recommending clamshells be done with yellow tubing in future to decrease resistnace.  Progress toward all goals evident.    Rehab Potential Good   PT Frequency 2x / week   PT Duration 8 weeks   PT Treatment/Interventions ADLs/Self Care Home Management;Moist Heat;Gait training;Stair training;Functional mobility training;Therapeutic activities;Therapeutic exercise;Balance training;Neuromuscular re-education;Patient/family education;Manual techniques;Passive range of motion;Energy conservation;Taping   PT Next Visit Plan Reassessment:     Consulted and Agree with Plan of Care Patient      Patient will benefit from skilled therapeutic intervention in order to improve the following deficits and impairments:  Abnormal gait, Decreased endurance, Decreased activity tolerance, Decreased strength, Pain, Decreased balance, Decreased mobility, Difficulty walking, Increased muscle spasms, Improper body mechanics, Decreased coordination, Impaired flexibility, Postural dysfunction  Visit Diagnosis: Pain in right hip  Muscle weakness (generalized)  Difficulty in walking, not elsewhere classified  Unsteadiness on feet       G-Codes - 11/09/15 1432    Functional Assessment Tool Used Based on skilled clinical assessment of strength, functional mobility, gait, pain, balance     Functional Limitation Mobility: Walking and moving around   Mobility: Walking and Moving Around Current Status [id number]) At least 40 percent but less than 60 percent impaired, limited or restricted   Mobility: Walking and Moving Around Goal Status [phone]) At least 20 percent but less than 40 percent impaired, limited or restricted      Problem List Patient Active Problem List  Diagnosis Date Noted  . Tension headache 11/10/2013  . Chest pain 11/03/2011  . ARF (acute renal failure) ([place]) 04/30/2011  . UTI (urinary tract infection) 04/30/2011  . CKD (chronic kidney disease), stage IV ([place]) 04/30/2011  . Anemia 04/30/2011  . S/p nephrectomy 04/30/2011  . Macular degeneration 04/30/2011  . Cataract 04/30/2011  . Bladder cancer ([place]) 04/30/2011  . Weakness generalized 04/29/2011  . Seizure disorder ([place]) 04/29/2011  . Benign hypertension 04/29/2011  . CAD (coronary artery disease) 04/29/2011  . Hyperlipidemia 04/29/2011  . Anxiety 04/29/2011  . Dizziness - light-headed 04/29/2011  . Chronic abdominal pain 04/29/2011  . MIXED HYPERLIPIDEMIA 05/24/2008  . ESSENTIAL HYPERTENSION, BENIGN 05/24/2008  . Postsurgical aortocoronary bypass status 05/24/2008   2:33 PM, 10/26/2015 Etta Grandchild, PT, DPT Physical Therapist at [place] [phone] (office)     [place], [place], [address] Phone: [phone]   Fax:  [phone]  Name: [name] [name] MRN: [id number] Date of Birth: 15-Oct-1928

## 2015-10-30 ENCOUNTER — Ambulatory Visit (HOSPITAL_COMMUNITY): Payer: Medicare Other

## 2015-10-30 DIAGNOSIS — R262 Difficulty in walking, not elsewhere classified: Secondary | ICD-10-CM | POA: Diagnosis not present

## 2015-10-30 DIAGNOSIS — M6281 Muscle weakness (generalized): Secondary | ICD-10-CM

## 2015-10-30 DIAGNOSIS — M25551 Pain in right hip: Secondary | ICD-10-CM

## 2015-10-30 DIAGNOSIS — R2681 Unsteadiness on feet: Secondary | ICD-10-CM

## 2015-10-30 NOTE — Therapy (Signed)
[place], [place], [address] Phone: [phone]   Fax:  [phone]  Physical Therapy Treatment  Patient Details  Name: [name] [name] MRN: [id number] Date of Birth: 1928-08-22 Referring Provider: Rod Can  Encounter Date: 10/30/2015      PT End of Session - 10/30/15 1454    Visit Number 7   Number of Visits 16   Date for PT Re-Evaluation 2015/12/06  Reassesment done on 7/25   Authorization Type Medicare AARP Medicare Complete    Authorization Time Period 2015-10-06 to 12-06-2015 (G Codes done on 2022/11/06)   Authorization - Visit Number 7   Authorization - Number of Visits 20   PT Start Time 1435   PT Stop Time 1515   PT Time Calculation (min) 40 min   Activity Tolerance Patient tolerated treatment well   Behavior During Therapy [facility] for tasks assessed/performed      Past Medical History:  Diagnosis Date  . Back pain   . Bladder cancer ([place])   . CAD (coronary artery disease)   . Dizziness   . 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 Essential hypertension, benign   . Mixed hyperlipidemia   . Obesity   . Postsurgical aortocoronary bypass status   . Seizure [facility])     Past Surgical History:  Procedure Laterality Date  . CATARACT EXTRACTION    . CORONARY ARTERY BYPASS GRAFT    . IRIDECTOMY    . NEPHRECTOMY  2000    There were no vitals filed for this visit.      Subjective Assessment - 10/30/15 1442    Subjective Pt is excited to report that she has been pain free since Sunday. She was very sore after her last session as predicted by PT, but after 36hrs, all of her pain had resolved. She is excited to be feeling better, but antsy to DC from PT due to some concerns over financial hardship.    Pertinent History HTN, CKD, history of seizures (medically managed), history of CA, macular degeneration, history of heart surgery in 1989, hernia R side    Currently in Pain? No/denies            [facility] PT  Assessment - 10/30/15 0001      Assessment   Medical Diagnosis chornic hip pain    Referring Provider Aaron Edelman Swinteck   Onset Date/Surgical Date --  chronic   Next MD Visit not unless its needed    Prior Therapy None     Precautions   Precaution Comments R sided hernia      Balance Screen   Has the patient fallen in the past 6 months No   Has the patient had a decrease in activity level because of a fear of falling?  No   Is the patient reluctant to leave their home because of a fear of falling?  No     Strength   Right Hip Flexion 5/5  2/5 at eval   Right Hip ABduction 5/5  seated; 3/5 at eval   Left Hip Flexion 5/5  3/5 at eval   Left Hip ABduction 5/5  seated; 3/5 at eval   Right Knee Flexion 4+/5  2+/5 at eval   Right Knee Extension 5/5  3/5 at eval   Left Knee Flexion 4+/5  4/5 at eval   Left Knee Extension 4+/5  4-/5 eval   Right Ankle Dorsiflexion 5/5  3+/5 at eval   Left Ankle  Dorsiflexion 5/5  5/5 at eval     Flexibility   Hamstrings R SLR: 155*; L SLR 150*   At eval "severe tightness R, moderate tightness L'   Piriformis --  not reassessed.     Palpation   Palpation comment 25% improved  at eval "severe tightness noted R ITB, severe tenderness"     High Level Balance   High Level Balance Comments TUG: 16.37s; (at eva: 35.5s c SPC)  TUG:                      [facility] Adult PT Treatment/Exercise - 10/30/15 0001      Knee/Hip Exercises: Stretches   Passive Hamstring Stretch 2 reps;30 seconds;Both   Other Knee/Hip Stretches R hooklying internal rotation stretch   2x60sH   Other Knee/Hip Stretches R supine frog Pose stretch   2x60sH     Knee/Hip Exercises: Supine   Heel Slides Right;Limitations;20 reps  on 2' step c towel    Heel Slides Limitations Abduction Heel Slides  3x8 c Toe-in; 2" step    Bridges with [employer] 10 reps;2 sets  volleyball   Straight Leg Raises 10 reps;Right;2 sets  weak, min-modA to perform   Other Supine  Knee/Hip Exercises Supine clam c RedTB  unable to get to for time     Modalities   Modalities Moist Heat     Moist Heat Therapy   Moist Heat Location Hip  Concurrent with Supine exercises ~20 minutes                  PT Short Term Goals - 10/30/15 1527      PT SHORT TERM GOAL #1   Title Patient to demonstrate correct posture at least 75% of the time in order to improve balance and improve mechanics    Time 4   Period Weeks   Status On-going     PT SHORT TERM GOAL #2   Title Patient will perform TUG in less than 15 seconds with LRAD in order to demonstrate improved balance/reduced fall risk    Baseline 30+s at eval; <17s at reassessment.    Time 4   Period Weeks   Status On-going     PT SHORT TERM GOAL #3   Title Patient will demonstrate reduced muscle spasm by at least 60% in order to reduce pain and improve mechanics    Time 4   Period Weeks   Status On-going     PT SHORT TERM GOAL #4   Title Patient will consistently and correctly perform appropriate HEP, to be updated PRN    Time 4   Period Weeks   Status Achieved           PT Long Term Goals - 10/30/15 1528      PT LONG TERM GOAL #1   Title Patient will be independent with all functional sit to stand and stand-pivot transfers with LRAD in order to improve overall mobiltiy    Time 8   Period Weeks   Status Partially Met     PT LONG TERM GOAL #2   Title Patient to demosntrate strength 4/5 in all tested groups in order to reduce pain and improve mobilty    Time 8   Period Weeks   Status Achieved     PT LONG TERM GOAL #3   Title Patient will be able to ambulate and stand for at least 45 minutes with LRAD and pain no more than 3/10  in order to demonstrate overall improvement in condition    Time 8   Period Weeks   Status On-going     PT LONG TERM GOAL #4   Title Paitent to be independent in bed mobility in order to improve overall mobility and QOL at home    Time 8   Period Weeks   Status  On-going               Plan - 10/30/15 1522    Clinical Impression Statement Reassessment performed today, the pt demonstrating progress toward all goals. Strength testing as assessed with MMT is grossly 5/5 with a few minor exceptions (see detail below) which has increased considerably since evaluation. Pain has completely resolved at home, now for 3 days. Timed up and go test has vastly improved, now requiring ~50% of time as at evaluation. The pt has asked to end PT early if possible for financial reasons; we have agreed to move her to 1x weekly and will consider early DC as appropriate pending progress. All HEP will be reviewed next session and updated now that patient is able to tolerate much more physical activity.    Rehab Potential Good   PT Frequency 1x / week   PT Duration 8 weeks   PT Treatment/Interventions ADLs/Self Care Home Management;Moist Heat;Gait training;Stair training;Functional mobility training;Therapeutic activities;Therapeutic exercise;Balance training;Neuromuscular re-education;Patient/family education;Manual techniques;Passive range of motion;Energy conservation;Taping   PT Next Visit Plan Review findings from reassessment; review HEP and update as appropriate; HEP education with patient and family.    Consulted and Agree with Plan of Care Patient      Patient will benefit from skilled therapeutic intervention in order to improve the following deficits and impairments:  Abnormal gait, Decreased endurance, Decreased activity tolerance, Decreased strength, Pain, Decreased balance, Decreased mobility, Difficulty walking, Increased muscle spasms, Improper body mechanics, Decreased coordination, Impaired flexibility, Postural dysfunction  Visit Diagnosis: Pain in right hip  Muscle weakness (generalized)  Difficulty in walking, not elsewhere classified  Unsteadiness on feet     Problem List Patient Active Problem List   Diagnosis Date Noted  . Tension  headache 11/10/2013  . Chest pain 11/03/2011  . ARF (acute renal failure) ([place]) 04/30/2011  . UTI (urinary tract infection) 04/30/2011  . CKD (chronic kidney disease), stage IV ([place]) 04/30/2011  . Anemia 04/30/2011  . S/p nephrectomy 04/30/2011  . Macular degeneration 04/30/2011  . Cataract 04/30/2011  . Bladder cancer ([place]) 04/30/2011  . Weakness generalized 04/29/2011  . Seizure disorder ([place]) 04/29/2011  . Benign hypertension 04/29/2011  . CAD (coronary artery disease) 04/29/2011  . Hyperlipidemia 04/29/2011  . Anxiety 04/29/2011  . Dizziness - light-headed 04/29/2011  . Chronic abdominal pain 04/29/2011  . MIXED HYPERLIPIDEMIA 05/24/2008  . ESSENTIAL HYPERTENSION, BENIGN 05/24/2008  . Postsurgical aortocoronary bypass status 05/24/2008    3:32 PM, 10/30/15 Etta Grandchild, PT, DPT Physical Therapist at [place] [phone] (office)     [place], [place], [address] Phone: [phone]   Fax:  [phone]  Name: [name] [name] MRN: [id number] Date of Birth: 05/10/1928

## 2015-11-01 ENCOUNTER — Ambulatory Visit (HOSPITAL_COMMUNITY): Payer: Medicare Other | Admitting: Physical Therapy

## 2015-11-06 ENCOUNTER — Encounter (HOSPITAL_COMMUNITY): Payer: Self-pay | Admitting: Physical Therapy

## 2015-11-08 ENCOUNTER — Ambulatory Visit (HOSPITAL_COMMUNITY): Payer: Medicare Other | Attending: Orthopedic Surgery | Admitting: Physical Therapy

## 2015-11-08 DIAGNOSIS — M6281 Muscle weakness (generalized): Secondary | ICD-10-CM | POA: Diagnosis not present

## 2015-11-08 DIAGNOSIS — R262 Difficulty in walking, not elsewhere classified: Secondary | ICD-10-CM | POA: Diagnosis not present

## 2015-11-08 DIAGNOSIS — R2681 Unsteadiness on feet: Secondary | ICD-10-CM | POA: Diagnosis not present

## 2015-11-08 DIAGNOSIS — M25551 Pain in right hip: Secondary | ICD-10-CM

## 2015-11-08 NOTE — Therapy (Signed)
[place], [place], [address] Phone: [phone]   Fax:  [phone]  Physical Therapy Treatment (Discharge)  Patient Details  Name: [name] [name] MRN: [id number] Date of Birth: 07-31-1928 Referring Provider: Rod Can  Encounter Date: 11/08/2015      PT End of Session - 11/08/15 1739    Visit Number 8   Number of Visits 8   Authorization Type Medicare AARP Medicare Complete    Authorization Time Period 2015-10-19 to 2015-12-19 (G Codes done on November 19, 2022)   Authorization - Visit Number 8   Authorization - Number of Visits 20   PT Start Time 1505   PT Stop Time 1515   PT Time Calculation (min) 38 min   Activity Tolerance Patient tolerated treatment well   Behavior During Therapy [facility] for tasks assessed/performed      Past Medical History:  Diagnosis Date  . Back pain   . Bladder cancer ([place])   . CAD (coronary artery disease)   . Dizziness   . 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 Essential hypertension, benign   . Mixed hyperlipidemia   . Obesity   . Postsurgical aortocoronary bypass status   . Seizure [facility])     Past Surgical History:  Procedure Laterality Date  . CATARACT EXTRACTION    . CORONARY ARTERY BYPASS GRAFT    . IRIDECTOMY    . NEPHRECTOMY  2000    There were no vitals filed for this visit.      Subjective Assessment - 11/08/15 1738    Subjective Patient arrives very cheerful but a few minutes late today; she states she would like today to be her last session due to financial concerns. She can really do what she wants/needs to, the only thing that would be hard for her right now would be climbing a set of extended stairs. No falls  or close calls recently.    Pertinent History HTN, CKD, history of seizures (medically managed), history of CA, macular degeneration, history of heart surgery in 1989, hernia R side    Currently in Pain? No/denies                                  PT Education - 11/08/15 1738    Education provided Yes   Education Details DC today per patient request, extensive education provided regarding finalized/advanced HEP throughout entire session, educated regarding importance of ongoing and regualr physical activity    Person(s) Educated Patient   Methods Explanation   Comprehension Verbalized understanding          PT Short Term Goals - 10/30/15 1527      PT SHORT TERM GOAL #1   Title Patient to demonstrate correct posture at least 75% of the time in order to improve balance and improve mechanics    Time 4   Period Weeks   Status On-going     PT SHORT TERM GOAL #2   Title Patient will perform TUG in less than 15 seconds with LRAD in order to demonstrate improved balance/reduced fall risk    Baseline 30+s at eval; <17s at reassessment.    Time 4   Period Weeks   Status On-going     PT SHORT TERM GOAL #3   Title Patient will demonstrate reduced muscle spasm by at least 60% in order to reduce pain and improve mechanics    Time  4   Period Weeks   Status On-going     PT SHORT TERM GOAL #4   Title Patient will consistently and correctly perform appropriate HEP, to be updated PRN    Time 4   Period Weeks   Status Achieved           PT Long Term Goals - 10/30/15 1528      PT LONG TERM GOAL #1   Title Patient will be independent with all functional sit to stand and stand-pivot transfers with LRAD in order to improve overall mobiltiy    Time 8   Period Weeks   Status Partially Met     PT LONG TERM GOAL #2   Title Patient to demosntrate strength 4/5 in all tested groups in order to reduce pain and improve mobilty    Time 8   Period Weeks   Status Achieved     PT LONG TERM GOAL #3   Title Patient will be able to ambulate and stand for at least 45 minutes with LRAD and pain no more than 3/10 in order to demonstrate overall improvement in condition    Time 8    Period Weeks   Status On-going     PT LONG TERM GOAL #4   Title Paitent to be independent in bed mobility in order to improve overall mobility and QOL at home    Time 8   Period Weeks   Status On-going               Plan - Nov 10, 2015 1739    Clinical Impression Statement Patient arrived today stating she would really like today to be her last day due to financial concerns as well as transportation issues due to her daughter in law returning back to work for the school year, patient is not able to drive herself. Spent session discussing and going through prescribed exercises for use at home, which focused on progression of current exercises and posture, general functional training as well as balance. Patient able to complete all exercise successfully and states great satisfaction regarding her outcomes from PT. DC today due to patient request.    Rehab Potential Good   PT Next Visit Plan DC today due to financial concerns and loss of available transportation secondary to family returning to work for school year    Consulted and Agree with Plan of Care Patient      Patient will benefit from skilled therapeutic intervention in order to improve the following deficits and impairments:  Abnormal gait, Decreased endurance, Decreased activity tolerance, Decreased strength, Pain, Decreased balance, Decreased mobility, Difficulty walking, Increased muscle spasms, Improper body mechanics, Decreased coordination, Impaired flexibility, Postural dysfunction  Visit Diagnosis: Pain in right hip  Muscle weakness (generalized)  Difficulty in walking, not elsewhere classified  Unsteadiness on feet       G-Codes - 11/10/2015 1742    Functional Assessment Tool Used Based on skilled clinical assessment of strength, functional mobility, gait, pain, balance    Functional Limitation Mobility: Walking and moving around   Mobility: Walking and Moving Around Goal Status [phone]) At least 20 percent but  less than 40 percent impaired, limited or restricted   Mobility: Walking and Moving Around Discharge Status [phone]) At least 40 percent but less than 60 percent impaired, limited or restricted      Problem List Patient Active Problem List   Diagnosis Date Noted  . Tension headache 11/10/2013  . Chest pain 11/03/2011  . ARF (acute renal  failure) ([place]) 04/30/2011  . UTI (urinary tract infection) 04/30/2011  . CKD (chronic kidney disease), stage IV ([place]) 04/30/2011  . Anemia 04/30/2011  . S/p nephrectomy 04/30/2011  . Macular degeneration 04/30/2011  . Cataract 04/30/2011  . Bladder cancer ([place]) 04/30/2011  . Weakness generalized 04/29/2011  . Seizure disorder ([place]) 04/29/2011  . Benign hypertension 04/29/2011  . CAD (coronary artery disease) 04/29/2011  . Hyperlipidemia 04/29/2011  . Anxiety 04/29/2011  . Dizziness - light-headed 04/29/2011  . Chronic abdominal pain 04/29/2011  . MIXED HYPERLIPIDEMIA 05/24/2008  . ESSENTIAL HYPERTENSION, BENIGN 05/24/2008  . Postsurgical aortocoronary bypass status 05/24/2008   PHYSICAL THERAPY DISCHARGE SUMMARY  Visits from Start of Care: 8  Current functional level related to goals / functional outcomes: Patient arrives requesting DC due to financial concerns and loss of transportation to PT; she states she can do everything she needs and wants at this time and is quite satisfied with her progress.    Remaining deficits: Functional weakness, unsteadiness, poor posture, impaired flexibility    Education / Equipment: Updated HEP, importance of regular activity (patient refuses information for senior center/[employer]/walking club due to transportation issues).  Plan: Patient agrees to discharge.  Patient goals were partially met. Patient is being discharged due to financial reasons.  ?????      Deniece Ree PT, DPT [facility] [address] [place], [place], [address] Phone:  [phone]   Fax:  [phone]  Name: BETSEY SOSSAMON MRN: [id number] Date of Birth: 02/10/1929

## 2015-11-08 NOTE — Patient Instructions (Signed)
   BRIDGING (STAGGERED)  While lying on your back, tighten your lower abdominals, with one foot closer to your rear than the othersqueeze your buttocks and then raise your buttocks off the floor/bed as creating a "Bridge" with your body.  Repeat 10 times, then switch your feet, repeat 10 more times.  Repeat twice a day.   ELASTIC BAND - SUPINE HIP ABDUCTION  While lying on your back, slowly bring your leg out to the side. Keep  your knee straight the entire time.  Make sure you are turning your toes inwards rather than letting them swing out to the side.   Repeat 5-10 times each leg, twice a day.   SIT TO STAND - NO SUPPORT (WITH TRUNK EXTENSION)  Start by scooting close to the front of the chair.  Next, lean forward at your trunk and reach forward with your arms and rise to standing without using your hands to push off from the chair or other object.   Use your arms as a counter-balance by reaching forward when in sitting and lower them as you approach standing. Then extend your trunk back, getting yourself into the best posture that you can; you can stretch your arms overhead if needed to get a bigger stretch.  Repeat 5-10 times, twice a day.               SEATED FORWARD REACH FROM CHAIR  Reach as far forward as you can, like you are trying to pick something up off of the floor, then extend your trunk back upright with the best posture you can and stretching our with your arms.  Repeat 5-10 times, twice a day.    TANDEM STANCE WITH SUPPORT  Stand in front of a chair, table or counter top for support. Then place the heel of one foot so that it is touching the toes of the other foot. Maintain your balance in this position.   Hold for as long as you can, then switch feet. Repeat 2 time each side, twice a day.    SINGLE LEG STANCE - SLS  Stand on one leg and maintain your balance. Hold as long as you can, then repeat on the other side.   Repeat twice each  side, twice a day.

## 2015-11-13 ENCOUNTER — Encounter (HOSPITAL_COMMUNITY): Payer: Self-pay | Admitting: Physical Therapy

## 2015-11-15 ENCOUNTER — Encounter (HOSPITAL_COMMUNITY): Payer: Self-pay

## 2015-12-11 DIAGNOSIS — S39011A Strain of muscle, fascia and tendon of abdomen, initial encounter: Secondary | ICD-10-CM | POA: Diagnosis not present

## 2015-12-11 DIAGNOSIS — R296 Repeated falls: Secondary | ICD-10-CM | POA: Diagnosis not present

## 2015-12-18 ENCOUNTER — Emergency Department (HOSPITAL_COMMUNITY)
Admission: EM | Admit: 2015-12-18 | Discharge: 2015-12-18 | Disposition: A | Discharge status: Home / Self Care | Payer: Medicare Other | Attending: Emergency Medicine | Admitting: Emergency Medicine

## 2015-12-18 ENCOUNTER — Emergency Department (HOSPITAL_COMMUNITY): Payer: Medicare Other

## 2015-12-18 ENCOUNTER — Encounter (HOSPITAL_COMMUNITY): Payer: Self-pay | Admitting: Emergency Medicine

## 2015-12-18 DIAGNOSIS — M25552 Pain in left hip: Secondary | ICD-10-CM | POA: Diagnosis not present

## 2015-12-18 DIAGNOSIS — S4992XA Unspecified injury of left shoulder and upper arm, initial encounter: Secondary | ICD-10-CM | POA: Diagnosis not present

## 2015-12-18 DIAGNOSIS — M25522 Pain in left elbow: Secondary | ICD-10-CM | POA: Diagnosis not present

## 2015-12-18 DIAGNOSIS — S0990XA Unspecified injury of head, initial encounter: Secondary | ICD-10-CM | POA: Diagnosis not present

## 2015-12-18 DIAGNOSIS — W01198A Fall on same level from slipping, tripping and stumbling with subsequent striking against other object, initial encounter: Secondary | ICD-10-CM | POA: Insufficient documentation

## 2015-12-18 DIAGNOSIS — Y999 Unspecified external cause status: Secondary | ICD-10-CM | POA: Diagnosis not present

## 2015-12-18 DIAGNOSIS — S0101XA Laceration without foreign body of scalp, initial encounter: Secondary | ICD-10-CM | POA: Diagnosis not present

## 2015-12-18 DIAGNOSIS — Z23 Encounter for immunization: Secondary | ICD-10-CM | POA: Diagnosis not present

## 2015-12-18 DIAGNOSIS — Y92009 Unspecified place in unspecified non-institutional (private) residence as the place of occurrence of the external cause: Secondary | ICD-10-CM | POA: Insufficient documentation

## 2015-12-18 DIAGNOSIS — Z7982 Long term (current) use of aspirin: Secondary | ICD-10-CM | POA: Insufficient documentation

## 2015-12-18 DIAGNOSIS — M79602 Pain in left arm: Secondary | ICD-10-CM | POA: Diagnosis not present

## 2015-12-18 DIAGNOSIS — Z8551 Personal history of malignant neoplasm of bladder: Secondary | ICD-10-CM | POA: Diagnosis not present

## 2015-12-18 DIAGNOSIS — R0781 Pleurodynia: Secondary | ICD-10-CM | POA: Diagnosis not present

## 2015-12-18 DIAGNOSIS — I2510 Atherosclerotic heart disease of native coronary artery without angina pectoris: Secondary | ICD-10-CM | POA: Insufficient documentation

## 2015-12-18 DIAGNOSIS — Z79899 Other long term (current) drug therapy: Secondary | ICD-10-CM | POA: Diagnosis not present

## 2015-12-18 DIAGNOSIS — W19XXXA Unspecified fall, initial encounter: Secondary | ICD-10-CM

## 2015-12-18 DIAGNOSIS — S50312A Abrasion of left elbow, initial encounter: Secondary | ICD-10-CM | POA: Insufficient documentation

## 2015-12-18 DIAGNOSIS — T07XXXA Unspecified multiple injuries, initial encounter: Secondary | ICD-10-CM

## 2015-12-18 DIAGNOSIS — S40022A Contusion of left upper arm, initial encounter: Secondary | ICD-10-CM | POA: Diagnosis not present

## 2015-12-18 DIAGNOSIS — I10 Essential (primary) hypertension: Secondary | ICD-10-CM | POA: Diagnosis not present

## 2015-12-18 DIAGNOSIS — Y9301 Activity, walking, marching and hiking: Secondary | ICD-10-CM | POA: Diagnosis not present

## 2015-12-18 DIAGNOSIS — S299XXA Unspecified injury of thorax, initial encounter: Secondary | ICD-10-CM | POA: Diagnosis not present

## 2015-12-18 DIAGNOSIS — R079 Chest pain, unspecified: Secondary | ICD-10-CM | POA: Diagnosis not present

## 2015-12-18 DIAGNOSIS — S199XXA Unspecified injury of neck, initial encounter: Secondary | ICD-10-CM | POA: Diagnosis not present

## 2015-12-18 MED ORDER — TETANUS-DIPHTH-ACELL PERTUSSIS 5-2.5-18.5 LF-MCG/0.5 IM SUSP
0.5000 mL | Freq: Once | INTRAMUSCULAR | Status: AC
  Administered 2015-12-18: 0.5 mL via INTRAMUSCULAR
  Filled 2015-12-18: qty 0.5

## 2015-12-18 MED ORDER — OXYCODONE-ACETAMINOPHEN 5-325 MG PO TABS
1.0000 | ORAL_TABLET | Freq: Once | ORAL | Status: AC
  Administered 2015-12-18: 1 via ORAL
  Filled 2015-12-18: qty 1

## 2015-12-18 MED ORDER — ONDANSETRON 4 MG PO TBDP
4.0000 mg | ORAL_TABLET | Freq: Once | ORAL | Status: AC
  Administered 2015-12-18: 4 mg via ORAL
  Filled 2015-12-18: qty 1

## 2015-12-18 NOTE — ED Triage Notes (Signed)
Pt was walking in dark without cane and tripped. Pt c/o pain all over and has laceration to the left side of head. Pt denies any loc.

## 2015-12-18 NOTE — ED Notes (Signed)
Pt returned from CT °

## 2015-12-18 NOTE — ED Provider Notes (Signed)
TIME SEEN: 6:30 AM  CHIEF COMPLAINT: Fall, head injury, left arm, left rib, left hip pain  HPI: Pt is a 82 y.o. female with history of CAD, hypertension, hyperlipidemia, seizures on Keppra who presents to the emergency department after she fell at home. She reports that her cat was meowing and she got up to let her cat outside when she tripped and hit her left side of her head on the door and then fell to the ground on to her left arm and left hip. Denies that she lost consciousness.  She is not on anticoagulation, antiplatelets. Denies numbness, tingling or focal weakness. Denies shortness of breath, abdominal pain, back pain. Denies any preceding symptoms that led to her fall including chest pain, shortness of breath, dizziness. Reports prior to that she has been feeling well. No recent fevers, cough, vomiting or diarrhea.  PCP is Dr. Allyn Kenner  ROS: See HPI Constitutional: no fever  Eyes: no drainage  ENT: no runny nose   Cardiovascular:  no chest pain  Resp: no SOB  GI: no vomiting GU: no dysuria Integumentary: no rash  Allergy: no hives  Musculoskeletal: no leg swelling  Neurological: no slurred speech ROS otherwise negative  PAST MEDICAL HISTORY/PAST SURGICAL HISTORY:  Past Medical History:  Diagnosis Date  . Back pain   . Bladder cancer ([place])   . CAD (coronary artery disease)   . Dizziness   . 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 Essential hypertension, benign   . Mixed hyperlipidemia   . Obesity   . Postsurgical aortocoronary bypass status   . Seizure [facility])     MEDICATIONS:  Prior to Admission medications   Medication Sig Start Date End Date Taking? Authorizing Provider  ALPRAZolam Duanne Moron) 0.5 MG tablet Take 0.125-0.5 mg by mouth daily as needed for anxiety. For nerves    Historical Provider, MD  amLODipine (NORVASC) 5 MG tablet Take 5 mg by mouth daily.    Historical Provider, MD  aspirin EC 81 MG tablet Take 81 mg by mouth at bedtime.     Historical Provider, MD   BELSOMRA 20 MG TABS  12/15/14   Historical Provider, MD  CRESTOR 20 MG tablet Take 1 tablet by mouth at bedtime.  01/06/11   Historical Provider, MD  Diclofenac Sodium POWD  10/18/14   Historical Provider, MD  fentaNYL (DURAGESIC - DOSED MCG/HR) 25 MCG/HR patch  11/27/14   Historical Provider, MD  furosemide (LASIX) 20 MG tablet  12/12/14   Historical Provider, MD  gabapentin (NEURONTIN) 100 MG capsule Take 300 mg by mouth 2 (two) times daily.  04/14/13   Historical Provider, MD  HYDROcodone-acetaminophen (NORCO) 10-325 MG per tablet Take 1 tablet by mouth every 6 (six) hours as needed for moderate pain or severe pain.     Historical Provider, MD  l-methylfolate-B6-B12 (METANX) 3-35-2 MG TABS Take 1 tablet by mouth at bedtime. 10/27/13   Garvin Fila, MD  levETIRAcetam (KEPPRA) 250 MG tablet Take 1 tablet (250 mg total) by mouth 2 (two) times daily. 12/20/14   Garvin Fila, MD  levETIRAcetam (KEPPRA) 250 MG tablet TAKE ONE TABLET BY MOUTH TWICE DAILY 02/22/15   Garvin Fila, MD  LINZESS 145 MCG CAPS capsule  12/18/14   Historical Provider, MD  Multiple Vitamins-Minerals (ICAPS PO) Take 1 tablet by mouth at bedtime.    Historical Provider, MD  MYRBETRIQ 25 MG TB24 tablet  11/28/14   Historical Provider, MD  nitrofurantoin, macrocrystal-monohydrate, (MACROBID) 100 MG capsule  12/14/14   Historical Provider, MD  nitroGLYCERIN (NITROSTAT) 0.4 MG SL tablet Place 0.4 mg under the tongue every 5 (five) minutes as needed for chest pain.    Historical Provider, MD  NON FORMULARY 1-2 application by Pump Prime route 3 (three) times daily. COMPOUND CREAM: DIC/BAC/GAB/LID/PR/MEN    Historical Provider, MD  nystatin (MYCOSTATIN) powder  10/26/14   Historical Provider, MD  nystatin cream (MYCOSTATIN)  09/12/14   Historical Provider, MD  VOLTAREN 1 % GEL Apply 2 g topically daily as needed (Muscle Pain).  08/20/12   Historical Provider, MD    ALLERGIES:  Allergies  Allergen Reactions  . Adhesive [Tape] Other (See  Comments)    Tears patients skin off.   [name] [Cephalexin] Nausea And Vomiting  . Latex Itching  . Penicillins Hives and Itching  . Sulfa Antibiotics Hives and Itching    SOCIAL HISTORY:  Social History  Substance Use Topics  . Smoking status: Never Smoker  . Smokeless tobacco: Never Used  . Alcohol use No    FAMILY HISTORY: Family History  Problem Relation Age of Onset  . Colon cancer Mother   . Heart attack Father   . Diabetes Father   . Stroke    . Coronary artery disease    . Cancer    . Diabetes      EXAM: BP 156/64 (BP Location: Left Arm)   Pulse 76   Temp 98.2 F (36.8 C)   Resp 20   Ht 5\' 2"  (1.575 m)   Wt 170 lb (77.1 kg)   SpO2 96%   BMI 31.09 kg/m  CONSTITUTIONAL: Alert and oriented and responds appropriately to questions. Well-appearing; well-nourished; GCS 15, Elderly HEAD: Normocephalic; Superficial 0.5 cm superficial laceration that is hemostatic noted to the left scalp with some scalp tenderness but no obvious sign of skull fracture EYES: Conjunctivae clear, PERRL, EOMI ENT: normal nose; no rhinorrhea; moist mucous membranes; pharynx without lesions noted; no dental injury; no septal hematoma NECK: Supple, no meningismus, no LAD; no midline spinal tenderness, step-off or deformity, tender over the left lateral neck CARD: RRR; S1 and S2 appreciated; no murmurs, no clicks, no rubs, no gallops RESP: Normal chest excursion without splinting or tachypnea; breath sounds clear and equal bilaterally; no wheezes, no rhonchi, no rales; no hypoxia or respiratory distress CHEST:  chest wall stable, no crepitus or ecchymosis or deformity, minimally tender to palpation over the left lateral ribs ABD/GI: Normal bowel sounds; non-distended; soft, non-tender, no rebound, no guarding PELVIS:  stable, nontender to palpation BACK:  The back appears normal and is non-tender to palpation, there is no CVA tenderness; no midline spinal tenderness, step-off or  deformity EXT: Ecchymosis and abrasion noted to the left upper arm as well as tenderness along the humerus without obvious deformity.  Abrasion and tenderness to the left elbow without joint effusion. Tender to palpation over the left hip without leg length discrepancy. No obvious deformity to the left hip. No tenderness over the left forearm, wrist or hand. No tenderness over the left knee, tibia or fibula, foot or ankle. 2+ radial and DP pulses bilaterally. Normal ROM in all joints; otherwise extremity's are non-tender to palpation; no edema; normal capillary refill; no cyanosis, no bony tenderness or bony deformity of patient's extremities, no joint effusion, no ecchymosis or lacerations    SKIN: Normal color for age and race; warm, superficial laceration to the left scalp, abrasions to the left arm and left elbow NEURO: Moves all extremities equally,  sensation to light touch intact diffusely, cranial nerves II through XII intact PSYCH: The patient's mood and manner are appropriate. Grooming and personal hygiene are appropriate.  MEDICAL DECISION MAKING: Patient here with mechanical fall. Will obtain CT of her head, cervical spine, x-rays of her humerus, elbow, hip and left ribs. She does take Vicodin chronically at home for pain and did not get her dose this morning. States she thinks she needs something stronger. We'll give her Percocet. She is unsure if her last tetanus vaccination. Will update this vaccination today.  ED PROGRESS: Patient's imaging shows no acute abnormality. Pain has improved after Percocet. She is ambulatory with minimal assistance. Uses a cane at baseline. Discussed head injury return precautions with patient and her daughter-in-law who is at the bedside. They state they have plenty of hydrocodone at home to take as needed for pain. Discussed return precautions.  At this time, I do not feel there is any life-threatening condition present. I have reviewed and discussed all results  (EKG, imaging, lab, urine as appropriate), exam findings with patient/family. I have reviewed nursing notes and appropriate previous records.  I feel the patient is safe to be discharged home without further emergent workup and can continue workup as an outpatient as needed. Discussed usual and customary return precautions. Patient/family verbalize understanding and are comfortable with this plan.  Outpatient follow-up has been provided. All questions have been answered.      [place], DO 12/18/15 [phone]

## 2015-12-20 ENCOUNTER — Ambulatory Visit (INDEPENDENT_AMBULATORY_CARE_PROVIDER_SITE_OTHER): Payer: Medicare Other | Admitting: Neurology

## 2015-12-20 ENCOUNTER — Encounter: Payer: Self-pay | Admitting: Neurology

## 2015-12-20 VITALS — BP 127/62 | HR 67 | Ht 62.0 in | Wt 176.0 lb

## 2015-12-20 DIAGNOSIS — R569 Unspecified convulsions: Secondary | ICD-10-CM

## 2015-12-20 MED ORDER — LEVETIRACETAM 250 MG PO TABS: 250.0000 mg | ORAL_TABLET | Freq: Two times a day (BID) | ORAL | 3 refills | Status: DC

## 2015-12-20 NOTE — Progress Notes (Signed)
[facility]  PATIENT: [name] [name] DOB: [birth year]   HISTORY FROM: patient, chart REASON FOR VISIT: follow up  HISTORY OF PRESENT ILLNESS:  [name] is a 82 year old right-handed Caucasian female with a history of persistent right frontal and parietal superficial cortical lesion of undetermined etiology despite brain biopsy. She had a questionable partial seizure at the time of the biopsy. She was placed on Keppra but has had difficulty with tolerance and has been excessively sleepy during the day with his medication dose was increased. She is currently taking only 250 mg per day. She admits to poor sleep. She has difficulty going to sleep and staying asleep. She is a long-standing problem with dizziness and sense of imbalance. She's been told to be careful when changing positions because of orthostatic hypotension. She has trouble walking, needs to have a cane or person assistance to walk. She says she has been told she has some vertigo in the past. New neurologic complaints. No seizure activity since last seen. ROS.  UPDATE 10/19/2012: Patient returns for followup since last visit on 10/20/2011. No reported changes since last visit. No new neurologic complaints. No seizure activity since last seen. UPDATE 11/10/2013 : She returns for followup after last visit a year ago. She continues to do well without recurrent seizures now for nearly 5 years. She is taking Keppra 250 mg orally at night. She has a new complaint of mild headaches that she is having once or twice per week. She describes this mainly in the occipital region and occasionally over the vertex does headache pressure like in quality. She takes one or 2 Tylenol tablets which works quite well. She denies significant neck pain or radicular pain or arthritis. She has no other new complaints. Update 9/14/206 :SShe has recurrent UTI`s .he returns for f/u after last visit 1 year ago accompanied by daughter.She continues to  do well without recurrent seizures. She was unable to get keppra Xr from her insurance hence has been taking keppra 250 mg twice daily instead and tolerating it. She lives with son and daughter in law and is independent in ADLs but uses cane for walk and has to be careful with her balance and had 1 fall 1 year ago. But none since then. Update 12/20/2015 ; she returns for follow-up after last visit a year ago. She is accompanied by her daughter-in-law. She continues to do well without recurrent seizures or any other episodes. She has had a few falls and was seen in the emergency room on 06/28/15 as well as 12/18/15. CT scan of the head & neck  and x-rays are unremarkable. These falls occur mainly in the dark when she is not careful when getting up and walking. She does use a cane usually and is careful. She is tolerating Keppra 250 mg twice daily without any side effects. She has no new complaints. REVIEW OF SYSTEMS: Full 14 system review of systems performed and notable only for:  Eye discharge, shortness of breath, constipation, frequent waking, daytime sleepiness, incontinence of bladder, frequency of urination, back pain, walking difficulty, depression, anxiety, nervousness and all the systems negative     ALLERGIES: Allergies  Allergen Reactions  . Adhesive [Tape] Other (See Comments)    Tears patients skin off.   [name] [Cephalexin] Nausea And Vomiting  . Latex Itching  . Penicillins Hives and Itching  . Sulfa Antibiotics Hives and Itching    HOME MEDICATIONS: Outpatient Medications Prior to Visit  Medication Sig  Dispense Refill  . ALPRAZolam (XANAX) 0.5 MG tablet Take 0.125-0.5 mg by mouth daily as needed for anxiety. For nerves    . amLODipine (NORVASC) 5 MG tablet Take 5 mg by mouth daily.    Marland Kitchen aspirin EC 81 MG tablet Take 81 mg by mouth at bedtime.     . BELSOMRA 20 MG TABS     . CRESTOR 20 MG tablet Take 1 tablet by mouth at bedtime.     . Diclofenac Sodium POWD     . fentaNYL  (DURAGESIC - DOSED MCG/HR) 25 MCG/HR patch     . furosemide (LASIX) 20 MG tablet     . gabapentin (NEURONTIN) 100 MG capsule Take 300 mg by mouth 2 (two) times daily.     Marland Kitchen HYDROcodone-acetaminophen (NORCO) 10-325 MG per tablet Take 1 tablet by mouth every 6 (six) hours as needed for moderate pain or severe pain.     Marland Kitchen l-methylfolate-B6-B12 (METANX) 3-35-2 MG TABS Take 1 tablet by mouth at bedtime. 30 tablet 0  . LINZESS 145 MCG CAPS capsule     . Multiple Vitamins-Minerals (ICAPS PO) Take 1 tablet by mouth at bedtime.    Marland Kitchen MYRBETRIQ 25 MG TB24 tablet     . nitroGLYCERIN (NITROSTAT) 0.4 MG SL tablet Place 0.4 mg under the tongue every 5 (five) minutes as needed for chest pain.    . NON FORMULARY 1-2 application by Pump Prime route 3 (three) times daily. COMPOUND CREAM: DIC/BAC/GAB/LID/PR/MEN    . nystatin (MYCOSTATIN) powder     . nystatin cream (MYCOSTATIN)     . VOLTAREN 1 % GEL Apply 2 g topically daily as needed (Muscle Pain).     Marland Kitchen levETIRAcetam (KEPPRA) 250 MG tablet TAKE ONE TABLET BY MOUTH TWICE DAILY 180 tablet 3  . nitrofurantoin, macrocrystal-monohydrate, (MACROBID) 100 MG capsule     . levETIRAcetam (KEPPRA) 250 MG tablet Take 1 tablet (250 mg total) by mouth 2 (two) times daily. (Patient not taking: Reported on 12/20/2015) 180 tablet 4   No facility-administered medications prior to visit.     PAST MEDICAL HISTORY: Past Medical History:  Diagnosis Date  . Back pain   . Bladder cancer ([place])   . CAD (coronary artery disease)   . Dizziness   . 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 Essential hypertension, benign   . Mixed hyperlipidemia   . Obesity   . Postsurgical aortocoronary bypass status   . Seizure ([place])     PAST SURGICAL HISTORY: Past Surgical History:  Procedure Laterality Date  . CATARACT EXTRACTION    . CORONARY ARTERY BYPASS GRAFT    . IRIDECTOMY    . NEPHRECTOMY  2000    FAMILY HISTORY: Family History  Problem Relation Age of Onset  . Colon cancer  Mother   . Heart attack Father   . Diabetes Father   . Stroke    . Coronary artery disease    . Cancer    . Diabetes      SOCIAL HISTORY: Social History   Social History  . Marital status: Widowed    Spouse name: N/A  . Number of children: 3  . Years of education: N/A   Occupational History  .      n/a   Social History Main Topics  . Smoking status: Never Smoker  . Smokeless tobacco: Never Used  . Alcohol use No  . Drug use: No  . Sexual activity: No   Other Topics Concern  . Not  on file   Social History Narrative   Patient lives at home with his son and daughter in [employer].   Caffeine Use: 2 cups of caffeine daily.      PHYSICAL EXAM  Vitals:   12/20/15 1456  BP: 127/62  BP Location: Right Arm  Patient Position: Sitting  Cuff Size: Small  Pulse: 67  Weight: 176 lb (79.8 kg)  Height: 5\' 2"  (1.575 m)   Body mass index is 32.19 kg/m.  Generalized: frail elderly Caucasian lady, seated, and no evidence of stress   Neck: Supple, no carotid bruits   Cardiac: Regular rate rhythm, no murmur   Pulmonary: Clear to auscultation bilaterally large midline sternal lipoma  Musculoskeletal: No deformity   Neurological examination   Mentation: Alert oriented to time, place, history taking, language fluent, and causual conversation  Cranial nerve II-XII: Pupils were equal round reactive to light extraocular movements were full, visual field were full on confrontational test. facial sensation and strength were normal. hearing was intact to finger rubbing bilaterally. Uvula tongue midline. head turning and shoulder shrug and were normal and symmetric.Tongue protrusion into cheek strength was normal. Neck flexion and extension normal. MOTOR: normal bulk and tone, full strength on the right, 4/5 on the left. Diminished grip on left. no pronator drift SENSORY: normal and symmetric to light touch, pinprick, temperature, vibration and proprioception COORDINATION:  finger-nose-finger, heel-to-shin bilaterally, rapid alternating movement is normal on the right, slower on left REFLEXES: 1+ and symmetric GAIT/STATION: Rises from chair with use of hands. Stance is broad-based. Hemiparetic gait. ambulates with cane.   ASSESSMENT AND PLAN Ms. Takenya Travaglini is a 82 year old right-handed Caucasian female with a history of persistent right frontal and parietal superficial cortical lesion of undetermined etiology despite brain biopsy. She had a questionable partial seizure at the time of the biopsy. She is on low-dose Keppra, 250 mg per day.  EEG 06/21/2008 revealed right central epileptiform discharges. She remains stable without seizures PLAN :I had a long discussion with the patient and her daughter-in-law regarding her simple partial seizures which seem to be quite well controlled on the current low dose of Keppra. Continue Keppra and the current dose of keppra 250 mg twice daily. Patient was given a refill for 1 year.  she was also counseled about fall and safety precautions. Return for follow-up in a year or call earlier if necessary.    Antony Contras, MD  12/20/2015, 5:24 PM  [facility] [address], [place], [place] [address] [phone]

## 2015-12-20 NOTE — Patient Instructions (Signed)
I had a long discussion with the patient and her daughter-in-law regarding her simple partial seizures which seem to be quite well controlled on the current low dose of Keppra. Continue Keppra and the current dose of keppra 250 mg twice daily. Patient was given a refill for 1 year.  she was also counseled about fall and safety precautions. Return for follow-up in a year or call earlier if necessary.  Fall Prevention in the Home  Falls can cause injuries. They can happen to people of all ages. There are many things you can do to make your home safe and to help prevent falls.  WHAT CAN I DO ON THE OUTSIDE OF MY HOME?  Regularly fix the edges of walkways and driveways and fix any cracks.  Remove anything that might make you trip as you walk through a door, such as a raised step or threshold.  Trim any bushes or trees on the path to your home.  Use bright outdoor lighting.  Clear any walking paths of anything that might make someone trip, such as rocks or tools.  Regularly check to see if handrails are loose or broken. Make sure that both sides of any steps have handrails.  Any raised decks and porches should have guardrails on the edges.  Have any leaves, snow, or ice cleared regularly.  Use sand or salt on walking paths during winter.  Clean up any spills in your garage right away. This includes oil or grease spills. WHAT CAN I DO IN THE BATHROOM?   Use night lights.  Install grab bars by the toilet and in the tub and shower. Do not use towel bars as grab bars.  Use non-skid mats or decals in the tub or shower.  If you need to sit down in the shower, use a plastic, non-slip stool.  Keep the floor dry. Clean up any water that spills on the floor as soon as it happens.  Remove soap buildup in the tub or shower regularly.  Attach bath mats securely with double-sided non-slip rug tape.  Do not have throw rugs and other things on the floor that can make you trip. WHAT CAN I DO IN  THE BEDROOM?  Use night lights.  Make sure that you have a light by your bed that is easy to reach.  Do not use any sheets or blankets that are too big for your bed. They should not hang down onto the floor.  Have a firm chair that has side arms. You can use this for support while you get dressed.  Do not have throw rugs and other things on the floor that can make you trip. WHAT CAN I DO IN THE KITCHEN?  Clean up any spills right away.  Avoid walking on wet floors.  Keep items that you use a lot in easy-to-reach places.  If you need to reach something above you, use a strong step stool that has a grab bar.  Keep electrical cords out of the way.  Do not use floor polish or wax that makes floors slippery. If you must use wax, use non-skid floor wax.  Do not have throw rugs and other things on the floor that can make you trip. WHAT CAN I DO WITH MY STAIRS?  Do not leave any items on the stairs.  Make sure that there are handrails on both sides of the stairs and use them. Fix handrails that are broken or loose. Make sure that handrails are as long as the  stairways.  Check any carpeting to make sure that it is firmly attached to the stairs. Fix any carpet that is loose or worn.  Avoid having throw rugs at the top or bottom of the stairs. If you do have throw rugs, attach them to the floor with carpet tape.  Make sure that you have a light switch at the top of the stairs and the bottom of the stairs. If you do not have them, ask someone to add them for you. WHAT ELSE CAN I DO TO HELP PREVENT FALLS?  Wear shoes that:  Do not have high heels.  Have rubber bottoms.  Are comfortable and fit you well.  Are closed at the toe. Do not wear sandals.  If you use a stepladder:  Make sure that it is fully opened. Do not climb a closed stepladder.  Make sure that both sides of the stepladder are locked into place.  Ask someone to hold it for you, if possible.  Clearly mark and  make sure that you can see:  Any grab bars or handrails.  First and last steps.  Where the edge of each step is.  Use tools that help you move around (mobility aids) if they are needed. These include:  Canes.  Walkers.  Scooters.  Crutches.  Turn on the lights when you go into a dark area. Replace any light bulbs as soon as they burn out.  Set up your furniture so you have a clear path. Avoid moving your furniture around.  If any of your floors are uneven, fix them.  If there are any pets around you, be aware of where they are.  Review your medicines with your doctor. Some medicines can make you feel dizzy. This can increase your chance of falling. Ask your doctor what other things that you can do to help prevent falls.   This information is not intended to replace advice given to you by your health care provider. Make sure you discuss any questions you have with your health care provider.   Document Released: 01/18/2009 Document Revised: 08/08/2014 Document Reviewed: 04/28/2014 Elsevier Interactive Patient Education [employer].

## 2016-01-21 DIAGNOSIS — D509 Iron deficiency anemia, unspecified: Secondary | ICD-10-CM | POA: Diagnosis not present

## 2016-01-21 DIAGNOSIS — E782 Mixed hyperlipidemia: Secondary | ICD-10-CM | POA: Diagnosis not present

## 2016-01-21 DIAGNOSIS — R7301 Impaired fasting glucose: Secondary | ICD-10-CM | POA: Diagnosis not present

## 2016-01-24 DIAGNOSIS — R109 Unspecified abdominal pain: Secondary | ICD-10-CM | POA: Diagnosis not present

## 2016-01-24 DIAGNOSIS — G894 Chronic pain syndrome: Secondary | ICD-10-CM | POA: Diagnosis not present

## 2016-01-24 DIAGNOSIS — R42 Dizziness and giddiness: Secondary | ICD-10-CM | POA: Diagnosis not present

## 2016-01-24 DIAGNOSIS — N184 Chronic kidney disease, stage 4 (severe): Secondary | ICD-10-CM | POA: Diagnosis not present

## 2016-01-24 DIAGNOSIS — Z23 Encounter for immunization: Secondary | ICD-10-CM | POA: Diagnosis not present

## 2016-01-24 DIAGNOSIS — H3530 Unspecified macular degeneration: Secondary | ICD-10-CM | POA: Diagnosis not present

## 2016-02-06 DIAGNOSIS — R42 Dizziness and giddiness: Secondary | ICD-10-CM | POA: Diagnosis not present

## 2016-02-06 DIAGNOSIS — R296 Repeated falls: Secondary | ICD-10-CM | POA: Diagnosis not present

## 2016-02-06 DIAGNOSIS — G894 Chronic pain syndrome: Secondary | ICD-10-CM | POA: Diagnosis not present

## 2016-02-06 DIAGNOSIS — H3530 Unspecified macular degeneration: Secondary | ICD-10-CM | POA: Diagnosis not present

## 2016-03-13 DIAGNOSIS — H353134 Nonexudative age-related macular degeneration, bilateral, advanced atrophic with subfoveal involvement: Secondary | ICD-10-CM | POA: Diagnosis not present

## 2016-03-13 DIAGNOSIS — H35363 Drusen (degenerative) of macula, bilateral: Secondary | ICD-10-CM | POA: Diagnosis not present

## 2016-03-13 DIAGNOSIS — H353212 Exudative age-related macular degeneration, right eye, with inactive choroidal neovascularization: Secondary | ICD-10-CM | POA: Diagnosis not present

## 2016-03-13 DIAGNOSIS — H353222 Exudative age-related macular degeneration, left eye, with inactive choroidal neovascularization: Secondary | ICD-10-CM | POA: Diagnosis not present

## 2016-04-02 ENCOUNTER — Ambulatory Visit (HOSPITAL_COMMUNITY)
Admission: RE | Admit: 2016-04-02 | Discharge: 2016-04-02 | Disposition: A | Discharge status: Home / Self Care | Payer: Medicare Other | Source: Ambulatory Visit | Attending: Internal Medicine | Admitting: Internal Medicine

## 2016-04-02 ENCOUNTER — Other Ambulatory Visit (HOSPITAL_COMMUNITY): Payer: Self-pay | Admitting: Internal Medicine

## 2016-04-02 DIAGNOSIS — M545 Low back pain: Secondary | ICD-10-CM

## 2016-04-02 DIAGNOSIS — I700 Atherosclerosis of aorta: Secondary | ICD-10-CM | POA: Insufficient documentation

## 2016-04-02 DIAGNOSIS — M546 Pain in thoracic spine: Secondary | ICD-10-CM | POA: Diagnosis not present

## 2016-04-02 DIAGNOSIS — M5441 Lumbago with sciatica, right side: Secondary | ICD-10-CM | POA: Diagnosis not present

## 2016-04-04 ENCOUNTER — Emergency Department (HOSPITAL_COMMUNITY)
Admission: EM | Admit: 2016-04-04 | Discharge: 2016-04-04 | Disposition: A | Discharge status: Home / Self Care | Payer: Medicare Other | Attending: Emergency Medicine | Admitting: Emergency Medicine

## 2016-04-04 ENCOUNTER — Emergency Department (HOSPITAL_COMMUNITY): Payer: Medicare Other

## 2016-04-04 DIAGNOSIS — I2510 Atherosclerotic heart disease of native coronary artery without angina pectoris: Secondary | ICD-10-CM | POA: Diagnosis not present

## 2016-04-04 DIAGNOSIS — N390 Urinary tract infection, site not specified: Secondary | ICD-10-CM

## 2016-04-04 DIAGNOSIS — Z79899 Other long term (current) drug therapy: Secondary | ICD-10-CM | POA: Insufficient documentation

## 2016-04-04 DIAGNOSIS — R531 Weakness: Secondary | ICD-10-CM

## 2016-04-04 DIAGNOSIS — E876 Hypokalemia: Secondary | ICD-10-CM | POA: Diagnosis not present

## 2016-04-04 DIAGNOSIS — E860 Dehydration: Secondary | ICD-10-CM | POA: Diagnosis not present

## 2016-04-04 DIAGNOSIS — N184 Chronic kidney disease, stage 4 (severe): Secondary | ICD-10-CM | POA: Insufficient documentation

## 2016-04-04 DIAGNOSIS — J9811 Atelectasis: Secondary | ICD-10-CM | POA: Diagnosis not present

## 2016-04-04 DIAGNOSIS — I129 Hypertensive chronic kidney disease with stage 1 through stage 4 chronic kidney disease, or unspecified chronic kidney disease: Secondary | ICD-10-CM | POA: Insufficient documentation

## 2016-04-04 DIAGNOSIS — Z7982 Long term (current) use of aspirin: Secondary | ICD-10-CM | POA: Insufficient documentation

## 2016-04-04 DIAGNOSIS — R4781 Slurred speech: Secondary | ICD-10-CM | POA: Insufficient documentation

## 2016-04-04 LAB — COMPREHENSIVE METABOLIC PANEL
ALBUMIN: 3.9 g/dL (ref 3.5–5.0)
ALK PHOS: 63 U/L (ref 38–126)
ALT: 17 U/L (ref 14–54)
AST: 30 U/L (ref 15–41)
Anion gap: 7 (ref 5–15)
BILIRUBIN TOTAL: 0.7 mg/dL (ref 0.3–1.2)
BUN: 29 mg/dL — AB (ref 6–20)
CALCIUM: 9.1 mg/dL (ref 8.9–10.3)
CO2: 29 mmol/L (ref 22–32)
CREATININE: 2.45 mg/dL — AB (ref 0.44–1.00)
Chloride: 99 mmol/L — ABNORMAL LOW (ref 101–111)
GFR calc Af Amer: 20 mL/min — ABNORMAL LOW (ref >60)
GFR calc non Af Amer: 17 mL/min — ABNORMAL LOW (ref >60)
GLUCOSE: 130 mg/dL — AB (ref 65–99)
Potassium: 3.2 mmol/L — ABNORMAL LOW (ref 3.5–5.1)
Sodium: 135 mmol/L (ref 135–145)
TOTAL PROTEIN: 7.7 g/dL (ref 6.5–8.1)

## 2016-04-04 LAB — URINALYSIS, ROUTINE W REFLEX MICROSCOPIC
BILIRUBIN URINE: NEGATIVE
GLUCOSE, UA: NEGATIVE mg/dL
KETONES UR: NEGATIVE mg/dL
Nitrite: POSITIVE — AB
PROTEIN: 100 mg/dL — AB
Specific Gravity, Urine: 1.025 (ref 1.005–1.030)
pH: 5.5 (ref 5.0–8.0)

## 2016-04-04 LAB — CBC WITH DIFFERENTIAL/PLATELET
BASOS ABS: 0.1 10*3/uL (ref 0.0–0.1)
BASOS PCT: 1 %
Eosinophils Absolute: 0.7 10*3/uL (ref 0.0–0.7)
Eosinophils Relative: 8 %
HEMATOCRIT: 40.8 % (ref 36.0–46.0)
HEMOGLOBIN: 13 g/dL (ref 12.0–15.0)
LYMPHS PCT: 30 %
Lymphs Abs: 2.6 10*3/uL (ref 0.7–4.0)
MCH: 29.4 pg (ref 26.0–34.0)
MCHC: 31.9 g/dL (ref 30.0–36.0)
MCV: 92.3 fL (ref 78.0–100.0)
MONOS PCT: 9 %
Monocytes Absolute: 0.8 10*3/uL (ref 0.1–1.0)
NEUTROS ABS: 4.5 10*3/uL (ref 1.7–7.7)
NEUTROS PCT: 52 %
Platelets: 198 10*3/uL (ref 150–400)
RBC: 4.42 MIL/uL (ref 3.87–5.11)
RDW: 16.2 % — ABNORMAL HIGH (ref 11.5–15.5)
WBC: 8.6 10*3/uL (ref 4.0–10.5)

## 2016-04-04 LAB — URINALYSIS, MICROSCOPIC (REFLEX)

## 2016-04-04 MED ORDER — CEFPODOXIME PROXETIL 200 MG PO TABS
200.0000 mg | ORAL_TABLET | Freq: Once | ORAL | Status: AC
  Administered 2016-04-04: 200 mg via ORAL
  Filled 2016-04-04: qty 1

## 2016-04-04 MED ORDER — POTASSIUM CHLORIDE CRYS ER 20 MEQ PO TBCR
40.0000 meq | EXTENDED_RELEASE_TABLET | Freq: Once | ORAL | Status: AC
  Administered 2016-04-04: 40 meq via ORAL
  Filled 2016-04-04: qty 2

## 2016-04-04 MED ORDER — SODIUM CHLORIDE 0.9 % IV BOLUS (SEPSIS)
1000.0000 mL | Freq: Once | INTRAVENOUS | Status: AC
  Administered 2016-04-04: 1000 mL via INTRAVENOUS

## 2016-04-04 MED ORDER — SODIUM CHLORIDE 0.9 % IV BOLUS (SEPSIS)
500.0000 mL | Freq: Once | INTRAVENOUS | Status: AC
  Administered 2016-04-04: 500 mL via INTRAVENOUS

## 2016-04-04 MED ORDER — POTASSIUM CHLORIDE CRYS ER 20 MEQ PO TBCR: 40.0000 meq | EXTENDED_RELEASE_TABLET | Freq: Every day | ORAL | 0 refills | Status: DC

## 2016-04-04 MED ORDER — CEFPODOXIME PROXETIL 200 MG PO TABS: 200.0000 mg | ORAL_TABLET | Freq: Two times a day (BID) | ORAL | 0 refills | Status: DC

## 2016-04-04 NOTE — ED Notes (Signed)
Dr. Phill Myron second ekg Done:  15:21 Dr. Gearldine Bienenstock ekg

## 2016-04-04 NOTE — ED Notes (Signed)
Pt sheets wet. Changed sheets for pt and repositioned pt.

## 2016-04-04 NOTE — ED Notes (Signed)
MD Mesner notified pt is requesting pain medication.

## 2016-04-04 NOTE — ED Triage Notes (Signed)
Daughter in law, found pt in floor, states she was crawling around on the floor, state she was to weak to get up, Pt takes medication of back pain. Family saw her take it at 2pm.  Pt states she had not taken anything early. Pt is very sleepy in triage, words slurred

## 2016-04-04 NOTE — ED Provider Notes (Signed)
[facility] DEPT Provider Note   CSN: [id number] Arrival date & time: 04/04/16  1438     History   Chief Complaint Chief Complaint  Patient presents with  . Weakness    HPI [name] [name] is a 82 y.o. female.  82 yo F w/ h/o CAD, bladder CA, dizziness, seizures and HTN here with weakness. Had squatted down to pick up something and subsequently couldn't stand back up so just 'plopped down' on her butt and couldn't get up. Did not hit head or pass out.  History of falls, but no history of overall weakness.  No recent fevers, cough, falls. But has had increased urinary frequency lately. No CP or SOB with this.  No other associated or modifying factors.       Past Medical History:  Diagnosis Date  . Back pain   . Bladder cancer ([place])   . CAD (coronary artery disease)   . Dizziness   . 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 Essential hypertension, benign   . Mixed hyperlipidemia   . Obesity   . Postsurgical aortocoronary bypass status   . Seizure [facility])     Patient Active Problem List   Diagnosis Date Noted  . Tension headache 11/10/2013  . Chest pain 11/03/2011  . ARF (acute renal failure) ([place]) 04/30/2011  . UTI (urinary tract infection) 04/30/2011  . CKD (chronic kidney disease), stage IV ([place]) 04/30/2011  . Anemia 04/30/2011  . S/p nephrectomy 04/30/2011  . Macular degeneration 04/30/2011  . Cataract 04/30/2011  . Bladder cancer ([place]) 04/30/2011  . Weakness generalized 04/29/2011  . Seizure disorder ([place]) 04/29/2011  . Benign hypertension 04/29/2011  . CAD (coronary artery disease) 04/29/2011  . Hyperlipidemia 04/29/2011  . Anxiety 04/29/2011  . Dizziness - light-headed 04/29/2011  . Chronic abdominal pain 04/29/2011  . MIXED HYPERLIPIDEMIA 05/24/2008  . ESSENTIAL HYPERTENSION, BENIGN 05/24/2008  . Postsurgical aortocoronary bypass status 05/24/2008    Past Surgical History:  Procedure Laterality Date  . CATARACT EXTRACTION    . CORONARY ARTERY  BYPASS GRAFT    . IRIDECTOMY    . NEPHRECTOMY  2000    OB History    Gravida Para Term Preterm AB Living   3 3 3     3    SAB TAB Ectopic Multiple Live Births                   Home Medications    Prior to Admission medications   Medication Sig Start Date End Date Taking? Authorizing Provider  ALPRAZolam Duanne Moron) 0.5 MG tablet Take 0.125-0.5 mg by mouth 2 (two) times daily as needed for anxiety. For nerves   Yes Historical Provider, MD  amLODipine (NORVASC) 5 MG tablet Take 5 mg by mouth daily.   Yes Historical Provider, MD  aspirin EC 81 MG tablet Take 81 mg by mouth at bedtime.    Yes Historical Provider, MD  AZO-CRANBERRY PO Take 2 capsules by mouth every morning.   Yes Historical Provider, MD  BELSOMRA 20 MG TABS Take 20 mg by mouth at bedtime.  12/15/14  Yes Historical Provider, MD  compounded topicals builder Apply 1 application topically 3 (three) times daily as needed. Guaif/cyclo/menth/ CREAM   Yes Historical Provider, MD  CRESTOR 20 MG tablet Take 1 tablet by mouth at bedtime.  01/06/11  Yes Historical Provider, MD  Diclofenac Sodium POWD Apply topically daily as needed.  04/04/16  Yes Historical Provider, MD  docusate sodium (COLACE) 100 MG capsule Take 200  mg by mouth every morning.   Yes Historical Provider, MD  fentaNYL (DURAGESIC - DOSED MCG/HR) 25 MCG/HR patch Place 25 mcg onto the skin every 3 (three) days.  11/27/14  Yes Historical Provider, MD  furosemide (LASIX) 20 MG tablet Take 20 mg by mouth every other day.  12/12/14  Yes Historical Provider, MD  gabapentin (NEURONTIN) 300 MG capsule Take 300 mg by mouth 2 (two) times daily.  11/20/15  Yes Historical Provider, MD  HYDROcodone-acetaminophen (NORCO) 10-325 MG per tablet Take 1 tablet by mouth every 4 (four) hours as needed for moderate pain or severe pain.    Yes Historical Provider, MD  l-methylfolate-B6-B12 (METANX) 3-35-2 MG TABS tablet Take 1 tablet by mouth at bedtime.   Yes Historical Provider, MD  levETIRAcetam  (KEPPRA) 250 MG tablet Take 1 tablet (250 mg total) by mouth 2 (two) times daily. 12/20/15  Yes Garvin Fila, MD  LINZESS 145 MCG CAPS capsule Take 145 mcg by mouth at bedtime.  12/18/14  Yes Historical Provider, MD  Magnesium 250 MG TABS Take 250 mg by mouth at bedtime.    Yes Historical Provider, MD  Multiple Vitamins-Minerals (ICAPS PO) Take 1 tablet by mouth at bedtime.   Yes Historical Provider, MD  MYRBETRIQ 25 MG TB24 tablet Take 25 mg by mouth at bedtime.  11/28/14  Yes Historical Provider, MD  nitroGLYCERIN (NITROSTAT) 0.4 MG SL tablet Place 0.4 mg under the tongue every 5 (five) minutes as needed for chest pain.   Yes Historical Provider, MD  NON FORMULARY 1-2 application by Pump Prime route 3 (three) times daily. COMPOUND CREAM: DIC/BAC/GAB/LID/PR/MEN   Yes Historical Provider, MD  psyllium (HYDROCIL/METAMUCIL) 95 % PACK Take 1 packet by mouth every other day.   Yes Historical Provider, MD  VOLTAREN 1 % GEL Apply 2 g topically daily as needed (Muscle Pain).  08/20/12  Yes Historical Provider, MD  cefpodoxime (VANTIN) 200 MG tablet Take 1 tablet (200 mg total) by mouth 2 (two) times daily. 04/04/16   Merrily Pew, MD  potassium chloride SA (K-DUR,KLOR-CON) 20 MEQ tablet Take 2 tablets (40 mEq total) by mouth daily. 04/04/16 04/11/16  Merrily Pew, MD    Family History Family History  Problem Relation Age of Onset  . Colon cancer Mother   . Heart attack Father   . Diabetes Father   . Stroke    . Coronary artery disease    . Cancer    . Diabetes      Social History Social History  Substance Use Topics  . Smoking status: Never Smoker  . Smokeless tobacco: Never Used  . Alcohol use No     Allergies   Adhesive [tape]; Keflet [cephalexin]; Latex; Penicillins; and Sulfa antibiotics   Review of Systems Review of Systems  All other systems reviewed and are negative.    Physical Exam Updated Vital Signs BP 153/69   Pulse 65   Temp 98.1 F (36.7 C)   Resp 11   Ht 5\' 3"   (1.6 m)   Wt 180 lb (81.6 kg)   SpO2 97%   BMI 31.89 kg/m   Physical Exam  Constitutional: She is oriented to person, place, and time. She appears well-developed and well-nourished.  HENT:  Head: Normocephalic and atraumatic.  Mouth/Throat: Mucous membranes are dry.  Eyes: Conjunctivae and EOM are normal.  Neck: Normal range of motion.  Cardiovascular: Normal rate and regular rhythm.   Pulmonary/Chest: No stridor. No respiratory distress. She has rales (R>L).  Abdominal: She  exhibits no distension.  Neurological: She is alert and oriented to person, place, and time.  No altered mental status, able to give full seemingly accurate history.  Face is symmetric, EOM's intact, pupils equal and reactive, vision intact, tongue and uvula midline without deviation Upper and Lower extremity motor 5/5, intact pain perception in distal extremities, 2+ reflexes in biceps, patella and achilles tendons.   Nursing note and vitals reviewed.    ED Treatments / Results  Labs (all labs ordered are listed, but only abnormal results are displayed) Labs Reviewed  CBC WITH DIFFERENTIAL/PLATELET - Abnormal; Notable for the following:       Result Value   RDW 16.2 (*)    All other components within normal limits  COMPREHENSIVE METABOLIC PANEL - Abnormal; Notable for the following:    Potassium 3.2 (*)    Chloride 99 (*)    Glucose, Bld 130 (*)    BUN 29 (*)    Creatinine, Ser 2.45 (*)    GFR calc non Af Amer 17 (*)    GFR calc Af Amer 20 (*)    All other components within normal limits  URINALYSIS, ROUTINE W REFLEX MICROSCOPIC - Abnormal; Notable for the following:    APPearance HAZY (*)    Hgb urine dipstick MODERATE (*)    Protein, ur 100 (*)    Nitrite POSITIVE (*)    Leukocytes, UA MODERATE (*)    All other components within normal limits  URINALYSIS, MICROSCOPIC (REFLEX) - Abnormal; Notable for the following:    Bacteria, UA MANY (*)    Squamous Epithelial / LPF 0-5 (*)    All other  components within normal limits    EKG  EKG Interpretation  Date/Time:  Friday April 04 2016 15:21:59 EST Ventricular Rate:  68 PR Interval:    QRS Duration: 97 QT Interval:  433 QTC Calculation: 461 R Axis:   -13 Text Interpretation:  Sinus rhythm Low voltage, precordial leads No significant change since last tracing Confirmed by [facility] MD, Corene Cornea [id number]) on 04/04/2016 4:09:06 PM       Radiology Dg Chest 2 View  Result Date: 04/04/2016 CLINICAL DATA:  Weakness. EXAM: CHEST  2 VIEW COMPARISON:  Radiographs of December 18, 2015. FINDINGS: Stable cardiomegaly. Sternotomy wires are noted. No pneumothorax or significant pleural effusion is noted. Mild bibasilar subsegmental atelectasis is noted. Bony thorax is unremarkable. IMPRESSION: Mild bibasilar subsegmental atelectasis.  Stable cardiomegaly. Electronically Signed   By: Marijo Conception, M.D.   On: 04/04/2016 16:23   Ct Head Wo Contrast  Result Date: 04/04/2016 CLINICAL DATA:  Patient weakness. Family states she was found crawling on the floor today to weak to stand. Slurred speech started today. EXAM: CT HEAD WITHOUT CONTRAST TECHNIQUE: Contiguous axial images were obtained from the base of the skull through the vertex without intravenous contrast. COMPARISON:  12/17/2005 FINDINGS: Brain: The ventricles are normal in configuration. There is ventricular sulcal enlargement reflecting mild generalized atrophy. No hydrocephalus. There are no parenchymal masses or mass effect. There is no evidence a cortical infarct. An old lacune infarct extends from the right base a ganglia to the right centrum semiovale. There are no extra-axial masses or abnormal fluid collections. There is no intracranial hemorrhage. Vascular: No hyperdense vessel or unexpected calcification. Skull: There is an old small right frontoparietal craniotomy flap, stable from the prior study. No other skull lesion. Sinuses/Orbits: Globes and orbits are unremarkable.  Visualize globes and orbits are unremarkable. Visualized sinuses and mastoid air cells are  clear. Other:  None IMPRESSION: 1. No acute intracranial abnormalities. 2. Atrophy and old right basal ganglia to centrum semiovale lacune infarct. Status post right frontoparietal craniotomy. Electronically Signed   By: Lajean Manes M.D.   On: 04/04/2016 17:07    Procedures Procedures (including critical care time)  Medications Ordered in ED Medications  sodium chloride 0.9 % bolus 500 mL (0 mLs Intravenous Stopped 04/04/16 1622)  sodium chloride 0.9 % bolus 1,000 mL (0 mLs Intravenous Stopped 04/04/16 1749)  potassium chloride SA (K-DUR,KLOR-CON) CR tablet 40 mEq (40 mEq Oral Given 04/04/16 1623)  cefpodoxime (VANTIN) tablet 200 mg (200 mg Oral Given 04/04/16 1912)     Initial Impression / Assessment and Plan / ED Course  I have reviewed the triage vital signs and the nursing notes.  Pertinent labs & imaging results that were available during my care of the patient were reviewed by me and considered in my medical decision making (see chart for details).  Clinical Course    Suspect dehtydration v infection.  No focal deficits to suggest CVA.   Weakness likely combo of UTI/dehydration. Still slightly sleepy at discharge. Discussed admission with family however she lives with her daughter in law who preferred and felt comfortable taking care of her at home. Antibiotics started in ED. Had a fluid bolus. Will follow up with PCP next week for recheck of K and creatinine. otherwise will return here if any worsening or if noti mproving in 2-3 days. Culture sent (started on vantin)  Final Clinical Impressions(s) / ED Diagnoses   Final diagnoses:  Weakness  Urinary tract infection without hematuria, site unspecified  Dehydration  Hypokalemia    New Prescriptions Discharge Medication List as of 04/04/2016  8:01 PM    START taking these medications   Details  cefpodoxime (VANTIN) 200 MG tablet  Take 1 tablet (200 mg total) by mouth 2 (two) times daily., Starting Fri 04/04/2016, Print    potassium chloride SA (K-DUR,KLOR-CON) 20 MEQ tablet Take 2 tablets (40 mEq total) by mouth daily., Starting Fri 04/04/2016, Until Fri 04/11/2016, Print         Merrily Pew, MD 04/05/16 1031

## 2016-04-04 NOTE — ED Notes (Signed)
Pts linens changed at this time

## 2016-04-04 NOTE — ED Notes (Signed)
Per pt's family member pt took one 25-325 Norco tablet at 1400. Pt also has Fentanyl patch that needs to be changed today, pt requesting pain medication.

## 2016-04-04 NOTE — ED Notes (Signed)
Pt unable to provide urine specimen at this time

## 2016-04-14 DIAGNOSIS — E876 Hypokalemia: Secondary | ICD-10-CM | POA: Diagnosis not present

## 2016-04-14 DIAGNOSIS — N183 Chronic kidney disease, stage 3 (moderate): Secondary | ICD-10-CM | POA: Diagnosis not present

## 2016-04-14 DIAGNOSIS — E860 Dehydration: Secondary | ICD-10-CM | POA: Diagnosis not present

## 2016-04-14 DIAGNOSIS — R531 Weakness: Secondary | ICD-10-CM | POA: Diagnosis not present

## 2016-04-14 DIAGNOSIS — N390 Urinary tract infection, site not specified: Secondary | ICD-10-CM | POA: Diagnosis not present

## 2016-05-26 DIAGNOSIS — M7601 Gluteal tendinitis, right hip: Secondary | ICD-10-CM | POA: Diagnosis not present

## 2016-05-26 DIAGNOSIS — G894 Chronic pain syndrome: Secondary | ICD-10-CM | POA: Diagnosis not present

## 2016-06-24 ENCOUNTER — Encounter: Payer: Self-pay | Admitting: *Deleted

## 2016-06-24 ENCOUNTER — Other Ambulatory Visit: Payer: Self-pay | Admitting: *Deleted

## 2016-06-24 DIAGNOSIS — R296 Repeated falls: Secondary | ICD-10-CM | POA: Insufficient documentation

## 2016-06-24 NOTE — Patient Outreach (Signed)
[name] [name]) Care Management  4/50/3888  [name] [name] [birth year] [id number]   Call received from Dr. Wende Neighbors who referred [name] [name] to [facility] because of concerns about level of care needs.   [name] [name] was living with her son and daughter in law until very recently when she moved in with her sister Lavella [name] ([phone]). When she first moved there, Hospice did an evaluation but did not initiate services. When I spoke with [name] [name], sister and primary caregiver today, she told me that when [name] [name] was living with her son/daughter in law, she was "taking twice as much medicine like pain pills and Xanax as I am letting her have". Of note, [name] [name] indicates that [name] [name] is taking "hydrocodone" tablets as often as 5 times daily. I do not see Hydrocodone on her physician prescribed medication list. [name] [name] reports that since she took over medication management, [name] [name] is more alert and oriented and is not having falls. [name] [name] states that [name] [name]'s son and daughter in law tried to help with medication management but [name] [name] would not allow them to help.   According to [name] [name], [name] [name] is agreeable to help with transitioning to long term skilled facility care. [name] [name] had a good experience at [facility] after a hip replacement surgery and states her sister is interested in this facility. I asked Alene Mires, practice administrator for Dr. Nevada Crane to fax [facility] to [facility] to begin bed search. I reached out to [name] [name] [phone] who told me that [name] [name] has [employer] Medicare and partial Medicaid. She agreed to get in contact with Mrs. Ollinger's Medicaid worker to begin process of changing to long term care Medicaid.   Plan: I have referred Mrs. Steward to our social work team for assistance with placement needs. I will follow up with Mrs. Kelner and the family by phone this week. I anticipate face to  face visit next week.    [facility]  [phone]

## 2016-06-24 NOTE — Telephone Encounter (Signed)
This encounter was created in error - please disregard.

## 2016-06-25 ENCOUNTER — Encounter: Payer: Self-pay | Admitting: *Deleted

## 2016-06-25 ENCOUNTER — Other Ambulatory Visit: Payer: Self-pay | Admitting: *Deleted

## 2016-06-25 NOTE — Patient Outreach (Addendum)
[name] [name] ([facility])) Care Management  9/51/8841  [name] [name] [birth year] [id number]  [name] [name] is an 83 year old female with medical history which includes coronary artery disease,  bladder cancer, dizziness, seizures, hypertension, hyperlipidemia, and frequent falls who last presented to the ED in December 2017 with weakness.   [name] [name] has been living with her son and daughter in law in [place] until very recently when she moved in with her sister in [place]. [name] [name] was referred to [facility] on 06/24/16 for assistance with level of care concerns, falls, and chronic disease management.   I reached out to [name] [name] yesterday at the home of her sister but was unable to speak with her as she was resting. I spoke with her sister, [name] [name] ([phone]) and with her daughter in law, [name] [name] [phone].   [name] [name] explained that since she has taken over medication administration and decreased her daily dose of prn Xanax and Hydrocodone, [name] [name] has experienced no further falls and has been more alert and coherent. [name] [name] was careful to point out that [name] [name]'s son and daughter in law went to great effort to manage [name] [name] medications but [name] [name] often would not allow them to have access to her medications.    According to [name] [name], [name] [name] is agreeable to help with transitioning to long term skilled facility care. [name] [name] had a good experience at [name] after a hip replacement surgery and states her sister is interested in this facility. [name] [name], Engineer, building services for Dr. Nevada Crane faxed an [facility] to [facility] to begin a bed search. [name] [name]'s daughter in law [name] [name] agreed to get in contact with [name] [name] Medicaid worker to begin process of changing to long term care Medicaid.   I called the home of [name] [name] again today ans spoke directly with [name] [name]. She agrees to [facility] involvement in her care and to long term facility placement and says she does not want to go to any facility in [place] but wishes to find placement in [place] where she will be near her 3 sisters and where her son works and can come by easily to visit her.   Plan: I will collaborate with my social work Social worker about [name] [name] needs and will follow up with [name] [name] by phone. In addition, I will plan a face to face visit with [name] [name] next week for further assessment and planning.   [facility] CM Care Plan Problem One     Most Recent Value  Care Plan Problem One  Knowledge Deficits and Care Coordination Needs related to Level of Care Concerns as evidenced by provider referall and concerns and family requests for assistance  Role Documenting the Problem One  Care Management [place] for Problem One  Active  [facility]N Long Term Goal (31-90 days)  Over the next 31 days, patient/family members will verbalize understanding of plan of care to address current level of care concerns and patient care needs  [name] Long Term Goal Start Date  06/24/16  Interventions for Problem One Long Term Goal  discussed care needs, plan of care, MD recommendations, and placement process with patient by phone,  collaborated with LCSW  [facility]N CM Short Term Goal #1 (0-30 days)  Over the next 7 days, patient daughter in law will assist iwth Medicaid process (change to long term care)  [facility] CM Short  Term Goal #1 Start Date  06/24/16  Interventions for Short Term Goal #1  notified patient that dtr in law is working on [employer] process and answered her questions about same  THN CM Short Term Goal #2 (0-30 days)  Over the next 14 days, patient/family will choose from available beds at facilities for long term care  [name] CM Short Term Goal #2 Start Date  06/24/16  Interventions for Short Term Goal #2  discussed with patient the "bed offer" and "bed selection" process when searching for long term care,  addressed  patient questions     [facility]  [phone]

## 2016-06-26 ENCOUNTER — Other Ambulatory Visit: Payer: Self-pay | Admitting: *Deleted

## 2016-06-26 NOTE — Patient Outreach (Signed)
[place] [facility]) Care Management  8/65/7846  [name] [name] [birth year] [id number]   Phone call to patient and patient's daughter in law Frederich Chick to discuss placement process and community resource needs.  Patient gave verbal permission for this social worker to discuss placement needs with daughter in law. Per Malachy Mood, the [facility] has been completed by patient's provider's office.    Plan: This social worker will request the FL2 from patient's provider's office and will begin the process of faxing it to potential facilities for bed offers.    [name] [facility] Care Management [phone]

## 2016-06-30 ENCOUNTER — Other Ambulatory Visit: Payer: Self-pay | Admitting: *Deleted

## 2016-06-30 NOTE — Patient Outreach (Addendum)
[facility]) Care Management  0/45/9136  [name] [name] [birth year] [id number]   Phone call to patient's provider's office to request a copy of patient's FL2 to assist with identifying a long term placement per patient and families request.  Spoke with [name] [name] who was not able to locate the form. Per [name] [name] is usually the person that would complete this and she is off today but will return tomorrow.   Plan:  This social worker will call [name] [name] at patient's provider's office to request the [place] on 07/01/16.     [name] [name] [facility] Care Management [phone]

## 2016-07-01 ENCOUNTER — Other Ambulatory Visit: Payer: Self-pay | Admitting: *Deleted

## 2016-07-01 NOTE — Patient Outreach (Signed)
[facility]) Care Management  1/47/0929  [name] [name] [birth year] [id number]   Phone call to patient's primary care doctor's office. FL2 requested, [facility] fax number provided.   Plan: This social worker will send FL2 to ALF once received to obtain available bed offers.    [name] [facility] Care Management [phone]

## 2016-07-10 ENCOUNTER — Encounter: Payer: Self-pay | Admitting: *Deleted

## 2016-07-10 ENCOUNTER — Other Ambulatory Visit: Payer: Self-pay | Admitting: *Deleted

## 2016-07-10 NOTE — Patient Outreach (Signed)
[facility]) Care Management  05/13/2033  [name] [name] [birth year] [id number]   Phone call to patient's provider's office to request that patient's FL2 to be faxed to this social worker as it has not been received. Voicemail message left for a return call.    [name] [facility] Care Management [phone]

## 2016-07-10 NOTE — Patient Outreach (Signed)
[facility]) Care Management  04/13/7114  [name] [name] [birth year] [id number]   Phone call to patient's providers office to request the Fl2 be faxed. RNCM Joellyn Quails was present at the doctor's office and will be able to bring the FL2 to the office.  This Education officer, museum spoke with patient and her sister to provide the above update. Patient gave verbal permission to speak to her sister [name] [name] and son [name] [name] about the placement process. Per patient, she would like to be the first line of communication but would not mind if information was shared with patient's sister [name] [name] and son [name] [name]. Per patient, she continues to be in agreement with the plan for long term care after placement process was discussed.  Patient was assured that she would continue to have choice regarding any placement decisions that needed to be made.   Plan: This Education officer, museum will begin to send out Fl2 to ALF for placement purposes.    [name] [name] [facility] Care Management [phone]

## 2016-07-11 ENCOUNTER — Telehealth: Payer: Self-pay | Admitting: *Deleted

## 2016-07-11 NOTE — Patient Outreach (Signed)
[facility]) Care Management  07/06/9377  [name] [name] [birth year] [id number]  Care Coordination/Telephone Assessment  During a visit on 07/10/16 to Dr [name] [name] office, [name] [name] spoke with [name] [name], Glass blower/designer, about progress of pt facility bed search. FL2 provided by [name] [name] for [facility] CM to take to [place], [employer].  [name] [name] had sent FL2 to various [facility] but [name] [name] has moved from [place] to [address], [place] to stay with her sister, [name] [name].  TH[facility] CM called to speak with [name] [name] and sister, [name] [name] to discuss the progress of facility placement bed search.and to schedule a home visit for July 18 2016 at 2 pm.  [name] [name] reports she is doing "okay" without any complaints and reports has an appointment on April 13. 2018 with pcp.  She is noted to have and appointment with Neurologist, [name] [name] on 01/01/17.  [name] [name] confirmed pt was contacted on 07/10/16 by [facility] SW.  States letters were received from [facility] and brought to the home by [name] [name] and she did not understand it.  THN CM explained to [name] [name] and [name] [name] after the letters were read to CM that the information at the end of the letter was a general disclaimer.  They voiced understanding   Plan: to see [name] [name] on next week for home visit.

## 2016-07-14 ENCOUNTER — Encounter: Payer: Self-pay | Admitting: *Deleted

## 2016-07-14 ENCOUNTER — Other Ambulatory Visit: Payer: Self-pay | Admitting: *Deleted

## 2016-07-14 NOTE — Patient Outreach (Addendum)
[place] [facility]) Care Management  10/09/516  KHRISTY KALAN [birth year] [id number]   Phone call to [facility]. Spoke to Admissions Director Barb Merino regarding possible placement for patient.  FL2 faxed to [facility] for her review.    [name] [facility] Care Management [phone]

## 2016-07-16 ENCOUNTER — Other Ambulatory Visit: Payer: Self-pay | Admitting: *Deleted

## 2016-07-16 NOTE — Patient Outreach (Signed)
[place] [facility]) Care Management  0/98/1191  [name] [name] [birth year] [id number]   Phone call to Admissions Coordinator Barb Merino at [facility] to follow up on SNF authorization. Per Angela Nevin, patient was not authorized for skilled care and may need Assisted Living.   Plan: This social worker will discuss possible ALF placement with patient and her family. This social worker will request another FL2 from her providers office.   [name] [facility] Care Management [phone]

## 2016-07-17 ENCOUNTER — Other Ambulatory Visit: Payer: Self-pay | Admitting: *Deleted

## 2016-07-17 NOTE — Patient Outreach (Signed)
[place] [facility]) Care Management  3/88/8757  KORIANNA WASHER [birth year] [id number]   Phone call to Dr. Durene Cal office, spoke with Golda Acre to confirm that patient was not authorized  for skilled level and would need assisted living. New FL2 requested.   Plan: FL2 to be picked up on Tuesday 07/22/16.    [name] [facility] Care Management [phone]

## 2016-07-17 NOTE — Patient Outreach (Signed)
[place] [facility]) Care Management   2/53/6644  ELLIS KOFFLER [birth year] [id number]  [name] [name] is an 83 y.o. female with medical history which includes coronary artery disease,  bladder cancer, dizziness, seizures, hypertension, hyperlipidemia, and frequent falls who last presented to the ED in December 2017 with weakness.   Mrs. Rojero had been living with her son and daughter in law in [place] until very recently when she moved in with her sister in [place]. Mrs. Kithcart was referred to [facility] [facility] on 06/24/16 for assistance with level of care concerns, falls, and chronic disease management.   Contact had been made to her sister, [name] ([phone]) and with her daughter in law, Mrs. Frederich Chick [phone].   Mrs. Mariea Clonts explained that since she has taken over medication administration and decreased her daily dose of prn Xanax and Hydrocodone, Mrs. Grobe has experienced no further falls and has been more alert and coherent. Mrs. Mariea Clonts was careful to point out that Mrs. Recchia's son and daughter in law went to great effort to manage Mrs. Minjares's medications but Mrs. Hirschmann often would not allow them to have access to her medications.    Subjective:   Objective:  BP 124/70 (BP Location: Right Arm, Patient Position: Sitting, Cuff Size: Large)   Pulse 68   Resp 18   SpO2 95%    Review of Systems  Constitutional: Positive for malaise/fatigue. Negative for chills, diaphoresis, fever and weight loss.  HENT: Negative for congestion, ear discharge, ear pain, nosebleeds, sinus pain, sore throat and tinnitus.   Eyes: Negative for blurred vision, double vision, photophobia, pain, discharge and redness.  Respiratory: Negative for cough, hemoptysis, sputum production, shortness of breath, wheezing and stridor.   Cardiovascular: Negative for palpitations, orthopnea, claudication, leg swelling and PND.  Gastrointestinal: Positive for abdominal pain, constipation,  diarrhea and nausea. Negative for blood in stool, melena and vomiting.  Genitourinary: Positive for frequency. Negative for dysuria, flank pain, hematuria and urgency.  Musculoskeletal: Positive for myalgias. Negative for back pain, falls, joint pain and neck pain.  Skin: Negative for itching and rash.  Neurological: Positive for dizziness. Negative for tingling, tremors, sensory change, speech change, focal weakness, seizures, loss of consciousness, weakness and headaches.  Endo/Heme/Allergies: Negative for environmental allergies and polydipsia. Bruises/bleeds easily.  Psychiatric/Behavioral: Negative for depression, hallucinations, substance abuse and suicidal ideas. The patient is nervous/anxious. The patient does not have insomnia.     Physical Exam  Constitutional: She is oriented to person, place, and time. She appears well-developed and well-nourished.  HENT:  Head: Normocephalic and atraumatic.  Nose: Nose normal.  Eyes: Conjunctivae are normal. Pupils are equal, round, and reactive to light.  Neck: Normal range of motion. Neck supple.  Cardiovascular: Normal rate, regular rhythm, normal heart sounds and intact distal pulses.   Respiratory: Effort normal and breath sounds normal.  GI: She exhibits distension.  Musculoskeletal: Normal range of motion.  Neurological: She is alert and oriented to person, place, and time.  Skin: Skin is warm and dry.  Psychiatric: She has a normal mood and affect. Her behavior is normal. Judgment and thought content normal.    Encounter Medications:   Outpatient Encounter Prescriptions as of 07/17/2016  Medication Sig Note  . ALPRAZolam (XANAX) 0.5 MG tablet Take 0.125-0.5 mg by mouth 2 (two) times daily as needed for anxiety. For nerves 06/24/2016: Sister now managing this medication and reports that patient is taking less daily since she began management  . amLODipine (NORVASC)  5 MG tablet Take 5 mg by mouth daily.   Marland Kitchen aspirin EC 81 MG tablet Take  81 mg by mouth at bedtime.    . AZO-CRANBERRY PO Take 2 capsules by mouth every morning.   . BELSOMRA 20 MG TABS Take 20 mg by mouth at bedtime.  12/20/2014: Received from: External Pharmacy  . compounded topicals builder Apply 1 application topically 3 (three) times daily as needed. Guaif/cyclo/menth/ CREAM   . CRESTOR 20 MG tablet Take 1 tablet by mouth at bedtime.    . docusate sodium (COLACE) 100 MG capsule Take 200 mg by mouth every morning.   . fentaNYL (DURAGESIC - DOSED MCG/HR) 25 MCG/HR patch Place 25 mcg onto the skin every 3 (three) days.  04/04/2016: Patch currently being worn is "old" per family and is due to be removed today 04/04/2016  . furosemide (LASIX) 20 MG tablet Take 20 mg by mouth every other day.  12/20/2014: Received from: External Pharmacy  . gabapentin (NEURONTIN) 300 MG capsule Take 300 mg by mouth 2 (two) times daily.  12/20/2015: Received from: External Pharmacy  . HYDROcodone-acetaminophen (NORCO) 10-325 MG per tablet Take 1 tablet by mouth every 4 (four) hours as needed for moderate pain or severe pain.    Marland Kitchen l-methylfolate-B6-B12 (METANX) 3-35-2 MG TABS tablet Take 1 tablet by mouth at bedtime.   . levETIRAcetam (KEPPRA) 250 MG tablet Take 1 tablet (250 mg total) by mouth 2 (two) times daily.   Marland Kitchen LINZESS 145 MCG CAPS capsule Take 145 mcg by mouth at bedtime.  12/20/2014: Received from: External Pharmacy  . Magnesium 250 MG TABS Take 250 mg by mouth at bedtime.    . Multiple Vitamins-Minerals (ICAPS PO) Take 1 tablet by mouth at bedtime.   Marland Kitchen MYRBETRIQ 25 MG TB24 tablet Take 25 mg by mouth at bedtime.  12/20/2014: Received from: External Pharmacy  . NON FORMULARY 1-2 application by Pump Prime route 3 (three) times daily. COMPOUND CREAM: DIC/BAC/GAB/LID/PR/MEN   . psyllium (HYDROCIL/METAMUCIL) 95 % PACK Take 1 packet by mouth every other day.   . VOLTAREN 1 % GEL Apply 2 g topically daily as needed (Muscle Pain).    . cefpodoxime (VANTIN) 200 MG tablet Take 1 tablet (200 mg  total) by mouth 2 (two) times daily. (Patient not taking: Reported on 06/25/2016) 07/17/2016: Finished antibotic   . Diclofenac Sodium POWD Apply topically daily as needed.  04/04/2016: Received from: External Pharmacy  . nitroGLYCERIN (NITROSTAT) 0.4 MG SL tablet Place 0.4 mg under the tongue every 5 (five) minutes as needed for chest pain.   . potassium chloride SA (K-DUR,KLOR-CON) 20 MEQ tablet Take 2 tablets (40 mEq total) by mouth daily.    No facility-administered encounter medications on file as of 07/17/2016.      Assessment:   Mrs Gatliff was visited at her sister, Carolyn's home. She was sitting at the dining room table with her sister, Hoyle Sauer and Steward Drone when [facility] CM arrived. During the visit Carolyn's husband, Fritz Pickerel and Mrs Difabio other sister arrived.  Mrs Manocchio noted to be thriving states her sister Hoyle Sauer took her to the hair salon on 07/16/16. With review of [facility] Consent form, Mrs Mcinroy requested that her daughter in law Malachy Mood not be involved/an active participant in [facility] CM's care of her. Prior to leaving the home, Hoyle Sauer spoke with [facility] CM outside to discuss Mrs Iverson's disagreement with her son and his wife, cheryl. Medical providers were updated in EP[facility]. There has not been any further falls noted  since her stay with Hoyle Sauer.  Hoyle Sauer reports management of her narcotic medications.  Chronic medical conditions (CAD, Hypertension and Hyperlipidemia)  Now that Mrs Quirk has been residing with her sister Hoyle Sauer she is more compliant with taking her medications for CAS, Hypertension, hyperlipidemia and pain management. Her vital signs are within normal limits. With review of patients medications, there were questions related to her need to renew nitrostat and the reconciliation of her Guaif/cyclo/menth/compound cream.   Acute medical conditions Mrs Alejo informed [facility] CM she had issues with diarrhea and has not had a stool in a few days.  Her abdomen is distended and noted with decreased bowel  sounds. Reported nausea, abdominal pain with recent use of her prn dose of Zofran. Initiation of a bowel elimination program discussed to include stool softeners, warm fluids, increase of fluids and high fiber foods.  Mrs Simmerman has Colace and Miralax that can be used per order.  Discussed the importance of not straining during bowel elimination especially for a CAD pt. Discussed signs of constipation to including wet liquid that bypasses solid stool and appear to be diarrhea and straining causing irregular heart beats if the Valsalva Maneuver initiated.  Noted urinary frequency more than the usual urination with her Lasix use. She was encouraged to discuss this with her provider during her 07/18/16 MD office visit.  Discussed perineal care wiping front to back to assist with prevention of recurrent Urinary tract infections (UTIs). Mrs Vary informed [facility] CM she had been informed previous to wipe from back to front.  Explained to her that this may make her prone to having bacteria introduced to her urethra.    Level of care needs Hoyle Sauer informed [facility] CM that they had been in contact with [facility] snf admission staff who recommended they take Mrs Sakamoto to the emergency room for assist with placement.  Mrs Caison and her sisters inquired about the community and hospital medicare guidelines for facility placement.  THN CM reviewed the differences in community and hospital facility placement medicare guidelines.  There is not an identified skilled need for skilled nursing placement when assessed during the home visit.  Mrs Guest with assistance would meet criteria for assisted living placement and this is welcomed by her and her family as Hoyle Sauer confirms Mrs Dufrane's son informed her that Mrs Helle's stay with her would be temporary.   Mrs Lomba is schedule for a MD follow up appointment on 07/18/16   Plan:   [facility] SW will be consulted for level of care intervention updates [facility] CM will speak with Mrs Parcel's pharmacy   [facility] CM encouraged Mrs Budde and her sisters to call [facility] CM as needed Mrs Arciga will be contacted and in 1-2 weeks will be seen for a [facility] CM home visit   [facility] CM Care Plan Problem Two     Most Recent Value  Care Plan Problem Two  Knowledge deficit of home care for CAD, HDL, HTN patients as evidence by questions about medications, bowel elimination and diet  Role Documenting the Problem Two  Care Management [place] for Problem Two  Active  Interventions for Problem Two Long Term Goal   Review medications, review yellow an red zone symptoms.    THN Long Term Goal (31-90) days  over the next 31 days the patient and family will verbalize knowledge of home care and medication management for CAD, HTN, HDL and acute conditions (constipation,)  THN Long Term Goal Start Date  07/17/16  THN CM Short Term Goal #1 (0-30 days)  over the next 7 days Patient will follow ordered bowel elimination program medications and report a stool  THN CM Short Term Goal #1 Start Date  07/17/16  Interventions for Short Term Goal #2   Review constipation, diarrhea symptoms and standard diet, treatment plan for bowel elimination   THN CM Short Term Goal #2 (0-30 days)  over the next 7 days the patient will report to MD the abnormal symptoms noted related stools,   THN CM Short Term Goal #2 Start Date  07/17/16  Interventions for Short Term Goal #2  Review the symptoms to discuss during her next MD visit       Kinnick Maus L. Lavina Hamman, RN, BSN, [place] Care Management [phone]

## 2016-07-17 NOTE — Telephone Encounter (Signed)
This encounter was created in error - please disregard.

## 2016-07-18 ENCOUNTER — Other Ambulatory Visit (HOSPITAL_COMMUNITY)
Admission: RE | Admit: 2016-07-18 | Discharge: 2016-07-18 | Disposition: A | Discharge status: Home / Self Care | Payer: Medicare Other | Source: Other Acute Inpatient Hospital | Attending: Urology | Admitting: Urology

## 2016-07-18 ENCOUNTER — Ambulatory Visit (INDEPENDENT_AMBULATORY_CARE_PROVIDER_SITE_OTHER): Payer: Medicare Other | Admitting: Urology

## 2016-07-18 DIAGNOSIS — N3020 Other chronic cystitis without hematuria: Secondary | ICD-10-CM | POA: Insufficient documentation

## 2016-07-18 DIAGNOSIS — N3946 Mixed incontinence: Secondary | ICD-10-CM

## 2016-07-18 LAB — URINALYSIS, ROUTINE W REFLEX MICROSCOPIC
BILIRUBIN URINE: NEGATIVE
Glucose, UA: NEGATIVE mg/dL
Ketones, ur: NEGATIVE mg/dL
Nitrite: NEGATIVE
PH: 5 (ref 5.0–8.0)
Protein, ur: NEGATIVE mg/dL
SPECIFIC GRAVITY, URINE: 1.013 (ref 1.005–1.030)

## 2016-07-21 ENCOUNTER — Other Ambulatory Visit: Payer: Self-pay | Admitting: *Deleted

## 2016-07-21 LAB — URINE CULTURE: Culture: >=100000 — AB

## 2016-07-21 NOTE — Patient Outreach (Signed)
  [place] [facility]) Care Management  8/34/1960  MAZIAH SMOLA [birth year] [id number]   THN CM updated [place] on 07/18/16 about Mrs Soulliere and her sisters inquires about level of care community versus hospital medicare guidelines for facility placement.  Discussed all questions answered.  They had contact with there facility of their choice who encouraged taking Mrs Rey to emergency room.  THN CM assess and unable to identify a skilled nursing home skilled need but identified a skill need for assisted living facility, discussed with pt and process of a bed search.     Plan THN CM will continue to collaborate with [facility] SW related to level of care interventions for Mrs Roza Creamer L. Lavina Hamman, RN, BSN, [place] Care Management [phone]

## 2016-07-22 ENCOUNTER — Other Ambulatory Visit: Payer: Self-pay | Admitting: *Deleted

## 2016-07-22 NOTE — Patient Outreach (Signed)
[place] [facility]) Care Management  04/08/5850  LADIAMOND GALLINA 10[birth year] [id number]   Phone call from patient's son who requested an update on patient's progress regarding patient's placement.  This social worker explained the placement process and the documents needed to complete the process.  It was also confirmed that the corrected FL2 will be picked up today from patient's provider's office and will be faxed to local ALF's.   Plan:  This Education officer, museum will follow up with patient, patient's son and sister regarding placement progress.   [name] [facility] Care Management [phone]

## 2016-07-23 ENCOUNTER — Other Ambulatory Visit: Payer: Self-pay | Admitting: *Deleted

## 2016-07-23 NOTE — Patient Outreach (Signed)
[place] [facility]) Care Management  0/16/5537  [name] [name] [birth year] [id number]   This social worker made several calls to ALF's to check availability for long term care.   [place] and [place] identified as having availability FL2  faxed for their review.   Plan: This Education officer, museum will follow up with ALF's for bed offers.    [name] [facility] Care Management [phone]

## 2016-07-24 ENCOUNTER — Other Ambulatory Visit: Payer: Self-pay | Admitting: *Deleted

## 2016-07-24 NOTE — Patient Outreach (Signed)
[facility]) Care Management  7/54/4920  [name] [name] [birth year] [id number]   Follow up phone call to [place] to follow up on FL2 submitted.  Per admissions, FL2 was not received. FL2 re-faxed today.  Phone call to [facility]F to follow up on FL2 submitted. Per [place], Phoebe Perch received bt not yet reviewed. She will call this social worker back once received..  Phone call to [facility], spoke with the Admissions Director who stated that they do not have assisted living options.   Plan: This Education officer, museum will continue to follow up with ALF named above regarding possible bed offer.    [name] [facility] Care Management [phone]

## 2016-07-24 NOTE — Patient Outreach (Signed)
[facility]) Care Management  0/67/7034  [name] [name] [birth year] [id number]   Phone call from patient's sister for an update on the status of patient's placement in a ALF.  This Education officer, museum discussed all  facilities contacted and confirmed that there were two facilities that were reviewing patient's FL2 for possible placement, [facility] and [facility].  Patient's sister would also like for [facility] to be considered as well.   Plan: This social worker will contact [facility] to discuss possible placement and will follow back up with patient and patient's sister regarding status of placement process.    [name] [facility] Care Management [phone]

## 2016-07-25 ENCOUNTER — Other Ambulatory Visit: Payer: Self-pay | Admitting: *Deleted

## 2016-07-25 NOTE — Patient Outreach (Signed)
[facility]) Care Management  12/09/8331  [name] [name] [birth year] [id number]   Phone call from [facility] area Runner, broadcasting/film/video for [employer], [name]. Per Remi Deter may be a possibility, however patient would need to be placed on a waiting list. There are 2 people ahead of her. She will reach out to patient and her family for a tour of the facility.   Plan: FL2 faxed to her attention.   [name] [facility] Care Management [phone]

## 2016-07-28 ENCOUNTER — Other Ambulatory Visit: Payer: Self-pay | Admitting: *Deleted

## 2016-07-28 ENCOUNTER — Encounter: Payer: Self-pay | Admitting: *Deleted

## 2016-07-28 NOTE — Patient Outreach (Signed)
[facility] [facility]) Care Management  12/21/3844  [name] [name] [birth year] [id number]   Phone call from [place], spoke with [facility] who confirmed that the face to face visit went well. Patient will need a TB test.  Contact information for patient's Medicaid worker, [name] [phone]  provided to [facility] for medicaid application follow up.    Plan: This Education officer, museum to continue to follow.   [name] [facility] Care Management [phone]

## 2016-07-28 NOTE — Patient Outreach (Signed)
[facility]) Care Management  3/32/9518  [name] [name] [birth year] [id number]   Late Entry-phone call to [facility] to follow up on possible bed offer.  Spoke with Onalee Hua who would like to assess patient face to face. Face to face assessment set up for 07/28/16 at 1:30 pm. Patient and patient's sister informed of assessment date and time.     [name] [facility] Care Management [phone]

## 2016-07-30 ENCOUNTER — Other Ambulatory Visit: Payer: Self-pay | Admitting: *Deleted

## 2016-07-30 NOTE — Telephone Encounter (Signed)
This encounter was created in error - please disregard.

## 2016-07-30 NOTE — Patient Outreach (Signed)
[facility]) Care Management  12/20/7827  [name] [name] [birth year] [id number]   Phone call from patient's sister [name] [name] who confirmed that [place] from [facility] came to assess patient on 07/28/16  and is willing to accept her into their ALF. Patient will need a TB test. Per Hoyle Sauer, she will take patient today to Urgent Care to get the TB test. Hoyle Sauer stated that she received a call from [place] and has scheduled a tour for 08/01/16 at 1 pm. Per Hoyle Sauer, she will also set up a tour for [employer].   Follow up phone call to [facility], left message with Onalee Hua to discuss Medicaid process and what patient's responsibility is to ensure coverage. Patient currently has Medicaid Part B.     [name] [facility] Care Management [phone]

## 2016-07-31 ENCOUNTER — Encounter: Payer: Self-pay | Admitting: *Deleted

## 2016-07-31 ENCOUNTER — Other Ambulatory Visit: Payer: Self-pay | Admitting: *Deleted

## 2016-07-31 DIAGNOSIS — R296 Repeated falls: Secondary | ICD-10-CM

## 2016-07-31 NOTE — Patient Outreach (Addendum)
[facility]) Care Management   5/42/7062  [name] [name] [birth year] [id number]  [name] [name] is an 83 y.o. female  with medical history which includes coronary artery disease, bladder cancer, dizziness, seizures, hypertension, hyperlipidemia, macular degeneration, cataract CKD stage IV, and frequent falls who last presented to the ED in December 2017 with weakness. No admissions but one ED visit in the last 6 months.  [name] [name] had been living with her son and daughter in law in [place] until very recently when she moved in with her sister in [place] ([place]). [name] [name] was referred to [facility] on 06/24/16 for assistance with level of care concerns, falls, and chronic disease management.   [name] [name], sister, explained that since she has taken over medication administration and decreased her daily dose of prn Xanax and Hydrocodone, [name] [name] has experienced no further falls and has been more alert and coherent. Mrs. [name] [name] was careful to point out that [name] [name]'s son and daughter in law went to great effort to manage [name] [name]'s medications but [name] [name] often would not allow them to have access to her medications.   Subjective: Reports "This is not a good day.  I am upset", "this always happen around here" They talk to people about me and don't involve me"    Objective:   BP 134/72 (BP Location: Left Arm, Patient Position: Sitting, Cuff Size: Large)   Pulse 68   Resp 20   Ht 1.6 m (5' 3" )   SpO2 96%    Review of Systems  Constitutional: Positive for malaise/fatigue. Negative for chills, diaphoresis, fever and weight loss.  HENT: Positive for hearing loss. Negative for congestion, ear discharge, ear pain, nosebleeds, sinus pain, sore throat and tinnitus.   Eyes: Positive for blurred vision. Negative for double vision, photophobia, pain, discharge and redness.  Respiratory: Negative.  Negative for cough, hemoptysis, sputum production,  shortness of breath, wheezing and stridor.   Cardiovascular: Negative.  Negative for chest pain, palpitations, orthopnea, claudication, leg swelling and PND.  Gastrointestinal: Negative.  Negative for abdominal pain, blood in stool, constipation, diarrhea, heartburn, melena, nausea and vomiting.  Genitourinary: Negative.  Negative for dysuria, flank pain, frequency, hematuria and urgency.  Musculoskeletal: Negative.  Negative for back pain, falls, joint pain, myalgias and neck pain.  Skin: Negative.  Negative for itching and rash.  Neurological: Positive for dizziness. Negative for tingling, tremors, sensory change, speech change, focal weakness, seizures, loss of consciousness, weakness and headaches.  Endo/Heme/Allergies: Negative for environmental allergies and polydipsia. Bruises/bleeds easily.  Psychiatric/Behavioral: Positive for depression. Negative for hallucinations, memory loss, substance abuse and suicidal ideas. The patient is not nervous/anxious and does not have insomnia.     Physical Exam  Constitutional: She is oriented to person, place, and time. She appears well-developed and well-nourished.  HENT:  Head: Normocephalic and atraumatic.  Right Ear: External ear normal.  Left Ear: External ear normal.  Nose: Nose normal.  Mouth/Throat: Oropharynx is clear and moist.  Eyes: Conjunctivae and EOM are normal. Pupils are equal, round, and reactive to light.  Neck: Normal range of motion. Neck supple.  Cardiovascular: Normal rate, regular rhythm, normal heart sounds and intact distal pulses.   Respiratory: Effort normal and breath sounds normal.  GI: Soft. Bowel sounds are normal.  Musculoskeletal: Normal range of motion.  Neurological: She is alert and oriented to person, place, and time.  Skin: Skin is warm and dry.  Psychiatric: Judgment normal. Her affect  is angry. She is agitated. Thought content is paranoid. Cognition and memory are normal. She exhibits a depressed [name].   Reports "This is not a good day.  I am upset" "this always happen around here"  "They talk to people about me and don't involve me"    Encounter Medications:   Outpatient Encounter Prescriptions as of 07/31/2016  Medication Sig Note  . ALPRAZolam (XANAX) 0.5 MG tablet Take 0.125-0.5 mg by mouth 2 (two) times daily as needed for anxiety. For nerves 06/24/2016: Sister now managing this medication and reports that patient is taking less daily since she began management  . amLODipine (NORVASC) 5 MG tablet Take 5 mg by mouth daily.   Marland Kitchen aspirin EC 81 MG tablet Take 81 mg by mouth at bedtime.    . AZO-CRANBERRY PO Take 2 capsules by mouth every morning.   . BELSOMRA 20 MG TABS Take 20 mg by mouth at bedtime.  12/20/2014: Received from: External Pharmacy  . cefpodoxime (VANTIN) 200 MG tablet Take 1 tablet (200 mg total) by mouth 2 (two) times daily. (Patient not taking: Reported on 06/25/2016) 07/17/2016: Finished antibotic   . compounded topicals builder Apply 1 application topically 3 (three) times daily as needed. Guaif/cyclo/menth/ CREAM   . CRESTOR 20 MG tablet Take 1 tablet by mouth at bedtime.    . Diclofenac Sodium POWD Apply topically daily as needed.  04/04/2016: Received from: External Pharmacy  . docusate sodium (COLACE) 100 MG capsule Take 200 mg by mouth every morning.   . fentaNYL (DURAGESIC - DOSED MCG/HR) 25 MCG/HR patch Place 25 mcg onto the skin every 3 (three) days.  04/04/2016: Patch currently being worn is "old" per family and is due to be removed today 04/04/2016  . furosemide (LASIX) 20 MG tablet Take 20 mg by mouth every other day.  12/20/2014: Received from: External Pharmacy  . gabapentin (NEURONTIN) 300 MG capsule Take 300 mg by mouth 2 (two) times daily.  12/20/2015: Received from: External Pharmacy  . HYDROcodone-acetaminophen (NORCO) 10-325 MG per tablet Take 1 tablet by mouth every 4 (four) hours as needed for moderate pain or severe pain.    Marland Kitchen l-methylfolate-B6-B12 (METANX)  3-35-2 MG TABS tablet Take 1 tablet by mouth at bedtime.   . levETIRAcetam (KEPPRA) 250 MG tablet Take 1 tablet (250 mg total) by mouth 2 (two) times daily.   Marland Kitchen LINZESS 145 MCG CAPS capsule Take 145 mcg by mouth at bedtime.  12/20/2014: Received from: External Pharmacy  . Magnesium 250 MG TABS Take 250 mg by mouth at bedtime.    . Multiple Vitamins-Minerals (ICAPS PO) Take 1 tablet by mouth at bedtime.   Marland Kitchen MYRBETRIQ 25 MG TB24 tablet Take 25 mg by mouth at bedtime.  12/20/2014: Received from: External Pharmacy  . nitroGLYCERIN (NITROSTAT) 0.4 MG SL tablet Place 0.4 mg under the tongue every 5 (five) minutes as needed for chest pain.   . NON FORMULARY 1-2 application by Pump Prime route 3 (three) times daily. COMPOUND CREAM: DIC/BAC/GAB/LID/PR/MEN   . potassium chloride SA (K-DUR,KLOR-CON) 20 MEQ tablet Take 2 tablets (40 mEq total) by mouth daily.   . psyllium (HYDROCIL/METAMUCIL) 95 % PACK Take 1 packet by mouth every other day.   . VOLTAREN 1 % GEL Apply 2 g topically daily as needed (Muscle Pain).     No facility-administered encounter medications on file as of 07/31/2016.     Functional Status:   In your present state of health, do you have any difficulty performing the  following activities: 06/25/2016 06/24/2016  Hearing? N -  Vision? N -  Difficulty concentrating or making decisions? N -  Walking or climbing stairs? Y -  Dressing or bathing? Y -  Doing errands, shopping? - Y  Preparing Food and eating ? - Y  Using the Toilet? - Y  In the past six months, have you accidently leaked urine? - Y  Do you have problems with loss of bowel control? N -  Managing your Medications? - Y  Managing your Finances? - Y  Housekeeping or managing your Housekeeping? - Y  Some recent data might be hidden    Fall/Depression Screening:    PHQ 2/9 Scores 06/25/2016  PHQ - 2 Score 1    Assessment:   Mrs [name] [name] was visited at her sister, [name] [name]'s home again today. She was sitting at the chair over  looking the patio but not at the dining room table when [facility] CM arrived. When greeted she appeared with a frown and informed THN Cm she was not happy and "today is not a good day for me" and would not provide detail information until [facility] CM began to ask her questions. Her sister, [name] [name] was pacing and not as interactive with Mrs Hollis as she had been during [facility] CM's first home visit.  During the visit [name] [name]'s husband, [name] [name] and Mrs Dutan other sister, [name] [name], arrived but there was strained conversations.    Mrs [name] [name] confirms again there has not been any further falls noted since her stay with [name] [name].  [name] [name] continue to reports management of her narcotic medications.  Chronic medical conditions (CAD, Hypertension and Hyperlipidemia) Mrs [name] [name] continues to be compliant with taking her medications for CAD, Hypertension, hyperlipidemia and pain management. She denies medication changes. She reports her appetite was good. [name] [name] the brother in law stated her appetite was "more than good" Her vital signs are within normal limits. THN CM had contacted various pharmacies but finally contacted [facility] to discuss her Guaif/cyclo/menth/compound cream.  THN CM confirmed this medication is not on automatic delivery and that Mrs Sykora has contacted the pharmacy only yearly to obtain it and is welcome to call after both compound medication bottles are empty.    Acute medical conditions Mrs [name] [name] informed [facility] CM she has not had issues with diarrhea but some constipation is has not been straining to have a stool as she was previously.  Last Bm was last night. Denies nausea, abdominal pain. THN Cm previously discussed use of a bowel elimination program to include stool softeners, warm fluids, increase of fluids and high fiber foods.  Mrs [name] [name] has Colace and Miralax that can be used per order.     Level of care needs  Mrs Purtle and her sister had a home visit from [place] assisted living facility and  they visited [place] on July 30 2016.  [name] [name] offered a bed for "Wednesday or this Friday" but she will need a "special form from [name] [name]" (from medicaid to assist with the cost of the stay) Mrs [name] [name] states she will assist with getting this completed.  Mrs Waguespack informed THN CM she "love and want to go to [facility] but I feel rushed and know I need to go but I feel like I am losing my family".  THN CM and Mrs [name] [name] had a detailed conversation so she could process through her feelings about her fears and concerns about [facility], leaving her sister's home, her present and desired relationship  with her son, [name] [name] and daughter in law, [name] [name], her wanting closure/understanding of what happened to make her son contact her sister to pick her up from his home, her expectations of [facility] and questions about accommodations at [facility]. Mrs Vonbehren agreed to return to [facility] for another facility visit today to get a better idea of the accommodations offered and to ask further questions.   She prefers to have a "private room" but the private room available is at the end of a hallway, she has difficulty walking long distances and the staff offered to place her in a room closer to the nursing station but the room would be semi private and she would have a room mate.  Mrs [name] [name] states she does not prefer a room mate but will agree to one if she can get to meet the room mate and this arrangement be considered as temporary.  She also prefers not to go to dining room for breakfast so she can rest in the mornings. She reports she is not a morning person and getting up at 10 am or 12 noon is her preference.  She voiced interest in bringing items to the facility room like her tv, snacks, recliner and shower chair.  THN CM answered various questions about general facility processes and encouraged her to discuss her voiced requests with [name] [name] to be accommodated but encouraged her not to take her shower chair as the facility  should have one and she can use her personal one when she visit family.  The facility is less than 5 miles form her sister's home and at this time it is not an option per her sister that  Mrs [name] [name] remain at her home since initially the stay was to be temporarily.  Mrs [name] [name] voices understanding of this related the facts that she knows her stay was temporary, there is only one guest room and she has noted her sisters other family members who have visited recently can not stay because she has occupied the guest room but this makes her sad.  She reports having difficulty with change (now especially leaving her sister whom she feels very close to) She apologized to her sister for speaking to her in a manner that was unpleasant prior to [place] arrival.  Prior to [place] leaving each one of the members of the household were communicating, smiling and [name] [name] had received a return call from [place].  It had been agreed upon that Mrs Brines could return to [facility] at 1630 to complete another tour of the facility and to ask further questions.  The room mate had been picked up by her family for a week end visit and Mrs Ramser would not have the time to meet the room mate.  She voiced she was okay with that.  [name] [name] had initially planned to meet [name] [name], Mrs Capp son to get some of Mrs Guerrero's belongings but had asked her other sister, [name] [name] to assist.    Plan:  [facility] CM allowed Mrs [name] [name] to ventilate her feelings and process through her feelings and develop a plan of action.  THN CM provided POA/Advance Directives package, MOST form and DNR form. [facility] contacted pharmacy [facility]) to check on Compound medication refills and purchase option. THN CM will follow up with Mrs [name] [name] to see if she has decided and agreed to Assisted living facility placement to the facility of her choice and if not, to see if  there are any further needs.   If Mrs [name] [name] is admitted to an assisted living facility of her choice, [facility]  CM will close case as discussed with Mrs [name] [name] on 07/31/16  This note routed to all medical providers on care team list  [facility] CM Care Plan Problem One     Most Recent Value  Care Plan Problem One  (P) Knowledge Deficits and Care Coordination Needs related to Level of Care Concerns as evidenced by provider referall and concerns and family requests for assistance  Role Documenting the Problem One  (P) Care Management [place] for Problem One  (P) Active  THN Long Term Goal (31-90 days)  (P) Over the next 31 days, patient/family members will verbalize understanding of plan of care to address current level of care concerns and patient care needs  [facility] Long Term Goal Start Date  (P) 06/24/16  THN Long Term Goal Met Date  (P) 07/31/16  Interventions for Problem One Long Term Goal  (P) discussed care needs, plan of care, MD recommendations, and placement process with patient by phone,  collaborated with LCSW  THN CM Short Term Goal #1 (0-30 days)  (P) Over the next 7 days, patient family will assist with Medicaid process (change to long term care), Family will assist with SPecial form for medicaid per [facility] request /guidance  THN CM Short Term Goal #1 Start Date  (P) 07/31/16  Interventions for Short Term Goal #1  (P) notified patient that dtr in law is working on [employer] process and answered her questions about same  THN CM Short Term Goal #2 (0-30 days)  (P) Over the next 30 days, patient/family will choose from available beds at facilities for long term care  [facility] CM Short Term Goal #2 Start Date  (P) 07/17/16  THN CM Short Term Goal #2 Met Date  (P) 07/30/16  Interventions for Short Term Goal #2  (P) discussed with patient the "bed offer" and "bed selection" process when searching for long term care,  addressed patient questions       Aftin Lye L. Lavina Hamman, RN, BSN, [facility] Care Management [phone]

## 2016-08-01 ENCOUNTER — Other Ambulatory Visit: Payer: Self-pay | Admitting: *Deleted

## 2016-08-01 NOTE — Patient Outreach (Signed)
[facility] [facility]) Care Management  8/71/9597  [name] [name] 07/12/1851 [id number]   Phone call to patient and patient's sister to discuss plan for patient to transition to [facility]. Per patient's sister, patient made a second visit to the facility and has decided to accept the bed offer. Patient plans to move in on Monday, 08/04/16. Patient discussed having difficulty with this decision, however knows that it is something that she has to do. This Education officer, museum provided patient with supportive counseling, emphasized allowing time to adjust to her new environment.  Plan: This Education officer, museum will confirm patient's transition to [facility]  ALF on 08/04/16.  [name] [facility] Care Management [phone]

## 2016-08-05 ENCOUNTER — Other Ambulatory Visit: Payer: Self-pay | Admitting: *Deleted

## 2016-08-05 ENCOUNTER — Encounter: Payer: Self-pay | Admitting: *Deleted

## 2016-08-05 NOTE — Patient Outreach (Signed)
[facility]) Care Management  09/09/9933  MARYLAND STELL [birth year] [id number]   Phone call to patient's sister, Lavella Hammock who confirmed that patient has moved to [facility] on 08/04/16. Per patient's sister, patient is adjusting well.    Plan: Patient to be closed to social work at this time due to her transition to [place] facility.    [name] [facility] Care Management [phone]

## 2016-08-06 ENCOUNTER — Other Ambulatory Visit: Payer: Self-pay | Admitting: *Deleted

## 2016-08-06 NOTE — Patient Outreach (Signed)
[facility]) Care Management  05/14/6392  [name] [birth year] [id number]  Case Closure  THN CM collaborated with [place] who confirmed [name] [name] is now residing at [facility] facility as of 08/04/16 as confirmed by Lavella Hammock, sister on 08/05/16.  THN CM spoke with [name] Agan on the last home visit about case closure if she was admitted to [facility] facility and she voiced understanding.   Plans:  Case closure - enrolled in an external program ([place]) Sent message to [facility] case closure letter to medical providers and [name] Claudetta Sallie L. Lavina Hamman, RN, BSN, [place] Care Management [phone]

## 2016-08-20 DIAGNOSIS — G894 Chronic pain syndrome: Secondary | ICD-10-CM | POA: Diagnosis not present

## 2016-08-20 DIAGNOSIS — R52 Pain, unspecified: Secondary | ICD-10-CM | POA: Diagnosis not present

## 2016-08-20 DIAGNOSIS — M6281 Muscle weakness (generalized): Secondary | ICD-10-CM | POA: Diagnosis not present

## 2016-08-27 DIAGNOSIS — M6281 Muscle weakness (generalized): Secondary | ICD-10-CM | POA: Diagnosis not present

## 2016-08-27 DIAGNOSIS — M549 Dorsalgia, unspecified: Secondary | ICD-10-CM | POA: Diagnosis not present

## 2016-08-27 DIAGNOSIS — R262 Difficulty in walking, not elsewhere classified: Secondary | ICD-10-CM | POA: Diagnosis not present

## 2016-08-27 DIAGNOSIS — I10 Essential (primary) hypertension: Secondary | ICD-10-CM | POA: Diagnosis not present

## 2016-08-27 DIAGNOSIS — M1990 Unspecified osteoarthritis, unspecified site: Secondary | ICD-10-CM | POA: Diagnosis not present

## 2016-08-27 DIAGNOSIS — Z9181 History of falling: Secondary | ICD-10-CM | POA: Diagnosis not present

## 2016-08-27 DIAGNOSIS — R296 Repeated falls: Secondary | ICD-10-CM | POA: Diagnosis not present

## 2016-08-27 DIAGNOSIS — R278 Other lack of coordination: Secondary | ICD-10-CM | POA: Diagnosis not present

## 2016-08-27 DIAGNOSIS — M5416 Radiculopathy, lumbar region: Secondary | ICD-10-CM | POA: Diagnosis not present

## 2016-09-03 DIAGNOSIS — M6281 Muscle weakness (generalized): Secondary | ICD-10-CM | POA: Diagnosis not present

## 2016-09-03 DIAGNOSIS — M5416 Radiculopathy, lumbar region: Secondary | ICD-10-CM | POA: Diagnosis not present

## 2016-09-03 DIAGNOSIS — M1990 Unspecified osteoarthritis, unspecified site: Secondary | ICD-10-CM | POA: Diagnosis not present

## 2016-09-03 DIAGNOSIS — Z9181 History of falling: Secondary | ICD-10-CM | POA: Diagnosis not present

## 2016-09-03 DIAGNOSIS — I10 Essential (primary) hypertension: Secondary | ICD-10-CM | POA: Diagnosis not present

## 2016-09-03 DIAGNOSIS — R262 Difficulty in walking, not elsewhere classified: Secondary | ICD-10-CM | POA: Diagnosis not present

## 2016-09-03 DIAGNOSIS — R278 Other lack of coordination: Secondary | ICD-10-CM | POA: Diagnosis not present

## 2016-09-04 DIAGNOSIS — Z9181 History of falling: Secondary | ICD-10-CM | POA: Diagnosis not present

## 2016-09-04 DIAGNOSIS — M1990 Unspecified osteoarthritis, unspecified site: Secondary | ICD-10-CM | POA: Diagnosis not present

## 2016-09-04 DIAGNOSIS — R278 Other lack of coordination: Secondary | ICD-10-CM | POA: Diagnosis not present

## 2016-09-04 DIAGNOSIS — I10 Essential (primary) hypertension: Secondary | ICD-10-CM | POA: Diagnosis not present

## 2016-09-04 DIAGNOSIS — R262 Difficulty in walking, not elsewhere classified: Secondary | ICD-10-CM | POA: Diagnosis not present

## 2016-09-04 DIAGNOSIS — M5416 Radiculopathy, lumbar region: Secondary | ICD-10-CM | POA: Diagnosis not present

## 2016-09-04 DIAGNOSIS — M6281 Muscle weakness (generalized): Secondary | ICD-10-CM | POA: Diagnosis not present

## 2016-09-08 ENCOUNTER — Encounter (HOSPITAL_COMMUNITY): Payer: Self-pay | Admitting: Emergency Medicine

## 2016-09-08 DIAGNOSIS — Z951 Presence of aortocoronary bypass graft: Secondary | ICD-10-CM

## 2016-09-08 DIAGNOSIS — Z79899 Other long term (current) drug therapy: Secondary | ICD-10-CM | POA: Diagnosis not present

## 2016-09-08 DIAGNOSIS — N184 Chronic kidney disease, stage 4 (severe): Secondary | ICD-10-CM | POA: Diagnosis not present

## 2016-09-08 DIAGNOSIS — I2510 Atherosclerotic heart disease of native coronary artery without angina pectoris: Secondary | ICD-10-CM | POA: Diagnosis present

## 2016-09-08 DIAGNOSIS — Z880 Allergy status to penicillin: Secondary | ICD-10-CM

## 2016-09-08 DIAGNOSIS — I129 Hypertensive chronic kidney disease with stage 1 through stage 4 chronic kidney disease, or unspecified chronic kidney disease: Secondary | ICD-10-CM | POA: Diagnosis not present

## 2016-09-08 DIAGNOSIS — N12 Tubulo-interstitial nephritis, not specified as acute or chronic: Principal | ICD-10-CM | POA: Diagnosis present

## 2016-09-08 DIAGNOSIS — Z905 Acquired absence of kidney: Secondary | ICD-10-CM | POA: Diagnosis not present

## 2016-09-08 DIAGNOSIS — Z888 Allergy status to other drugs, medicaments and biological substances status: Secondary | ICD-10-CM

## 2016-09-08 DIAGNOSIS — B9620 Unspecified Escherichia coli [E. coli] as the cause of diseases classified elsewhere: Secondary | ICD-10-CM | POA: Diagnosis present

## 2016-09-08 DIAGNOSIS — D631 Anemia in chronic kidney disease: Secondary | ICD-10-CM | POA: Diagnosis not present

## 2016-09-08 DIAGNOSIS — K5730 Diverticulosis of large intestine without perforation or abscess without bleeding: Secondary | ICD-10-CM | POA: Diagnosis not present

## 2016-09-08 DIAGNOSIS — E782 Mixed hyperlipidemia: Secondary | ICD-10-CM | POA: Diagnosis present

## 2016-09-08 DIAGNOSIS — Z9181 History of falling: Secondary | ICD-10-CM | POA: Diagnosis not present

## 2016-09-08 DIAGNOSIS — F419 Anxiety disorder, unspecified: Secondary | ICD-10-CM | POA: Diagnosis present

## 2016-09-08 DIAGNOSIS — H3530 Unspecified macular degeneration: Secondary | ICD-10-CM | POA: Diagnosis not present

## 2016-09-08 DIAGNOSIS — R319 Hematuria, unspecified: Secondary | ICD-10-CM | POA: Diagnosis not present

## 2016-09-08 DIAGNOSIS — Z7982 Long term (current) use of aspirin: Secondary | ICD-10-CM | POA: Diagnosis not present

## 2016-09-08 DIAGNOSIS — G40909 Epilepsy, unspecified, not intractable, without status epilepticus: Secondary | ICD-10-CM | POA: Diagnosis present

## 2016-09-08 DIAGNOSIS — R296 Repeated falls: Secondary | ICD-10-CM | POA: Diagnosis not present

## 2016-09-08 DIAGNOSIS — R1084 Generalized abdominal pain: Secondary | ICD-10-CM | POA: Diagnosis not present

## 2016-09-08 DIAGNOSIS — Z79891 Long term (current) use of opiate analgesic: Secondary | ICD-10-CM | POA: Diagnosis not present

## 2016-09-08 DIAGNOSIS — M549 Dorsalgia, unspecified: Secondary | ICD-10-CM | POA: Diagnosis not present

## 2016-09-08 LAB — CBC WITH DIFFERENTIAL/PLATELET
Basophils Absolute: 0 10*3/uL (ref 0.0–0.1)
Basophils Relative: 0 %
EOS PCT: 4 %
Eosinophils Absolute: 0.3 10*3/uL (ref 0.0–0.7)
HCT: 37.6 % (ref 36.0–46.0)
Hemoglobin: 12.2 g/dL (ref 12.0–15.0)
Lymphocytes Relative: 50 %
Lymphs Abs: 3.4 10*3/uL (ref 0.7–4.0)
MCH: 29.6 pg (ref 26.0–34.0)
MCHC: 32.4 g/dL (ref 30.0–36.0)
MCV: 91.3 fL (ref 78.0–100.0)
MONO ABS: 0.8 10*3/uL (ref 0.1–1.0)
MONOS PCT: 12 %
Neutro Abs: 2.3 10*3/uL (ref 1.7–7.7)
Neutrophils Relative %: 34 %
PLATELETS: 223 10*3/uL (ref 150–400)
RBC: 4.12 MIL/uL (ref 3.87–5.11)
RDW: 14.7 % (ref 11.5–15.5)
WBC: 6.8 10*3/uL (ref 4.0–10.5)

## 2016-09-08 LAB — COMPREHENSIVE METABOLIC PANEL
ALK PHOS: 64 U/L (ref 38–126)
ALT: 15 U/L (ref 14–54)
AST: 61 U/L — AB (ref 15–41)
Albumin: 3.5 g/dL (ref 3.5–5.0)
Anion gap: 9 (ref 5–15)
BUN: 32 mg/dL — AB (ref 6–20)
CHLORIDE: 101 mmol/L (ref 101–111)
CO2: 27 mmol/L (ref 22–32)
CREATININE: 2.05 mg/dL — AB (ref 0.44–1.00)
Calcium: 9.3 mg/dL (ref 8.9–10.3)
GFR calc Af Amer: 24 mL/min — ABNORMAL LOW (ref >60)
GFR, EST NON AFRICAN AMERICAN: 21 mL/min — AB (ref >60)
Glucose, Bld: 108 mg/dL — ABNORMAL HIGH (ref 65–99)
Potassium: 4.9 mmol/L (ref 3.5–5.1)
Sodium: 137 mmol/L (ref 135–145)
Total Bilirubin: 1.7 mg/dL — ABNORMAL HIGH (ref 0.3–1.2)
Total Protein: 7.1 g/dL (ref 6.5–8.1)

## 2016-09-08 LAB — URINALYSIS, ROUTINE W REFLEX MICROSCOPIC
Bilirubin Urine: NEGATIVE
Glucose, UA: NEGATIVE mg/dL
KETONES UR: NEGATIVE mg/dL
Nitrite: POSITIVE — AB
PROTEIN: 30 mg/dL — AB
Specific Gravity, Urine: 1.011 (ref 1.005–1.030)
pH: 7 (ref 5.0–8.0)

## 2016-09-08 NOTE — ED Triage Notes (Signed)
Pt to ED via [facility] from [facility] with c/o right flank pain x's 3 days and hematuria that started tonight.  Pt had left kidney removed in 2000 due to cancer and st's symptoms are the same

## 2016-09-09 ENCOUNTER — Emergency Department (HOSPITAL_COMMUNITY): Payer: Medicare Other

## 2016-09-09 ENCOUNTER — Encounter (HOSPITAL_COMMUNITY): Payer: Self-pay | Admitting: General Practice

## 2016-09-09 ENCOUNTER — Inpatient Hospital Stay (HOSPITAL_COMMUNITY)
Admission: EM | Admit: 2016-09-09 | Discharge: 2016-09-11 | DRG: 690 | Disposition: A | Discharge status: Home / Self Care | Payer: Medicare Other | Attending: Family Medicine | Admitting: Family Medicine

## 2016-09-09 DIAGNOSIS — N184 Chronic kidney disease, stage 4 (severe): Secondary | ICD-10-CM | POA: Diagnosis present

## 2016-09-09 DIAGNOSIS — N390 Urinary tract infection, site not specified: Secondary | ICD-10-CM | POA: Diagnosis present

## 2016-09-09 DIAGNOSIS — Z888 Allergy status to other drugs, medicaments and biological substances status: Secondary | ICD-10-CM | POA: Diagnosis not present

## 2016-09-09 DIAGNOSIS — G40909 Epilepsy, unspecified, not intractable, without status epilepticus: Secondary | ICD-10-CM | POA: Diagnosis present

## 2016-09-09 DIAGNOSIS — K5730 Diverticulosis of large intestine without perforation or abscess without bleeding: Secondary | ICD-10-CM | POA: Diagnosis not present

## 2016-09-09 DIAGNOSIS — G8929 Other chronic pain: Secondary | ICD-10-CM | POA: Diagnosis not present

## 2016-09-09 DIAGNOSIS — N12 Tubulo-interstitial nephritis, not specified as acute or chronic: Secondary | ICD-10-CM | POA: Diagnosis not present

## 2016-09-09 DIAGNOSIS — Z79899 Other long term (current) drug therapy: Secondary | ICD-10-CM | POA: Diagnosis not present

## 2016-09-09 DIAGNOSIS — F419 Anxiety disorder, unspecified: Secondary | ICD-10-CM | POA: Diagnosis present

## 2016-09-09 DIAGNOSIS — I10 Essential (primary) hypertension: Secondary | ICD-10-CM | POA: Diagnosis present

## 2016-09-09 DIAGNOSIS — Z951 Presence of aortocoronary bypass graft: Secondary | ICD-10-CM | POA: Diagnosis not present

## 2016-09-09 DIAGNOSIS — R109 Unspecified abdominal pain: Secondary | ICD-10-CM

## 2016-09-09 DIAGNOSIS — I2510 Atherosclerotic heart disease of native coronary artery without angina pectoris: Secondary | ICD-10-CM | POA: Diagnosis present

## 2016-09-09 DIAGNOSIS — Z905 Acquired absence of kidney: Secondary | ICD-10-CM | POA: Diagnosis not present

## 2016-09-09 DIAGNOSIS — R531 Weakness: Secondary | ICD-10-CM | POA: Diagnosis not present

## 2016-09-09 DIAGNOSIS — B9620 Unspecified Escherichia coli [E. coli] as the cause of diseases classified elsewhere: Secondary | ICD-10-CM | POA: Diagnosis present

## 2016-09-09 DIAGNOSIS — Z880 Allergy status to penicillin: Secondary | ICD-10-CM | POA: Diagnosis not present

## 2016-09-09 DIAGNOSIS — E782 Mixed hyperlipidemia: Secondary | ICD-10-CM | POA: Diagnosis present

## 2016-09-09 DIAGNOSIS — I129 Hypertensive chronic kidney disease with stage 1 through stage 4 chronic kidney disease, or unspecified chronic kidney disease: Secondary | ICD-10-CM | POA: Diagnosis present

## 2016-09-09 HISTORY — DX: Chronic kidney disease, stage 4 (severe): N18.4

## 2016-09-09 LAB — MRSA PCR SCREENING: MRSA by PCR: NEGATIVE

## 2016-09-09 LAB — POC OCCULT BLOOD, ED: Fecal Occult Bld: POSITIVE — AB

## 2016-09-09 MED ORDER — ACETAMINOPHEN 325 MG PO TABS: 650.0000 mg | ORAL_TABLET | Freq: Four times a day (QID) | ORAL | Status: DC | PRN

## 2016-09-09 MED ORDER — DEXTROSE 5 % IV SOLN
1.0000 g | INTRAVENOUS | Status: DC
  Administered 2016-09-10 – 2016-09-11 (×2): 1 g via INTRAVENOUS
  Filled 2016-09-09 (×2): qty 10

## 2016-09-09 MED ORDER — FENTANYL CITRATE (PF) 100 MCG/2ML IJ SOLN
25.0000 ug | Freq: Once | INTRAMUSCULAR | Status: AC
  Administered 2016-09-09: 25 ug via INTRAVENOUS
  Filled 2016-09-09: qty 2

## 2016-09-09 MED ORDER — MAGNESIUM OXIDE 400 (241.3 MG) MG PO TABS
200.0000 mg | ORAL_TABLET | Freq: Every day | ORAL | Status: DC
  Administered 2016-09-09 – 2016-09-10 (×2): 200 mg via ORAL
  Filled 2016-09-09 (×2): qty 1

## 2016-09-09 MED ORDER — DEXTROSE 5 % IV SOLN
1.0000 g | Freq: Once | INTRAVENOUS | Status: AC
  Administered 2016-09-09: 1 g via INTRAVENOUS
  Filled 2016-09-09: qty 10

## 2016-09-09 MED ORDER — ALPRAZOLAM 0.25 MG PO TABS
0.1250 mg | ORAL_TABLET | Freq: Two times a day (BID) | ORAL | Status: DC | PRN
  Administered 2016-09-10: 0.5 mg via ORAL
  Filled 2016-09-09: qty 2

## 2016-09-09 MED ORDER — ENOXAPARIN SODIUM 30 MG/0.3ML ~~LOC~~ SOLN
30.0000 mg | SUBCUTANEOUS | Status: DC
  Administered 2016-09-09 – 2016-09-11 (×3): 30 mg via SUBCUTANEOUS
  Filled 2016-09-09 (×4): qty 0.3

## 2016-09-09 MED ORDER — PSYLLIUM 95 % PO PACK
1.0000 | PACK | ORAL | Status: DC | PRN
  Filled 2016-09-09: qty 1

## 2016-09-09 MED ORDER — L-METHYLFOLATE-B6-B12 3-35-2 MG PO TABS
1.0000 | ORAL_TABLET | Freq: Every day | ORAL | Status: DC
  Administered 2016-09-09 – 2016-09-10 (×2): 1 via ORAL
  Filled 2016-09-09 (×3): qty 1

## 2016-09-09 MED ORDER — ASPIRIN EC 81 MG PO TBEC
81.0000 mg | DELAYED_RELEASE_TABLET | Freq: Every day | ORAL | Status: DC
  Administered 2016-09-09 – 2016-09-10 (×2): 81 mg via ORAL
  Filled 2016-09-09 (×2): qty 1

## 2016-09-09 MED ORDER — LEVETIRACETAM 250 MG PO TABS
250.0000 mg | ORAL_TABLET | Freq: Two times a day (BID) | ORAL | Status: DC
  Administered 2016-09-09 – 2016-09-11 (×5): 250 mg via ORAL
  Filled 2016-09-09 (×6): qty 1

## 2016-09-09 MED ORDER — DOCUSATE SODIUM 100 MG PO CAPS
200.0000 mg | ORAL_CAPSULE | Freq: Every morning | ORAL | Status: DC
  Administered 2016-09-09 – 2016-09-11 (×3): 200 mg via ORAL
  Filled 2016-09-09 (×3): qty 2

## 2016-09-09 MED ORDER — GABAPENTIN 300 MG PO CAPS
300.0000 mg | ORAL_CAPSULE | Freq: Two times a day (BID) | ORAL | Status: DC
  Administered 2016-09-09 – 2016-09-11 (×5): 300 mg via ORAL
  Filled 2016-09-09 (×5): qty 1

## 2016-09-09 MED ORDER — FUROSEMIDE 20 MG PO TABS
20.0000 mg | ORAL_TABLET | ORAL | Status: DC
  Administered 2016-09-10: 20 mg via ORAL
  Filled 2016-09-09: qty 1

## 2016-09-09 MED ORDER — ZOLPIDEM TARTRATE 5 MG PO TABS: 5.0000 mg | ORAL_TABLET | Freq: Every evening | ORAL | Status: DC | PRN

## 2016-09-09 MED ORDER — SODIUM CHLORIDE 0.9 % IV SOLN
INTRAVENOUS | Status: AC
  Administered 2016-09-09 (×2): via INTRAVENOUS

## 2016-09-09 MED ORDER — SUVOREXANT 20 MG PO TABS: 20.0000 mg | ORAL_TABLET | Freq: Every day | ORAL | Status: DC

## 2016-09-09 MED ORDER — IOPAMIDOL (ISOVUE-300) INJECTION 61%
INTRAVENOUS | Status: AC
Start: 2016-09-09 — End: 2016-09-09
  Filled 2016-09-09: qty 30

## 2016-09-09 MED ORDER — AMLODIPINE BESYLATE 5 MG PO TABS
5.0000 mg | ORAL_TABLET | Freq: Every day | ORAL | Status: DC
  Administered 2016-09-09 – 2016-09-11 (×3): 5 mg via ORAL
  Filled 2016-09-09 (×3): qty 1

## 2016-09-09 MED ORDER — ACETAMINOPHEN 650 MG RE SUPP: 650.0000 mg | Freq: Four times a day (QID) | RECTAL | Status: DC | PRN

## 2016-09-09 MED ORDER — MIRABEGRON ER 25 MG PO TB24
25.0000 mg | ORAL_TABLET | Freq: Every day | ORAL | Status: DC
  Administered 2016-09-09 – 2016-09-10 (×2): 25 mg via ORAL
  Filled 2016-09-09 (×3): qty 1

## 2016-09-09 MED ORDER — LINACLOTIDE 145 MCG PO CAPS
145.0000 ug | ORAL_CAPSULE | Freq: Every day | ORAL | Status: DC
  Administered 2016-09-09 – 2016-09-10 (×2): 145 ug via ORAL
  Filled 2016-09-09 (×3): qty 1

## 2016-09-09 MED ORDER — HYDROCODONE-ACETAMINOPHEN 10-325 MG PO TABS
1.0000 | ORAL_TABLET | ORAL | Status: DC | PRN
  Administered 2016-09-09 – 2016-09-11 (×9): 1 via ORAL
  Filled 2016-09-09 (×10): qty 1

## 2016-09-09 MED ORDER — ROSUVASTATIN CALCIUM 20 MG PO TABS
20.0000 mg | ORAL_TABLET | Freq: Every day | ORAL | Status: DC
  Administered 2016-09-09 – 2016-09-10 (×2): 20 mg via ORAL
  Filled 2016-09-09 (×2): qty 1

## 2016-09-09 MED ORDER — MAGNESIUM 250 MG PO TABS: 250.0000 mg | ORAL_TABLET | Freq: Every day | ORAL | Status: DC

## 2016-09-09 NOTE — Progress Notes (Signed)
Ms. Nikkel is 83 year old female with a PMH seizure do,  recurrent UTIs, s/p right nephrectomy, and CKD stage IV; who presents with 2 days of right flank pain. Vital signs stable. Labs reveal BUN 32, creatinine 2.05. UA positive for signs of infection. CT scan of the abdomen reveals bladder wall thickening with left hydronephrosis. Started on IV antibiotics of Rocephin. Admit to a MedSurg bed inpatient.

## 2016-09-09 NOTE — ED Provider Notes (Signed)
[facility] DEPT Provider Note   CSN: [id number] Arrival date & time: 09/08/16  2028   By signing my name below, I, Eunice Blase, attest that this documentation has been prepared under the direction and in the presence of Ripley Fraise, MD. Electronically signed, Eunice Blase, ED Scribe. 09/09/16. 1:11 AM.   History   Chief Complaint Chief Complaint  Patient presents with  . Flank Pain  . Hematuria   The history is provided by the patient and medical records. No language interpreter was used.  Flank Pain  This is a chronic problem. The current episode started more than 2 days ago. The problem occurs constantly. The problem has been gradually worsening. Associated symptoms include abdominal pain. Pertinent negatives include no chest pain, no headaches and no shortness of breath.    [name] [name] is a 83 y.o. female with h/o R sided hernia and R kidney removal presenting to the Emergency Department with chief complaint of acute on chronic gradually worsening abdominal pains x 3-4 days. She describes 10/10 sharp aching pain worse with certain movements. Associated constipation, dysuria, new rectal bleeding last night and lower abdominal pains noted. Pt states her R kidney was removed around the year 2000 d/t finding of cancerous cells on the organ; triage notes indicates pt's current pain is similar to pain leading up to this. No fever, chest pain or diarrhea noted.  Past Medical History:  Diagnosis Date  . Back pain   . Bladder cancer ([place])   . CAD (coronary artery disease)   . Dizziness   . 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 Essential hypertension, benign   . Mixed hyperlipidemia   . Obesity   . Postsurgical aortocoronary bypass status   . Seizure [facility])     Patient Active Problem List   Diagnosis Date Noted  . Falls frequently 06/24/2016  . Tension headache 11/10/2013  . Chest pain 11/03/2011  . ARF (acute renal failure) ([place]) 04/30/2011  . UTI (urinary tract  infection) 04/30/2011  . CKD (chronic kidney disease), stage IV ([place]) 04/30/2011  . Anemia 04/30/2011  . S/p nephrectomy 04/30/2011  . Macular degeneration 04/30/2011  . Cataract 04/30/2011  . Bladder cancer ([place]) 04/30/2011  . Weakness generalized 04/29/2011  . Seizure disorder ([place]) 04/29/2011  . Benign hypertension 04/29/2011  . CAD (coronary artery disease) 04/29/2011  . Hyperlipidemia 04/29/2011  . Anxiety 04/29/2011  . Dizziness - light-headed 04/29/2011  . Chronic abdominal pain 04/29/2011  . MIXED HYPERLIPIDEMIA 05/24/2008  . ESSENTIAL HYPERTENSION, BENIGN 05/24/2008  . Postsurgical aortocoronary bypass status 05/24/2008    Past Surgical History:  Procedure Laterality Date  . CATARACT EXTRACTION    . CORONARY ARTERY BYPASS GRAFT    . IRIDECTOMY    . NEPHRECTOMY  2000    OB History    Gravida Para Term Preterm AB Living   3 3 3     3    SAB TAB Ectopic Multiple Live Births                   Home Medications    Prior to Admission medications   Medication Sig Start Date End Date Taking? Authorizing Provider  ALPRAZolam Duanne Moron) 0.5 MG tablet Take 0.125-0.5 mg by mouth 2 (two) times daily as needed for anxiety. For nerves    [provider]  amLODipine (NORVASC) 5 MG tablet Take 5 mg by mouth daily.    [provider]  aspirin EC 81 MG tablet Take 81 mg by mouth at  bedtime.     [provider]  AZO-CRANBERRY PO Take 2 capsules by mouth every morning.    [provider]  BELSOMRA 20 MG TABS Take 20 mg by mouth at bedtime.  12/15/14   [provider]  cefpodoxime (VANTIN) 200 MG tablet Take 1 tablet (200 mg total) by mouth 2 (two) times daily. Patient not taking: Reported on 06/25/2016 04/04/16   Mesner, Corene Cornea, MD  compounded topicals builder Apply 1 application topically 3 (three) times daily as needed. Guaif/cyclo/menth/ CREAM    [provider]  CRESTOR 20 MG tablet Take 1 tablet by mouth at bedtime.  01/06/11    [provider]  Diclofenac Sodium POWD Apply topically daily as needed.  04/04/16   [provider]  docusate sodium (COLACE) 100 MG capsule Take 200 mg by mouth every morning.    [provider]  fentaNYL (DURAGESIC - DOSED MCG/HR) 25 MCG/HR patch Place 25 mcg onto the skin every 3 (three) days.  11/27/14   [provider]  furosemide (LASIX) 20 MG tablet Take 20 mg by mouth every other day.  12/12/14   [provider]  gabapentin (NEURONTIN) 300 MG capsule Take 300 mg by mouth 2 (two) times daily.  11/20/15   [provider]  HYDROcodone-acetaminophen (NORCO) 10-325 MG per tablet Take 1 tablet by mouth every 4 (four) hours as needed for moderate pain or severe pain.     [provider]  l-methylfolate-B6-B12 (METANX) 3-35-2 MG TABS tablet Take 1 tablet by mouth at bedtime.    [provider]  levETIRAcetam (KEPPRA) 250 MG tablet Take 1 tablet (250 mg total) by mouth 2 (two) times daily. 12/20/15   Garvin Fila, MD  LINZESS 145 MCG CAPS capsule Take 145 mcg by mouth at bedtime.  12/18/14   [provider]  Magnesium 250 MG TABS Take 250 mg by mouth at bedtime.     [provider]  Multiple Vitamins-Minerals (ICAPS PO) Take 1 tablet by mouth at bedtime.    [provider]  MYRBETRIQ 25 MG TB24 tablet Take 25 mg by mouth at bedtime.  11/28/14   [provider]  nitroGLYCERIN (NITROSTAT) 0.4 MG SL tablet Place 0.4 mg under the tongue every 5 (five) minutes as needed for chest pain.    [provider]  NON FORMULARY 1-2 application by Pump Prime route 3 (three) times daily. COMPOUND CREAM: DIC/BAC/GAB/LID/PR/MEN    [provider]  potassium chloride SA (K-DUR,KLOR-CON) 20 MEQ tablet Take 2 tablets (40 mEq total) by mouth daily. 04/04/16 04/11/16  Mesner, Corene Cornea, MD  psyllium (HYDROCIL/METAMUCIL) 95 % PACK Take 1 packet by mouth every other day.    [provider]  VOLTAREN 1  % GEL Apply 2 g topically daily as needed (Muscle Pain).  08/20/12   [provider]    Family History Family History  Problem Relation Age of Onset  . Colon cancer Mother   . Heart attack Father   . Diabetes Father   . Stroke Unknown   . Coronary artery disease Unknown   . Cancer Unknown   . Diabetes Unknown     Social History Social History  Substance Use Topics  . Smoking status: Never Smoker  . Smokeless tobacco: Never Used  . Alcohol use No     Allergies   Adhesive [tape]; Keflet [cephalexin]; Latex; Penicillins; and Sulfa antibiotics   Review of Systems Review of Systems  Constitutional: Negative for fever.  Respiratory: Negative  for shortness of breath.   Cardiovascular: Negative for chest pain.  Gastrointestinal: Positive for abdominal pain, anal bleeding and constipation. Negative for diarrhea, nausea and vomiting.  Genitourinary: Positive for dysuria and flank pain.  Neurological: Negative for headaches.  All other systems reviewed and are negative.    Physical Exam Updated Vital Signs BP 140/74 (BP Location: Right Arm)   Pulse 64   Temp 98 F (36.7 C) (Oral)   Resp 18   Ht 5\' 2"  (1.575 m)   Wt 170 lb (77.1 kg)   SpO2 99%   BMI 31.09 kg/m   Physical Exam CONSTITUTIONAL: Elderly, frail HEAD: Normocephalic/atraumatic EYES: EOMI ENMT: Mucous membranes moist NECK: supple no meningeal signs SPINE/BACK:entire spine nontender CV: S1/S2 noted, Murmur noted LUNGS: Lungs are clear to auscultation bilaterally, no apparent distress ABDOMEN: soft, diffuse moderate tenderess, no rebound or guarding, bowel sounds noted throughout abdomen, large abdominal hernia noted.  Healed scar to lower abdominal wall RECTAL: no blood or melena noted, no masses noted, no stool impaction, nurse Candace present for exam GU:no cva tenderness NEURO: Pt is awake/alert/appropriate, moves all extremitiesx4.   EXTREMITIES: pulses normal/equal, full ROM SKIN: warm,  color normal PSYCH: no abnormalities of mood noted, alert and oriented to situation   ED Treatments / Results  DIAGNOSTIC STUDIES: Oxygen Saturation is 99% on RA, NL by my interpretation.    COORDINATION OF CARE: 1:07 AM-Discussed next steps with pt. Pt verbalized understanding and is agreeable with the plan. Pt prepared for female exam.  1:10 AM Will order medications and imaging.   Labs (all labs ordered are listed, but only abnormal results are displayed) Labs Reviewed  COMPREHENSIVE METABOLIC PANEL - Abnormal; Notable for the following:       Result Value   Glucose, Bld 108 (*)    BUN 32 (*)    Creatinine, Ser 2.05 (*)    AST 61 (*)    Total Bilirubin 1.7 (*)    GFR calc non Af Amer 21 (*)    GFR calc Af Amer 24 (*)    All other components within normal limits  URINALYSIS, ROUTINE W REFLEX MICROSCOPIC - Abnormal; Notable for the following:    APPearance TURBID (*)    Hgb urine dipstick SMALL (*)    Protein, ur 30 (*)    Nitrite POSITIVE (*)    Leukocytes, UA LARGE (*)    Bacteria, UA FEW (*)    Squamous Epithelial / LPF 0-5 (*)    All other components within normal limits  POC OCCULT BLOOD, ED - Abnormal; Notable for the following:    Fecal Occult Bld POSITIVE (*)    All other components within normal limits  URINE CULTURE  CBC WITH DIFFERENTIAL/PLATELET    EKG  EKG Interpretation None       Radiology Ct Abdomen Pelvis Wo Contrast  Result Date: 09/09/2016 CLINICAL DATA:  Abdominal pain. EXAM: CT ABDOMEN AND PELVIS WITHOUT CONTRAST TECHNIQUE: Multidetector CT imaging of the abdomen and pelvis was performed following the standard protocol without IV contrast. COMPARISON:  CT 02/06/2014 FINDINGS: Lower chest: Left basilar atelectasis with trace pleural effusion. Atherosclerosis of the thoracic aorta. Coronary artery calcifications. Tiny subpleural calcified nodule in the right middle lobe. Hepatobiliary: No focal hepatic lesion allowing for lack contrast. Mild  gallbladder distention without evidence of pericholecystic inflammation. No calcified gallstone. No biliary dilatation. Pancreas: Parenchymal atrophy. No ductal dilatation or inflammation. Spleen: Normal in size without focal abnormality. Adrenals/Urinary Tract: Post right nephrectomy. No adrenal nodule. Mild  left hydroureteronephrosis, ureter is dilated to the bladder insertion. Lobular urinary bladder with probable bladder diverticula, unchanged in morphologic appearance from prior exam. There is bladder wall thickening and perivesicular soft tissue stranding. Stomach/Bowel: Diverticulosis of the descending and sigmoid colon without diverticulitis. No small bowel inflammation, obstruction or wall thickening. Appendix is not confidently visualized. The stomach is physiologically distended. Vascular/Lymphatic: Dense aortic atherosclerosis without aneurysm. No adenopathy. Reproductive: Status post hysterectomy. No adnexal masses. Both ovaries are tentatively identified in unchanged in appearance. Other: No free air, free fluid, or intra-abdominal fluid collection. Small fat containing umbilical hernia. Laxity of the anterior abdominal wall. Musculoskeletal: Scoliosis and multilevel degenerative change in the spine. Remote right pubic rami fractures. Vertebral augmentation at L1. There are no acute or suspicious osseous abnormalities. IMPRESSION: 1. Bladder wall thickening with perivesicular soft tissue edema. Findings consistent with cystitis. Lobulated bladder contour is unchanged in morphology from prior exam. 2. Mild left hydroureteronephrosis, likely secondary to ascending infection. No urolithiasis or mechanical cause of urinary obstruction. 3. Distal colonic diverticulosis without diverticulitis. Aortic atherosclerosis. Electronically Signed   By: Jeb Levering M.D.   On: 09/09/2016 04:43    Procedures Procedures   Medications Ordered in ED Medications  iopamidol (ISOVUE-300) 61 % injection (not  administered)  cefTRIAXone (ROCEPHIN) 1 g in dextrose 5 % 50 mL IVPB (1 g Intravenous New Bag/Given 09/09/16 0538)  fentaNYL (SUBLIMAZE) injection 25 mcg (25 mcg Intravenous Given 09/09/16 0209)  fentaNYL (SUBLIMAZE) injection 25 mcg (25 mcg Intravenous Given 09/09/16 0333)     Initial Impression / Assessment and Plan / ED Course  I have reviewed the triage vital signs and the nursing notes.  Pertinent labs   results that were available during my care of the patient were reviewed by me and considered in my medical decision making (see chart for details).     6:03 AM Pt in the ED for increasing abdominal pain She gets recurrent UTIs Per CT imaging, she appears to have PYELO She is s/p right nephrectomy She is still reporting abd pain No other acute findings on CT imaging  Will admit due to age/history/findings Will need IV antibiotics D/w pharmacist and they recommend rocephin after reviewing allergy list (keflex reaction was nausea/vomiting)   D/w dr Fuller Plan for admission Patient/family updated on plan   No signs of acute GI bleed on my exam (there was reported rectal bleed earlier per family)   Final Clinical Impressions(s) / ED Diagnoses   Final diagnoses:  Pyelonephritis    New Prescriptions New Prescriptions   No medications on file  I personally performed the services described in this documentation, which was scribed in my presence. The recorded information has been reviewed and is accurate.        Ripley Fraise, MD 09/09/16 [phone]

## 2016-09-09 NOTE — ED Notes (Signed)
Pt ambulated to restroom with 2 person assist

## 2016-09-09 NOTE — Progress Notes (Signed)
Report received from ED. Room ready.

## 2016-09-09 NOTE — Progress Notes (Signed)
New Admission Note:   Arrival Method: Bed  Mental Orientation: A&O X4 Telemetry: Not ordered Assessment: Completed Skin: See flowsheets IV: Clean, Dry, Intact Pain: 7/10 Tubes: Female external catheter in place Safety Measures: Safety Fall Prevention Plan has been given, discussed and signed Admission: Completed Unit Orientation: Patient has been orientated to the room, unit and staff.  Family: N/A  Orders have been reviewed and implemented. Will continue to monitor the patient. Call light has been placed within reach and bed alarm has been activated.    Dixie Dials RN, BSN

## 2016-09-09 NOTE — H&P (Signed)
History and Physical    [name]:[id number] DOB: Mar 19, 1929 DOA: 09/09/2016  PCP: Celene Squibb, MD Patient coming from: [facility]  Chief Complaint: flank and abdominal pain  HPI: [name] [name] is a very pleasant 83 y.o. female with medical history significant for manic kidney disease stage IV, seizure disorder, hypertension, frequent UTIs, CAD status post CABG, hyperlipidemia, macular degeneration, bladder cancer, renal cancer status post right nephrectomy presents to the emergency department with the chief complaint of persistent and worsening flank pain. Initial evaluation includes a CT concerning for pyelonephritis analysis consistent with UTI.  Information is obtained from the patient. She states three-day history right flank pain. Yesterday developed some intermittent hematuria as well. She describes the pain as constant sharp and "shoots across my abdomen". Associated symptoms include nausea without vomiting. She denies diarrhea but does report a propensity for constipation. She denies headache dizziness fever chills syncope or near-syncope. She denies chest pain palpitation shortness of breath cough lower extremity edema or orthopnea. She also reports dysuria. States she's had pyelonephritis the past and symptoms were "exactly the same".    ED Course: Emergency department she's afebrile hemodynamically stable and not hypoxic. She is provided with Rocephin and fentanyl and Zofran  Review of Systems: As per HPI otherwise all other systems reviewed and are negative.   Ambulatory Status: She typically he relates with a walker at her assisted living facility. She denies any recent falls  Past Medical History:  Diagnosis Date  . Back pain   . Bladder cancer ([place])   . CAD (coronary artery disease)   . Dizziness   . 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 Essential hypertension, benign   . Mixed hyperlipidemia   . Obesity   . Postsurgical aortocoronary  bypass status   . Seizure [facility])     Past Surgical History:  Procedure Laterality Date  . CATARACT EXTRACTION    . CORONARY ARTERY BYPASS GRAFT    . IRIDECTOMY    . NEPHRECTOMY  2000    Social History   Social History  . Marital status: Widowed    Spouse name: N/A  . Number of children: 3  . Years of education: N/A   Occupational History  .      n/a   Social History Main Topics  . Smoking status: Never Smoker  . Smokeless tobacco: Never Used  . Alcohol use No  . Drug use: No  . Sexual activity: No   Other Topics Concern  . Not on file   Social History Narrative   Patient lives at home with his son and daughter in law.   Caffeine Use: 2 cups of caffeine daily.     Allergies  Allergen Reactions  . Adhesive [Tape] Other (See Comments)    Tears patients skin off.   [name] [Cephalexin] Nausea And Vomiting  . Latex Itching  . Penicillins Hives and Itching  . Sulfa Antibiotics Hives and Itching    Family History  Problem Relation Age of Onset  . Colon cancer Mother   . Heart attack Father   . Diabetes Father   . Stroke Unknown   . Coronary artery disease Unknown   . Cancer Unknown   . Diabetes Unknown     Prior to Admission medications   Medication Sig Start Date End Date Taking? Authorizing Provider  ALPRAZolam Duanne Moron) 0.5 MG tablet Take 0.125-0.5 mg by mouth 2 (two) times daily as needed for anxiety. For  nerves    [provider]  amLODipine (NORVASC) 5 MG tablet Take 5 mg by mouth daily.    [provider]  aspirin EC 81 MG tablet Take 81 mg by mouth at bedtime.     [provider]  AZO-CRANBERRY PO Take 2 capsules by mouth every morning.    [provider]  BELSOMRA 20 MG TABS Take 20 mg by mouth at bedtime.  12/15/14   [provider]  compounded topicals builder Apply 1 application topically 3 (three) times daily as needed. Guaif/cyclo/menth/ CREAM    [provider]  CRESTOR 20 MG tablet Take 1  tablet by mouth at bedtime.  01/06/11   [provider]  Diclofenac Sodium POWD Apply topically daily as needed.  04/04/16   [provider]  docusate sodium (COLACE) 100 MG capsule Take 200 mg by mouth every morning.    [provider]  fentaNYL (DURAGESIC - DOSED MCG/HR) 25 MCG/HR patch Place 25 mcg onto the skin every 3 (three) days.  11/27/14   [provider]  furosemide (LASIX) 20 MG tablet Take 20 mg by mouth every other day.  12/12/14   [provider]  gabapentin (NEURONTIN) 300 MG capsule Take 300 mg by mouth 2 (two) times daily.  11/20/15   [provider]  HYDROcodone-acetaminophen (NORCO) 10-325 MG per tablet Take 1 tablet by mouth every 4 (four) hours as needed for moderate pain or severe pain.     [provider]  l-methylfolate-B6-B12 (METANX) 3-35-2 MG TABS tablet Take 1 tablet by mouth at bedtime.    [provider]  levETIRAcetam (KEPPRA) 250 MG tablet Take 1 tablet (250 mg total) by mouth 2 (two) times daily. 12/20/15   Garvin Fila, MD  LINZESS 145 MCG CAPS capsule Take 145 mcg by mouth at bedtime.  12/18/14   [provider]  Magnesium 250 MG TABS Take 250 mg by mouth at bedtime.     [provider]  Multiple Vitamins-Minerals (ICAPS PO) Take 1 tablet by mouth at bedtime.    [provider]  MYRBETRIQ 25 MG TB24 tablet Take 25 mg by mouth at bedtime.  11/28/14   [provider]  nitroGLYCERIN (NITROSTAT) 0.4 MG SL tablet Place 0.4 mg under the tongue every 5 (five) minutes as needed for chest pain.    [provider]  NON FORMULARY 1-2 application by Pump Prime route 3 (three) times daily. COMPOUND CREAM: DIC/BAC/GAB/LID/PR/MEN    [provider]  potassium chloride SA (K-DUR,KLOR-CON) 20 MEQ tablet Take 2 tablets (40 mEq total) by mouth daily. 04/04/16 04/11/16  Mesner, Corene Cornea, MD  psyllium (HYDROCIL/METAMUCIL) 95 % PACK Take 1 packet by mouth every other day.     [provider]  VOLTAREN 1 % GEL Apply 2 g topically daily as needed (Muscle Pain).  08/20/12   [provider]    Physical Exam: Vitals:   09/09/16 0300 09/09/16 0330 09/09/16 0430 09/09/16 0500  BP: (!) 151/65 (!) 166/97 (!) 160/62 (!) 150/62  Pulse: 71 75 73 72  Resp: 14 15 17 14   Temp:      TempSrc:      SpO2: 96% 98% 98% 96%  Weight:      Height:         General:  Appears calm and comfortable, in no acute distress Eyes:  PERRL, EOMI, normal lids, iris ENT:  grossly normal hearing, lips & tongue, mucous membranes of her mouth are pink slightly  dry Neck:  no LAD, masses or thyromegaly Cardiovascular:  RRR, no m/r/g. No LE edema. Pedal pulses are present and palpable Respiratory:  CTA bilaterally, no w/r/r. Normal respiratory effort. Abdomen:  soft, nondistended positive bowel sounds generalized tenderness particularly on the right no guarding or rebounding Skin:  no rash or induration seen on limited exam Musculoskeletal:  grossly normal tone BUE/BLE, good ROM, no bony abnormality Psychiatric:  grossly normal mood and affect, speech fluent and appropriate, AOx3 Neurologic:  CN 2-12 grossly intact, moves all extremities in coordinated fashion, sensation intact alert and oriented 3 speech clear facial symmetry  Labs on Admission: I have personally reviewed following labs and imaging studies  CBC:  Recent Labs Lab 09/08/16 2049  WBC 6.8  NEUTROABS 2.3  HGB 12.2  HCT 37.6  MCV 91.3  PLT 353   Basic Metabolic Panel:  Recent Labs Lab 09/08/16 2049  NA 137  K 4.9  CL 101  CO2 27  GLUCOSE 108*  BUN 32*  CREATININE 2.05*  CALCIUM 9.3   GFR: Estimated Creatinine Clearance: 18.9 mL/min (A) (by C-G formula based on SCr of 2.05 mg/dL (H)). Liver Function Tests:  Recent Labs Lab 09/08/16 2049  AST 61*  ALT 15  ALKPHOS 64  BILITOT 1.7*  PROT 7.1  ALBUMIN 3.5   No results for input(s): LIPASE, AMYLASE in the last 168 hours. No results  for input(s): AMMONIA in the last 168 hours. Coagulation Profile: No results for input(s): INR, PROTIME in the last 168 hours. Cardiac Enzymes: No results for input(s): CKTOTAL, CKMB, CKMBINDEX, TROPONINI in the last 168 hours. BNP (last 3 results) No results for input(s): PROBNP in the last 8760 hours. HbA1C: No results for input(s): HGBA1C in the last 72 hours. CBG: No results for input(s): GLUCAP in the last 168 hours. Lipid Profile: No results for input(s): CHOL, HDL, LDLCALC, TRIG, CHOLHDL, LDLDIRECT in the last 72 hours. Thyroid Function Tests: No results for input(s): TSH, T4TOTAL, FREET4, T3FREE, THYROIDAB in the last 72 hours. Anemia Panel: No results for input(s): VITAMINB12, FOLATE, FERRITIN, TIBC, IRON, RETICCTPCT in the last 72 hours. Urine analysis:    Component Value Date/Time   COLORURINE YELLOW 09/08/2016 2055   APPEARANCEUR TURBID (A) 09/08/2016 2055   LABSPEC 1.011 09/08/2016 2055   PHURINE 7.0 09/08/2016 2055   GLUCOSEU NEGATIVE 09/08/2016 2055   HGBUR SMALL (A) 09/08/2016 2055   BILIRUBINUR NEGATIVE 09/08/2016 2055   KETONESUR NEGATIVE 09/08/2016 2055   PROTEINUR 30 (A) 09/08/2016 2055   UROBILINOGEN 0.2 06/11/2013 2102   NITRITE POSITIVE (A) 09/08/2016 2055   LEUKOCYTESUR LARGE (A) 09/08/2016 2055    Creatinine Clearance: Estimated Creatinine Clearance: 18.9 mL/min (A) (by C-G formula based on SCr of 2.05 mg/dL (H)).  Sepsis Labs: @LABRCNTIP (procalcitonin:4,lacticidven:4) )No results found for this or any previous visit (from the past 240 hour(s)).   Radiological Exams on Admission: Ct Abdomen Pelvis Wo Contrast  Result Date: 09/09/2016 CLINICAL DATA:  Abdominal pain. EXAM: CT ABDOMEN AND PELVIS WITHOUT CONTRAST TECHNIQUE: Multidetector CT imaging of the abdomen and pelvis was performed following the standard protocol without IV contrast. COMPARISON:  CT 02/06/2014 FINDINGS: Lower chest: Left basilar atelectasis with trace pleural effusion.  Atherosclerosis of the thoracic aorta. Coronary artery calcifications. Tiny subpleural calcified nodule in the right middle lobe. Hepatobiliary: No focal hepatic lesion allowing for lack contrast. Mild gallbladder distention without evidence of pericholecystic inflammation. No calcified gallstone. No biliary dilatation. Pancreas: Parenchymal atrophy. No ductal dilatation or inflammation. Spleen: Normal in size without focal  abnormality. Adrenals/Urinary Tract: Post right nephrectomy. No adrenal nodule. Mild left hydroureteronephrosis, ureter is dilated to the bladder insertion. Lobular urinary bladder with probable bladder diverticula, unchanged in morphologic appearance from prior exam. There is bladder wall thickening and perivesicular soft tissue stranding. Stomach/Bowel: Diverticulosis of the descending and sigmoid colon without diverticulitis. No small bowel inflammation, obstruction or wall thickening. Appendix is not confidently visualized. The stomach is physiologically distended. Vascular/Lymphatic: Dense aortic atherosclerosis without aneurysm. No adenopathy. Reproductive: Status post hysterectomy. No adnexal masses. Both ovaries are tentatively identified in unchanged in appearance. Other: No free air, free fluid, or intra-abdominal fluid collection. Small fat containing umbilical hernia. Laxity of the anterior abdominal wall. Musculoskeletal: Scoliosis and multilevel degenerative change in the spine. Remote right pubic rami fractures. Vertebral augmentation at L1. There are no acute or suspicious osseous abnormalities. IMPRESSION: 1. Bladder wall thickening with perivesicular soft tissue edema. Findings consistent with cystitis. Lobulated bladder contour is unchanged in morphology from prior exam. 2. Mild left hydroureteronephrosis, likely secondary to ascending infection. No urolithiasis or mechanical cause of urinary obstruction. 3. Distal colonic diverticulosis without diverticulitis. Aortic  atherosclerosis. Electronically Signed   By: Jeb Levering M.D.   On: 09/09/2016 04:43    EKG:   Assessment/Plan Principal Problem:   Pyelonephritis Active Problems:   Mixed hyperlipidemia   Essential hypertension, benign   Weakness generalized   Seizure disorder (HCC)   CAD (coronary artery disease)   Anxiety   Chronic abdominal pain   UTI (urinary tract infection)   CKD (chronic kidney disease), stage IV (HCC)   S/p nephrectomy   #1. Pyelonephritis. CT of the abdomen as noted above. She is afebrile no leukocytosis urinalysis consistent. Strong history of same in past -Admit to medical floor -Continue Rocephin -Follow urine culture -Gentle IV fluids -Supportive therapy -Monitor  #2. Urinary tract infection. Urinalysis as noted above. -Follow urine culture -Continue Rocephin  #3. Chronic kidney disease stage IV. Creatinine 2.09 on admission. This appears to be close to baseline. -Hold nephrotoxins -Gentle IV fluids -Recheck in the morning  #4. Generalized weakness. Likely related to above. -Physical therapy  #5. Seizure disorder. No recent seizure -Continue home meds  #6. CAD status post CABG. No chest pain. -continue home med  #7. Hypertension. Fair control in the emergency department. Home medications include amlodipine, Lasix -Hold Lasix for now -Continue amlodipine -Monitor  #8. Hyperlipidemia. -Statin  #9. Chronic abdominal pain. Home medications include, fentanyl patch, norco, gabapentin. -hold fentanyl until verified -continue norco and gabapentin    DVT prophylaxis: lovenox  Code Status: full  Family Communication: none present  Disposition Plan: back to facility  Consults called: none  Admission status: inpatient    Radene Gunning MD [facility]  If 7PM-7AM, please contact night-coverage [email or web address] Password [email or web address]  09/09/2016, 7:11 AM

## 2016-09-10 DIAGNOSIS — N184 Chronic kidney disease, stage 4 (severe): Secondary | ICD-10-CM

## 2016-09-10 DIAGNOSIS — I10 Essential (primary) hypertension: Secondary | ICD-10-CM

## 2016-09-10 DIAGNOSIS — N12 Tubulo-interstitial nephritis, not specified as acute or chronic: Principal | ICD-10-CM

## 2016-09-10 DIAGNOSIS — G40909 Epilepsy, unspecified, not intractable, without status epilepticus: Secondary | ICD-10-CM

## 2016-09-10 LAB — BASIC METABOLIC PANEL
ANION GAP: 10 (ref 5–15)
BUN: 22 mg/dL — ABNORMAL HIGH (ref 6–20)
CALCIUM: 8.9 mg/dL (ref 8.9–10.3)
CHLORIDE: 105 mmol/L (ref 101–111)
CO2: 25 mmol/L (ref 22–32)
Creatinine, Ser: 1.8 mg/dL — ABNORMAL HIGH (ref 0.44–1.00)
GFR calc non Af Amer: 24 mL/min — ABNORMAL LOW (ref >60)
GFR, EST AFRICAN AMERICAN: 28 mL/min — AB (ref >60)
Glucose, Bld: 96 mg/dL (ref 65–99)
POTASSIUM: 4.2 mmol/L (ref 3.5–5.1)
Sodium: 140 mmol/L (ref 135–145)

## 2016-09-10 LAB — CBC
HEMATOCRIT: 37.3 % (ref 36.0–46.0)
HEMOGLOBIN: 11.6 g/dL — AB (ref 12.0–15.0)
MCH: 28.8 pg (ref 26.0–34.0)
MCHC: 31.1 g/dL (ref 30.0–36.0)
MCV: 92.6 fL (ref 78.0–100.0)
Platelets: 181 10*3/uL (ref 150–400)
RBC: 4.03 MIL/uL (ref 3.87–5.11)
RDW: 14.8 % (ref 11.5–15.5)
WBC: 5.8 10*3/uL (ref 4.0–10.5)

## 2016-09-10 NOTE — Evaluation (Signed)
Physical Therapy Evaluation Patient Details Name: [name] [name] MRN: [id number] DOB: [birth year] Today's Date: 09/10/2016   History of Present Illness  [name] [name] is a very pleasant 83 y.o. female with medical history significant for manic kidney disease stage IV, seizure disorder, hypertension, frequent UTIs, CAD status post CABG, hyperlipidemia, macular degeneration, bladder cancer, renal cancer status post right nephrectomy presents to the emergency department with the chief complaint of persistent and worsening flank pain. Initial evaluation includes a CT concerning for pyelonephritis analysis consistent with UTI.  Clinical Impression  Pt admitted with above diagnosis. Pt currently with functional limitations due to the deficits listed below (see PT Problem List). Pt currently very weak with poor balance in standing, multiple LOB with min A to correct while up, and continued right side of pain that is debilitating for her. Per her report, she does not have help at ALF with bathing, dressing, ambulating to meals and she would not currently be able to do these things safely. Therefore, currently recommending SNF for higher level care.  Pt will benefit from skilled PT to increase their independence and safety with mobility to allow discharge to the venue listed below.       Follow Up Recommendations SNF    Equipment Recommendations  None recommended by PT    Recommendations for Other Services       Precautions / Restrictions Precautions Precautions: Fall Restrictions Weight Bearing Restrictions: No      Mobility  Bed Mobility Overal bed mobility: Needs Assistance Bed Mobility: Supine to Sit     Supine to sit: Min assist     General bed mobility comments: pt very painful with mobility, min A and use of rail due to pain  Transfers Overall transfer level: Needs assistance Equipment used: Rolling walker (2 wheeled) Transfers: Sit to/from Stand Sit to Stand: Min guard          General transfer comment: increased time needed and supervision for safety  Ambulation/Gait Ambulation/Gait assistance: Min assist Ambulation Distance (Feet): 15 Feet Assistive device: Rolling walker (2 wheeled) Gait Pattern/deviations: Step-through pattern;Decreased stride length;Trunk flexed Gait velocity: very slow Gait velocity interpretation: <1.8 ft/sec, indicative of risk for recurrent falls General Gait Details: pt could not tolerate ambulation more than 15' due to pain, very unsteady, even with RW, several LOB with min A to correct  Stairs            Wheelchair Mobility    Modified Rankin (Stroke Patients Only)       Balance Overall balance assessment: Needs assistance Sitting-balance support: No upper extremity supported Sitting balance-Leahy Scale: Fair     Standing balance support: Single extremity supported Standing balance-Leahy Scale: Poor Standing balance comment: lost balance posterior 3x when let go of RW with one hand to brush hair. Min A to maintain standing balance                             Pertinent Vitals/Pain Pain Assessment: 0-10 Pain Score: 8  Pain Location: right side and back Pain Descriptors / Indicators: Aching Pain Intervention(s): Limited activity within patient's tolerance;Monitored during session    [place] expects to be discharged to:: Assisted living Living Arrangements: Alone             Home Equipment: Walker - 4 wheels Additional Comments: pt lives at [facility] but reports that they are not able to help her with any self care activities, she walks  to the dining room by herself as well as walking to the nurses station to get her meds because she says they will not bring them to her.     Prior Function Level of Independence: Independent with assistive device(s)         Comments: RW     Hand Dominance        Extremity/Trunk Assessment   Upper Extremity Assessment Upper  Extremity Assessment: Generalized weakness    Lower Extremity Assessment Lower Extremity Assessment: Generalized weakness    Cervical / Trunk Assessment Cervical / Trunk Assessment: Kyphotic  Communication   Communication: No difficulties  Cognition Arousal/Alertness: Awake/alert Behavior During Therapy: WFL for tasks assessed/performed Overall Cognitive Status: Within Functional Limits for tasks assessed                                        General Comments      Exercises     Assessment/Plan    PT Assessment Patient needs continued PT services  PT Problem List Decreased strength;Decreased balance;Decreased mobility;Decreased knowledge of use of DME;Decreased knowledge of precautions;Pain       PT Treatment Interventions DME instruction;Gait training;Functional mobility training;Therapeutic activities;Therapeutic exercise;Balance training;Patient/family education    PT Goals (Current goals can be found in the Care Plan section)  Acute Rehab PT Goals Patient Stated Goal: be safer PT Goal Formulation: With patient Time For Goal Achievement: 09/24/16 Potential to Achieve Goals: Good    Frequency Min 3X/week   Barriers to discharge Decreased caregiver support      Co-evaluation               AM-PAC PT "6 Clicks" Daily Activity  Outcome Measure Difficulty turning over in bed (including adjusting bedclothes, sheets and blankets)?: Total Difficulty moving from lying on back to sitting on the side of the bed? : Total Difficulty sitting down on and standing up from a chair with arms (e.g., wheelchair, bedside commode, etc,.)?: Total Help needed moving to and from a bed to chair (including a wheelchair)?: A Little Help needed walking in hospital room?: A Lot Help needed climbing 3-5 steps with a railing? : A Lot 6 Click Score: 10    End of Session Equipment Utilized During Treatment: Gait belt Activity Tolerance: Patient limited by pain;Patient  limited by fatigue Patient left: in chair;with call bell/phone within reach [facility]) Nurse Communication: Mobility status (pt on [facility]) PT Visit Diagnosis: Unsteadiness on feet (R26.81);Muscle weakness (generalized) (M62.81);Pain Pain - Right/Left: Right Pain - part of body:  (abdomen)    Time: 6712-4580 PT Time Calculation (min) (ACUTE ONLY): 23 min   Charges:   PT Evaluation $PT Eval Moderate Complexity: 1 Procedure PT Treatments $Gait Training: 8-22 mins   PT G Codes:        Leighton Roach, PT  Acute Rehab Services  [place] 09/10/2016, 2:08 PM

## 2016-09-10 NOTE — Discharge Summary (Signed)
Physician Discharge Summary  [name] [name] VOH:[id number] DOB: Oct 02, 1928 DOA: 09/09/2016  PCP: Celene Squibb, MD  Admit date: 09/09/2016 Discharge date: 09/11/2016  Admitted From: [place] Disposition: [facility]  Recommendations for Outpatient Follow-up:  1. Follow up with PCP in 1-2 weeks 2. Follow up with nephrology as scheduled. Discharge Cr 1.8.  3. Complete treatment for pyelonephritis with vantin 200mg  daily (due to renal function) x 10 total days, last dose 6/14.   Home Health: PT reordered Equipment/Devices: None Discharge Condition: Stable CODE STATUS: Full Diet recommendation: Heart healthy  Brief/Interim Summary: [name] [name] is a pleasant 83 y.o. female with a history of stage IV CKD, seizure disorder, HTN, recurrent UTI, renal cancer s/p right nephrectomy, CAD s/p CABG who presented from ALF due to right flank/abdominal pain and dysuria associated with nausea without emesis. Urinalysis concerning for recurrent UTI and CT abdomen showing mild left pyelonephritis. She was transferred to [facility], hemodynamically stable, afebrile with no leukocytosis. Ceftriaxone was given 6/5 and 6/6 and urine culture has remained no growth to date. She has been evaluated by PT, who initially felt she required SNF, though with treatment her strength improved and she is felt stable for discharge back to ALF with continuation of home health physical therapy. She is discharged in stable condition to complete a course of cefpodoxime.  Discharge Diagnoses:  Principal Problem:   Pyelonephritis Active Problems:   Mixed hyperlipidemia   Essential hypertension, benign   Weakness generalized   Seizure disorder (HCC)   CAD (coronary artery disease)   Anxiety   Chronic abdominal pain   UTI (urinary tract infection)   CKD (chronic kidney disease), stage IV (HCC)   S/p nephrectomy  Pyelonephritis due to E. coli: As indicated by mild findings on CT abdomen. Pt is afebrile  without SIRS, no leukocytosis. Taking po at baseline. No evidence of AKI.  - Improved with ceftriaxone. Given this improvement, her tolerance of cefpodoxime in the past, and the urine culture sensitivities (as below), which are consistent with 2 prior urine cultures available in EMR indicating sensitivity to cephalosporins, she will be discharged on renally-dosed cefpodoxime x 10 total days (last daily dose 6/14).   Chronic kidney disease stage IV. Creatinine 2.09 on admission improved to 1.8, at baseline.  - Monitor at follow up  Generalized fatigue: No weakness on exam. Likely related to above. - Recommend ongoing physical therapy for functional mobility.   Seizure disorder. No recent seizure - Continue keppra  CAD status post CABG. No chest pain. - continue home meds  Hypertension. Fair control in the emergency department.  - Continue amlodipine, records indicate lasix 20mg  every other day was discontinued and pt is taking as needed. This is held at discharge, as is potassium supplement. Consider restarting as needed.  Hyperlipidemia. - Continue statin  Chronic abdominal pain. Home medications include, fentanyl patch, norco, gabapentin. Stable. No indications for further evaluation based on CT abdomen. - Continue home medications. - Taking oxycodone 10/325mg  QID scheduled. Would consider ER dosing as outpatient.   Discharge Instructions  Allergies as of 09/11/2016      Reactions   Adhesive [tape] Other (See Comments)   Tears patients skin off.    Keflet [cephalexin] Nausea And Vomiting   Latex Itching   Penicillins Hives, Itching   Sulfa Antibiotics Hives, Itching      Medication List    STOP taking these medications   potassium chloride SA 20 MEQ tablet Commonly known as:  K-DUR,KLOR-CON  TAKE these medications   ALPRAZolam 0.5 MG tablet Commonly known as:  XANAX Take 0.5-1 tablets (0.25-0.5 mg total) by mouth 2 (two) times daily as needed for anxiety (or  agitation). For nerves   amLODipine 5 MG tablet Commonly known as:  NORVASC Take 2.5 mg by mouth daily.   BELSOMRA 20 MG Tabs Generic drug:  Suvorexant Take 20 mg by mouth at bedtime.   cefpodoxime 200 MG tablet Commonly known as:  VANTIN Take 1 tablet (200 mg total) by mouth daily.   CRESTOR 20 MG tablet Generic drug:  rosuvastatin Take 20 mg by mouth daily.   FOLTANX RF 3-90.314-2-35 MG Caps Take 1 capsule by mouth daily.   gabapentin 300 MG capsule Commonly known as:  NEURONTIN Take 300 mg by mouth 2 (two) times daily.   HYDROcodone-acetaminophen 10-325 MG tablet Commonly known as:  NORCO Take 1 tablet by mouth 4 (four) times daily.   levETIRAcetam 250 MG tablet Commonly known as:  KEPPRA Take 1 tablet (250 mg total) by mouth 2 (two) times daily.   LINZESS 145 MCG Caps capsule Generic drug:  linaclotide Take 145 mcg by mouth at bedtime.   Magnesium 250 MG Tabs Take 250 mg by mouth daily.   MYRBETRIQ 25 MG Tb24 tablet Generic drug:  mirabegron ER Take 25 mg by mouth at bedtime.   nitroGLYCERIN 0.4 MG SL tablet Commonly known as:  NITROSTAT Place 0.4 mg under the tongue every 5 (five) minutes as needed for chest pain.   polyethylene glycol packet Commonly known as:  MIRALAX / GLYCOLAX Take 17 g by mouth daily as needed for moderate constipation.   PRESERVISION AREDS PO Take 1 capsule by mouth daily.      Follow-up Information    Estanislado Emms, MD Follow up.   Specialty:  Nephrology Contact information: [place]  [address] [phone]        Celene Squibb, MD .   Specialty:  Internal Medicine Contact information: [place] [address] [phone]          Allergies  Allergen Reactions  . Adhesive [Tape] Other (See Comments)    Tears patients skin off.   [name] [Cephalexin] Nausea And Vomiting  . Latex Itching  . Penicillins Hives and Itching  . Sulfa Antibiotics Hives  and Itching    Consultations:  None  Procedures/Studies: Ct Abdomen Pelvis Wo Contrast  Result Date: 09/09/2016 CLINICAL DATA:  Abdominal pain. EXAM: CT ABDOMEN AND PELVIS WITHOUT CONTRAST TECHNIQUE: Multidetector CT imaging of the abdomen and pelvis was performed following the standard protocol without IV contrast. COMPARISON:  CT 02/06/2014 FINDINGS: Lower chest: Left basilar atelectasis with trace pleural effusion. Atherosclerosis of the thoracic aorta. Coronary artery calcifications. Tiny subpleural calcified nodule in the right middle lobe. Hepatobiliary: No focal hepatic lesion allowing for lack contrast. Mild gallbladder distention without evidence of pericholecystic inflammation. No calcified gallstone. No biliary dilatation. Pancreas: Parenchymal atrophy. No ductal dilatation or inflammation. Spleen: Normal in size without focal abnormality. Adrenals/Urinary Tract: Post right nephrectomy. No adrenal nodule. Mild left hydroureteronephrosis, ureter is dilated to the bladder insertion. Lobular urinary bladder with probable bladder diverticula, unchanged in morphologic appearance from prior exam. There is bladder wall thickening and perivesicular soft tissue stranding. Stomach/Bowel: Diverticulosis of the descending and sigmoid colon without diverticulitis. No small bowel inflammation, obstruction  or wall thickening. Appendix is not confidently visualized. The stomach is physiologically distended. Vascular/Lymphatic: Dense aortic atherosclerosis without aneurysm. No adenopathy. Reproductive: Status post hysterectomy. No adnexal masses. Both ovaries are tentatively identified in unchanged in appearance. Other: No free air, free fluid, or intra-abdominal fluid collection. Small fat containing umbilical hernia. Laxity of the anterior abdominal wall. Musculoskeletal: Scoliosis and multilevel degenerative change in the spine. Remote right pubic rami fractures. Vertebral augmentation at L1. There are no  acute or suspicious osseous abnormalities. IMPRESSION: 1. Bladder wall thickening with perivesicular soft tissue edema. Findings consistent with cystitis. Lobulated bladder contour is unchanged in morphology from prior exam. 2. Mild left hydroureteronephrosis, likely secondary to ascending infection. No urolithiasis or mechanical cause of urinary obstruction. 3. Distal colonic diverticulosis without diverticulitis. Aortic atherosclerosis. Electronically Signed   By: Jeb Levering M.D.   On: 09/09/2016 04:43   Subjective: Pt tired, did not sleep well in the hospital last night. No fevers, chills, nausea or vomiting. Eating breakfast.   Discharge Exam: BP (!) 135/92 (BP Location: Right Arm)   Pulse 63   Temp 97.8 F (36.6 C) (Oral)   Resp 16   Ht 5\' 2"  (1.575 m)   Wt 74.4 kg (164 lb)   SpO2 95%   BMI 30.00 kg/m   General: Elderly F in no distress Cardiovascular: RRR, no murmur, no JVD, no edema Respiratory: Nonlabored, clear Abdominal: Soft, mildly diffusely tender without rebound. Mild left CVA tenderness  Labs: Basic Metabolic Panel:  Recent Labs Lab 09/08/16 2049 09/10/16 0639  NA 137 140  K 4.9 4.2  CL 101 105  CO2 27 25  GLUCOSE 108* 96  BUN 32* 22*  CREATININE 2.05* 1.80*  CALCIUM 9.3 8.9   Liver Function Tests:  Recent Labs Lab 09/08/16 2049  AST 61*  ALT 15  ALKPHOS 64  BILITOT 1.7*  PROT 7.1  ALBUMIN 3.5   CBC:  Recent Labs Lab 09/08/16 2049 09/10/16 0639  WBC 6.8 5.8  NEUTROABS 2.3  --   HGB 12.2 11.6*  HCT 37.6 37.3  MCV 91.3 92.6  PLT 223 181   Urinalysis    Component Value Date/Time   COLORURINE YELLOW 09/08/2016 2055   APPEARANCEUR TURBID (A) 09/08/2016 2055   LABSPEC 1.011 09/08/2016 2055   PHURINE 7.0 09/08/2016 2055   GLUCOSEU NEGATIVE 09/08/2016 2055   HGBUR SMALL (A) 09/08/2016 2055   BILIRUBINUR NEGATIVE 09/08/2016 2055   KETONESUR NEGATIVE 09/08/2016 2055   PROTEINUR 30 (A) 09/08/2016 2055   UROBILINOGEN 0.2 06/11/2013  2102   NITRITE POSITIVE (A) 09/08/2016 2055   LEUKOCYTESUR LARGE (A) 09/08/2016 2055    Microbiology Recent Results (from the past 240 hour(s))  Urine culture     Status: Abnormal   Collection Time: 09/08/16  8:55 PM  Result Value Ref Range Status   Specimen Description URINE, RANDOM  Final   Special Requests NONE  Final   Culture >=100,000 COLONIES/mL ESCHERICHIA COLI (A)  Final   Report Status 09/11/2016 FINAL  Final   Organism ID, Bacteria ESCHERICHIA COLI (A)  Final      Susceptibility   Escherichia coli - MIC*    AMPICILLIN >=32 RESISTANT Resistant     CEFAZOLIN 16 SENSITIVE Sensitive     CEFTRIAXONE <=1 SENSITIVE Sensitive     CIPROFLOXACIN >=4 RESISTANT Resistant     GENTAMICIN <=1 SENSITIVE Sensitive     IMIPENEM <=0.25 SENSITIVE Sensitive     NITROFURANTOIN 128 RESISTANT Resistant     TRIMETH/SULFA <=20 SENSITIVE Sensitive  AMPICILLIN/SULBACTAM 16 INTERMEDIATE Intermediate     PIP/TAZO <=4 SENSITIVE Sensitive     Extended ESBL NEGATIVE Sensitive     * >=100,000 COLONIES/mL ESCHERICHIA COLI  MRSA PCR Screening     Status: None   Collection Time: 09/09/16 11:46 AM  Result Value Ref Range Status   MRSA by PCR NEGATIVE NEGATIVE Final    Comment:        The GeneXpert MRSA Assay (FDA approved for NASAL specimens only), is one component of a comprehensive MRSA colonization surveillance program. It is not intended to diagnose MRSA infection nor to guide or monitor treatment for MRSA infections.     Time coordinating discharge: Approximately 40 minutes  Vance Gather, MD  [facility] 09/11/2016, 9:42 AM Pager [phone]

## 2016-09-10 NOTE — Progress Notes (Signed)
PROGRESS NOTE  [name] [name]  QQV:[id number] DOB: 08/22/1928 DOA: 09/09/2016 PCP: Celene Squibb, MD   Brief Narrative: [name] Fonseca Popeis a pleasant 83 y.o.femalewith a history of stage IV CKD, seizure disorder, HTN, recurrent UTI, renal cancer s/p right nephrectomy, CAD s/p CABG who presented from ALF due to right flank/abdominal pain and dysuria associated with nausea without emesis. Urinalysis concerning for recurrent UTI and CT abdomen showing cystitis and mild left pyelonephritis. She was transferred to [facility], hemodynamically stable, afebrile with no leukocytosis. Ceftriaxone was given and urine culture has not returned. She has been evaluated by PT, who recommends SNF due to weakness. Plan is to continue PT and treatment of pyelonephritis, the likely cause of weakness.   Assessment & Plan: Principal Problem:   Pyelonephritis Active Problems:   Mixed hyperlipidemia   Essential hypertension, benign   Weakness generalized   Seizure disorder (HCC)   CAD (coronary artery disease)   Anxiety   Chronic abdominal pain   UTI (urinary tract infection)   CKD (chronic kidney disease), stage IV (HCC)   S/p nephrectomy  Pyelonephritis: As indicated by mild findings on CT abdomen. Pt is afebrile without SIRS, no leukocytosis. Taking poor po at baseline. No evidence of AKI.  - Urine culture with GNR's. Given her tolerance of cefpodoxime in the past, and the 2 prior urine cultures available in EMR indicating sensitivity to cephalosporins, considering discharge on renally-dosed cefpodoxime unless urine culture data is informative. - Continue ceftriaxone.   Chronic kidney disease stage IV. Creatinine 2.09 on admission improved to 1.8, at baseline.  - Monitor at follow up  Generalized fatigue: No focal weakness on exam. Likely related to above. - Physical therapy recommends SNF. Anticipate weakness to improve with Tx of pyelonephritis.  Seizure disorder. No recent seizure - Continue home meds  CAD  status post CABG. No chest pain. - Continue home meds  Hypertension. Borderline hypotensive with widened pulse pressures. - Continue amlodipine, holding lasix given hypotension, euvolemic.  Hyperlipidemia. - Continue statin  Chronic abdominal pain. Home medications include, fentanyl patch, norco, gabapentin. Stable. No indications for further evaluation based on CT abdomen. - Continue home medications.  DVT prophylaxis: Lovenox Code Status: Full Family Communication: None at bedside Disposition Plan: Unclear. Will reevaluate 6/7 with more Tx of acute conditions. If remains too weak for return to assisted living, will discharge to SNF for short term rehabilitation.   Consultants:   None  Procedures:   None  Antimicrobials:  Ceftriaxone 6/5 >>    Subjective: Pt feels improved dysuria, though still some burning. Abdominal pain stable, but very very weak. No fevers, chills, N/V/D.  Objective: Vitals:   09/09/16 1707 09/09/16 2149 09/10/16 0548 09/10/16 1012  BP: (!) 135/48 (!) 156/57 (!) 125/51 (!) 156/56  Pulse: 66 74 63 72  Resp: 16 16 (!) 63 16  Temp: 97.6 F (36.4 C) 97.7 F (36.5 C) 98.3 F (36.8 C) 98.2 F (36.8 C)  TempSrc: Oral   Oral  SpO2: 97% 96% 95% 97%  Weight:  76 kg (167 lb 8.8 oz)    Height:        Intake/Output Summary (Last 24 hours) at 09/10/16 1510 Last data filed at 09/10/16 1444  Gross per 24 hour  Intake          1310.01 ml  Output              800 ml  Net           510.01 ml  Filed Weights   09/08/16 2035 09/09/16 2149  Weight: 77.1 kg (170 lb) 76 kg (167 lb 8.8 oz)    Examination: General exam: 83 y.o. female in no distress Respiratory system: Non-labored breathing room air. Clear to auscultation bilaterally.  Cardiovascular system: Regular rate and rhythm. No murmur, rub, or gallop. No JVD, and no pedal edema. Gastrointestinal system: Abdomen soft, diffusely R > L tender without rebound or guarding. +bilateral CVA tenderness  (possibly musculoskeletal), non-distended, with normoactive bowel sounds. No organomegaly or masses felt. Central nervous system: Alert and oriented. No focal neurological deficits. Extremities: Warm, no deformities Skin: No rashes, lesions ulcers Psychiatry: Judgement and insight appear normal. Mood & affect appropriate.   Data Reviewed: I have personally reviewed following labs and imaging studies  CBC:  Recent Labs Lab 09/08/16 2049 09/10/16 0639  WBC 6.8 5.8  NEUTROABS 2.3  --   HGB 12.2 11.6*  HCT 37.6 37.3  MCV 91.3 92.6  PLT 223 166   Basic Metabolic Panel:  Recent Labs Lab 09/08/16 2049 09/10/16 0639  NA 137 140  K 4.9 4.2  CL 101 105  CO2 27 25  GLUCOSE 108* 96  BUN 32* 22*  CREATININE 2.05* 1.80*  CALCIUM 9.3 8.9   GFR: Estimated Creatinine Clearance: 21.4 mL/min (A) (by C-G formula based on SCr of 1.8 mg/dL (H)). Liver Function Tests:  Recent Labs Lab 09/08/16 2049  AST 61*  ALT 15  ALKPHOS 64  BILITOT 1.7*  PROT 7.1  ALBUMIN 3.5   No results for input(s): LIPASE, AMYLASE in the last 168 hours. No results for input(s): AMMONIA in the last 168 hours. Coagulation Profile: No results for input(s): INR, PROTIME in the last 168 hours. Cardiac Enzymes: No results for input(s): CKTOTAL, CKMB, CKMBINDEX, TROPONINI in the last 168 hours. BNP (last 3 results) No results for input(s): PROBNP in the last 8760 hours. HbA1C: No results for input(s): HGBA1C in the last 72 hours. CBG: No results for input(s): GLUCAP in the last 168 hours. Lipid Profile: No results for input(s): CHOL, HDL, LDLCALC, TRIG, CHOLHDL, LDLDIRECT in the last 72 hours. Thyroid Function Tests: No results for input(s): TSH, T4TOTAL, FREET4, T3FREE, THYROIDAB in the last 72 hours. Anemia Panel: No results for input(s): VITAMINB12, FOLATE, FERRITIN, TIBC, IRON, RETICCTPCT in the last 72 hours. Urine analysis:    Component Value Date/Time   COLORURINE YELLOW 09/08/2016 2055    APPEARANCEUR TURBID (A) 09/08/2016 2055   LABSPEC 1.011 09/08/2016 2055   PHURINE 7.0 09/08/2016 2055   GLUCOSEU NEGATIVE 09/08/2016 2055   HGBUR SMALL (A) 09/08/2016 2055   BILIRUBINUR NEGATIVE 09/08/2016 2055   [place] 09/08/2016 2055   PROTEINUR 30 (A) 09/08/2016 2055   UROBILINOGEN 0.2 06/11/2013 2102   NITRITE POSITIVE (A) 09/08/2016 2055   LEUKOCYTESUR LARGE (A) 09/08/2016 2055   Recent Results (from the past 240 hour(s))  Urine culture     Status: Abnormal (Preliminary result)   Collection Time: 09/08/16  8:55 PM  Result Value Ref Range Status   Specimen Description URINE, RANDOM  Final   Special Requests NONE  Final   Culture >=100,000 COLONIES/mL GRAM NEGATIVE RODS (A)  Final   Report Status PENDING  Incomplete  MRSA PCR Screening     Status: None   Collection Time: 09/09/16 11:46 AM  Result Value Ref Range Status   MRSA by PCR NEGATIVE NEGATIVE Final    Comment:        The GeneXpert MRSA Assay (FDA approved for NASAL specimens  only), is one component of a comprehensive MRSA colonization surveillance program. It is not intended to diagnose MRSA infection nor to guide or monitor treatment for MRSA infections.       Radiology Studies: Ct Abdomen Pelvis Wo Contrast  Result Date: 09/09/2016 CLINICAL DATA:  Abdominal pain. EXAM: CT ABDOMEN AND PELVIS WITHOUT CONTRAST TECHNIQUE: Multidetector CT imaging of the abdomen and pelvis was performed following the standard protocol without IV contrast. COMPARISON:  CT 02/06/2014 FINDINGS: Lower chest: Left basilar atelectasis with trace pleural effusion. Atherosclerosis of the thoracic aorta. Coronary artery calcifications. Tiny subpleural calcified nodule in the right middle lobe. Hepatobiliary: No focal hepatic lesion allowing for lack contrast. Mild gallbladder distention without evidence of pericholecystic inflammation. No calcified gallstone. No biliary dilatation. Pancreas: Parenchymal atrophy. No ductal dilatation  or inflammation. Spleen: Normal in size without focal abnormality. Adrenals/Urinary Tract: Post right nephrectomy. No adrenal nodule. Mild left hydroureteronephrosis, ureter is dilated to the bladder insertion. Lobular urinary bladder with probable bladder diverticula, unchanged in morphologic appearance from prior exam. There is bladder wall thickening and perivesicular soft tissue stranding. Stomach/Bowel: Diverticulosis of the descending and sigmoid colon without diverticulitis. No small bowel inflammation, obstruction or wall thickening. Appendix is not confidently visualized. The stomach is physiologically distended. Vascular/Lymphatic: Dense aortic atherosclerosis without aneurysm. No adenopathy. Reproductive: Status post hysterectomy. No adnexal masses. Both ovaries are tentatively identified in unchanged in appearance. Other: No free air, free fluid, or intra-abdominal fluid collection. Small fat containing umbilical hernia. Laxity of the anterior abdominal wall. Musculoskeletal: Scoliosis and multilevel degenerative change in the spine. Remote right pubic rami fractures. Vertebral augmentation at L1. There are no acute or suspicious osseous abnormalities. IMPRESSION: 1. Bladder wall thickening with perivesicular soft tissue edema. Findings consistent with cystitis. Lobulated bladder contour is unchanged in morphology from prior exam. 2. Mild left hydroureteronephrosis, likely secondary to ascending infection. No urolithiasis or mechanical cause of urinary obstruction. 3. Distal colonic diverticulosis without diverticulitis. Aortic atherosclerosis. Electronically Signed   By: Jeb Levering M.D.   On: 09/09/2016 04:43    Scheduled Meds: . amLODipine  5 mg Oral Daily  . aspirin EC  81 mg Oral QHS  . docusate sodium  200 mg Oral q morning - 10a  . enoxaparin (LOVENOX) injection  30 mg Subcutaneous Q24H  . furosemide  20 mg Oral QODAY  . gabapentin  300 mg Oral BID  . l-methylfolate-B6-B12  1 tablet  Oral QHS  . levETIRAcetam  250 mg Oral BID  . linaclotide  145 mcg Oral QHS  . magnesium oxide  200 mg Oral QHS  . mirabegron ER  25 mg Oral QHS  . rosuvastatin  20 mg Oral QHS   Continuous Infusions: . cefTRIAXone (ROCEPHIN)  IV Stopped (09/10/16 0630)     LOS: 1 day   Time spent: 25 minutes.  Vance Gather, MD [facility] Pager [phone]  If 7PM-7AM, please contact night-coverage [email or web address] Password [email or web address] 09/10/2016, 3:10 PM

## 2016-09-10 NOTE — Progress Notes (Signed)
Dr. Bonner Puna retured page. OK to leave out female catheter at this time.

## 2016-09-10 NOTE — Clinical Social Work Note (Signed)
Clinical Social Work Assessment  Patient Details  Name: [name] [name] MRN: [id number] Date of Birth: [birth year]  Date of referral:  09/10/16               Reason for consult:  Facility Placement                Permission sought to share information with:  Family Supports Permission granted to share information::  Yes, Verbal Permission Granted  Name::     Lavella Hammock  Agency::     Relationship::  Sister  Contact Information:  [phone]  Housing/Transportation Living arrangements for the past 2 months:  [place], [place] (Patient was living with her sister and moved to [place] one month ago.) Source of Information:  Patient Patient Interpreter Needed:  None Criminal Activity/Legal Involvement Pertinent to Current Situation/Hospitalization:  No - Comment as needed Significant Relationships:  Adult Children, Siblings Lives with:  Facility Resident ([place]) Do you feel safe going back to the place where you live?  Yes Need for family participation in patient care:  Yes (Comment)  Care giving concerns:  Patient reported that she has been a resident at [facility] a month, moving there as her brother-in-law wanted her out of their home. Her sister helped find facility, and per patient, it is very close to her sister Carolyn's home.  Social Worker assessment / plan:  CSW talked with patient at the bedside regarding discharge disposition. Ms. Gaines was lying in bed and was awake, alert, oriented and agreeable to talking with CSW. Reason for visit explained, along with PT eval recommending SNF. CSW talked with patient regarding skilled facilities and what they provided in terms of rehab and nursing services. Patient reported that she was receiving PT services 3 days a week at [facility] and does not think she can tolerate 5 or more days of rehab.   Patient talked with CSW regarding her family. Her sister Hoyle Sauer lives very close  to ALF and she has 3 other sisters: Celene Squibb and a sister who lives in [place]. Patient also has a brother that lives in [place], however per patient, they don't have contact like they should.  Patient has 2 sons that live in [place]: Cyndi Bender and Darcel Smalling, and one son that lives in [place], [place]. Patient reported that she raised her sons in [place], moved to [facility] and lived there for 51 years and moved back to [facility] in 2005.   Employment status:    Insurance information:  Biomedical scientist) PT Recommendations:  [place] / Referral to community resources:  [place] (SNF information not provided as patient wants to return to ALF)  Patient/Family's Response to care:  No concerns expressed by patient regarding care during hospitalization.  Patient/Family's Understanding of and Emotional Response to Diagnosis, Current Treatment, and Prognosis:  Not discussed.  Emotional Assessment Appearance:  Appears older than stated age Attitude/Demeanor/Rapport:  Other (Appropriate) Affect (typically observed):  Appropriate, Pleasant Orientation:  Oriented to Self, Oriented to Place, Oriented to  Time, Oriented to Situation Alcohol / Substance use:  Never Used Psych involvement (Current and /or in the community):  No (Comment)  Discharge Needs  Concerns to be addressed:  No discharge needs identified Readmission within the last 30 days:  No Current discharge risk:  None Barriers to Discharge:  No Barriers Identified   Sable Feil, LCSW 09/10/2016, 3:47 PM

## 2016-09-10 NOTE — Clinical Social Work Note (Signed)
Completed assessement with patient regarding discharge disposition and advised patient of SNF recommendation for short-term rehab. [name] [name] was receiving 3 days of therapy per week before coming to hospital and wants to return to [facility] and continue this therapy. Patient explained that she does not feel she can tolerate 5 days or more of therapy and wants to return to ALF. CSW will contact facility on Thursday and talk with them regarding patient and SNF recommendation to determine if they will come and evaluate patient to assure that they can appropriately care for patient at discharge.  [name] [name], MSW, LCSW Licensed Clinical Social Worker [place] [phone]

## 2016-09-10 NOTE — Progress Notes (Signed)
Pt would like to leave new female catheter out for awhile.  Pt states it is irritating vagina at this time.  Paged Dr. Bonner Puna FTI.

## 2016-09-11 DIAGNOSIS — Z905 Acquired absence of kidney: Secondary | ICD-10-CM

## 2016-09-11 DIAGNOSIS — E782 Mixed hyperlipidemia: Secondary | ICD-10-CM

## 2016-09-11 DIAGNOSIS — R531 Weakness: Secondary | ICD-10-CM

## 2016-09-11 LAB — URINE CULTURE: Culture: >=100000 — AB

## 2016-09-11 MED ORDER — CEFPODOXIME PROXETIL 200 MG PO TABS: 200.0000 mg | ORAL_TABLET | ORAL | 0 refills | Status: DC

## 2016-09-11 MED ORDER — ALPRAZOLAM 0.5 MG PO TABS: 0.2500 mg | ORAL_TABLET | Freq: Two times a day (BID) | ORAL | 0 refills | Status: DC | PRN

## 2016-09-11 MED ORDER — CEFPODOXIME PROXETIL 200 MG PO TABS
200.0000 mg | ORAL_TABLET | ORAL | Status: DC
  Filled 2016-09-11: qty 1

## 2016-09-11 MED ORDER — HYDROCODONE-ACETAMINOPHEN 10-325 MG PO TABS: 1.0000 | ORAL_TABLET | Freq: Four times a day (QID) | ORAL | 0 refills | Status: DC

## 2016-09-11 NOTE — Care Management Note (Addendum)
Case Management Note  Patient Details  Name: [name] [name] MRN: [id number] Date of Birth: 01-23-1929  Subjective/Objective:        CM following for progression and d/c planning.             Action/Plan: 09/11/2016 Pt is resident of [facility] and will return to that facility. This CM spoke with Mrs Pamella Pert , [facility] [place] who states that they will arrange the HHPT with [facility]( [facility]) upon pt return to [facility]. Demographic sheet, order for HHPT and face to face copied and sent with pt d/c info via the pt daughter.   Expected Discharge Date:  09/11/16               Expected Discharge Plan:  Assisted Living / Rest Home  In-House Referral:  Clinical Social Work  Discharge planning Services  CM Consult  Post Acute Care Choice:  Home Health Choice offered to:  NA (ALF )  DME Arranged:  N/A DME Agency:  NA  HH Arranged:  PT HH Agency:  [facility] now [facility].  Status of Service:  Completed, signed off  If discussed at [place] of Stay Meetings, dates discussed:    Additional Comments:  Adron Bene, RN 09/11/2016, 2:46 PM

## 2016-09-11 NOTE — Clinical Social Work Note (Signed)
Patient medically stable for discharge back to [facility]. Contact made with Santiago Glad at [facility] and discharge clinicals transmitted to facility. CSW talked with patient and family at the bedside and they will transport patient back to ALF. Family given prescriptions to take to facility.  CSW signing off as patient discharging this afternoon and no further CSW interventions needed.  Dabney Dever Givens, MSW, LCSW Licensed Clinical Social Worker [place] [phone]

## 2016-09-11 NOTE — Progress Notes (Signed)
Physical Therapy Treatment Patient Details Name: [name] [name] MRN: [id number] DOB: [birth year] Today's Date: 09/11/2016    History of Present Illness [name] [name] is a very pleasant 83 y.o. female with medical history significant for manic kidney disease stage IV, seizure disorder, hypertension, frequent UTIs, CAD status post CABG, hyperlipidemia, macular degeneration, bladder cancer, renal cancer status post right nephrectomy presents to the emergency department with the chief complaint of persistent and worsening flank pain. Initial evaluation includes a CT concerning for pyelonephritis analysis consistent with UTI.    PT Comments    Pt presents with improved mobility this session. Pt is able to perform gait with improved upright posture, gait speed and sequencing this session. Pt will benefit from continued PT follow-up at discharge in order to assist with improved independence at ALF. Pt will have assistance at ALF, but has chosen not to accept it thus far. Pt appears mobile enough to function well in an ALF setting according to recent therapy session.    Follow Up Recommendations  Home health PT     Equipment Recommendations  None recommended by PT    Recommendations for Other Services       Precautions / Restrictions Precautions Precautions: Fall Restrictions Weight Bearing Restrictions: No    Mobility  Bed Mobility Overal bed mobility: Needs Assistance Bed Mobility: Supine to Sit     Supine to sit: Min assist     General bed mobility comments: Min a to bring trunk upright at EOB  Transfers Overall transfer level: Needs assistance Equipment used: Rolling walker (2 wheeled) Transfers: Sit to/from Stand Sit to Stand: Min guard         General transfer comment: increased time needed and supervision for safety  Ambulation/Gait Ambulation/Gait assistance: Min guard Ambulation Distance (Feet): 75 Feet Assistive device: Rolling walker (2 wheeled) Gait  Pattern/deviations: Step-through pattern;Decreased stride length;Trunk flexed Gait velocity: decreased Gait velocity interpretation: Below normal speed for age/gender General Gait Details: pt is able to increased gait distance x 75' this session with improved seqeuncing and decreased assistance required.    Stairs            Wheelchair Mobility    Modified Rankin (Stroke Patients Only)       Balance Overall balance assessment: Needs assistance Sitting-balance support: No upper extremity supported Sitting balance-Leahy Scale: Good     Standing balance support: Bilateral upper extremity supported Standing balance-Leahy Scale: Poor Standing balance comment: reliant on RW for stability in standig                            Cognition Arousal/Alertness: Awake/alert Behavior During Therapy: WFL for tasks assessed/performed Overall Cognitive Status: Within Functional Limits for tasks assessed                                        Exercises      General Comments        Pertinent Vitals/Pain Pain Assessment: Faces Faces Pain Scale: Hurts a little bit Pain Location: right side and back Pain Descriptors / Indicators: Aching;Grimacing;Guarding Pain Intervention(s): Premedicated before session;Monitored during session;Repositioned    Home Living                      Prior Function            PT Goals (current goals can  now be found in the care plan section) Acute Rehab PT Goals Patient Stated Goal: to get back to [facility]. [place] PT Goal Formulation: With patient Time For Goal Achievement: 09/24/16 Potential to Achieve Goals: Good Progress towards PT goals: Progressing toward goals    Frequency    Min 3X/week      PT Plan Discharge plan needs to be updated    Co-evaluation              AM-PAC PT "6 Clicks" Daily Activity  Outcome Measure  Difficulty turning over in bed (including adjusting bedclothes, sheets and  blankets)?: None Difficulty moving from lying on back to sitting on the side of the bed? : Total Difficulty sitting down on and standing up from a chair with arms (e.g., wheelchair, bedside commode, etc,.)?: Total Help needed moving to and from a bed to chair (including a wheelchair)?: A Little Help needed walking in hospital room?: A Little Help needed climbing 3-5 steps with a railing? : A Lot 6 Click Score: 14    End of Session Equipment Utilized During Treatment: Gait belt Activity Tolerance: Patient tolerated treatment well Patient left: in chair;with call bell/phone within reach;with chair alarm set Nurse Communication: Mobility status PT Visit Diagnosis: Unsteadiness on feet (R26.81);Muscle weakness (generalized) (M62.81);Pain Pain - Right/Left: Right Pain - part of body:  (back)     Time: 1017-5102 PT Time Calculation (min) (ACUTE ONLY): 17 min  Charges:  $Gait Training: 8-22 mins                    G Codes:       Scheryl Marten PT, DPT  [phone]    Jacqulyn Liner Sloan Leiter 09/11/2016, 1:58 PM

## 2016-09-11 NOTE — Progress Notes (Signed)
[name] [name] to be D/C'd to [facility] per MD order.  Discussed prescriptions and follow up appointments with the patient. Prescriptions given to patient, medication list explained in detail. Pt verbalized understanding.  Allergies as of 09/11/2016      Reactions   Adhesive [tape] Other (See Comments)   Tears patients skin off.    Keflet [cephalexin] Nausea And Vomiting   Latex Itching   Penicillins Hives, Itching   Sulfa Antibiotics Hives, Itching      Medication List    STOP taking these medications   potassium chloride SA 20 MEQ tablet Commonly known as:  K-DUR,KLOR-CON     TAKE these medications   ALPRAZolam 0.5 MG tablet Commonly known as:  XANAX Take 0.5-1 tablets (0.25-0.5 mg total) by mouth 2 (two) times daily as needed for anxiety (or agitation). For nerves   amLODipine 5 MG tablet Commonly known as:  NORVASC Take 2.5 mg by mouth daily.   BELSOMRA 20 MG Tabs Generic drug:  Suvorexant Take 20 mg by mouth at bedtime.   cefpodoxime 200 MG tablet Commonly known as:  VANTIN Take 1 tablet (200 mg total) by mouth daily.   CRESTOR 20 MG tablet Generic drug:  rosuvastatin Take 20 mg by mouth daily.   FOLTANX RF 3-90.314-2-35 MG Caps Take 1 capsule by mouth daily.   gabapentin 300 MG capsule Commonly known as:  NEURONTIN Take 300 mg by mouth 2 (two) times daily.   HYDROcodone-acetaminophen 10-325 MG tablet Commonly known as:  NORCO Take 1 tablet by mouth 4 (four) times daily.   levETIRAcetam 250 MG tablet Commonly known as:  KEPPRA Take 1 tablet (250 mg total) by mouth 2 (two) times daily.   LINZESS 145 MCG Caps capsule Generic drug:  linaclotide Take 145 mcg by mouth at bedtime.   Magnesium 250 MG Tabs Take 250 mg by mouth daily.   MYRBETRIQ 25 MG Tb24 tablet Generic drug:  mirabegron ER Take 25 mg by mouth at bedtime.   nitroGLYCERIN 0.4 MG SL tablet Commonly known as:  NITROSTAT Place 0.4 mg under the tongue every 5 (five)  minutes as needed for chest pain.   polyethylene glycol packet Commonly known as:  MIRALAX / GLYCOLAX Take 17 g by mouth daily as needed for moderate constipation.   PRESERVISION AREDS PO Take 1 capsule by mouth daily.       Vitals:   09/11/16 0442 09/11/16 0955  BP: (!) 135/92 (!) 102/51  Pulse: 63 65  Resp: 16 16  Temp: 97.8 F (36.6 C) 97.7 F (36.5 C)    Skin clean, dry and intact without evidence of skin break down, no evidence of skin tears noted. IV catheter discontinued intact. Site without signs and symptoms of complications. Dressing and pressure applied. Pt denies pain at this time. No complaints noted.  An After Visit Summary was printed and given to the patient. Patient escorted via [place], and D/C to assisted living facility via private auto, taken by sisters.   Emilio Math, RN [facility] Phone [phone]

## 2016-09-11 NOTE — NC FL2 (Signed)
[place] MEDICAID FL2 LEVEL OF CARE SCREENING TOOL     IDENTIFICATION  Patient Name: [name] [name] Birthdate: Nov 10, 1928 Sex: female Admission Date (Current Location): 09/09/2016  [facility] and [place] Number:  Herbalist and Address:  [facility] [place] [address] [place], [place] [address]      Provider Number: [id number]  Attending Physician Name and Address:  Patrecia Pour, MD  Relative Name and Phone Number:  Lavella Hammock - sister; [phone]    Current Level of Care: Hospital Recommended Level of Care: [place] ([place]) Prior Approval Number:    Date Approved/Denied:   PASRR Number:    Discharge Plan: Other (Comment) ([place] )    Current Diagnoses: Patient Active Problem List   Diagnosis Date Noted  . Pyelonephritis 09/09/2016  . Falls frequently 06/24/2016  . Tension headache 11/10/2013  . Chest pain 11/03/2011  . ARF (acute renal failure) ([place]) 04/30/2011  . UTI (urinary tract infection) 04/30/2011  . CKD (chronic kidney disease), stage IV ([place]) 04/30/2011  . Anemia 04/30/2011  . S/p nephrectomy 04/30/2011  . Macular degeneration 04/30/2011  . Cataract 04/30/2011  . Bladder cancer ([place]) 04/30/2011  . Weakness generalized 04/29/2011  . Seizure disorder ([place]) 04/29/2011  . Benign hypertension 04/29/2011  . CAD (coronary artery disease) 04/29/2011  . Hyperlipidemia 04/29/2011  . Anxiety 04/29/2011  . Dizziness - light-headed 04/29/2011  . Chronic abdominal pain 04/29/2011  . Mixed hyperlipidemia 05/24/2008  . Essential hypertension, benign 05/24/2008  . Postsurgical aortocoronary bypass status 05/24/2008    Orientation RESPIRATION BLADDER Height & Weight     Self, Time, Situation, Place  Normal Incontinent Weight: 164 lb (74.4 kg) Height:  5\' 2"  (157.5 cm)  BEHAVIORAL SYMPTOMS/MOOD NEUROLOGICAL BOWEL NUTRITION STATUS    Convulsions/Seizures (History of  seizures) Continent Diet (Heart healthy)  AMBULATORY STATUS COMMUNICATION OF NEEDS Skin   Limited Assist Verbally Normal                       Personal Care Assistance Level of Assistance  Bathing, Feeding, Dressing Bathing Assistance: Limited assistance Feeding assistance: Independent Dressing Assistance: Limited assistance     Functional Limitations Info  Sight, Hearing, Speech Sight Info: Adequate Hearing Info: Adequate Speech Info: Adequate    SPECIAL CARE FACTORS FREQUENCY  PT (By licensed PT)     PT Frequency: Evaluated 6/6 and a minimum of 3X per week therapy recommended              Contractures Contractures Info: Not present    Additional Factors Info  Code Status, Allergies Code Status Info: Full Allergies Info: Adhesive tape, Keflef, Latex, Penicillins, Sulfa Antibiotics           Current Medications (09/11/2016):  This is the current hospital active medication list Current Facility-Administered Medications  Medication Dose Route Frequency Provider Last Rate Last Dose  . acetaminophen (TYLENOL) tablet 650 mg  650 mg Oral Q6H PRN Radene Gunning, NP       Or  . acetaminophen (TYLENOL) suppository 650 mg  650 mg Rectal Q6H PRN Radene Gunning, NP      . ALPRAZolam Duanne Moron) tablet 0.125-0.5 mg  0.125-0.5 mg Oral BID PRN Radene Gunning, NP   0.5 mg at 09/10/16 2137  . amLODipine (NORVASC) tablet 5 mg  5 mg Oral Daily Radene Gunning, NP   5 mg at 09/11/16 0949  . aspirin  EC tablet 81 mg  81 mg Oral QHS Radene Gunning, NP   81 mg at 09/10/16 2138  . cefpodoxime (VANTIN) tablet 200 mg  200 mg Oral Q24H Vance Gather B, MD      . docusate sodium (COLACE) capsule 200 mg  200 mg Oral q morning - 10a Black, Lezlie Octave, NP   200 mg at 09/11/16 0949  . enoxaparin (LOVENOX) injection 30 mg  30 mg Subcutaneous Q24H Radene Gunning, NP   30 mg at 09/11/16 0949  . furosemide (LASIX) tablet 20 mg  20 mg Oral Gweneth Fritter, NP   20 mg at 09/10/16 1034  . gabapentin  (NEURONTIN) capsule 300 mg  300 mg Oral BID Radene Gunning, NP   300 mg at 09/11/16 0949  . HYDROcodone-acetaminophen (NORCO) 10-325 MG per tablet 1 tablet  1 tablet Oral Q4H PRN Radene Gunning, NP   1 tablet at 09/11/16 1111  . l-methylfolate-B6-B12 (METANX) 3-35-2 MG per tablet 1 tablet  1 tablet Oral QHS Radene Gunning, NP   1 tablet at 09/10/16 2138  . levETIRAcetam (KEPPRA) tablet 250 mg  250 mg Oral BID Radene Gunning, NP   250 mg at 09/11/16 0949  . linaclotide (LINZESS) capsule 145 mcg  145 mcg Oral QHS Radene Gunning, NP   145 mcg at 09/10/16 2137  . magnesium oxide (MAG-OX) tablet 200 mg  200 mg Oral QHS Waldemar Dickens, MD   200 mg at 09/10/16 2138  . mirabegron ER (MYRBETRIQ) tablet 25 mg  25 mg Oral QHS Radene Gunning, NP   25 mg at 09/10/16 2137  . psyllium (HYDROCIL/METAMUCIL) packet 1 packet  1 packet Oral Q48H PRN Radene Gunning, NP      . rosuvastatin (CRESTOR) tablet 20 mg  20 mg Oral QHS Radene Gunning, NP   20 mg at 09/10/16 2139  . zolpidem (AMBIEN) tablet 5 mg  5 mg Oral QHS PRN Waldemar Dickens, MD         Discharge Medications: Please see discharge summary for a list of discharge medications.  Relevant Imaging Results:  Relevant Lab Results:   Additional Information [phone]. DISCHARGE MEDICATIONS BELOW:  STOP taking these medications   potassium chloride SA 20 MEQ tablet Commonly known as:  K-DUR,KLOR-CON     TAKE these medications   ALPRAZolam 0.5 MG tablet Commonly known as:  XANAX Take 0.5-1 tablets (0.25-0.5 mg total) by mouth 2 (two) times daily as needed for anxiety (or agitation). For nerves   amLODipine 5 MG tablet Commonly known as:  NORVASC Take 2.5 mg by mouth daily.   BELSOMRA 20 MG Tabs Generic drug:  Suvorexant Take 20 mg by mouth at bedtime.   cefpodoxime 200 MG tablet Commonly known as:  VANTIN Take 1 tablet (200 mg total) by mouth daily.   CRESTOR 20 MG tablet Generic drug:  rosuvastatin Take 20 mg by mouth  daily.   FOLTANX RF 3-90.314-2-35 MG Caps Take 1 capsule by mouth daily.   gabapentin 300 MG capsule Commonly known as:  NEURONTIN Take 300 mg by mouth 2 (two) times daily.   HYDROcodone-acetaminophen 10-325 MG tablet Commonly known as:  NORCO Take 1 tablet by mouth 4 (four) times daily.   levETIRAcetam 250 MG tablet Commonly known as:  KEPPRA Take 1 tablet (250 mg total) by mouth 2 (two) times daily.   LINZESS 145 MCG Caps capsule Generic drug:  linaclotide Take 145 mcg by mouth  at bedtime.   Magnesium 250 MG Tabs Take 250 mg by mouth daily.   MYRBETRIQ 25 MG Tb24 tablet Generic drug:  mirabegron ER Take 25 mg by mouth at bedtime.   nitroGLYCERIN 0.4 MG SL tablet Commonly known as:  NITROSTAT Place 0.4 mg under the tongue every 5 (five) minutes as needed for chest pain.   polyethylene glycol packet Commonly known as:  MIRALAX / GLYCOLAX Take 17 g by mouth daily as needed for moderate constipation.   PRESERVISION AREDS PO Take 1 capsule by mouth daily.        Sable Feil, LCSW

## 2016-09-13 DIAGNOSIS — Z951 Presence of aortocoronary bypass graft: Secondary | ICD-10-CM | POA: Diagnosis not present

## 2016-09-13 DIAGNOSIS — D631 Anemia in chronic kidney disease: Secondary | ICD-10-CM | POA: Diagnosis not present

## 2016-09-13 DIAGNOSIS — N184 Chronic kidney disease, stage 4 (severe): Secondary | ICD-10-CM | POA: Diagnosis not present

## 2016-09-13 DIAGNOSIS — H3530 Unspecified macular degeneration: Secondary | ICD-10-CM | POA: Diagnosis not present

## 2016-09-13 DIAGNOSIS — Z7982 Long term (current) use of aspirin: Secondary | ICD-10-CM | POA: Diagnosis not present

## 2016-09-13 DIAGNOSIS — Z905 Acquired absence of kidney: Secondary | ICD-10-CM | POA: Diagnosis not present

## 2016-09-13 DIAGNOSIS — Z9181 History of falling: Secondary | ICD-10-CM | POA: Diagnosis not present

## 2016-09-13 DIAGNOSIS — I2510 Atherosclerotic heart disease of native coronary artery without angina pectoris: Secondary | ICD-10-CM | POA: Diagnosis not present

## 2016-09-13 DIAGNOSIS — R296 Repeated falls: Secondary | ICD-10-CM | POA: Diagnosis not present

## 2016-09-13 DIAGNOSIS — Z79891 Long term (current) use of opiate analgesic: Secondary | ICD-10-CM | POA: Diagnosis not present

## 2016-09-13 DIAGNOSIS — I129 Hypertensive chronic kidney disease with stage 1 through stage 4 chronic kidney disease, or unspecified chronic kidney disease: Secondary | ICD-10-CM | POA: Diagnosis not present

## 2016-09-13 DIAGNOSIS — M549 Dorsalgia, unspecified: Secondary | ICD-10-CM | POA: Diagnosis not present

## 2016-09-15 DIAGNOSIS — N390 Urinary tract infection, site not specified: Secondary | ICD-10-CM | POA: Diagnosis not present

## 2016-09-15 DIAGNOSIS — E785 Hyperlipidemia, unspecified: Secondary | ICD-10-CM | POA: Diagnosis not present

## 2016-09-15 DIAGNOSIS — I150 Renovascular hypertension: Secondary | ICD-10-CM | POA: Diagnosis not present

## 2016-09-15 DIAGNOSIS — N183 Chronic kidney disease, stage 3 (moderate): Secondary | ICD-10-CM | POA: Diagnosis not present

## 2016-09-17 DIAGNOSIS — Z79891 Long term (current) use of opiate analgesic: Secondary | ICD-10-CM | POA: Diagnosis not present

## 2016-09-17 DIAGNOSIS — R296 Repeated falls: Secondary | ICD-10-CM | POA: Diagnosis not present

## 2016-09-17 DIAGNOSIS — H3530 Unspecified macular degeneration: Secondary | ICD-10-CM | POA: Diagnosis not present

## 2016-09-17 DIAGNOSIS — Z7982 Long term (current) use of aspirin: Secondary | ICD-10-CM | POA: Diagnosis not present

## 2016-09-17 DIAGNOSIS — Z9181 History of falling: Secondary | ICD-10-CM | POA: Diagnosis not present

## 2016-09-17 DIAGNOSIS — D631 Anemia in chronic kidney disease: Secondary | ICD-10-CM | POA: Diagnosis not present

## 2016-09-17 DIAGNOSIS — I2510 Atherosclerotic heart disease of native coronary artery without angina pectoris: Secondary | ICD-10-CM | POA: Diagnosis not present

## 2016-09-17 DIAGNOSIS — I129 Hypertensive chronic kidney disease with stage 1 through stage 4 chronic kidney disease, or unspecified chronic kidney disease: Secondary | ICD-10-CM | POA: Diagnosis not present

## 2016-09-17 DIAGNOSIS — Z905 Acquired absence of kidney: Secondary | ICD-10-CM | POA: Diagnosis not present

## 2016-09-17 DIAGNOSIS — M549 Dorsalgia, unspecified: Secondary | ICD-10-CM | POA: Diagnosis not present

## 2016-09-17 DIAGNOSIS — N184 Chronic kidney disease, stage 4 (severe): Secondary | ICD-10-CM | POA: Diagnosis not present

## 2016-09-17 DIAGNOSIS — Z951 Presence of aortocoronary bypass graft: Secondary | ICD-10-CM | POA: Diagnosis not present

## 2016-09-19 DIAGNOSIS — I129 Hypertensive chronic kidney disease with stage 1 through stage 4 chronic kidney disease, or unspecified chronic kidney disease: Secondary | ICD-10-CM | POA: Diagnosis not present

## 2016-09-19 DIAGNOSIS — Z951 Presence of aortocoronary bypass graft: Secondary | ICD-10-CM | POA: Diagnosis not present

## 2016-09-19 DIAGNOSIS — I2510 Atherosclerotic heart disease of native coronary artery without angina pectoris: Secondary | ICD-10-CM | POA: Diagnosis not present

## 2016-09-19 DIAGNOSIS — N184 Chronic kidney disease, stage 4 (severe): Secondary | ICD-10-CM | POA: Diagnosis not present

## 2016-09-19 DIAGNOSIS — H3530 Unspecified macular degeneration: Secondary | ICD-10-CM | POA: Diagnosis not present

## 2016-09-19 DIAGNOSIS — Z9181 History of falling: Secondary | ICD-10-CM | POA: Diagnosis not present

## 2016-09-19 DIAGNOSIS — R296 Repeated falls: Secondary | ICD-10-CM | POA: Diagnosis not present

## 2016-09-19 DIAGNOSIS — Z79891 Long term (current) use of opiate analgesic: Secondary | ICD-10-CM | POA: Diagnosis not present

## 2016-09-19 DIAGNOSIS — Z905 Acquired absence of kidney: Secondary | ICD-10-CM | POA: Diagnosis not present

## 2016-09-19 DIAGNOSIS — D631 Anemia in chronic kidney disease: Secondary | ICD-10-CM | POA: Diagnosis not present

## 2016-09-19 DIAGNOSIS — M549 Dorsalgia, unspecified: Secondary | ICD-10-CM | POA: Diagnosis not present

## 2016-09-19 DIAGNOSIS — Z7982 Long term (current) use of aspirin: Secondary | ICD-10-CM | POA: Diagnosis not present

## 2016-09-22 DIAGNOSIS — N184 Chronic kidney disease, stage 4 (severe): Secondary | ICD-10-CM | POA: Diagnosis not present

## 2016-09-22 DIAGNOSIS — R740 Nonspecific elevation of levels of transaminase and lactic acid dehydrogenase [LDH]: Secondary | ICD-10-CM | POA: Diagnosis not present

## 2016-09-22 DIAGNOSIS — H3530 Unspecified macular degeneration: Secondary | ICD-10-CM | POA: Diagnosis not present

## 2016-09-22 DIAGNOSIS — D631 Anemia in chronic kidney disease: Secondary | ICD-10-CM | POA: Diagnosis not present

## 2016-09-22 DIAGNOSIS — N3000 Acute cystitis without hematuria: Secondary | ICD-10-CM | POA: Diagnosis not present

## 2016-09-22 DIAGNOSIS — M549 Dorsalgia, unspecified: Secondary | ICD-10-CM | POA: Diagnosis not present

## 2016-09-22 DIAGNOSIS — Z7982 Long term (current) use of aspirin: Secondary | ICD-10-CM | POA: Diagnosis not present

## 2016-09-22 DIAGNOSIS — R296 Repeated falls: Secondary | ICD-10-CM | POA: Diagnosis not present

## 2016-09-22 DIAGNOSIS — Z951 Presence of aortocoronary bypass graft: Secondary | ICD-10-CM | POA: Diagnosis not present

## 2016-09-22 DIAGNOSIS — I150 Renovascular hypertension: Secondary | ICD-10-CM | POA: Diagnosis not present

## 2016-09-22 DIAGNOSIS — I129 Hypertensive chronic kidney disease with stage 1 through stage 4 chronic kidney disease, or unspecified chronic kidney disease: Secondary | ICD-10-CM | POA: Diagnosis not present

## 2016-09-22 DIAGNOSIS — Z9181 History of falling: Secondary | ICD-10-CM | POA: Diagnosis not present

## 2016-09-22 DIAGNOSIS — Z79891 Long term (current) use of opiate analgesic: Secondary | ICD-10-CM | POA: Diagnosis not present

## 2016-09-22 DIAGNOSIS — Z905 Acquired absence of kidney: Secondary | ICD-10-CM | POA: Diagnosis not present

## 2016-09-22 DIAGNOSIS — Z79899 Other long term (current) drug therapy: Secondary | ICD-10-CM | POA: Diagnosis not present

## 2016-09-22 DIAGNOSIS — N183 Chronic kidney disease, stage 3 (moderate): Secondary | ICD-10-CM | POA: Diagnosis not present

## 2016-09-22 DIAGNOSIS — I2510 Atherosclerotic heart disease of native coronary artery without angina pectoris: Secondary | ICD-10-CM | POA: Diagnosis not present

## 2016-09-23 DIAGNOSIS — M6281 Muscle weakness (generalized): Secondary | ICD-10-CM | POA: Diagnosis not present

## 2016-09-23 DIAGNOSIS — Z79899 Other long term (current) drug therapy: Secondary | ICD-10-CM | POA: Diagnosis not present

## 2016-09-23 DIAGNOSIS — I150 Renovascular hypertension: Secondary | ICD-10-CM | POA: Diagnosis not present

## 2016-09-23 DIAGNOSIS — N183 Chronic kidney disease, stage 3 (moderate): Secondary | ICD-10-CM | POA: Diagnosis not present

## 2016-09-29 DIAGNOSIS — Z7982 Long term (current) use of aspirin: Secondary | ICD-10-CM | POA: Diagnosis not present

## 2016-09-29 DIAGNOSIS — R296 Repeated falls: Secondary | ICD-10-CM | POA: Diagnosis not present

## 2016-09-29 DIAGNOSIS — Z905 Acquired absence of kidney: Secondary | ICD-10-CM | POA: Diagnosis not present

## 2016-09-29 DIAGNOSIS — Z951 Presence of aortocoronary bypass graft: Secondary | ICD-10-CM | POA: Diagnosis not present

## 2016-09-29 DIAGNOSIS — D631 Anemia in chronic kidney disease: Secondary | ICD-10-CM | POA: Diagnosis not present

## 2016-09-29 DIAGNOSIS — Z9181 History of falling: Secondary | ICD-10-CM | POA: Diagnosis not present

## 2016-09-29 DIAGNOSIS — I2510 Atherosclerotic heart disease of native coronary artery without angina pectoris: Secondary | ICD-10-CM | POA: Diagnosis not present

## 2016-09-29 DIAGNOSIS — N184 Chronic kidney disease, stage 4 (severe): Secondary | ICD-10-CM | POA: Diagnosis not present

## 2016-09-29 DIAGNOSIS — H3530 Unspecified macular degeneration: Secondary | ICD-10-CM | POA: Diagnosis not present

## 2016-09-29 DIAGNOSIS — I129 Hypertensive chronic kidney disease with stage 1 through stage 4 chronic kidney disease, or unspecified chronic kidney disease: Secondary | ICD-10-CM | POA: Diagnosis not present

## 2016-09-29 DIAGNOSIS — Z79899 Other long term (current) drug therapy: Secondary | ICD-10-CM | POA: Diagnosis not present

## 2016-09-29 DIAGNOSIS — M549 Dorsalgia, unspecified: Secondary | ICD-10-CM | POA: Diagnosis not present

## 2016-09-29 DIAGNOSIS — Z79891 Long term (current) use of opiate analgesic: Secondary | ICD-10-CM | POA: Diagnosis not present

## 2016-10-03 DIAGNOSIS — Z905 Acquired absence of kidney: Secondary | ICD-10-CM | POA: Diagnosis not present

## 2016-10-03 DIAGNOSIS — D631 Anemia in chronic kidney disease: Secondary | ICD-10-CM | POA: Diagnosis not present

## 2016-10-03 DIAGNOSIS — I129 Hypertensive chronic kidney disease with stage 1 through stage 4 chronic kidney disease, or unspecified chronic kidney disease: Secondary | ICD-10-CM | POA: Diagnosis not present

## 2016-10-03 DIAGNOSIS — H3530 Unspecified macular degeneration: Secondary | ICD-10-CM | POA: Diagnosis not present

## 2016-10-03 DIAGNOSIS — Z951 Presence of aortocoronary bypass graft: Secondary | ICD-10-CM | POA: Diagnosis not present

## 2016-10-03 DIAGNOSIS — I2510 Atherosclerotic heart disease of native coronary artery without angina pectoris: Secondary | ICD-10-CM | POA: Diagnosis not present

## 2016-10-03 DIAGNOSIS — Z9181 History of falling: Secondary | ICD-10-CM | POA: Diagnosis not present

## 2016-10-03 DIAGNOSIS — M549 Dorsalgia, unspecified: Secondary | ICD-10-CM | POA: Diagnosis not present

## 2016-10-03 DIAGNOSIS — N184 Chronic kidney disease, stage 4 (severe): Secondary | ICD-10-CM | POA: Diagnosis not present

## 2016-10-03 DIAGNOSIS — Z79891 Long term (current) use of opiate analgesic: Secondary | ICD-10-CM | POA: Diagnosis not present

## 2016-10-03 DIAGNOSIS — Z7982 Long term (current) use of aspirin: Secondary | ICD-10-CM | POA: Diagnosis not present

## 2016-10-03 DIAGNOSIS — R296 Repeated falls: Secondary | ICD-10-CM | POA: Diagnosis not present

## 2016-10-06 DIAGNOSIS — E875 Hyperkalemia: Secondary | ICD-10-CM | POA: Diagnosis not present

## 2016-10-06 DIAGNOSIS — I2510 Atherosclerotic heart disease of native coronary artery without angina pectoris: Secondary | ICD-10-CM | POA: Diagnosis not present

## 2016-10-06 DIAGNOSIS — N183 Chronic kidney disease, stage 3 (moderate): Secondary | ICD-10-CM | POA: Diagnosis not present

## 2016-10-06 DIAGNOSIS — Z79891 Long term (current) use of opiate analgesic: Secondary | ICD-10-CM | POA: Diagnosis not present

## 2016-10-06 DIAGNOSIS — Z951 Presence of aortocoronary bypass graft: Secondary | ICD-10-CM | POA: Diagnosis not present

## 2016-10-06 DIAGNOSIS — Z905 Acquired absence of kidney: Secondary | ICD-10-CM | POA: Diagnosis not present

## 2016-10-06 DIAGNOSIS — Z9181 History of falling: Secondary | ICD-10-CM | POA: Diagnosis not present

## 2016-10-06 DIAGNOSIS — E039 Hypothyroidism, unspecified: Secondary | ICD-10-CM | POA: Diagnosis not present

## 2016-10-06 DIAGNOSIS — Z7982 Long term (current) use of aspirin: Secondary | ICD-10-CM | POA: Diagnosis not present

## 2016-10-06 DIAGNOSIS — M549 Dorsalgia, unspecified: Secondary | ICD-10-CM | POA: Diagnosis not present

## 2016-10-06 DIAGNOSIS — R300 Dysuria: Secondary | ICD-10-CM | POA: Diagnosis not present

## 2016-10-06 DIAGNOSIS — D631 Anemia in chronic kidney disease: Secondary | ICD-10-CM | POA: Diagnosis not present

## 2016-10-06 DIAGNOSIS — H3530 Unspecified macular degeneration: Secondary | ICD-10-CM | POA: Diagnosis not present

## 2016-10-06 DIAGNOSIS — N184 Chronic kidney disease, stage 4 (severe): Secondary | ICD-10-CM | POA: Diagnosis not present

## 2016-10-06 DIAGNOSIS — R296 Repeated falls: Secondary | ICD-10-CM | POA: Diagnosis not present

## 2016-10-06 DIAGNOSIS — I129 Hypertensive chronic kidney disease with stage 1 through stage 4 chronic kidney disease, or unspecified chronic kidney disease: Secondary | ICD-10-CM | POA: Diagnosis not present

## 2016-10-09 DIAGNOSIS — R296 Repeated falls: Secondary | ICD-10-CM | POA: Diagnosis not present

## 2016-10-09 DIAGNOSIS — D631 Anemia in chronic kidney disease: Secondary | ICD-10-CM | POA: Diagnosis not present

## 2016-10-09 DIAGNOSIS — Z905 Acquired absence of kidney: Secondary | ICD-10-CM | POA: Diagnosis not present

## 2016-10-09 DIAGNOSIS — H3530 Unspecified macular degeneration: Secondary | ICD-10-CM | POA: Diagnosis not present

## 2016-10-09 DIAGNOSIS — N184 Chronic kidney disease, stage 4 (severe): Secondary | ICD-10-CM | POA: Diagnosis not present

## 2016-10-09 DIAGNOSIS — I129 Hypertensive chronic kidney disease with stage 1 through stage 4 chronic kidney disease, or unspecified chronic kidney disease: Secondary | ICD-10-CM | POA: Diagnosis not present

## 2016-10-09 DIAGNOSIS — Z9181 History of falling: Secondary | ICD-10-CM | POA: Diagnosis not present

## 2016-10-09 DIAGNOSIS — Z7982 Long term (current) use of aspirin: Secondary | ICD-10-CM | POA: Diagnosis not present

## 2016-10-09 DIAGNOSIS — M549 Dorsalgia, unspecified: Secondary | ICD-10-CM | POA: Diagnosis not present

## 2016-10-09 DIAGNOSIS — Z951 Presence of aortocoronary bypass graft: Secondary | ICD-10-CM | POA: Diagnosis not present

## 2016-10-09 DIAGNOSIS — Z79891 Long term (current) use of opiate analgesic: Secondary | ICD-10-CM | POA: Diagnosis not present

## 2016-10-09 DIAGNOSIS — I2510 Atherosclerotic heart disease of native coronary artery without angina pectoris: Secondary | ICD-10-CM | POA: Diagnosis not present

## 2016-10-13 DIAGNOSIS — D631 Anemia in chronic kidney disease: Secondary | ICD-10-CM | POA: Diagnosis not present

## 2016-10-13 DIAGNOSIS — Z9181 History of falling: Secondary | ICD-10-CM | POA: Diagnosis not present

## 2016-10-13 DIAGNOSIS — I129 Hypertensive chronic kidney disease with stage 1 through stage 4 chronic kidney disease, or unspecified chronic kidney disease: Secondary | ICD-10-CM | POA: Diagnosis not present

## 2016-10-13 DIAGNOSIS — Z951 Presence of aortocoronary bypass graft: Secondary | ICD-10-CM | POA: Diagnosis not present

## 2016-10-13 DIAGNOSIS — R296 Repeated falls: Secondary | ICD-10-CM | POA: Diagnosis not present

## 2016-10-13 DIAGNOSIS — M549 Dorsalgia, unspecified: Secondary | ICD-10-CM | POA: Diagnosis not present

## 2016-10-13 DIAGNOSIS — Z905 Acquired absence of kidney: Secondary | ICD-10-CM | POA: Diagnosis not present

## 2016-10-13 DIAGNOSIS — H3530 Unspecified macular degeneration: Secondary | ICD-10-CM | POA: Diagnosis not present

## 2016-10-13 DIAGNOSIS — I2510 Atherosclerotic heart disease of native coronary artery without angina pectoris: Secondary | ICD-10-CM | POA: Diagnosis not present

## 2016-10-13 DIAGNOSIS — Z7982 Long term (current) use of aspirin: Secondary | ICD-10-CM | POA: Diagnosis not present

## 2016-10-13 DIAGNOSIS — Z79891 Long term (current) use of opiate analgesic: Secondary | ICD-10-CM | POA: Diagnosis not present

## 2016-10-13 DIAGNOSIS — N184 Chronic kidney disease, stage 4 (severe): Secondary | ICD-10-CM | POA: Diagnosis not present

## 2016-10-20 DIAGNOSIS — G8929 Other chronic pain: Secondary | ICD-10-CM | POA: Diagnosis not present

## 2016-10-20 DIAGNOSIS — I150 Renovascular hypertension: Secondary | ICD-10-CM | POA: Diagnosis not present

## 2016-10-20 DIAGNOSIS — E559 Vitamin D deficiency, unspecified: Secondary | ICD-10-CM | POA: Diagnosis not present

## 2016-10-20 DIAGNOSIS — G4709 Other insomnia: Secondary | ICD-10-CM | POA: Diagnosis not present

## 2016-10-20 DIAGNOSIS — R195 Other fecal abnormalities: Secondary | ICD-10-CM | POA: Diagnosis not present

## 2016-10-23 DIAGNOSIS — N390 Urinary tract infection, site not specified: Secondary | ICD-10-CM | POA: Diagnosis not present

## 2016-11-01 DIAGNOSIS — E782 Mixed hyperlipidemia: Secondary | ICD-10-CM | POA: Diagnosis not present

## 2016-11-01 DIAGNOSIS — I10 Essential (primary) hypertension: Secondary | ICD-10-CM | POA: Diagnosis not present

## 2016-11-01 DIAGNOSIS — N183 Chronic kidney disease, stage 3 (moderate): Secondary | ICD-10-CM | POA: Diagnosis not present

## 2016-11-01 DIAGNOSIS — E039 Hypothyroidism, unspecified: Secondary | ICD-10-CM | POA: Diagnosis not present

## 2016-11-17 DIAGNOSIS — I10 Essential (primary) hypertension: Secondary | ICD-10-CM | POA: Diagnosis not present

## 2016-11-20 DIAGNOSIS — N183 Chronic kidney disease, stage 3 (moderate): Secondary | ICD-10-CM | POA: Diagnosis not present

## 2016-11-20 DIAGNOSIS — I10 Essential (primary) hypertension: Secondary | ICD-10-CM | POA: Diagnosis not present

## 2016-11-20 DIAGNOSIS — G894 Chronic pain syndrome: Secondary | ICD-10-CM | POA: Diagnosis not present

## 2016-11-20 DIAGNOSIS — R252 Cramp and spasm: Secondary | ICD-10-CM | POA: Diagnosis not present

## 2016-12-04 ENCOUNTER — Encounter (HOSPITAL_COMMUNITY): Payer: Self-pay

## 2016-12-04 ENCOUNTER — Emergency Department (HOSPITAL_COMMUNITY)
Admission: EM | Admit: 2016-12-04 | Discharge: 2016-12-04 | Disposition: A | Discharge status: Home / Self Care | Payer: Medicare Other | Attending: Emergency Medicine | Admitting: Emergency Medicine

## 2016-12-04 DIAGNOSIS — N184 Chronic kidney disease, stage 4 (severe): Secondary | ICD-10-CM | POA: Insufficient documentation

## 2016-12-04 DIAGNOSIS — R9431 Abnormal electrocardiogram [ECG] [EKG]: Secondary | ICD-10-CM | POA: Diagnosis not present

## 2016-12-04 DIAGNOSIS — Z79899 Other long term (current) drug therapy: Secondary | ICD-10-CM | POA: Insufficient documentation

## 2016-12-04 DIAGNOSIS — I2510 Atherosclerotic heart disease of native coronary artery without angina pectoris: Secondary | ICD-10-CM | POA: Insufficient documentation

## 2016-12-04 DIAGNOSIS — R531 Weakness: Secondary | ICD-10-CM | POA: Insufficient documentation

## 2016-12-04 DIAGNOSIS — S79911A Unspecified injury of right hip, initial encounter: Secondary | ICD-10-CM | POA: Diagnosis not present

## 2016-12-04 DIAGNOSIS — N390 Urinary tract infection, site not specified: Secondary | ICD-10-CM

## 2016-12-04 DIAGNOSIS — R42 Dizziness and giddiness: Secondary | ICD-10-CM | POA: Diagnosis present

## 2016-12-04 DIAGNOSIS — I129 Hypertensive chronic kidney disease with stage 1 through stage 4 chronic kidney disease, or unspecified chronic kidney disease: Secondary | ICD-10-CM | POA: Insufficient documentation

## 2016-12-04 DIAGNOSIS — G8911 Acute pain due to trauma: Secondary | ICD-10-CM | POA: Diagnosis not present

## 2016-12-04 DIAGNOSIS — Z91040 Latex allergy status: Secondary | ICD-10-CM | POA: Insufficient documentation

## 2016-12-04 LAB — BASIC METABOLIC PANEL
Anion gap: 8 (ref 5–15)
BUN: 19 mg/dL (ref 6–20)
CALCIUM: 8.8 mg/dL — AB (ref 8.9–10.3)
CHLORIDE: 104 mmol/L (ref 101–111)
CO2: 26 mmol/L (ref 22–32)
CREATININE: 1.9 mg/dL — AB (ref 0.44–1.00)
GFR calc non Af Amer: 23 mL/min — ABNORMAL LOW (ref >60)
GFR, EST AFRICAN AMERICAN: 26 mL/min — AB (ref >60)
Glucose, Bld: 105 mg/dL — ABNORMAL HIGH (ref 65–99)
Potassium: 4.2 mmol/L (ref 3.5–5.1)
SODIUM: 138 mmol/L (ref 135–145)

## 2016-12-04 LAB — CBC
HEMATOCRIT: 35.8 % — AB (ref 36.0–46.0)
Hemoglobin: 11.2 g/dL — ABNORMAL LOW (ref 12.0–15.0)
MCH: 28.2 pg (ref 26.0–34.0)
MCHC: 31.3 g/dL (ref 30.0–36.0)
MCV: 90.2 fL (ref 78.0–100.0)
Platelets: 141 10*3/uL — ABNORMAL LOW (ref 150–400)
RBC: 3.97 MIL/uL (ref 3.87–5.11)
RDW: 15.7 % — AB (ref 11.5–15.5)
WBC: 9.2 10*3/uL (ref 4.0–10.5)

## 2016-12-04 LAB — URINALYSIS, ROUTINE W REFLEX MICROSCOPIC
Bilirubin Urine: NEGATIVE
GLUCOSE, UA: NEGATIVE mg/dL
KETONES UR: NEGATIVE mg/dL
Nitrite: NEGATIVE
PH: 7 (ref 5.0–8.0)
PROTEIN: 30 mg/dL — AB
Specific Gravity, Urine: <1.005 — ABNORMAL LOW (ref 1.005–1.030)

## 2016-12-04 LAB — URINALYSIS, MICROSCOPIC (REFLEX)

## 2016-12-04 MED ORDER — FOSFOMYCIN TROMETHAMINE 3 G PO PACK: PACK | ORAL | 1 refills | Status: DC

## 2016-12-04 MED ORDER — FOSFOMYCIN TROMETHAMINE 3 G PO PACK
3.0000 g | PACK | Freq: Once | ORAL | Status: AC
  Administered 2016-12-04: 3 g via ORAL
  Filled 2016-12-04: qty 3

## 2016-12-04 MED ORDER — SODIUM CHLORIDE 0.9 % IV BOLUS (SEPSIS)
500.0000 mL | Freq: Once | INTRAVENOUS | Status: AC
  Administered 2016-12-04: 500 mL via INTRAVENOUS

## 2016-12-04 NOTE — ED Triage Notes (Signed)
Pt arrives EMS from NH with c/o fall x 3 today with pain at bilateral shoulders. Pt states she was dizzy prior to fall. Alert and oriented x 3.

## 2016-12-04 NOTE — ED Notes (Signed)
Pt moved to pod F for privacy for bedpan.

## 2016-12-04 NOTE — ED Provider Notes (Signed)
[facility] DEPT Provider Note   CSN: [id number] Arrival date & time: 12/04/16  1217     History   Chief Complaint Chief Complaint  Patient presents with  . Fall  . Weakness    HPI [name] [name] is a 83 y.o. female presenting with 3 days of dizziness, frequent falls, and weakness.  Patient states that the past 2 days, she's had persistent dizziness. She describes this as the room is spinning. She states this is why she's fallen several times. She has not hit her head on any fall. She denies blood thinners. She denies loss of consciousness. She reports that last time she fell landing on her right side, and has some aches in her right shoulder. She denies headache, neck pain, or back pain.  She reports increased frequency of urination and dysuria over the past several days. She denies blood in her urine. She denies fever, chills, chest pain, shortness of breath, nausea, vomiting, abdominal pain, or abnormal bowel movements. She denies numbness or tingling. She states that she's had dizziness like this before, but she does not remember the cause. She was admitted to the hospital earlier this summer (09/2016) for pyelonephritis. Past medical history significant for dizziness, bladder cancer, chronic kidney disease, and unilateral nephrectomy (2000).  HPI  Past Medical History:  Diagnosis Date  . Back pain   . Bladder cancer ([place])   . CAD (coronary artery disease)   . CKD (chronic kidney disease), stage IV ([place])   . Dizziness   . 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 Essential hypertension, benign   . Mixed hyperlipidemia   . Obesity   . Postsurgical aortocoronary bypass status   . Seizure [facility])     Patient Active Problem List   Diagnosis Date Noted  . Pyelonephritis 09/09/2016  . Falls frequently 06/24/2016  . Tension headache 11/10/2013  . Chest pain 11/03/2011  . ARF (acute renal failure) ([place]) 04/30/2011  . UTI (urinary tract infection) 04/30/2011  . CKD (chronic kidney  disease), stage IV ([place]) 04/30/2011  . Anemia 04/30/2011  . S/p nephrectomy 04/30/2011  . Macular degeneration 04/30/2011  . Cataract 04/30/2011  . Bladder cancer ([place]) 04/30/2011  . Weakness generalized 04/29/2011  . Seizure disorder ([place]) 04/29/2011  . Benign hypertension 04/29/2011  . CAD (coronary artery disease) 04/29/2011  . Hyperlipidemia 04/29/2011  . Anxiety 04/29/2011  . Dizziness - light-headed 04/29/2011  . Chronic abdominal pain 04/29/2011  . Mixed hyperlipidemia 05/24/2008  . Essential hypertension, benign 05/24/2008  . Postsurgical aortocoronary bypass status 05/24/2008    Past Surgical History:  Procedure Laterality Date  . CATARACT EXTRACTION    . CORONARY ARTERY BYPASS GRAFT    . IRIDECTOMY    . NEPHRECTOMY  2000    OB History    Gravida Para Term Preterm AB Living   3 3 3     3    SAB TAB Ectopic Multiple Live Births                   Home Medications    Prior to Admission medications   Medication Sig Start Date End Date Taking? Authorizing Provider  ALPRAZolam Duanne Moron) 0.5 MG tablet Take 0.5-1 tablets (0.25-0.5 mg total) by mouth 2 (two) times daily as needed for anxiety (or agitation). For nerves 09/11/16   Patrecia Pour, MD  amLODipine (NORVASC) 5 MG tablet Take 2.5 mg by mouth daily.     [provider]  BELSOMRA 20 MG TABS Take 20 mg  by mouth at bedtime.  12/15/14   [provider]  cefpodoxime (VANTIN) 200 MG tablet Take 1 tablet (200 mg total) by mouth daily. 09/11/16   Patrecia Pour, MD  CRESTOR 20 MG tablet Take 20 mg by mouth daily.  01/06/11   [provider]  fosfomycin (MONUROL) 3 g PACK Take on dose today and one dose in 2 days 12/04/16   Makeshia Seat, PA-C  gabapentin (NEURONTIN) 300 MG capsule Take 300 mg by mouth 2 (two) times daily.  11/20/15   [provider]  HYDROcodone-acetaminophen (NORCO) 10-325 MG tablet Take 1 tablet by mouth 4 (four) times daily. 09/11/16   Patrecia Pour, MD    L-Methylfolate-Algae-B12-B6 Lebron Quam RF) 3-90.314-2-35 MG CAPS Take 1 capsule by mouth daily.    [provider]  levETIRAcetam (KEPPRA) 250 MG tablet Take 1 tablet (250 mg total) by mouth 2 (two) times daily. 12/20/15   Garvin Fila, MD  LINZESS 145 MCG CAPS capsule Take 145 mcg by mouth at bedtime.  12/18/14   [provider]  Magnesium 250 MG TABS Take 250 mg by mouth daily.     [provider]  Multiple Vitamins-Minerals (PRESERVISION AREDS PO) Take 1 capsule by mouth daily.    [provider]  MYRBETRIQ 25 MG TB24 tablet Take 25 mg by mouth at bedtime.  11/28/14   [provider]  nitroGLYCERIN (NITROSTAT) 0.4 MG SL tablet Place 0.4 mg under the tongue every 5 (five) minutes as needed for chest pain.    [provider]  polyethylene glycol (MIRALAX / GLYCOLAX) packet Take 17 g by mouth daily as needed for moderate constipation.    [provider]    Family History Family History  Problem Relation Age of Onset  . Colon cancer Mother   . Heart attack Father   . Diabetes Father   . Stroke Unknown   . Coronary artery disease Unknown   . Cancer Unknown   . Diabetes Unknown     Social History Social History  Substance Use Topics  . Smoking status: Never Smoker  . Smokeless tobacco: Never Used  . Alcohol use No     Allergies   Adhesive [tape]; Keflet [cephalexin]; Latex; Penicillins; and Sulfa antibiotics   Review of Systems Review of Systems  Constitutional: Negative for chills and fever.  HENT: Negative for congestion and sore throat.   Eyes: Negative for photophobia and visual disturbance.  Respiratory: Negative for cough, chest tightness and shortness of breath.   Cardiovascular: Negative for chest pain.  Gastrointestinal: Negative for abdominal pain, constipation, diarrhea, nausea and vomiting.  Genitourinary: Positive for dysuria and frequency. Negative for flank pain and hematuria.  Musculoskeletal:  Positive for arthralgias.  Skin: Negative for wound.  Neurological: Positive for dizziness. Negative for light-headedness and headaches.  Hematological: Does not bruise/bleed easily.     Physical Exam Updated Vital Signs BP (!) 137/49   Pulse 67   Temp 98.5 F (36.9 C) (Oral)   Resp 16   SpO2 97%   Physical Exam  Constitutional: She is oriented to person, place, and time. She appears well-developed and well-nourished. No distress.  HENT:  Head: Normocephalic and atraumatic.  Eyes: Pupils are equal, round, and reactive to light. EOM are normal.  Neck: Normal range of motion.  Full range of motion of the neck without pain. No midline cervical spine tenderness  Cardiovascular: Normal rate, regular rhythm and intact distal pulses.   Pulmonary/Chest: Effort normal and breath sounds  normal. No respiratory distress. She has no wheezes. She exhibits no tenderness.  Abdominal: Soft. Bowel sounds are normal. She exhibits no distension. There is tenderness in the suprapubic area. There is no rigidity, no rebound, no guarding and no CVA tenderness.  Musculoskeletal: Normal range of motion.  Minimal tenderness palpation of right shoulder. No pain along before meals joint line. No obvious lacerations, contusions, or injury. Full active range of motion of the extremities 4. Strength intact 4. Sensation intact 4. Color and warmth equal 4. Patient ambulatory with assistance.  Neurological: She is alert and oriented to person, place, and time. She has normal strength. No cranial nerve deficit or sensory deficit. GCS eye subscore is 4. GCS verbal subscore is 5. GCS motor subscore is 6.  Skin: Skin is warm and dry.  Psychiatric: She has a normal mood and affect.  Nursing note and vitals reviewed.    ED Treatments / Results  Labs (all labs ordered are listed, but only abnormal results are displayed) Labs Reviewed  URINALYSIS, ROUTINE W REFLEX MICROSCOPIC - Abnormal; Notable for the following:         Result Value   Specific Gravity, Urine <1.005 (*)    Hgb urine dipstick MODERATE (*)    Protein, ur 30 (*)    Leukocytes, UA LARGE (*)    All other components within normal limits  CBC - Abnormal; Notable for the following:    Hemoglobin 11.2 (*)    HCT 35.8 (*)    RDW 15.7 (*)    Platelets 141 (*)    All other components within normal limits  BASIC METABOLIC PANEL - Abnormal; Notable for the following:    Glucose, Bld 105 (*)    Creatinine, Ser 1.90 (*)    Calcium 8.8 (*)    GFR calc non Af Amer 23 (*)    GFR calc Af Amer 26 (*)    All other components within normal limits  URINALYSIS, MICROSCOPIC (REFLEX) - Abnormal; Notable for the following:    Bacteria, UA FEW (*)    Squamous Epithelial / LPF 0-5 (*)    All other components within normal limits  URINE CULTURE    EKG  EKG Interpretation  Date/Time:  Thursday December 04 2016 13:39:33 EDT Ventricular Rate:  70 PR Interval:  206 QRS Duration: 78 QT Interval:  442 QTC Calculation: 477 R Axis:   -13 Text Interpretation:  Normal sinus rhythm Low voltage QRS Cannot rule out Anterior infarct , age undetermined Abnormal ECG No significant change since last tracing Confirmed by Theotis Burrow [phone]) on 12/04/2016 3:06:14 PM       Radiology No results found.  Procedures Procedures (including critical care time)  Medications Ordered in ED Medications  sodium chloride 0.9 % bolus 500 mL (500 mLs Intravenous New Bag/Given 12/04/16 1522)     Initial Impression / Assessment and Plan / ED Course  I have reviewed the triage vital signs and the nursing notes.  Pertinent labs & imaging results that were available during my care of the patient were reviewed by me and considered in my medical decision making (see chart for details).     She presented with 3 days of dizziness, dysuria, and increased urinary frequency. She denies fevers, chills, nausea, vomiting. She denies flank pain. Physical exam shows mild tenderness  to palpation in the suprapubic region. She is afebrile and not tachycardic. Will order basic labs and UA. Patient denies hitting her head, loss of consciousness, or being on blood thinners  with her falls. Right shoulder pain likely muscular, patient with full range of motion without difficulty. Neurovascularly intact. Case discussed with attending, and Dr. Rex Kras evaluated the pt.  Physical labs reassuring. UA shows signs of UTI. Will send for urine culture. As patient has allergy to multiple antibiotics, discussed with pharmacy. Will discharge with prescription for fosfomycin. At this time, patient appears safe for discharge. Patient to follow-up with primary care as needed. Return precautions given. Patient states she understands and agrees to plan.   Final Clinical Impressions(s) / ED Diagnoses   Final diagnoses:  Lower urinary tract infectious disease    New Prescriptions New Prescriptions   FOSFOMYCIN (MONUROL) 3 G PACK    Take on dose today and one dose in 2 days     Franchot Heidelberg, PA-C 12/04/16 1650    Little, Wenda Overland, MD 12/16/16 1009

## 2016-12-04 NOTE — ED Notes (Addendum)
Pt and sister states they understasnd inbstructions. Home stable via wc with sister.

## 2016-12-04 NOTE — Discharge Instructions (Signed)
It is very important that you stay well-hydrated with water. Take fosfomycin tonight and again in 2 days. Follow-up with your primary care doctor in 3 days if symptoms are not improving. Return to the emergency room if you develop fever, chills, worsening pain, worsening dizziness, or any new or worsening symptoms.

## 2016-12-06 LAB — URINE CULTURE

## 2017-01-01 ENCOUNTER — Ambulatory Visit (INDEPENDENT_AMBULATORY_CARE_PROVIDER_SITE_OTHER): Payer: Medicare Other | Admitting: Neurology

## 2017-01-01 ENCOUNTER — Encounter: Payer: Self-pay | Admitting: Neurology

## 2017-01-01 VITALS — BP 147/76 | HR 66 | Ht 62.0 in | Wt 177.6 lb

## 2017-01-01 DIAGNOSIS — G40009 Localization-related (focal) (partial) idiopathic epilepsy and epileptic syndromes with seizures of localized onset, not intractable, without status epilepticus: Secondary | ICD-10-CM | POA: Diagnosis not present

## 2017-01-01 MED ORDER — LEVETIRACETAM 250 MG PO TABS: 250.0000 mg | ORAL_TABLET | Freq: Two times a day (BID) | ORAL | 3 refills | Status: DC

## 2017-01-01 NOTE — Patient Instructions (Signed)
I had a long discussion the patient and her daughter-in-law regarding her remote partial seizures which appear to be quite stable and well controlled on the current medication regimen. Continue Keppra 250 mg twice daily. Patient was given a refill for a year. Continue follow-up in a year or if patient's primary care physician can prescribe it then she does not even need follow-up.

## 2017-01-01 NOTE — Progress Notes (Signed)
[facility]  PATIENT: [name] [name] DOB: [birth year]   HISTORY FROM: patient, chart REASON FOR VISIT: follow up  HISTORY OF PRESENT ILLNESS:  Ms. Caily Rakers is a 83 year old right-handed Caucasian female with a history of persistent right frontal and parietal superficial cortical lesion of undetermined etiology despite brain biopsy. She had a questionable partial seizure at the time of the biopsy. She was placed on Keppra but has had difficulty with tolerance and has been excessively sleepy during the day with his medication dose was increased. She is currently taking only 250 mg per day. She admits to poor sleep. She has difficulty going to sleep and staying asleep. She is a long-standing problem with dizziness and sense of imbalance. She's been told to be careful when changing positions because of orthostatic hypotension. She has trouble walking, needs to have a cane or person assistance to walk. She says she has been told she has some vertigo in the past. New neurologic complaints. No seizure activity since last seen. ROS.  UPDATE 10/19/2012: Patient returns for followup since last visit on 10/20/2011. No reported changes since last visit. No new neurologic complaints. No seizure activity since last seen. UPDATE 11/10/2013 : She returns for followup after last visit a year ago. She continues to do well without recurrent seizures now for nearly 5 years. She is taking Keppra 250 mg orally at night. She has a new complaint of mild headaches that she is having once or twice per week. She describes this mainly in the occipital region and occasionally over the vertex does headache pressure like in quality. She takes one or 2 Tylenol tablets which works quite well. She denies significant neck pain or radicular pain or arthritis. She has no other new complaints. Update 9/14/206 :SShe has recurrent UTI`s .he returns for f/u after last visit 1 year ago accompanied by daughter.She continues to  do well without recurrent seizures. She was unable to get keppra Xr from her insurance hence has been taking keppra 250 mg twice daily instead and tolerating it. She lives with son and daughter in law and is independent in ADLs but uses cane for walk and has to be careful with her balance and had 1 fall 1 year ago. But none since then. Update 12/20/2015 ; she returns for follow-up after last visit a year ago. She is accompanied by her daughter-in-law. She continues to do well without recurrent seizures or any other episodes. She has had a few falls and was seen in the emergency room on 06/28/15 as well as 12/18/15. CT scan of the head & neck  and x-rays are unremarkable. These falls occur mainly in the dark when she is not careful when getting up and walking. She does use a cane usually and is careful. She is tolerating Keppra 250 mg twice daily without any side effects. She has no new complaints. Update 12/31/2016 ; She returns for follow-up after last visit a year ago. She is accompanied by daughter-in-law. She continues to do well without recurrent seizures. She is tolerating Keppra 250 twice daily without any side effects. She was hospitalized 3 times in the last year for urinary tract infection and dehydration. She has moved into assisted living facility since April this year. She's had no falls or injuries and is careful and uses a walker most of the time. REVIEW OF SYSTEMS: Full 14 system review of systems performed and notable only for:  No complaints today and all the systems negative  ALLERGIES: Allergies  Allergen Reactions  . Adhesive [Tape] Other (See Comments)    Tears patients skin off.   [name] [Cephalexin] Nausea And Vomiting  . Latex Itching  . Penicillins Hives and Itching  . Sulfa Antibiotics Hives and Itching    HOME MEDICATIONS: Outpatient Medications Prior to Visit  Medication Sig Dispense Refill  . ALPRAZolam (XANAX) 0.5 MG tablet Take 0.5-1 tablets (0.25-0.5 mg total)  by mouth 2 (two) times daily as needed for anxiety (or agitation). For nerves 6 tablet 0  . amLODipine (NORVASC) 5 MG tablet Take 2.5 mg by mouth daily.     . BELSOMRA 20 MG TABS Take 20 mg by mouth at bedtime.     . CRESTOR 20 MG tablet Take 20 mg by mouth daily.     . fosfomycin (MONUROL) 3 g PACK Take on dose today and one dose in 2 days 3 g 1  . gabapentin (NEURONTIN) 300 MG capsule Take 300 mg by mouth 2 (two) times daily.     Marland Kitchen HYDROcodone-acetaminophen (NORCO) 10-325 MG tablet Take 1 tablet by mouth 4 (four) times daily. 12 tablet 0  . L-Methylfolate-Algae-B12-B6 (FOLTANX RF) 3-90.314-2-35 MG CAPS Take 1 capsule by mouth daily.    Marland Kitchen LINZESS 145 MCG CAPS capsule Take 145 mcg by mouth at bedtime.     . Magnesium 250 MG TABS Take 250 mg by mouth daily.     . Multiple Vitamins-Minerals (PRESERVISION AREDS PO) Take 1 capsule by mouth daily.    Marland Kitchen MYRBETRIQ 25 MG TB24 tablet Take 25 mg by mouth at bedtime.     . nitroGLYCERIN (NITROSTAT) 0.4 MG SL tablet Place 0.4 mg under the tongue every 5 (five) minutes as needed for chest pain.    . polyethylene glycol (MIRALAX / GLYCOLAX) packet Take 17 g by mouth daily as needed for moderate constipation.    . levETIRAcetam (KEPPRA) 250 MG tablet Take 1 tablet (250 mg total) by mouth 2 (two) times daily. 180 tablet 3  . cefpodoxime (VANTIN) 200 MG tablet Take 1 tablet (200 mg total) by mouth daily. (Patient not taking: Reported on 01/01/2017) 8 tablet 0   No facility-administered medications prior to visit.     PAST MEDICAL HISTORY: Past Medical History:  Diagnosis Date  . Back pain   . Bladder cancer ([place])   . CAD (coronary artery disease)   . CKD (chronic kidney disease), stage IV ([place])   . Dizziness   . 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 Essential hypertension, benign   . Mixed hyperlipidemia   . Obesity   . Postsurgical aortocoronary bypass status   . Seizure ([place])     PAST SURGICAL HISTORY: Past Surgical History:  Procedure  Laterality Date  . CATARACT EXTRACTION    . CORONARY ARTERY BYPASS GRAFT    . IRIDECTOMY    . NEPHRECTOMY  2000    FAMILY HISTORY: Family History  Problem Relation Age of Onset  . Colon cancer Mother   . Heart attack Father   . Diabetes Father   . Stroke Unknown   . Coronary artery disease Unknown   . Cancer Unknown   . Diabetes Unknown     SOCIAL HISTORY: Social History   Social History  . Marital status: Widowed    Spouse name: N/A  . Number of children: 3  . Years of education: N/A   Occupational History  .      n/a   Social History Main Topics  .  Smoking status: Never Smoker  . Smokeless tobacco: Never Used  . Alcohol use No  . Drug use: No  . Sexual activity: No   Other Topics Concern  . Not on file   Social History Narrative   Patient lives at home with his son and daughter in law.   Caffeine Use: 2 cups of caffeine daily.      PHYSICAL EXAM  Vitals:   01/01/17 1437  BP: (!) 147/76  Pulse: 66  Weight: 177 lb 9.6 oz (80.6 kg)  Height: 5\' 2"  (1.575 m)   Body mass index is 32.48 kg/m.  Generalized: frail elderly Caucasian lady, seated, and no evidence of stress   Neck: Supple, no carotid bruits   Cardiac: Regular rate rhythm, no murmur   Pulmonary: Clear to auscultation bilaterally large midline sternal lipoma  Musculoskeletal: No deformity   Neurological examination   Mentation: Alert oriented to time, place, history taking, language fluent, and causual conversation  Cranial nerve II-XII: Pupils were equal round reactive to light extraocular movements were full, visual field were full on confrontational test. facial sensation and strength were normal. hearing was intact to finger rubbing bilaterally. Uvula tongue midline. head turning and shoulder shrug and were normal and symmetric.Tongue protrusion into cheek strength was normal. Neck flexion and extension normal. MOTOR: normal bulk and tone, full strength on the right, 4/5 on the left.  Diminished grip on left. no pronator drift SENSORY: normal and symmetric to light touch, pinprick, temperature, vibration and proprioception COORDINATION: finger-nose-finger, heel-to-shin bilaterally, rapid alternating movement is normal on the right, slower on left REFLEXES: 1+ and symmetric GAIT/STATION: Rises from chair with use of hands. Stance is broad-based. Hemiparetic gait. ambulates with A walker   ASSESSMENT AND PLAN Ms. Lynde Ludwig is a 83 year old right-handed Caucasian female with a history of persistent right frontal and parietal superficial cortical lesion of undetermined etiology despite brain biopsy. She had a questionable partial seizure at the time of the biopsy. She is on low-dose Keppra, 250 mg per day.  EEG 06/21/2008 revealed right central epileptiform discharges. She remains stable without seizures PLAN :I had a long discussion with the patient and her daughter-in-law regarding her simple partial seizures which seem to be quite well controlled on the current low dose of Keppra. Continue Keppra and the current dose of keppra 250 mg twice daily. Patient was given a refill for 1 year.  she was also counseled about fall and safety precautions. Return for follow-up in a year or call earlier if necessary or if patient's primary care physician can prescribe it then she does not even need follow-up. Greater than 50% time during this 25 minute visit was spent on counseling and coordination of care about her simple partial seizures and answered questions  .    Antony Contras, MD  01/01/2017, 3:11 PM  [facility] [address] [place], [place] [address] [phone]

## 2017-01-23 DIAGNOSIS — H353131 Nonexudative age-related macular degeneration, bilateral, early dry stage: Secondary | ICD-10-CM | POA: Diagnosis not present

## 2017-04-28 DIAGNOSIS — I10 Essential (primary) hypertension: Secondary | ICD-10-CM | POA: Diagnosis not present

## 2017-04-28 DIAGNOSIS — R7301 Impaired fasting glucose: Secondary | ICD-10-CM | POA: Diagnosis not present

## 2017-04-28 DIAGNOSIS — D509 Iron deficiency anemia, unspecified: Secondary | ICD-10-CM | POA: Diagnosis not present

## 2017-04-28 DIAGNOSIS — E782 Mixed hyperlipidemia: Secondary | ICD-10-CM | POA: Diagnosis not present

## 2017-04-30 DIAGNOSIS — M792 Neuralgia and neuritis, unspecified: Secondary | ICD-10-CM | POA: Diagnosis not present

## 2017-04-30 DIAGNOSIS — I10 Essential (primary) hypertension: Secondary | ICD-10-CM | POA: Diagnosis not present

## 2017-04-30 DIAGNOSIS — E782 Mixed hyperlipidemia: Secondary | ICD-10-CM | POA: Diagnosis not present

## 2017-04-30 DIAGNOSIS — G894 Chronic pain syndrome: Secondary | ICD-10-CM | POA: Diagnosis not present

## 2017-04-30 DIAGNOSIS — M5430 Sciatica, unspecified side: Secondary | ICD-10-CM | POA: Diagnosis not present

## 2017-06-16 DIAGNOSIS — N390 Urinary tract infection, site not specified: Secondary | ICD-10-CM | POA: Diagnosis not present

## 2017-06-16 DIAGNOSIS — E782 Mixed hyperlipidemia: Secondary | ICD-10-CM | POA: Diagnosis not present

## 2017-07-15 DIAGNOSIS — M5430 Sciatica, unspecified side: Secondary | ICD-10-CM | POA: Diagnosis not present

## 2017-07-15 DIAGNOSIS — M792 Neuralgia and neuritis, unspecified: Secondary | ICD-10-CM | POA: Diagnosis not present

## 2017-07-15 DIAGNOSIS — E782 Mixed hyperlipidemia: Secondary | ICD-10-CM | POA: Diagnosis not present

## 2017-07-15 DIAGNOSIS — D509 Iron deficiency anemia, unspecified: Secondary | ICD-10-CM | POA: Diagnosis not present

## 2017-07-15 DIAGNOSIS — M545 Low back pain: Secondary | ICD-10-CM | POA: Diagnosis not present

## 2017-07-15 DIAGNOSIS — I2510 Atherosclerotic heart disease of native coronary artery without angina pectoris: Secondary | ICD-10-CM | POA: Diagnosis not present

## 2017-07-15 DIAGNOSIS — I10 Essential (primary) hypertension: Secondary | ICD-10-CM | POA: Diagnosis not present

## 2017-07-15 DIAGNOSIS — N184 Chronic kidney disease, stage 4 (severe): Secondary | ICD-10-CM | POA: Diagnosis not present

## 2017-07-15 DIAGNOSIS — G894 Chronic pain syndrome: Secondary | ICD-10-CM | POA: Diagnosis not present

## 2017-07-20 DIAGNOSIS — N323 Diverticulum of bladder: Secondary | ICD-10-CM | POA: Diagnosis not present

## 2017-07-20 DIAGNOSIS — Z8551 Personal history of malignant neoplasm of bladder: Secondary | ICD-10-CM | POA: Diagnosis not present

## 2017-07-20 DIAGNOSIS — N3020 Other chronic cystitis without hematuria: Secondary | ICD-10-CM | POA: Diagnosis not present

## 2017-08-24 DIAGNOSIS — D509 Iron deficiency anemia, unspecified: Secondary | ICD-10-CM | POA: Diagnosis not present

## 2017-08-24 DIAGNOSIS — J060 Acute laryngopharyngitis: Secondary | ICD-10-CM | POA: Diagnosis not present

## 2017-09-09 DIAGNOSIS — R600 Localized edema: Secondary | ICD-10-CM | POA: Diagnosis not present

## 2017-09-09 DIAGNOSIS — N183 Chronic kidney disease, stage 3 (moderate): Secondary | ICD-10-CM | POA: Diagnosis not present

## 2017-09-09 DIAGNOSIS — R300 Dysuria: Secondary | ICD-10-CM | POA: Diagnosis not present

## 2017-09-09 DIAGNOSIS — I129 Hypertensive chronic kidney disease with stage 1 through stage 4 chronic kidney disease, or unspecified chronic kidney disease: Secondary | ICD-10-CM | POA: Diagnosis not present

## 2017-09-09 DIAGNOSIS — Z905 Acquired absence of kidney: Secondary | ICD-10-CM | POA: Diagnosis not present

## 2017-10-09 DIAGNOSIS — N3020 Other chronic cystitis without hematuria: Secondary | ICD-10-CM | POA: Diagnosis not present

## 2017-10-09 DIAGNOSIS — R3914 Feeling of incomplete bladder emptying: Secondary | ICD-10-CM | POA: Diagnosis not present

## 2017-10-12 DIAGNOSIS — N183 Chronic kidney disease, stage 3 (moderate): Secondary | ICD-10-CM | POA: Diagnosis not present

## 2017-10-13 DIAGNOSIS — N3020 Other chronic cystitis without hematuria: Secondary | ICD-10-CM | POA: Diagnosis not present

## 2017-10-27 DIAGNOSIS — R1084 Generalized abdominal pain: Secondary | ICD-10-CM | POA: Diagnosis not present

## 2017-10-27 DIAGNOSIS — R600 Localized edema: Secondary | ICD-10-CM | POA: Diagnosis not present

## 2017-10-27 DIAGNOSIS — R339 Retention of urine, unspecified: Secondary | ICD-10-CM | POA: Diagnosis not present

## 2017-10-27 DIAGNOSIS — N183 Chronic kidney disease, stage 3 (moderate): Secondary | ICD-10-CM | POA: Diagnosis not present

## 2017-11-10 DIAGNOSIS — G894 Chronic pain syndrome: Secondary | ICD-10-CM | POA: Diagnosis not present

## 2017-11-10 DIAGNOSIS — D509 Iron deficiency anemia, unspecified: Secondary | ICD-10-CM | POA: Diagnosis not present

## 2017-11-10 DIAGNOSIS — M5430 Sciatica, unspecified side: Secondary | ICD-10-CM | POA: Diagnosis not present

## 2017-11-10 DIAGNOSIS — J060 Acute laryngopharyngitis: Secondary | ICD-10-CM | POA: Diagnosis not present

## 2017-11-10 DIAGNOSIS — R531 Weakness: Secondary | ICD-10-CM | POA: Diagnosis not present

## 2017-11-10 DIAGNOSIS — N183 Chronic kidney disease, stage 3 (moderate): Secondary | ICD-10-CM | POA: Diagnosis not present

## 2017-11-10 DIAGNOSIS — I10 Essential (primary) hypertension: Secondary | ICD-10-CM | POA: Diagnosis not present

## 2017-11-10 DIAGNOSIS — E782 Mixed hyperlipidemia: Secondary | ICD-10-CM | POA: Diagnosis not present

## 2017-12-15 DIAGNOSIS — H353134 Nonexudative age-related macular degeneration, bilateral, advanced atrophic with subfoveal involvement: Secondary | ICD-10-CM | POA: Diagnosis not present

## 2017-12-15 DIAGNOSIS — H353222 Exudative age-related macular degeneration, left eye, with inactive choroidal neovascularization: Secondary | ICD-10-CM | POA: Diagnosis not present

## 2017-12-15 DIAGNOSIS — H353212 Exudative age-related macular degeneration, right eye, with inactive choroidal neovascularization: Secondary | ICD-10-CM | POA: Diagnosis not present

## 2017-12-15 DIAGNOSIS — H43812 Vitreous degeneration, left eye: Secondary | ICD-10-CM | POA: Diagnosis not present

## 2018-01-11 ENCOUNTER — Other Ambulatory Visit: Payer: Self-pay

## 2018-01-11 ENCOUNTER — Ambulatory Visit: Payer: Medicare Other | Admitting: Neurology

## 2018-01-11 ENCOUNTER — Encounter: Payer: Self-pay | Admitting: Neurology

## 2018-01-11 DIAGNOSIS — G40009 Localization-related (focal) (partial) idiopathic epilepsy and epileptic syndromes with seizures of localized onset, not intractable, without status epilepticus: Secondary | ICD-10-CM

## 2018-01-11 MED ORDER — LEVETIRACETAM 250 MG PO TABS: 250.0000 mg | ORAL_TABLET | Freq: Two times a day (BID) | ORAL | 3 refills | Status: DC

## 2018-01-11 NOTE — Patient Instructions (Signed)
I had a long discussion with the patient and her sister regarding her remote seizures which appear to be well controlled on the low-dose Keppra 250 mg  twice daily.  I recommend she stay on this medication for lifelong.  I gave her a prescription refill for a year.  She was advised to see her primary care physician will take over this prescription in the future and in that case I do not believe she needs to have a routine schedule appointment to see me since she is doing so well.  She and her sister voiced understanding with this plan.

## 2018-01-11 NOTE — Progress Notes (Signed)
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 She had a questionable partial seizure at the time of the biopsy. She was placed on Keppra but has had difficulty with tolerance and has been excessively sleepy during the day with his medication dose was increased. She is currently taking only 250 mg per day. She admits to poor sleep. She has difficulty going to sleep and staying asleep. She is a long-standing problem with dizziness and sense of imbalance. She's been told to be careful when changing positions because of orthostatic hypotension. She has trouble walking, needs to have a cane or person assistance to walk. She says she has been told she has some vertigo in the past. New neurologic complaints. No seizure activity since last seen. ROS.  UPDATE 10/19/2012: Patient returns for followup since last visit on 10/20/2011. No reported changes since last visit. No new neurologic complaints. No seizure activity since last seen. UPDATE 11/10/2013 : She returns for followup after last visit a year ago. She continues to do well without recurrent seizures now for nearly 5 years. She is taking Keppra 250 mg orally at night. She has a new complaint of mild headaches that she is having once or twice per week. She describes this mainly in the occipital region and occasionally over the vertex does headache pressure like in quality. She takes one or 2 Tylenol tablets which works quite well. She denies significant neck pain or radicular pain or arthritis. She has no other new complaints. Update 9/14/206 :SShe has recurrent UTI`s .he returns for f/u after last visit 1 year ago accompanied by daughter.She continues to  do well without recurrent seizures. She was unable to get keppra Xr from her insurance hence has been taking keppra 250 mg twice daily instead and tolerating it. She lives with son and daughter in law and is independent in ADLs but uses cane for walk and has to be careful with her balance and had 1 fall 1 year ago. But none since then. Update 12/20/2015 ; she returns for follow-up after last visit a year ago. She is accompanied by her daughter-in-law. She continues to do well without recurrent seizures or any other episodes. She has had a few falls and was seen in the emergency room on 06/28/15 as well as 12/18/15. CT scan of the head & neck  and x-rays are unremarkable. These falls occur mainly in the dark when she is not careful when getting up and walking. She does use a cane usually and is careful. She is tolerating Keppra 250 mg twice daily without any side effects. She has no new complaints. Update 12/31/2016 ; She returns for follow-up after last visit a year ago. She is accompanied by daughter-in-law. She continues to do well without recurrent seizures. She is tolerating Keppra 250 twice daily without any side effects. She was hospitalized 3 times in the last year for urinary tract infection and dehydration. She has moved into assisted living facility since April this year. She's had no falls or injuries and is careful and uses a walker most of the time. Update 01/11/2018.  She returns for follow-up after last visit a year ago.  She is accompanied by her sister.  She continues to  do well and has not had seizures now for a long time ever since her initial one.  She remains on Keppra 250 mg twice daily which she is tolerating well without any side effects.  She continues to live in assisted living.  She uses a walker at all times and is very careful.  She has not had any falls or injuries. REVIEW OF SYSTEMS: Full 14 system review of systems performed and notable only for:  Fatigue, eye itching, blurred vision,  shortness of breath, chest tightness, chest pain, leg swelling, constipation, insomnia, frequency of urination, back pain, muscle cramps, walking difficulty and all the systems negative     ALLERGIES: Allergies  Allergen Reactions  . Adhesive [Tape] Other (See Comments)    Tears patients skin off.   [name] [Cephalexin] Nausea And Vomiting  . Latex Itching  . Penicillins Hives and Itching  . Sulfa Antibiotics Hives and Itching    HOME MEDICATIONS: Outpatient Medications Prior to Visit  Medication Sig Dispense Refill  . amLODipine (NORVASC) 2.5 MG tablet Take 2.5 mg by mouth daily.  1  . BELSOMRA 20 MG TABS Take 20 mg by mouth at bedtime.     . Cranberry 250 MG TABS Take by mouth.    . CRESTOR 20 MG tablet Take 20 mg by mouth daily.     Marland Kitchen gabapentin (NEURONTIN) 300 MG capsule Take 300 mg by mouth 2 (two) times daily.     Marland Kitchen HYDROcodone-acetaminophen (NORCO) 10-325 MG tablet Take 1 tablet by mouth 4 (four) times daily. 12 tablet 0  . LINZESS 145 MCG CAPS capsule Take 145 mcg by mouth at bedtime.     . Magnesium 250 MG TABS Take 250 mg by mouth daily.     . Multiple Vitamins-Minerals (CENTRUM ULTRA WOMENS) TABS Take by mouth.    . Multiple Vitamins-Minerals (PRESERVISION AREDS PO) Take 1 capsule by mouth daily.    Marland Kitchen MYRBETRIQ 25 MG TB24 tablet Take 25 mg by mouth at bedtime.     . polyethylene glycol (MIRALAX / GLYCOLAX) packet Take 17 g by mouth daily as needed for moderate constipation.    . Pumpkin Seed-Soy Germ (AZO BLADDER CONTROL/GO-LESS) CAPS Take by mouth.    . levETIRAcetam (KEPPRA) 250 MG tablet Take 1 tablet (250 mg total) by mouth 2 (two) times daily. 180 tablet 3  . ALPRAZolam (XANAX) 0.5 MG tablet Take 0.5-1 tablets (0.25-0.5 mg total) by mouth 2 (two) times daily as needed for anxiety (or agitation). For nerves (Patient not taking: Reported on 01/11/2018) 6 tablet 0  . amLODipine (NORVASC) 5 MG tablet Take 2.5 mg by mouth daily.     . fosfomycin (MONUROL) 3 g PACK Take on  dose today and one dose in 2 days (Patient not taking: Reported on 01/11/2018) 3 g 1  . L-Methylfolate-Algae-B12-B6 (FOLTANX RF) 3-90.314-2-35 MG CAPS Take 1 capsule by mouth daily.    . nitroGLYCERIN (NITROSTAT) 0.4 MG SL tablet Place 0.4 mg under the tongue every 5 (five) minutes as needed for chest pain.    Marland Kitchen torsemide (DEMADEX) 10 MG tablet Take 10 mg by mouth every other day.  1   No facility-administered medications prior to visit.     PAST MEDICAL HISTORY: Past Medical History:  Diagnosis Date  . Back pain   . Bladder cancer ([place])   . CAD (coronary artery disease)   . CKD (chronic kidney disease), stage IV ([place])   . Dizziness   . 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)   .  Essential hypertension, benign   . Mixed hyperlipidemia   . Obesity   . Postsurgical aortocoronary bypass status   . Seizure ([place])     PAST SURGICAL HISTORY: Past Surgical History:  Procedure Laterality Date  . CATARACT EXTRACTION    . CORONARY ARTERY BYPASS GRAFT    . IRIDECTOMY    . NEPHRECTOMY  2000    FAMILY HISTORY: Family History  Problem Relation Age of Onset  . Colon cancer Mother   . Heart attack Father   . Diabetes Father   . Stroke Unknown   . Coronary artery disease Unknown   . Cancer Unknown   . Diabetes Unknown     SOCIAL HISTORY: Social History   Socioeconomic History  . Marital status: Widowed    Spouse name: Not on file  . Number of children: 3  . Years of education: Not on file  . Highest education level: Not on file  Occupational History    Comment: n/a  Social Needs  . Financial resource strain: Not on file  . Food insecurity:    Worry: Not on file    Inability: Not on file  . Transportation needs:    Medical: Not on file    Non-medical: Not on file  Tobacco Use  . Smoking status: Never Smoker  . Smokeless tobacco: Never Used  Substance and Sexual Activity  . Alcohol use: No  . Drug use: No  . Sexual activity: Never    Birth control/protection: None    Lifestyle  . Physical activity:    Days per week: Not on file    Minutes per session: Not on file  . Stress: Not on file  Relationships  . Social connections:    Talks on phone: Not on file    Gets together: Not on file    Attends religious service: Not on file    Active member of club or organization: Not on file    Attends meetings of clubs or organizations: Not on file    Relationship status: Not on file  . Intimate partner violence:    Fear of current or ex partner: Not on file    Emotionally abused: Not on file    Physically abused: Not on file    Forced sexual activity: Not on file  Other Topics Concern  . Not on file  Social History Narrative   Patient lives at home with his son and daughter in law.   Caffeine Use: 2 cups of caffeine daily.      PHYSICAL EXAM  Vitals:   01/11/18 1436  BP: (!) 142/89  Pulse: 63  Weight: 179 lb 3.2 oz (81.3 kg)  Height: 5\' 2"  (1.575 m)   Body mass index is 32.78 kg/m.  Generalized: frail elderly Caucasian lady, seated, and no evidence of stress   Neck: Supple, no carotid bruits   Cardiac: Regular rate rhythm, no murmur   Pulmonary: Clear to auscultation bilaterally large midline sternal lipoma  Musculoskeletal: No deformity   Neurological examination   Mentation: Alert oriented to time, place, history taking, language fluent, and causual conversation  Cranial nerve II-XII: Pupils were equal round reactive to light extraocular movements were full, visual field were full on confrontational test. facial sensation and strength were normal. hearing was diminished bilaterally to finger rubbing bilaterally. Uvula tongue midline. head turning and shoulder shrug and were normal and symmetric.Tongue protrusion into cheek strength was normal. Neck flexion and extension normal. MOTOR: normal bulk and tone, full strength on  the right, 4/5 on the left. Diminished grip on left. no pronator drift SENSORY: normal and symmetric to light  touch, pinprick, temperature, vibration and proprioception COORDINATION: finger-nose-finger, heel-to-shin bilaterally, rapid alternating movement is normal on the right, slower on left REFLEXES: 1+ and symmetric GAIT/STATION: Rises from chair with use of hands. Stance is broad-based. Hemiparetic gait drags left leg a bit. ambulates with a walker   ASSESSMENT AND PLAN [name] [name] is a 84 year old right-handed Caucasian female with a history of persistent right frontal and parietal superficial cortical lesion of undetermined etiology despite brain biopsy. She had a questionable partial seizure at the time of the biopsy. She is on low-dose Keppra, 250 mg per day.  EEG 06/21/2008 revealed right central epileptiform discharges. She remains stable without seizures  PLAN I had a long discussion with the patient and her sister regarding her remote seizures which appear to be well controlled on the low-dose Keppra 250 mg  twice daily.  I recommend she stay on this  medication for lifelong.  I gave her a prescription refill for a year.  She was advised to see her primary care physician will take over this prescription in the future and in that case I do not believe she needs to have a routine schedule appointment to see me since she is doing so well.  She and her sister voiced understanding with this plan.up. Greater than 50% time during this 25 minute visit was spent on counseling and coordination of care about her simple partial seizures and answered questions  .    Antony Contras, MD  01/11/2018, 3:00 PM  [facility] [address], [place], [place] [address] [phone]

## 2018-01-21 DIAGNOSIS — E782 Mixed hyperlipidemia: Secondary | ICD-10-CM | POA: Diagnosis not present

## 2018-01-21 DIAGNOSIS — I10 Essential (primary) hypertension: Secondary | ICD-10-CM | POA: Diagnosis not present

## 2018-01-21 DIAGNOSIS — D509 Iron deficiency anemia, unspecified: Secondary | ICD-10-CM | POA: Diagnosis not present

## 2018-01-21 DIAGNOSIS — M5430 Sciatica, unspecified side: Secondary | ICD-10-CM | POA: Diagnosis not present

## 2018-01-21 DIAGNOSIS — J060 Acute laryngopharyngitis: Secondary | ICD-10-CM | POA: Diagnosis not present

## 2018-01-26 DIAGNOSIS — I10 Essential (primary) hypertension: Secondary | ICD-10-CM | POA: Diagnosis not present

## 2018-01-26 DIAGNOSIS — M545 Low back pain: Secondary | ICD-10-CM | POA: Diagnosis not present

## 2018-01-26 DIAGNOSIS — E782 Mixed hyperlipidemia: Secondary | ICD-10-CM | POA: Diagnosis not present

## 2018-01-26 DIAGNOSIS — I2510 Atherosclerotic heart disease of native coronary artery without angina pectoris: Secondary | ICD-10-CM | POA: Diagnosis not present

## 2018-01-26 DIAGNOSIS — N184 Chronic kidney disease, stage 4 (severe): Secondary | ICD-10-CM | POA: Diagnosis not present

## 2018-02-03 DIAGNOSIS — Z23 Encounter for immunization: Secondary | ICD-10-CM | POA: Diagnosis not present

## 2018-02-03 DIAGNOSIS — I10 Essential (primary) hypertension: Secondary | ICD-10-CM | POA: Diagnosis not present

## 2018-02-03 DIAGNOSIS — E782 Mixed hyperlipidemia: Secondary | ICD-10-CM | POA: Diagnosis not present

## 2018-04-14 DIAGNOSIS — N184 Chronic kidney disease, stage 4 (severe): Secondary | ICD-10-CM | POA: Diagnosis not present

## 2018-04-14 DIAGNOSIS — I10 Essential (primary) hypertension: Secondary | ICD-10-CM | POA: Diagnosis not present

## 2018-04-14 DIAGNOSIS — E782 Mixed hyperlipidemia: Secondary | ICD-10-CM | POA: Diagnosis not present

## 2018-04-14 DIAGNOSIS — I2510 Atherosclerotic heart disease of native coronary artery without angina pectoris: Secondary | ICD-10-CM | POA: Diagnosis not present

## 2018-04-15 DIAGNOSIS — I10 Essential (primary) hypertension: Secondary | ICD-10-CM | POA: Diagnosis not present

## 2018-04-15 DIAGNOSIS — I2510 Atherosclerotic heart disease of native coronary artery without angina pectoris: Secondary | ICD-10-CM | POA: Diagnosis not present

## 2018-04-15 DIAGNOSIS — R7301 Impaired fasting glucose: Secondary | ICD-10-CM | POA: Diagnosis not present

## 2018-04-15 DIAGNOSIS — N184 Chronic kidney disease, stage 4 (severe): Secondary | ICD-10-CM | POA: Diagnosis not present

## 2018-04-15 DIAGNOSIS — E782 Mixed hyperlipidemia: Secondary | ICD-10-CM | POA: Diagnosis not present

## 2018-06-01 DIAGNOSIS — R6884 Jaw pain: Secondary | ICD-10-CM | POA: Diagnosis not present

## 2018-07-08 DIAGNOSIS — R600 Localized edema: Secondary | ICD-10-CM | POA: Diagnosis not present

## 2018-07-19 DIAGNOSIS — L03113 Cellulitis of right upper limb: Secondary | ICD-10-CM | POA: Diagnosis not present

## 2018-07-29 DIAGNOSIS — F5101 Primary insomnia: Secondary | ICD-10-CM | POA: Diagnosis not present

## 2018-07-29 DIAGNOSIS — L03113 Cellulitis of right upper limb: Secondary | ICD-10-CM | POA: Diagnosis not present

## 2018-07-29 DIAGNOSIS — G4710 Hypersomnia, unspecified: Secondary | ICD-10-CM | POA: Diagnosis not present

## 2018-07-29 DIAGNOSIS — G894 Chronic pain syndrome: Secondary | ICD-10-CM | POA: Diagnosis not present

## 2018-08-08 ENCOUNTER — Inpatient Hospital Stay (HOSPITAL_COMMUNITY)
Admission: EM | Admit: 2018-08-08 | Discharge: 2018-08-10 | DRG: 085 | Disposition: A | Discharge status: Skilled Nursing Facility | Payer: Medicare Other | Attending: Internal Medicine | Admitting: Internal Medicine

## 2018-08-08 ENCOUNTER — Observation Stay (HOSPITAL_COMMUNITY): Payer: Medicare Other

## 2018-08-08 ENCOUNTER — Encounter (HOSPITAL_COMMUNITY): Payer: Self-pay | Admitting: Internal Medicine

## 2018-08-08 ENCOUNTER — Other Ambulatory Visit: Payer: Self-pay

## 2018-08-08 ENCOUNTER — Emergency Department (HOSPITAL_COMMUNITY): Payer: Medicare Other

## 2018-08-08 DIAGNOSIS — R296 Repeated falls: Secondary | ICD-10-CM | POA: Diagnosis present

## 2018-08-08 DIAGNOSIS — M5489 Other dorsalgia: Secondary | ICD-10-CM | POA: Diagnosis not present

## 2018-08-08 DIAGNOSIS — Z951 Presence of aortocoronary bypass graft: Secondary | ICD-10-CM | POA: Diagnosis not present

## 2018-08-08 DIAGNOSIS — E782 Mixed hyperlipidemia: Secondary | ICD-10-CM | POA: Diagnosis present

## 2018-08-08 DIAGNOSIS — N184 Chronic kidney disease, stage 4 (severe): Secondary | ICD-10-CM | POA: Diagnosis present

## 2018-08-08 DIAGNOSIS — G894 Chronic pain syndrome: Secondary | ICD-10-CM | POA: Diagnosis present

## 2018-08-08 DIAGNOSIS — Z8551 Personal history of malignant neoplasm of bladder: Secondary | ICD-10-CM

## 2018-08-08 DIAGNOSIS — E785 Hyperlipidemia, unspecified: Secondary | ICD-10-CM | POA: Diagnosis present

## 2018-08-08 DIAGNOSIS — S199XXA Unspecified injury of neck, initial encounter: Secondary | ICD-10-CM | POA: Diagnosis not present

## 2018-08-08 DIAGNOSIS — Y92129 Unspecified place in nursing home as the place of occurrence of the external cause: Secondary | ICD-10-CM

## 2018-08-08 DIAGNOSIS — S065X0A Traumatic subdural hemorrhage without loss of consciousness, initial encounter: Principal | ICD-10-CM | POA: Diagnosis present

## 2018-08-08 DIAGNOSIS — Z79891 Long term (current) use of opiate analgesic: Secondary | ICD-10-CM | POA: Diagnosis not present

## 2018-08-08 DIAGNOSIS — E669 Obesity, unspecified: Secondary | ICD-10-CM | POA: Diagnosis present

## 2018-08-08 DIAGNOSIS — G40909 Epilepsy, unspecified, not intractable, without status epilepticus: Secondary | ICD-10-CM | POA: Diagnosis not present

## 2018-08-08 DIAGNOSIS — Z6829 Body mass index (BMI) 29.0-29.9, adult: Secondary | ICD-10-CM

## 2018-08-08 DIAGNOSIS — I2510 Atherosclerotic heart disease of native coronary artery without angina pectoris: Secondary | ICD-10-CM | POA: Diagnosis present

## 2018-08-08 DIAGNOSIS — Z20828 Contact with and (suspected) exposure to other viral communicable diseases: Secondary | ICD-10-CM | POA: Diagnosis present

## 2018-08-08 DIAGNOSIS — Z880 Allergy status to penicillin: Secondary | ICD-10-CM

## 2018-08-08 DIAGNOSIS — Z66 Do not resuscitate: Secondary | ICD-10-CM | POA: Diagnosis present

## 2018-08-08 DIAGNOSIS — Z91048 Other nonmedicinal substance allergy status: Secondary | ICD-10-CM

## 2018-08-08 DIAGNOSIS — S065XAA Traumatic subdural hemorrhage with loss of consciousness status unknown, initial encounter: Secondary | ICD-10-CM | POA: Diagnosis present

## 2018-08-08 DIAGNOSIS — Z882 Allergy status to sulfonamides status: Secondary | ICD-10-CM

## 2018-08-08 DIAGNOSIS — I129 Hypertensive chronic kidney disease with stage 1 through stage 4 chronic kidney disease, or unspecified chronic kidney disease: Secondary | ICD-10-CM | POA: Diagnosis present

## 2018-08-08 DIAGNOSIS — S065X0D Traumatic subdural hemorrhage without loss of consciousness, subsequent encounter: Secondary | ICD-10-CM | POA: Diagnosis not present

## 2018-08-08 DIAGNOSIS — S066X0A Traumatic subarachnoid hemorrhage without loss of consciousness, initial encounter: Secondary | ICD-10-CM | POA: Diagnosis not present

## 2018-08-08 DIAGNOSIS — Z91040 Latex allergy status: Secondary | ICD-10-CM

## 2018-08-08 DIAGNOSIS — W19XXXA Unspecified fall, initial encounter: Secondary | ICD-10-CM

## 2018-08-08 DIAGNOSIS — S0101XA Laceration without foreign body of scalp, initial encounter: Secondary | ICD-10-CM | POA: Diagnosis not present

## 2018-08-08 DIAGNOSIS — S0990XA Unspecified injury of head, initial encounter: Secondary | ICD-10-CM | POA: Diagnosis not present

## 2018-08-08 DIAGNOSIS — I10 Essential (primary) hypertension: Secondary | ICD-10-CM | POA: Diagnosis not present

## 2018-08-08 DIAGNOSIS — Z79899 Other long term (current) drug therapy: Secondary | ICD-10-CM | POA: Diagnosis not present

## 2018-08-08 DIAGNOSIS — R001 Bradycardia, unspecified: Secondary | ICD-10-CM | POA: Diagnosis not present

## 2018-08-08 DIAGNOSIS — I6200 Nontraumatic subdural hemorrhage, unspecified: Secondary | ICD-10-CM | POA: Diagnosis not present

## 2018-08-08 DIAGNOSIS — G9341 Metabolic encephalopathy: Secondary | ICD-10-CM | POA: Diagnosis present

## 2018-08-08 DIAGNOSIS — W010XXA Fall on same level from slipping, tripping and stumbling without subsequent striking against object, initial encounter: Secondary | ICD-10-CM | POA: Diagnosis present

## 2018-08-08 DIAGNOSIS — I609 Nontraumatic subarachnoid hemorrhage, unspecified: Secondary | ICD-10-CM

## 2018-08-08 DIAGNOSIS — Z905 Acquired absence of kidney: Secondary | ICD-10-CM | POA: Diagnosis not present

## 2018-08-08 DIAGNOSIS — S065X9A Traumatic subdural hemorrhage with loss of consciousness of unspecified duration, initial encounter: Secondary | ICD-10-CM | POA: Diagnosis present

## 2018-08-08 LAB — CBC WITH DIFFERENTIAL/PLATELET
Abs Immature Granulocytes: 0.02 10*3/uL (ref 0.00–0.07)
Basophils Absolute: 0.1 10*3/uL (ref 0.0–0.1)
Basophils Relative: 1 %
Eosinophils Absolute: 0.4 10*3/uL (ref 0.0–0.5)
Eosinophils Relative: 5 %
HCT: 39.7 % (ref 36.0–46.0)
Hemoglobin: 12.5 g/dL (ref 12.0–15.0)
Immature Granulocytes: 0 %
Lymphocytes Relative: 35 %
Lymphs Abs: 3 10*3/uL (ref 0.7–4.0)
MCH: 29.8 pg (ref 26.0–34.0)
MCHC: 31.5 g/dL (ref 30.0–36.0)
MCV: 94.5 fL (ref 80.0–100.0)
Monocytes Absolute: 0.7 10*3/uL (ref 0.1–1.0)
Monocytes Relative: 9 %
Neutro Abs: 4.4 10*3/uL (ref 1.7–7.7)
Neutrophils Relative %: 50 %
Platelets: 160 10*3/uL (ref 150–400)
RBC: 4.2 MIL/uL (ref 3.87–5.11)
RDW: 14.3 % (ref 11.5–15.5)
WBC: 8.6 10*3/uL (ref 4.0–10.5)
nRBC: 0 % (ref 0.0–0.2)

## 2018-08-08 LAB — BASIC METABOLIC PANEL
Anion gap: 14 (ref 5–15)
BUN: 29 mg/dL — ABNORMAL HIGH (ref 8–23)
CO2: 23 mmol/L (ref 22–32)
Calcium: 9.6 mg/dL (ref 8.9–10.3)
Chloride: 103 mmol/L (ref 98–111)
Creatinine, Ser: 2.77 mg/dL — ABNORMAL HIGH (ref 0.44–1.00)
GFR calc Af Amer: 17 mL/min — ABNORMAL LOW (ref >60)
GFR calc non Af Amer: 15 mL/min — ABNORMAL LOW (ref >60)
Glucose, Bld: 106 mg/dL — ABNORMAL HIGH (ref 70–99)
Potassium: 5 mmol/L (ref 3.5–5.1)
Sodium: 140 mmol/L (ref 135–145)

## 2018-08-08 LAB — PROTIME-INR
INR: 1.1 (ref 0.8–1.2)
Prothrombin Time: 14.5 seconds (ref 11.4–15.2)

## 2018-08-08 LAB — SARS CORONAVIRUS 2 BY RT PCR (HOSPITAL ORDER, PERFORMED IN ~~LOC~~ HOSPITAL LAB): SARS Coronavirus 2: NEGATIVE

## 2018-08-08 MED ORDER — SUVOREXANT 20 MG PO TABS: 20.0000 mg | ORAL_TABLET | Freq: Every day | ORAL | Status: DC

## 2018-08-08 MED ORDER — ROSUVASTATIN CALCIUM 20 MG PO TABS
20.0000 mg | ORAL_TABLET | Freq: Every day | ORAL | Status: DC
  Administered 2018-08-08 – 2018-08-10 (×3): 20 mg via ORAL
  Filled 2018-08-08 (×3): qty 1

## 2018-08-08 MED ORDER — LEVETIRACETAM 250 MG PO TABS
250.0000 mg | ORAL_TABLET | Freq: Two times a day (BID) | ORAL | Status: DC
  Administered 2018-08-08 – 2018-08-10 (×5): 250 mg via ORAL
  Filled 2018-08-08 (×8): qty 1

## 2018-08-08 MED ORDER — MIRABEGRON ER 25 MG PO TB24
25.0000 mg | ORAL_TABLET | Freq: Every day | ORAL | Status: DC
  Administered 2018-08-08 – 2018-08-09 (×2): 25 mg via ORAL
  Filled 2018-08-08 (×3): qty 1

## 2018-08-08 MED ORDER — GABAPENTIN 300 MG PO CAPS
300.0000 mg | ORAL_CAPSULE | Freq: Two times a day (BID) | ORAL | Status: DC
  Administered 2018-08-08 – 2018-08-09 (×3): 300 mg via ORAL
  Filled 2018-08-08 (×3): qty 1

## 2018-08-08 MED ORDER — SODIUM CHLORIDE 0.9% FLUSH
3.0000 mL | Freq: Two times a day (BID) | INTRAVENOUS | Status: DC
  Administered 2018-08-08 – 2018-08-10 (×5): 3 mL via INTRAVENOUS

## 2018-08-08 MED ORDER — POLYETHYLENE GLYCOL 3350 17 G PO PACK: 17.0000 g | PACK | Freq: Every day | ORAL | Status: DC | PRN

## 2018-08-08 MED ORDER — ACETAMINOPHEN 650 MG RE SUPP: 650.0000 mg | Freq: Four times a day (QID) | RECTAL | Status: DC | PRN

## 2018-08-08 MED ORDER — AMLODIPINE BESYLATE 5 MG PO TABS
2.5000 mg | ORAL_TABLET | Freq: Every day | ORAL | Status: DC
  Administered 2018-08-08 – 2018-08-10 (×3): 2.5 mg via ORAL
  Filled 2018-08-08 (×3): qty 1

## 2018-08-08 MED ORDER — ACETAMINOPHEN 325 MG PO TABS
650.0000 mg | ORAL_TABLET | Freq: Once | ORAL | Status: AC
  Administered 2018-08-08: 650 mg via ORAL
  Filled 2018-08-08: qty 2

## 2018-08-08 MED ORDER — DOCUSATE SODIUM 100 MG PO CAPS
100.0000 mg | ORAL_CAPSULE | Freq: Two times a day (BID) | ORAL | Status: DC
  Administered 2018-08-08 – 2018-08-10 (×5): 100 mg via ORAL
  Filled 2018-08-08 (×5): qty 1

## 2018-08-08 MED ORDER — ONDANSETRON HCL 4 MG/2ML IJ SOLN: 4.0000 mg | Freq: Four times a day (QID) | INTRAMUSCULAR | Status: DC | PRN

## 2018-08-08 MED ORDER — MORPHINE SULFATE (PF) 2 MG/ML IV SOLN
2.0000 mg | INTRAVENOUS | Status: DC | PRN
  Administered 2018-08-08 – 2018-08-09 (×2): 2 mg via INTRAVENOUS
  Filled 2018-08-08 (×2): qty 1

## 2018-08-08 MED ORDER — HYDROCODONE-ACETAMINOPHEN 10-325 MG PO TABS
1.0000 | ORAL_TABLET | Freq: Four times a day (QID) | ORAL | Status: DC
  Administered 2018-08-08 – 2018-08-09 (×4): 1 via ORAL
  Filled 2018-08-08 (×4): qty 1

## 2018-08-08 MED ORDER — ONDANSETRON HCL 4 MG PO TABS: 4.0000 mg | ORAL_TABLET | Freq: Four times a day (QID) | ORAL | Status: DC | PRN

## 2018-08-08 MED ORDER — ACETAMINOPHEN 325 MG PO TABS
650.0000 mg | ORAL_TABLET | Freq: Four times a day (QID) | ORAL | Status: DC | PRN
  Administered 2018-08-09: 650 mg via ORAL
  Filled 2018-08-08: qty 2

## 2018-08-08 MED ORDER — LINACLOTIDE 145 MCG PO CAPS
145.0000 ug | ORAL_CAPSULE | Freq: Every day | ORAL | Status: DC
  Administered 2018-08-08 – 2018-08-09 (×2): 145 ug via ORAL
  Filled 2018-08-08 (×3): qty 1

## 2018-08-08 NOTE — ED Triage Notes (Addendum)
Patient was attempting to get the remote for the TV in the dark and when she grabbed the door she lost balance and fell. Denies LOC. States has a walker, but didn't use because she was just "walking across the room".

## 2018-08-08 NOTE — ED Provider Notes (Signed)
[facility] EMERGENCY DEPARTMENT Provider Note   CSN: [id number] Arrival date & time: 08/08/18  0257    History   Chief Complaint Chief Complaint  Patient presents with   Fall    HPI [name] [name] is a 85 y.o. female.     85 year old female with a history of hyperlipidemia, hypertension, CAD, CKD presents to the ED following a fall at home.  She states that she was frustrated at her roommate for keeping the TV on.  She got up to get the TV remote without the use of her walker, and lost her balance causing her to fall.  She denies loss of consciousness as well as any subsequent nausea or vomiting.  She is not on chronic anticoagulation.  Notes a very mild headache.  No medications taken prior to arrival for pain.  Denies any extremity numbness or paresthesias, vision changes.  Ambulates with a walker at baseline.  The history is provided by the patient. No language interpreter was used.  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[place])    Essential hypertension, benign    Mixed hyperlipidemia    Obesity    Postsurgical aortocoronary bypass status    Seizure [facility])     Patient Active Problem List   Diagnosis Date Noted   Pyelonephritis 09/09/2016   Falls frequently 06/24/2016   Tension headache 11/10/2013   Chest pain 11/03/2011   ARF (acute renal failure) ([place]) 04/30/2011   UTI (urinary tract infection) 04/30/2011   CKD (chronic kidney disease), stage IV ([place]) 04/30/2011   Anemia 04/30/2011   S/p nephrectomy 04/30/2011   Macular degeneration 04/30/2011   Cataract 04/30/2011   Bladder cancer ([place]) 04/30/2011   Weakness generalized 04/29/2011   Seizure disorder ([place]) 04/29/2011   Benign hypertension 04/29/2011   CAD (coronary artery disease) 04/29/2011    Hyperlipidemia 04/29/2011   Anxiety 04/29/2011   Dizziness - light-headed 04/29/2011   Chronic abdominal pain 04/29/2011   Mixed hyperlipidemia 05/24/2008   Essential hypertension, benign 05/24/2008   Postsurgical aortocoronary bypass status 05/24/2008    Past Surgical History:  Procedure Laterality Date   CATARACT EXTRACTION     CORONARY ARTERY BYPASS GRAFT     IRIDECTOMY     NEPHRECTOMY  2000     OB History    Gravida  3   Para  3   Term  3   Preterm      AB      Living  3     SAB      TAB      Ectopic      Multiple      Live Births               Home Medications    Prior to Admission medications   Medication Sig Start Date End Date Taking? Authorizing Provider  amLODipine (NORVASC) 2.5 MG tablet Take 2.5 mg by mouth daily. 10/29/17   [provider]  BELSOMRA 20 MG TABS Take 20 mg by mouth at bedtime.  12/15/14   [provider]  Cranberry 250 MG TABS Take by mouth.    [provider]  CRESTOR 20 MG tablet Take 20 mg by mouth daily.  01/06/11   [provider]  gabapentin (NEURONTIN) 300 MG capsule Take 300 mg by mouth 2 (two) times daily.  11/20/15   [provider]  HYDROcodone-acetaminophen (NORCO) 10-325 MG tablet Take 1 tablet by mouth 4 (four) times daily. 09/11/16   Patrecia Pour, MD  levETIRAcetam (KEPPRA) 250 MG tablet Take 1 tablet (250 mg total) by mouth 2 (two) times daily. 01/11/18   Garvin Fila, MD  LINZESS 145 MCG CAPS capsule Take 145 mcg by mouth at bedtime.  12/18/14   [provider]  Magnesium 250 MG TABS Take 250 mg by mouth daily.     [provider]  Multiple Vitamins-Minerals (CENTRUM ULTRA WOMENS) TABS Take by mouth.    [provider]  Multiple Vitamins-Minerals (PRESERVISION AREDS PO) Take 1 capsule by mouth daily.    [provider]  MYRBETRIQ 25 MG TB24 tablet Take 25 mg by mouth at bedtime.  11/28/14   [provider]   polyethylene glycol (MIRALAX / GLYCOLAX) packet Take 17 g by mouth daily as needed for moderate constipation.    [provider]  Pumpkin Seed-Soy Germ (AZO BLADDER CONTROL/GO-LESS) CAPS Take by mouth.    [provider]    Family History Family History  Problem Relation Age of Onset   Colon cancer Mother    Heart attack Father    Diabetes Father    Stroke Unknown    Coronary artery disease Unknown    Cancer Unknown    Diabetes Unknown     Social History Social History   Tobacco Use   Smoking status: Never Smoker   Smokeless tobacco: Never Used  Substance Use Topics   Alcohol use: No   Drug use: No     Allergies   Adhesive [tape]; Keflet [cephalexin]; Latex; Penicillins; and Sulfa antibiotics   Review of Systems Review of Systems Ten systems reviewed and are negative for acute change, except as noted in the HPI.    Physical Exam Updated Vital Signs BP (!) 165/107    Pulse 78    Temp 98.1 F (36.7 C) (Oral)    Resp 17    Ht 5\' 2"  (1.575 m)    Wt 72.6 kg    SpO2 97%    BMI 29.26 kg/m   Physical Exam Vitals signs and nursing note reviewed.  Constitutional:      General: She is not in acute distress.    Appearance: She is well-developed. She is not diaphoretic.     Comments: Alert, pleasant; nontoxic.  HENT:     Head: Normocephalic.      Comments: 3cm laceration to posterior scalp. No battle sign or raccoon's eyes.    Mouth/Throat:     Mouth: Mucous membranes are moist.  Eyes:     General: No scleral icterus.    Extraocular Movements: Extraocular movements intact.     Conjunctiva/sclera: Conjunctivae normal.     Pupils: Pupils are equal, round, and reactive to light.  Neck:     Comments: Cervical collar in place Pulmonary:     Effort: Pulmonary effort is normal. No respiratory distress.     Comments: Respirations even and unlabored Musculoskeletal: Normal range of motion.  Skin:    General: Skin is warm and dry.      Coloration: Skin is not pale.     Findings: No erythema or rash.  Neurological:     General: No focal deficit present.     Mental Status: She is alert and oriented to person, place, and  time.     Coordination: Coordination normal.     Comments: GCS 15.  Speech is goal oriented.  Patient answers questions appropriately and follows commands.  She is moving all extremities spontaneously.  Psychiatric:        Behavior: Behavior normal.      ED Treatments / Results  Labs (all labs ordered are listed, but only abnormal results are displayed) Labs Reviewed  BASIC METABOLIC PANEL - Abnormal; Notable for the following components:      Result Value   Glucose, Bld 106 (*)    BUN 29 (*)    Creatinine, Ser 2.77 (*)    GFR calc non Af Amer 15 (*)    GFR calc Af Amer 17 (*)    All other components within normal limits  SARS CORONAVIRUS 2 (HOSPITAL ORDER, [place] [facility])  CBC WITH DIFFERENTIAL/PLATELET  PROTIME-INR    EKG None  Radiology Ct Head Wo Contrast  Result Date: 08/08/2018 CLINICAL DATA:  Fall. EXAM: CT HEAD WITHOUT CONTRAST CT CERVICAL SPINE WITHOUT CONTRAST TECHNIQUE: Multidetector CT imaging of the head and cervical spine was performed following the standard protocol without intravenous contrast. Multiplanar CT image reconstructions of the cervical spine were also generated. COMPARISON:  04/04/2016 FINDINGS: CT HEAD FINDINGS Brain: There is interhemispheric hyperdense blood noted between the frontal lobes compatible with small subdural hematoma. Blood is also noted along the right side of the falx, best seen on coronal imaging. There is also subarachnoid blood noted medially in the right parietal lobe seen on axial image 25 and sagittal image 32-33. No mass effect or midline shift. No intraparenchymal hemorrhage. Old right basal ganglia lacunar infarcts. Old right posterior parietal infarct underlying craniotomy defect. There is atrophy and chronic small vessel  disease changes. Vascular: No hyperdense vessel or unexpected calcification. Skull: No acute calvarial abnormality. Prior right parietal craniotomy. Sinuses/Orbits: No acute finding Other: None CT CERVICAL SPINE FINDINGS Alignment: No subluxation Skull base and vertebrae: No acute fracture. No primary bone lesion or focal pathologic process. Soft tissues and spinal canal: No prevertebral fluid or swelling. No visible canal hematoma. Disc levels: Fusion across the C2-3 disc space. Degenerative disc and facet disease throughout the cervical spine. Upper chest: Calcified granulomas in the upper lobes bilaterally. No acute findings Other: Carotid artery calcifications. IMPRESSION: Subdural blood along the falx and between the frontal lobes, likely subdural hematoma. Small amount of subarachnoid blood noted in the medial right parietal lobe. No midline shift. Atrophy, chronic small vessel disease. Old right basal ganglia lacunar infarcts. No acute bony abnormality in the cervical spine. Critical Value/emergent results were called by telephone at the time of interpretation on 08/08/2018 at 3:49 am to [facility] , who verbally acknowledged these results. Electronically Signed   By: Rolm Baptise M.D.   On: 08/08/2018 03:50   Ct Cervical Spine Wo Contrast  Result Date: 08/08/2018 CLINICAL DATA:  Fall. EXAM: CT HEAD WITHOUT CONTRAST CT CERVICAL SPINE WITHOUT CONTRAST TECHNIQUE: Multidetector CT imaging of the head and cervical spine was performed following the standard protocol without intravenous contrast. Multiplanar CT image reconstructions of the cervical spine were also generated. COMPARISON:  04/04/2016 FINDINGS: CT HEAD FINDINGS Brain: There is interhemispheric hyperdense blood noted between the frontal lobes compatible with small subdural hematoma. Blood is also noted along the right side of the falx, best seen on coronal imaging. There is also subarachnoid blood noted medially in the right parietal lobe seen on  axial image 25 and sagittal image  32-33. No mass effect or midline shift. No intraparenchymal hemorrhage. Old right basal ganglia lacunar infarcts. Old right posterior parietal infarct underlying craniotomy defect. There is atrophy and chronic small vessel disease changes. Vascular: No hyperdense vessel or unexpected calcification. Skull: No acute calvarial abnormality. Prior right parietal craniotomy. Sinuses/Orbits: No acute finding Other: None CT CERVICAL SPINE FINDINGS Alignment: No subluxation Skull base and vertebrae: No acute fracture. No primary bone lesion or focal pathologic process. Soft tissues and spinal canal: No prevertebral fluid or swelling. No visible canal hematoma. Disc levels: Fusion across the C2-3 disc space. Degenerative disc and facet disease throughout the cervical spine. Upper chest: Calcified granulomas in the upper lobes bilaterally. No acute findings Other: Carotid artery calcifications. IMPRESSION: Subdural blood along the falx and between the frontal lobes, likely subdural hematoma. Small amount of subarachnoid blood noted in the medial right parietal lobe. No midline shift. Atrophy, chronic small vessel disease. Old right basal ganglia lacunar infarcts. No acute bony abnormality in the cervical spine. Critical Value/emergent results were called by telephone at the time of interpretation on 08/08/2018 at 3:49 am to [facility] , who verbally acknowledged these results. Electronically Signed   By: Rolm Baptise M.D.   On: 08/08/2018 03:50    Procedures .Critical Care Performed by: Antonietta Breach, PA-C Authorized by: Antonietta Breach, PA-C   Critical care provider statement:    Critical care time (minutes):  45   Critical care was time spent personally by me on the following activities:  Discussions with consultants, evaluation of patient's response to treatment, examination of patient, ordering and performing treatments and interventions, ordering and review of laboratory studies,  ordering and review of radiographic studies, pulse oximetry, re-evaluation of patient's condition, obtaining history from patient or surrogate and review of old charts   (including critical care time)  LACERATION REPAIR Performed by: Antonietta Breach Authorized by: Antonietta Breach Consent: Verbal consent obtained. Risks and benefits: risks, benefits and alternatives were discussed Consent given by: patient Patient identity confirmed: provided demographic data Prepped and Draped in normal sterile fashion Wound explored  Laceration Location: L posterior parietal/occipital  Laceration Length: 3cm  No Foreign Bodies seen or palpated  Amount of cleaning: standard  Skin closure: staples  Number of staples: 3  Technique: simple  Patient tolerance: Patient tolerated the procedure well with no immediate complications.   Medications Ordered in ED Medications  acetaminophen (TYLENOL) tablet 650 mg (650 mg Oral Given 08/08/18 0516)    4:11 AM Case discussed with neurosurgery.  They do recommend inpatient observation with repeat head CT in 6 to 8 hours.  Nothing requiring emergent surgical intervention at this time.  Requesting medicine admission for obs.  Neurosurgery to consult on patient case in the AM.   Initial Impression / Assessment and Plan / ED Course  I have reviewed the triage vital signs and the nursing notes.  Pertinent labs & imaging results that were available during my care of the patient were reviewed by me and considered in my medical decision making (see chart for details).        85 year old female presents to the emergency department following a fall at home.  Found to have both small subdural and subarachnoid blood on head CT.  She sustained a laceration to her left posterior parietal scalp which was closed with staples.  Neurosurgery to consult on patient case in the morning, but present imaging does not meet criteria for surgical intervention.  Neurosurgery advises  repeat CT scan in 8 hours.  Patient to be admitted for observation under the hospitalist service.   Final Clinical Impressions(s) / ED Diagnoses   Final diagnoses:  Traumatic subdural hematoma without loss of consciousness, initial encounter [facility])  Subarachnoid hemorrhage ([place])  Laceration of scalp, initial encounter  Fall, initial encounter    ED Discharge Orders    None       Antonietta Breach, PA-C 81/44/39 [id number]    Delora Fuel, MD 97/87/76 [phone]

## 2018-08-08 NOTE — ED Notes (Signed)
ED TO INPATIENT HANDOFF REPORT  ED Nurse Name and Phone #:  Lowella Petties [phone]  S Name/Age/Gender [name] [name] 85 y.o. female Room/Bed: 030C/030C  Code Status   Code Status: DNR  Home/SNF/Other Home Patient oriented to: self, place, time and situation Is this baseline? Yes   Triage Complete: Triage complete  Chief Complaint fall; head injury   Triage Note Patient was attempting to get the remote for the TV in the dark and when she grabbed the door she lost balance and fell. Denies LOC. States has a walker, but didn't use because she was just "walking across the room".   Allergies Allergies  Allergen Reactions  . Adhesive [Tape] Other (See Comments)    Tears patients skin off.   [name] [Cephalexin] Nausea And Vomiting  . Latex Itching  . Penicillins Hives and Itching  . Sulfa Antibiotics Hives and Itching    Level of Care/Admitting Diagnosis ED Disposition    ED Disposition Condition Comment   Admit  Hospital Area: [facility] [100100]  Level of Care: Progressive [102]  I expect the patient will be discharged within 24 hours: No (not a candidate for 5C-Observation unit)  Covid Evaluation: N/A  Diagnosis: Traumatic subdural hematoma [facility]) [[id number]]  Admitting Physician: Karmen Bongo [[id number]]  Attending Physician: Karmen Bongo [[id number]]  Bed request comments: Neuro-progressive  PT Class (Do Not Modify): Observation [104]  PT Acc Code (Do Not Modify): Observation [10022]       B Medical/Surgery History Past Medical History:  Diagnosis Date  . Back pain   . Bladder cancer ([place])   . CAD (coronary artery disease)   . CKD (chronic kidney disease), stage IV ([place])   . Dizziness   . 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 Essential hypertension, benign   . Mixed hyperlipidemia   . Obesity   . Postsurgical aortocoronary bypass status    Past Surgical History:  Procedure Laterality Date  . CATARACT EXTRACTION    . CORONARY ARTERY BYPASS GRAFT     . IRIDECTOMY    . NEPHRECTOMY  2000     A IV Location/Drains/Wounds Patient Lines/Drains/Airways Status   Active Line/Drains/Airways    Name:   Placement date:   Placement time:   Site:   Days:   Peripheral IV 08/08/18 Left Antecubital   08/08/18    1660    Antecubital   less than 1          Intake/Output Last 24 hours No intake or output data in the 24 hours ending 08/08/18 0809  Labs/Imaging Results for orders placed or performed during the hospital encounter of 08/08/18 (from the past 48 hour(s))  SARS Coronavirus 2 [facility] order, Performed in [place] hospital lab)     Status: None   Collection Time: 08/08/18  3:57 AM  Result Value Ref Range   SARS Coronavirus 2 NEGATIVE NEGATIVE    Comment: (NOTE) If result is NEGATIVE SARS-CoV-2 target nucleic acids are NOT DETECTED. The SARS-CoV-2 RNA is generally detectable in upper and lower  respiratory specimens during the acute phase of infection. The lowest  concentration of SARS-CoV-2 viral copies this assay can detect is 250  copies / mL. A negative result does not preclude SARS-CoV-2 infection  and should not be used as the sole basis for treatment or other  patient management decisions.  A negative result may occur with  improper specimen collection / handling, submission of specimen other  than nasopharyngeal swab, presence of viral mutation(s)  within the  areas targeted by this assay, and inadequate number of viral copies  (<250 copies / mL). A negative result must be combined with clinical  observations, patient history, and epidemiological information. If result is POSITIVE SARS-CoV-2 target nucleic acids are DETECTED. The SARS-CoV-2 RNA is generally detectable in upper and lower  respiratory specimens dur ing the acute phase of infection.  Positive  results are indicative of active infection with SARS-CoV-2.  Clinical  correlation with patient history and other diagnostic information is  necessary to determine  patient infection status.  Positive results do  not rule out bacterial infection or co-infection with other viruses. If result is PRESUMPTIVE POSTIVE SARS-CoV-2 nucleic acids MAY BE PRESENT.   A presumptive positive result was obtained on the submitted specimen  and confirmed on repeat testing.  While 2019 novel coronavirus  (SARS-CoV-2) nucleic acids may be present in the submitted sample  additional confirmatory testing may be necessary for epidemiological  and / or clinical management purposes  to differentiate between  SARS-CoV-2 and other Sarbecovirus currently known to infect humans.  If clinically indicated additional testing with an alternate test  methodology [phone]) is advised. The SARS-CoV-2 RNA is generally  detectable in upper and lower respiratory sp ecimens during the acute  phase of infection. The expected result is Negative. Fact Sheet for Patients:  StrictlyIdeas.no Fact Sheet for Healthcare Providers: BankingDealers.co.za This test is not yet approved or cleared by the [place] FDA and has been authorized for detection and/or diagnosis of SARS-CoV-2 by FDA under an Emergency Use Authorization (EUA).  This EUA will remain in effect (meaning this test can be used) for the duration of the COVID-19 declaration under Section 564(b)(1) of the Act, 21 U.S.C. section 360bbb-3(b)(1), unless the authorization is terminated or revoked sooner. Performed at [facility], [place] [address] [place], [place] [address]   Basic metabolic panel     Status: Abnormal   Collection Time: 08/08/18  5:05 AM  Result Value Ref Range   Sodium 140 135 - 145 mmol/L   Potassium 5.0 3.5 - 5.1 mmol/L   Chloride 103 98 - 111 mmol/L   CO2 23 22 - 32 mmol/L   Glucose, Bld 106 (H) 70 - 99 mg/dL   BUN 29 (H) 8 - 23 mg/dL   Creatinine, Ser 2.77 (H) 0.44 - 1.00 mg/dL   Calcium 9.6 8.9 - 10.3 mg/dL   GFR calc non Af Amer 15 (L) >60 mL/min    GFR calc Af Amer 17 (L) >60 mL/min   Anion gap 14 5 - 15    Comment: Performed at [facility], [place] [address] [place] [address]  CBC with Differential     Status: None   Collection Time: 08/08/18  5:05 AM  Result Value Ref Range   WBC 8.6 4.0 - 10.5 K/uL   RBC 4.20 3.87 - 5.11 MIL/uL   Hemoglobin 12.5 12.0 - 15.0 g/dL   HCT 39.7 36.0 - 46.0 %   MCV 94.5 80.0 - 100.0 fL   MCH 29.8 26.0 - 34.0 pg   MCHC 31.5 30.0 - 36.0 g/dL   RDW 14.3 11.5 - 15.5 %   Platelets 160 150 - 400 K/uL   nRBC 0.0 0.0 - 0.2 %   Neutrophils Relative % 50 %   Neutro Abs 4.4 1.7 - 7.7 K/uL   Lymphocytes Relative 35 %   Lymphs Abs 3.0 0.7 - 4.0 K/uL   Monocytes Relative 9 %  Monocytes Absolute 0.7 0.1 - 1.0 K/uL   Eosinophils Relative 5 %   Eosinophils Absolute 0.4 0.0 - 0.5 K/uL   Basophils Relative 1 %   Basophils Absolute 0.1 0.0 - 0.1 K/uL   Immature Granulocytes 0 %   Abs Immature Granulocytes 0.02 0.00 - 0.07 K/uL    Comment: Performed at [facility], [place] [address] [place],  [address]  Protime-INR     Status: None   Collection Time: 08/08/18  5:05 AM  Result Value Ref Range   Prothrombin Time 14.5 11.4 - 15.2 seconds   INR 1.1 0.8 - 1.2    Comment: (NOTE) INR goal varies based on device and disease states. Performed at [facility], [place] [address] [place], [place] [address]    Ct Head Wo Contrast  Result Date: 08/08/2018 CLINICAL DATA:  Fall. EXAM: CT HEAD WITHOUT CONTRAST CT CERVICAL SPINE WITHOUT CONTRAST TECHNIQUE: Multidetector CT imaging of the head and cervical spine was performed following the standard protocol without intravenous contrast. Multiplanar CT image reconstructions of the cervical spine were also generated. COMPARISON:  04/04/2016 FINDINGS: CT HEAD FINDINGS Brain: There is interhemispheric hyperdense blood noted between the frontal lobes compatible with small subdural hematoma. Blood is also noted along the right side of the falx, best seen  on coronal imaging. There is also subarachnoid blood noted medially in the right parietal lobe seen on axial image 25 and sagittal image 32-33. No mass effect or midline shift. No intraparenchymal hemorrhage. Old right basal ganglia lacunar infarcts. Old right posterior parietal infarct underlying craniotomy defect. There is atrophy and chronic small vessel disease changes. Vascular: No hyperdense vessel or unexpected calcification. Skull: No acute calvarial abnormality. Prior right parietal craniotomy. Sinuses/Orbits: No acute finding Other: None CT CERVICAL SPINE FINDINGS Alignment: No subluxation Skull base and vertebrae: No acute fracture. No primary bone lesion or focal pathologic process. Soft tissues and spinal canal: No prevertebral fluid or swelling. No visible canal hematoma. Disc levels: Fusion across the C2-3 disc space. Degenerative disc and facet disease throughout the cervical spine. Upper chest: Calcified granulomas in the upper lobes bilaterally. No acute findings Other: Carotid artery calcifications. IMPRESSION: Subdural blood along the falx and between the frontal lobes, likely subdural hematoma. Small amount of subarachnoid blood noted in the medial right parietal lobe. No midline shift. Atrophy, chronic small vessel disease. Old right basal ganglia lacunar infarcts. No acute bony abnormality in the cervical spine. Critical Value/emergent results were called by telephone at the time of interpretation on 08/08/2018 at 3:49 am to [facility] , who verbally acknowledged these results. Electronically Signed   By: Rolm Baptise M.D.   On: 08/08/2018 03:50   Ct Cervical Spine Wo Contrast  Result Date: 08/08/2018 CLINICAL DATA:  Fall. EXAM: CT HEAD WITHOUT CONTRAST CT CERVICAL SPINE WITHOUT CONTRAST TECHNIQUE: Multidetector CT imaging of the head and cervical spine was performed following the standard protocol without intravenous contrast. Multiplanar CT image reconstructions of the cervical spine were  also generated. COMPARISON:  04/04/2016 FINDINGS: CT HEAD FINDINGS Brain: There is interhemispheric hyperdense blood noted between the frontal lobes compatible with small subdural hematoma. Blood is also noted along the right side of the falx, best seen on coronal imaging. There is also subarachnoid blood noted medially in the right parietal lobe seen on axial image 25 and sagittal image 32-33. No mass effect or midline shift. No intraparenchymal hemorrhage. Old right basal ganglia lacunar infarcts. Old right posterior parietal infarct underlying craniotomy defect.  There is atrophy and chronic small vessel disease changes. Vascular: No hyperdense vessel or unexpected calcification. Skull: No acute calvarial abnormality. Prior right parietal craniotomy. Sinuses/Orbits: No acute finding Other: None CT CERVICAL SPINE FINDINGS Alignment: No subluxation Skull base and vertebrae: No acute fracture. No primary bone lesion or focal pathologic process. Soft tissues and spinal canal: No prevertebral fluid or swelling. No visible canal hematoma. Disc levels: Fusion across the C2-3 disc space. Degenerative disc and facet disease throughout the cervical spine. Upper chest: Calcified granulomas in the upper lobes bilaterally. No acute findings Other: Carotid artery calcifications. IMPRESSION: Subdural blood along the falx and between the frontal lobes, likely subdural hematoma. Small amount of subarachnoid blood noted in the medial right parietal lobe. No midline shift. Atrophy, chronic small vessel disease. Old right basal ganglia lacunar infarcts. No acute bony abnormality in the cervical spine. Critical Value/emergent results were called by telephone at the time of interpretation on 08/08/2018 at 3:49 am to [facility] , who verbally acknowledged these results. Electronically Signed   By: Rolm Baptise M.D.   On: 08/08/2018 03:50    Pending Labs Unresulted Labs (From admission, onward)    Start     Ordered   08/09/18 8182   Basic metabolic panel  Tomorrow morning,   R     08/08/18 0747   08/09/18 0500  CBC  Tomorrow morning,   R     08/08/18 0747          Vitals/Pain Today's Vitals   08/08/18 0445 08/08/18 0530 08/08/18 0621 08/08/18 0630  BP: (!) 165/107 (!) 147/60  (!) 140/54  Pulse: 78 72  71  Resp: 17 15  11   Temp:      TempSrc:      SpO2: 97% 95%  97%  Weight:      Height:      PainSc:   Asleep     Isolation Precautions No active isolations  Medications Medications  HYDROcodone-acetaminophen (NORCO) 10-325 MG per tablet 1 tablet (has no administration in time range)  amLODipine (NORVASC) tablet 2.5 mg (has no administration in time range)  rosuvastatin (CRESTOR) tablet 20 mg (has no administration in time range)  Suvorexant TABS 20 mg (has no administration in time range)  linaclotide (LINZESS) capsule 145 mcg (has no administration in time range)  polyethylene glycol (MIRALAX / GLYCOLAX) packet 17 g (has no administration in time range)  mirabegron ER (MYRBETRIQ) tablet 25 mg (has no administration in time range)  gabapentin (NEURONTIN) capsule 300 mg (has no administration in time range)  levETIRAcetam (KEPPRA) tablet 250 mg (has no administration in time range)  acetaminophen (TYLENOL) tablet 650 mg (has no administration in time range)    Or  acetaminophen (TYLENOL) suppository 650 mg (has no administration in time range)  docusate sodium (COLACE) capsule 100 mg (has no administration in time range)  ondansetron (ZOFRAN) tablet 4 mg (has no administration in time range)    Or  ondansetron (ZOFRAN) injection 4 mg (has no administration in time range)  sodium chloride flush (NS) 0.9 % injection 3 mL (has no administration in time range)  morphine 2 MG/ML injection 2 mg (has no administration in time range)  acetaminophen (TYLENOL) tablet 650 mg (650 mg Oral Given 08/08/18 0516)    Mobility walks with device High fall risk   Focused Assessments Neuro Assessment  Handoff:  Swallow screen pass? not yet performed   NIH Stroke Scale ( + Modified Stroke Scale Criteria)  Level of Consciousness (  1a.)   : Alert, keenly responsive LOC Questions (1b. )   +: Answers one question correctly LOC Commands (1c. )   + : Performs both tasks correctly Best Gaze (2. )  +: Normal Visual (3. )  +: No visual loss Facial Palsy (4. )    : Normal symmetrical movements Motor Arm, Left (5a. )   +: No drift Motor Arm, Right (5b. )   +: No drift Motor Leg, Left (6a. )   +: No drift Motor Leg, Right (6b. )   +: No drift Limb Ataxia (7. ): Absent Sensory (8. )   +: Normal, no sensory loss Best Language (9. )   +: No aphasia Dysarthria (10. ): Normal Extinction/Inattention (11.)   +: No Abnormality Modified SS Total  +: 1 Complete NIHSS TOTAL: 1     Neuro Assessment: Within Defined Limits Neuro Checks:      Last Documented NIHSS Modified Score: 1 (08/08/18 0718) Has TPA been given? No If patient is a Neuro Trauma and patient is going to OR before floor call report to [place] nurse: [phone] or [phone]     R Recommendations: See Admitting Provider Note  Report given to:   Additional Notes:

## 2018-08-08 NOTE — ED Notes (Signed)
ED TO INPATIENT HANDOFF REPORT  ED Nurse Name and Phone #:  [phone] - [place] Name/Age/Gender [name] [name] 85 y.o. female Room/Bed: 030C/030C  Code Status   Code Status: Prior  Home/SNF/Other Nursing Home Patient oriented to: self, place, time and situation Is this baseline? Yes   Triage Complete: Triage complete  Chief Complaint fall; head injury   Triage Note Patient was attempting to get the remote for the TV in the dark and when she grabbed the door she lost balance and fell. Denies LOC. States has a walker, but didn't use because she was just "walking across the room".   Allergies Allergies  Allergen Reactions  . Adhesive [Tape] Other (See Comments)    Tears patients skin off.   [name] [Cephalexin] Nausea And Vomiting  . Latex Itching  . Penicillins Hives and Itching  . Sulfa Antibiotics Hives and Itching    Level of Care/Admitting Diagnosis ED Disposition    ED Disposition Condition Comment   Admit  The patient appears reasonably stabilized for admission considering the current resources, flow, and capabilities available in the ED at this time, and I doubt any other [facility] requiring further screening and/or treatment in the ED prior to admission is  present.       B Medical/Surgery History Past Medical History:  Diagnosis Date  . Back pain   . Bladder cancer ([place])   . CAD (coronary artery disease)   . CKD (chronic kidney disease), stage IV ([place])   . Dizziness   . 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 Essential hypertension, benign   . Mixed hyperlipidemia   . Obesity   . Postsurgical aortocoronary bypass status   . Seizure [facility])    Past Surgical History:  Procedure Laterality Date  . CATARACT EXTRACTION    . CORONARY ARTERY BYPASS GRAFT    . IRIDECTOMY    . NEPHRECTOMY  2000     A IV Location/Drains/Wounds Patient Lines/Drains/Airways Status   Active Line/Drains/Airways    None          Intake/Output Last 24 hours No intake or  output data in the 24 hours ending 08/08/18 0537  Labs/Imaging No results found for this or any previous visit (from the past 48 hour(s)). Ct Head Wo Contrast  Result Date: 08/08/2018 CLINICAL DATA:  Fall. EXAM: CT HEAD WITHOUT CONTRAST CT CERVICAL SPINE WITHOUT CONTRAST TECHNIQUE: Multidetector CT imaging of the head and cervical spine was performed following the standard protocol without intravenous contrast. Multiplanar CT image reconstructions of the cervical spine were also generated. COMPARISON:  04/04/2016 FINDINGS: CT HEAD FINDINGS Brain: There is interhemispheric hyperdense blood noted between the frontal lobes compatible with small subdural hematoma. Blood is also noted along the right side of the falx, best seen on coronal imaging. There is also subarachnoid blood noted medially in the right parietal lobe seen on axial image 25 and sagittal image 32-33. No mass effect or midline shift. No intraparenchymal hemorrhage. Old right basal ganglia lacunar infarcts. Old right posterior parietal infarct underlying craniotomy defect. There is atrophy and chronic small vessel disease changes. Vascular: No hyperdense vessel or unexpected calcification. Skull: No acute calvarial abnormality. Prior right parietal craniotomy. Sinuses/Orbits: No acute finding Other: None CT CERVICAL SPINE FINDINGS Alignment: No subluxation Skull base and vertebrae: No acute fracture. No primary bone lesion or focal pathologic process. Soft tissues and spinal canal: No prevertebral fluid or swelling. No visible canal hematoma. Disc levels: Fusion across the C2-3 disc space.  Degenerative disc and facet disease throughout the cervical spine. Upper chest: Calcified granulomas in the upper lobes bilaterally. No acute findings Other: Carotid artery calcifications. IMPRESSION: Subdural blood along the falx and between the frontal lobes, likely subdural hematoma. Small amount of subarachnoid blood noted in the medial right parietal lobe. No  midline shift. Atrophy, chronic small vessel disease. Old right basal ganglia lacunar infarcts. No acute bony abnormality in the cervical spine. Critical Value/emergent results were called by telephone at the time of interpretation on 08/08/2018 at 3:49 am to [facility] , who verbally acknowledged these results. Electronically Signed   By: Rolm Baptise M.D.   On: 08/08/2018 03:50   Ct Cervical Spine Wo Contrast  Result Date: 08/08/2018 CLINICAL DATA:  Fall. EXAM: CT HEAD WITHOUT CONTRAST CT CERVICAL SPINE WITHOUT CONTRAST TECHNIQUE: Multidetector CT imaging of the head and cervical spine was performed following the standard protocol without intravenous contrast. Multiplanar CT image reconstructions of the cervical spine were also generated. COMPARISON:  04/04/2016 FINDINGS: CT HEAD FINDINGS Brain: There is interhemispheric hyperdense blood noted between the frontal lobes compatible with small subdural hematoma. Blood is also noted along the right side of the falx, best seen on coronal imaging. There is also subarachnoid blood noted medially in the right parietal lobe seen on axial image 25 and sagittal image 32-33. No mass effect or midline shift. No intraparenchymal hemorrhage. Old right basal ganglia lacunar infarcts. Old right posterior parietal infarct underlying craniotomy defect. There is atrophy and chronic small vessel disease changes. Vascular: No hyperdense vessel or unexpected calcification. Skull: No acute calvarial abnormality. Prior right parietal craniotomy. Sinuses/Orbits: No acute finding Other: None CT CERVICAL SPINE FINDINGS Alignment: No subluxation Skull base and vertebrae: No acute fracture. No primary bone lesion or focal pathologic process. Soft tissues and spinal canal: No prevertebral fluid or swelling. No visible canal hematoma. Disc levels: Fusion across the C2-3 disc space. Degenerative disc and facet disease throughout the cervical spine. Upper chest: Calcified granulomas in the upper  lobes bilaterally. No acute findings Other: Carotid artery calcifications. IMPRESSION: Subdural blood along the falx and between the frontal lobes, likely subdural hematoma. Small amount of subarachnoid blood noted in the medial right parietal lobe. No midline shift. Atrophy, chronic small vessel disease. Old right basal ganglia lacunar infarcts. No acute bony abnormality in the cervical spine. Critical Value/emergent results were called by telephone at the time of interpretation on 08/08/2018 at 3:49 am to [facility] , who verbally acknowledged these results. Electronically Signed   By: Rolm Baptise M.D.   On: 08/08/2018 03:50    Pending Labs Unresulted Labs (From admission, onward)    Start     Ordered   08/08/18 0402  Protime-INR  ONCE - STAT,   STAT     08/08/18 0401   08/08/18 5027  Basic metabolic panel  Once,   STAT     08/08/18 0357   08/08/18 0357  CBC with Differential  Once,   STAT     08/08/18 0357   08/08/18 0357  SARS Coronavirus 2 [facility] order, Performed in [place] hospital lab)  (Novel Coronavirus, [facility] Order))  Once,   R     08/08/18 0357          Vitals/Pain Today's Vitals   08/08/18 0301 08/08/18 0330 08/08/18 0400 08/08/18 0445  BP: (!) 170/81 (!) 158/69 (!) 192/74 (!) 165/107  Pulse: 74 73 76 78  Resp: 18   17  Temp: 98.1 F (36.7 C)  TempSrc: Oral     SpO2: 99% 96% 100% 97%  Weight: 72.6 kg     Height: 5\' 2"  (1.575 m)     PainSc: 4        Isolation Precautions No active isolations  Medications Medications  acetaminophen (TYLENOL) tablet 650 mg (650 mg Oral Given 08/08/18 0516)    Mobility walks with device High fall risk   Focused Assessments Cardiac Assessment Handoff:    Lab Results  Component Value Date   CKTOTAL 195 (H) 11/03/2011   CKMB 4.0 11/03/2011   TROPONINI <0.30 11/03/2011   No results found for: DDIMER Does the Patient currently have chest pain? No  , Neuro Assessment Handoff:  Swallow screen pass? Yes           Neuro Assessment: Within Defined Limits Neuro Checks:      Last Documented NIHSS Modified Score:   Has TPA been given? No If patient is a Neuro Trauma and patient is going to OR before floor call report to [place] nurse: [phone] or [phone]     R Recommendations: See Admitting Provider Note  Report given to:   Additional Notes:  Lipoma noted in center chest (large). Stress incontinence. Lac to rear of head has been repaired.

## 2018-08-08 NOTE — ED Notes (Signed)
Patient transported to CT 

## 2018-08-08 NOTE — Care Management Obs Status (Signed)
[place] NOTIFICATION   Patient Details  Name: [name] [name] MRN: [id number] Date of Birth: 03/07/1929   Medicare Observation Status Notification Given:  Yes    Oretha Milch, LCSW 08/08/2018, 12:03 PM

## 2018-08-08 NOTE — ED Notes (Signed)
Assisted patient with bedpan 

## 2018-08-08 NOTE — Progress Notes (Signed)
Pt arrived to 4NP-5 from ED via stretcher. Pt transferred to bed. CHG bath given. Tele applied, CCMD notified. VSS. Will continue to monitor.  Amanda Cockayne, RN

## 2018-08-08 NOTE — H&P (Addendum)
History and Physical    [name] [name] WCH:[id number] DOB: Jul 27, 1928 DOA: 08/08/2018  PCP: Celene Squibb, MD Consultants:  Leonie Man - neurology;  Patient coming from:  [facility]; NOK: Duanne Moron, [phone]  Chief Complaint: Fall  HPI: [name] [name] is a 85 y.o. female with medical history significant of seizure; HLD; HTN; stage 4 CKD; CAD; and bladder cancer presenting after a fall.   Patient was in her facility and was reaching for a remote control without use of her walker when she fell and hit her head.  She reports that her head feels a little better now, although she is very sleepy after having been up much of the night.  ED Course: Carryover, per Dr. Alcario Drought:  85 yo F with fall, has small amount of SDH and SAH on CT. NS will consult in AM, but says that its likely not gonna be surgical given age. Needs repeat CT in 8 hours.  Review of Systems: As per HPI; otherwise review of systems reviewed and negative.   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 ([place])    Essential hypertension, benign    Mixed hyperlipidemia    Obesity    Postsurgical aortocoronary bypass status     Past Surgical History:  Procedure Laterality Date   CATARACT EXTRACTION     CORONARY ARTERY BYPASS GRAFT     IRIDECTOMY     NEPHRECTOMY  2000    Social History   Socioeconomic History   Marital status: Widowed    Spouse name: Not on file   Number of children: 3   Years of education: Not on file   Highest education level: Not on file  Occupational History    Comment: n/a  Social Designer, fashion/clothing strain: Not on file   Food insecurity:    Worry: Not on file    Inability: Not on file   Transportation needs:    Medical: Not on file    Non-medical: Not  on file  Tobacco Use   Smoking status: Never Smoker   Smokeless tobacco: Never Used  Substance and Sexual Activity   Alcohol use: No   Drug use: No   Sexual activity: Never    Birth control/protection: None  Lifestyle   Physical activity:    Days per week: Not on file    Minutes per session: Not on file   Stress: Not on file  Relationships   Social connections:    Talks on phone: Not on file    Gets together: Not on file    Attends religious service: Not on file    Active member of club or organization: Not on file    Attends meetings of clubs or organizations: Not on file    Relationship status: Not on file   Intimate partner violence:    Fear of current or ex partner: Not on file    Emotionally abused: Not on file    Physically abused: Not on file    Forced sexual activity: Not on file  Other Topics Concern   Not on file  Social History Narrative   Patient lives at home with his son and daughter in [place].   Caffeine Use: 2 cups  of caffeine daily.     Allergies  Allergen Reactions   Adhesive [Tape] Other (See Comments)    Tears patients skin off.    Keflet [Cephalexin] Nausea And Vomiting   Latex Itching   Penicillins Hives and Itching   Sulfa Antibiotics Hives and Itching    Family History  Problem Relation Age of Onset   Colon cancer Mother    Heart attack Father    Diabetes Father    Stroke Unknown    Coronary artery disease Unknown    Cancer Unknown    Diabetes Unknown     Prior to Admission medications   Medication Sig Start Date End Date Taking? Authorizing Provider  amLODipine (NORVASC) 2.5 MG tablet Take 2.5 mg by mouth daily. 10/29/17   [provider]  BELSOMRA 20 MG TABS Take 20 mg by mouth at bedtime.  12/15/14   [provider]  Cranberry 250 MG TABS Take by mouth.    [provider]  CRESTOR 20 MG tablet Take 20 mg by mouth daily.  01/06/11   [provider]  gabapentin (NEURONTIN) 300 MG  capsule Take 300 mg by mouth 2 (two) times daily.  11/20/15   [provider]  HYDROcodone-acetaminophen (NORCO) 10-325 MG tablet Take 1 tablet by mouth 4 (four) times daily. 09/11/16   Patrecia Pour, MD  levETIRAcetam (KEPPRA) 250 MG tablet Take 1 tablet (250 mg total) by mouth 2 (two) times daily. 01/11/18   Garvin Fila, MD  LINZESS 145 MCG CAPS capsule Take 145 mcg by mouth at bedtime.  12/18/14   [provider]  Magnesium 250 MG TABS Take 250 mg by mouth daily.     [provider]  Multiple Vitamins-Minerals (CENTRUM ULTRA WOMENS) TABS Take by mouth.    [provider]  Multiple Vitamins-Minerals (PRESERVISION AREDS PO) Take 1 capsule by mouth daily.    [provider]  MYRBETRIQ 25 MG TB24 tablet Take 25 mg by mouth at bedtime.  11/28/14   [provider]  polyethylene glycol (MIRALAX / GLYCOLAX) packet Take 17 g by mouth daily as needed for moderate constipation.    [provider]  Pumpkin Seed-Soy Germ (AZO BLADDER CONTROL/GO-LESS) CAPS Take by mouth.    [provider]    Physical Exam: Vitals:   08/08/18 0400 08/08/18 0445 08/08/18 0530 08/08/18 0630  BP: (!) 192/74 (!) 165/107 (!) 147/60 (!) 140/54  Pulse: 76 78 72 71  Resp:  17 15 11   Temp:      TempSrc:      SpO2: 100% 97% 95% 97%  Weight:      Height:          General:  Appears calm and comfortable and is NAD; somnolent but alert and able to answer questions appropriately  Eyes:   normal lids, mostly closed   ENT:  grossly normal hearing, lips & tongue, mmm  Neck:  no LAD, masses or thyromegaly  Cardiovascular:  RRR, no r/g, 3/6 systolic murmur. No LE edema.   Respiratory:   CTA bilaterally with no wheezes/rales/rhonchi.  Normal respiratory effort.  Abdomen:  soft, NT, ND, NABS  Skin:  Occipital laceration s/p staple repair  Musculoskeletal:  grossly normal tone BUE/BLE, good ROM, no bony abnormality  Psychiatric:  somnolent mood and  affect, speech fluent and appropriate, AOx3  Neurologic:  CN 2-12 grossly intact, moves all extremities in coordinated fashion, sensation intact    Radiological Exams on Admission: Ct Head Wo Contrast  Result Date: 08/08/2018 CLINICAL DATA:  Fall. EXAM: CT HEAD WITHOUT CONTRAST CT CERVICAL SPINE WITHOUT CONTRAST TECHNIQUE: Multidetector CT imaging of the head and cervical spine was performed following the standard protocol without intravenous contrast. Multiplanar CT image reconstructions of the cervical spine were also generated. COMPARISON:  04/04/2016 FINDINGS: CT HEAD FINDINGS Brain: There is interhemispheric hyperdense blood noted between the frontal lobes compatible with small subdural hematoma. Blood is also noted along the right side of the falx, best seen on coronal imaging. There is also subarachnoid blood noted medially in the right parietal lobe seen on axial image 25 and sagittal image 32-33. No mass effect or midline shift. No intraparenchymal hemorrhage. Old right basal ganglia lacunar infarcts. Old right posterior parietal infarct underlying craniotomy defect. There is atrophy and chronic small vessel disease changes. Vascular: No hyperdense vessel or unexpected calcification. Skull: No acute calvarial abnormality. Prior right parietal craniotomy. Sinuses/Orbits: No acute finding Other: None CT CERVICAL SPINE FINDINGS Alignment: No subluxation Skull base and vertebrae: No acute fracture. No primary bone lesion or focal pathologic process. Soft tissues and spinal canal: No prevertebral fluid or swelling. No visible canal hematoma. Disc levels: Fusion across the C2-3 disc space. Degenerative disc and facet disease throughout the cervical spine. Upper chest: Calcified granulomas in the upper lobes bilaterally. No acute findings Other: Carotid artery calcifications. IMPRESSION: Subdural blood along the falx and between the frontal lobes, likely subdural hematoma. Small amount of subarachnoid  blood noted in the medial right parietal lobe. No midline shift. Atrophy, chronic small vessel disease. Old right basal ganglia lacunar infarcts. No acute bony abnormality in the cervical spine. Critical Value/emergent results were called by telephone at the time of interpretation on 08/08/2018 at 3:49 am to [facility] , who verbally acknowledged these results. Electronically Signed   By: Rolm Baptise M.D.   On: 08/08/2018 03:50   Ct Cervical Spine Wo Contrast  Result Date: 08/08/2018 CLINICAL DATA:  Fall. EXAM: CT HEAD WITHOUT CONTRAST CT CERVICAL SPINE WITHOUT CONTRAST TECHNIQUE: Multidetector CT imaging of the head and cervical spine was performed following the standard protocol without intravenous contrast. Multiplanar CT image reconstructions of the cervical spine were also generated. COMPARISON:  04/04/2016 FINDINGS: CT HEAD FINDINGS Brain: There is interhemispheric hyperdense blood noted between the frontal lobes compatible with small subdural hematoma. Blood is also noted along the right side of the falx, best seen on coronal imaging. There is also subarachnoid blood noted medially in the right parietal lobe seen on axial image 25 and sagittal image 32-33. No mass effect or midline shift. No intraparenchymal hemorrhage. Old right basal ganglia lacunar infarcts. Old right posterior parietal infarct underlying craniotomy defect. There is atrophy and chronic small vessel disease changes. Vascular: No hyperdense vessel or unexpected calcification. Skull: No acute calvarial abnormality. Prior right parietal craniotomy. Sinuses/Orbits: No acute finding Other: None CT CERVICAL SPINE FINDINGS Alignment: No subluxation Skull base and vertebrae: No acute fracture. No primary bone lesion or focal pathologic process. Soft tissues and spinal canal: No prevertebral fluid or swelling. No visible canal hematoma. Disc levels: Fusion across the C2-3 disc space. Degenerative disc and facet disease throughout the cervical  spine. Upper chest: Calcified granulomas in the upper lobes bilaterally. No acute findings Other: Carotid artery calcifications. IMPRESSION: Subdural blood along the falx and between the frontal lobes, likely subdural hematoma. Small amount of subarachnoid blood noted in the medial right parietal lobe. No midline shift. Atrophy, chronic small vessel disease. Old right basal ganglia lacunar infarcts. No acute  bony abnormality in the cervical spine. Critical Value/emergent results were called by telephone at the time of interpretation on 08/08/2018 at 3:49 am to [facility] , who verbally acknowledged these results. Electronically Signed   By: Rolm Baptise M.D.   On: 08/08/2018 03:50    EKG: not done   Labs on Admission: I have personally reviewed the available labs and imaging studies at the time of the admission.  Pertinent labs:   Glucose 106 BUN 29/Creatinine 2.77/GFR 15 Normal CBC INR 1.1 COVID negative  Assessment/Plan Principal Problem:   Traumatic subdural hematoma (HCC) Active Problems:   Seizure disorder (HCC)   Benign hypertension   Hyperlipidemia   CKD (chronic kidney disease), stage IV (HCC)   Traumatic subdural hematoma -Patient lives in an assisted living facility and had a mechanical fall overnight -She has an occipital laceration that has been repaired with staples but also was found to have a small subdural/subarachnoid hematoma -She is not on anticoagulants -Will observe overnight in neuro SDU -Repeat CT at 8 hours, as per neurosurgery -If any evolution of bleed, repeat CT tomorrow -If no evolution, will monitor overnight and anticipate d/c to home tomorrow  Seizure disorder -Continue Keppra -Given her TBI overnight, she may be at increased risk for seizures and so will place on seizure precautions  HTN -Continue Norvasc  Stage IV CKD -Uncertain baseline but reported to be stage 4 and this is consistent with current renal function    Note: This patient has  been tested and is negative for the novel coronavirus COVID-19.  DVT prophylaxis:  SCDs Code Status:  DNR - confirmed with patient Family Communication: None present; I called and spoke with her sister by telephone Disposition Plan:  Back to ALF once clinically improved Consults called: Neurosurgery; SW Admission status: It is my clinical opinion that referral for OBSERVATION is reasonable and necessary in this patient based on the above information provided. The aforementioned taken together are felt to place the patient at high risk for further clinical deterioration. However it is anticipated that the patient may be medically stable for discharge from the hospital within 24 to 48 hours.    Karmen Bongo MD [facility]   How to contact the [facility] Attending or Consulting provider [place] or covering provider during after hours [place], for this patient?  1. Check the care team in [facility] and look for a) attending/consulting TR[facility] provider listed and b) the [facility] team listed 2. Log into [email or web address] and use [place]'s universal password to access. If you do not have the password, please contact the hospital operator. 3. Locate the [facility] provider you are looking for under [facility] and page to a number that you can be directly reached. 4. If you still have difficulty reaching the provider, please page the [facility] (Director on Call) for the Hospitalists listed on amion for assistance.   08/08/2018, 7:50 AM

## 2018-08-08 NOTE — ED Notes (Signed)
Nurse starting IV and drawing labs. 

## 2018-08-08 NOTE — Consult Note (Signed)
Reason for Consult: Subdural hematoma Referring Physician: Emergency department  [name] [name] is an 85 y.o. female.  HPI: 85 year old female who lives in an assisted living facility who earlier was getting up for remote but she was upset with her roommate and she fell backwards striking her head on the floor.  Experience immediate headache denies any numbness tingling arms or legs went to the ER was worked up CT scan showed parafalcine subdural hematoma and some subarachnoid hemorrhage traumatic and we have been consulted.  Patient currently complains of headache as her primary complaint.  Past Medical History:  Diagnosis Date  . Back pain   . Bladder cancer ([place])   . CAD (coronary artery disease)   . CKD (chronic kidney disease), stage IV ([place])   . Dizziness   . 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 Essential hypertension, benign   . Mixed hyperlipidemia   . Obesity   . Postsurgical aortocoronary bypass status     Past Surgical History:  Procedure Laterality Date  . CATARACT EXTRACTION    . CORONARY ARTERY BYPASS GRAFT    . IRIDECTOMY    . NEPHRECTOMY  2000    Family History  Problem Relation Age of Onset  . Colon cancer Mother   . Heart attack Father   . Diabetes Father   . Stroke Unknown   . Coronary artery disease Unknown   . Cancer Unknown   . Diabetes Unknown     Social History:  reports that she has never smoked. She has never used smokeless tobacco. She reports that she does not drink alcohol or use drugs.  Allergies:  Allergies  Allergen Reactions  . Adhesive [Tape] Other (See Comments)    Tears patients skin off.   [name] [Cephalexin] Nausea And Vomiting  . Latex Itching  . Penicillins Hives and Itching  . Sulfa Antibiotics Hives and Itching    Medications: I have reviewed the patient's current medications.  Results for orders placed or performed during the hospital encounter of 08/08/18 (from the past 48 hour(s))  SARS Coronavirus 2 [facility] order,  Performed in [facility] hospital lab)     Status: None   Collection Time: 08/08/18  3:57 AM  Result Value Ref Range   SARS Coronavirus 2 NEGATIVE NEGATIVE    Comment: (NOTE) If result is NEGATIVE SARS-CoV-2 target nucleic acids are NOT DETECTED. The SARS-CoV-2 RNA is generally detectable in upper and lower  respiratory specimens during the acute phase of infection. The lowest  concentration of SARS-CoV-2 viral copies this assay can detect is 250  copies / mL. A negative result does not preclude SARS-CoV-2 infection  and should not be used as the sole basis for treatment or other  patient management decisions.  A negative result may occur with  improper specimen collection / handling, submission of specimen other  than nasopharyngeal swab, presence of viral mutation(s) within the  areas targeted by this assay, and inadequate number of viral copies  (<250 copies / mL). A negative result must be combined with clinical  observations, patient history, and epidemiological information. If result is POSITIVE SARS-CoV-2 target nucleic acids are DETECTED. The SARS-CoV-2 RNA is generally detectable in upper and lower  respiratory specimens dur ing the acute phase of infection.  Positive  results are indicative of active infection with SARS-CoV-2.  Clinical  correlation with patient history and other diagnostic information is  necessary to determine patient infection status.  Positive results do  not rule out bacterial  infection or co-infection with other viruses. If result is PRESUMPTIVE POSTIVE SARS-CoV-2 nucleic acids MAY BE PRESENT.   A presumptive positive result was obtained on the submitted specimen  and confirmed on repeat testing.  While 2019 novel coronavirus  (SARS-CoV-2) nucleic acids may be present in the submitted sample  additional confirmatory testing may be necessary for epidemiological  and / or clinical management purposes  to differentiate between  SARS-CoV-2 and other  Sarbecovirus currently known to infect humans.  If clinically indicated additional testing with an alternate test  methodology [phone]) is advised. The SARS-CoV-2 RNA is generally  detectable in upper and lower respiratory sp ecimens during the acute  phase of infection. The expected result is Negative. Fact Sheet for Patients:  StrictlyIdeas.no Fact Sheet for Healthcare Providers: BankingDealers.co.za This test is not yet approved or cleared by the [place] FDA and has been authorized for detection and/or diagnosis of SARS-CoV-2 by FDA under an Emergency Use Authorization (EUA).  This EUA will remain in effect (meaning this test can be used) for the duration of the COVID-19 declaration under Section 564(b)(1) of the Act, 21 U.S.C. section 360bbb-3(b)(1), unless the authorization is terminated or revoked sooner. Performed at [facility], [place] [address], [place], [place] [address]   Basic metabolic panel     Status: Abnormal   Collection Time: 08/08/18  5:05 AM  Result Value Ref Range   Sodium 140 135 - 145 mmol/L   Potassium 5.0 3.5 - 5.1 mmol/L   Chloride 103 98 - 111 mmol/L   CO2 23 22 - 32 mmol/L   Glucose, Bld 106 (H) 70 - 99 mg/dL   BUN 29 (H) 8 - 23 mg/dL   Creatinine, Ser 2.77 (H) 0.44 - 1.00 mg/dL   Calcium 9.6 8.9 - 10.3 mg/dL   GFR calc non Af Amer 15 (L) >60 mL/min   GFR calc Af Amer 17 (L) >60 mL/min   Anion gap 14 5 - 15    Comment: Performed at [facility], [place] [address] [place], [place] [address]  CBC with Differential     Status: None   Collection Time: 08/08/18  5:05 AM  Result Value Ref Range   WBC 8.6 4.0 - 10.5 K/uL   RBC 4.20 3.87 - 5.11 MIL/uL   Hemoglobin 12.5 12.0 - 15.0 g/dL   HCT 39.7 36.0 - 46.0 %   MCV 94.5 80.0 - 100.0 fL   MCH 29.8 26.0 - 34.0 pg   MCHC 31.5 30.0 - 36.0 g/dL   RDW 14.3 11.5 - 15.5 %   Platelets 160 150 - 400 K/uL   nRBC 0.0 0.0 - 0.2 %   Neutrophils  Relative % 50 %   Neutro Abs 4.4 1.7 - 7.7 K/uL   Lymphocytes Relative 35 %   Lymphs Abs 3.0 0.7 - 4.0 K/uL   Monocytes Relative 9 %   Monocytes Absolute 0.7 0.1 - 1.0 K/uL   Eosinophils Relative 5 %   Eosinophils Absolute 0.4 0.0 - 0.5 K/uL   Basophils Relative 1 %   Basophils Absolute 0.1 0.0 - 0.1 K/uL   Immature Granulocytes 0 %   Abs Immature Granulocytes 0.02 0.00 - 0.07 K/uL    Comment: Performed at [facility], [address] [place], [place] [address]  Protime-INR     Status: None   Collection Time: 08/08/18  5:05 AM  Result Value Ref Range   Prothrombin Time 14.5 11.4 - 15.2 seconds  INR 1.1 0.8 - 1.2    Comment: (NOTE) INR goal varies based on device and disease states. Performed at [facility], [place] [address] [place], [place] [address]     Ct Head Wo Contrast  Result Date: 08/08/2018 CLINICAL DATA:  Fall. EXAM: CT HEAD WITHOUT CONTRAST CT CERVICAL SPINE WITHOUT CONTRAST TECHNIQUE: Multidetector CT imaging of the head and cervical spine was performed following the standard protocol without intravenous contrast. Multiplanar CT image reconstructions of the cervical spine were also generated. COMPARISON:  04/04/2016 FINDINGS: CT HEAD FINDINGS Brain: There is interhemispheric hyperdense blood noted between the frontal lobes compatible with small subdural hematoma. Blood is also noted along the right side of the falx, best seen on coronal imaging. There is also subarachnoid blood noted medially in the right parietal lobe seen on axial image 25 and sagittal image 32-33. No mass effect or midline shift. No intraparenchymal hemorrhage. Old right basal ganglia lacunar infarcts. Old right posterior parietal infarct underlying craniotomy defect. There is atrophy and chronic small vessel disease changes. Vascular: No hyperdense vessel or unexpected calcification. Skull: No acute calvarial abnormality. Prior right parietal craniotomy. Sinuses/Orbits: No acute finding Other:  None CT CERVICAL SPINE FINDINGS Alignment: No subluxation Skull base and vertebrae: No acute fracture. No primary bone lesion or focal pathologic process. Soft tissues and spinal canal: No prevertebral fluid or swelling. No visible canal hematoma. Disc levels: Fusion across the C2-3 disc space. Degenerative disc and facet disease throughout the cervical spine. Upper chest: Calcified granulomas in the upper lobes bilaterally. No acute findings Other: Carotid artery calcifications. IMPRESSION: Subdural blood along the falx and between the frontal lobes, likely subdural hematoma. Small amount of subarachnoid blood noted in the medial right parietal lobe. No midline shift. Atrophy, chronic small vessel disease. Old right basal ganglia lacunar infarcts. No acute bony abnormality in the cervical spine. Critical Value/emergent results were called by telephone at the time of interpretation on 08/08/2018 at 3:49 am to [facility] , who verbally acknowledged these results. Electronically Signed   By: Rolm Baptise M.D.   On: 08/08/2018 03:50   Ct Cervical Spine Wo Contrast  Result Date: 08/08/2018 CLINICAL DATA:  Fall. EXAM: CT HEAD WITHOUT CONTRAST CT CERVICAL SPINE WITHOUT CONTRAST TECHNIQUE: Multidetector CT imaging of the head and cervical spine was performed following the standard protocol without intravenous contrast. Multiplanar CT image reconstructions of the cervical spine were also generated. COMPARISON:  04/04/2016 FINDINGS: CT HEAD FINDINGS Brain: There is interhemispheric hyperdense blood noted between the frontal lobes compatible with small subdural hematoma. Blood is also noted along the right side of the falx, best seen on coronal imaging. There is also subarachnoid blood noted medially in the right parietal lobe seen on axial image 25 and sagittal image 32-33. No mass effect or midline shift. No intraparenchymal hemorrhage. Old right basal ganglia lacunar infarcts. Old right posterior parietal infarct  underlying craniotomy defect. There is atrophy and chronic small vessel disease changes. Vascular: No hyperdense vessel or unexpected calcification. Skull: No acute calvarial abnormality. Prior right parietal craniotomy. Sinuses/Orbits: No acute finding Other: None CT CERVICAL SPINE FINDINGS Alignment: No subluxation Skull base and vertebrae: No acute fracture. No primary bone lesion or focal pathologic process. Soft tissues and spinal canal: No prevertebral fluid or swelling. No visible canal hematoma. Disc levels: Fusion across the C2-3 disc space. Degenerative disc and facet disease throughout the cervical spine. Upper chest: Calcified granulomas in the upper lobes bilaterally. No acute findings Other: Carotid artery calcifications. IMPRESSION:  Subdural blood along the falx and between the frontal lobes, likely subdural hematoma. Small amount of subarachnoid blood noted in the medial right parietal lobe. No midline shift. Atrophy, chronic small vessel disease. Old right basal ganglia lacunar infarcts. No acute bony abnormality in the cervical spine. Critical Value/emergent results were called by telephone at the time of interpretation on 08/08/2018 at 3:49 am to [facility] , who verbally acknowledged these results. Electronically Signed   By: Rolm Baptise M.D.   On: 08/08/2018 03:50    Review of Systems  Neurological: Positive for headaches.   Blood pressure (!) 162/76, pulse 73, temperature 98.1 F (36.7 C), temperature source Oral, resp. rate 16, height 5\' 2"  (1.575 m), weight 72.6 kg, SpO2 97 %. Physical Exam  Constitutional: She is oriented to person, place, and time. She appears well-developed and well-nourished.  HENT:  Head: Normocephalic.  Eyes: Pupils are equal, round, and reactive to light.  Neck: Normal range of motion.  Respiratory: Effort normal.  GI: Soft. Bowel sounds are normal.  Neurological: She is alert and oriented to person, place, and time. She has normal strength. GCS eye  subscore is 4. GCS verbal subscore is 5. GCS motor subscore is 6.  Awake alert oriented x3 pupils are equal extra movements are intact strength is 5-5 upper and lower extremities    Assessment/Plan: 85 year old female with small parafalcine subdural and traumatic subarachnoid hemorrhage.  Patient is admitted to medicine for stabilization patient will need a repeat CT scan at least 12 hours from the first and if CT stable patient could be discharged back to facility tomorrow.  Patient should not be on any blood thinners or Lovenox.  Yuktha Kerchner P 08/08/2018, 9:28 AM

## 2018-08-09 DIAGNOSIS — Y92129 Unspecified place in nursing home as the place of occurrence of the external cause: Secondary | ICD-10-CM | POA: Diagnosis not present

## 2018-08-09 DIAGNOSIS — R1310 Dysphagia, unspecified: Secondary | ICD-10-CM | POA: Diagnosis not present

## 2018-08-09 DIAGNOSIS — N184 Chronic kidney disease, stage 4 (severe): Secondary | ICD-10-CM | POA: Diagnosis not present

## 2018-08-09 DIAGNOSIS — Z20828 Contact with and (suspected) exposure to other viral communicable diseases: Secondary | ICD-10-CM | POA: Diagnosis present

## 2018-08-09 DIAGNOSIS — G40909 Epilepsy, unspecified, not intractable, without status epilepticus: Secondary | ICD-10-CM | POA: Diagnosis present

## 2018-08-09 DIAGNOSIS — Z79891 Long term (current) use of opiate analgesic: Secondary | ICD-10-CM | POA: Diagnosis not present

## 2018-08-09 DIAGNOSIS — S0101XA Laceration without foreign body of scalp, initial encounter: Secondary | ICD-10-CM | POA: Diagnosis not present

## 2018-08-09 DIAGNOSIS — E782 Mixed hyperlipidemia: Secondary | ICD-10-CM | POA: Diagnosis present

## 2018-08-09 DIAGNOSIS — Z905 Acquired absence of kidney: Secondary | ICD-10-CM | POA: Diagnosis not present

## 2018-08-09 DIAGNOSIS — R296 Repeated falls: Secondary | ICD-10-CM | POA: Diagnosis present

## 2018-08-09 DIAGNOSIS — S065X9A Traumatic subdural hemorrhage with loss of consciousness of unspecified duration, initial encounter: Secondary | ICD-10-CM | POA: Diagnosis not present

## 2018-08-09 DIAGNOSIS — M6281 Muscle weakness (generalized): Secondary | ICD-10-CM | POA: Diagnosis not present

## 2018-08-09 DIAGNOSIS — Z880 Allergy status to penicillin: Secondary | ICD-10-CM | POA: Diagnosis not present

## 2018-08-09 DIAGNOSIS — W19XXXA Unspecified fall, initial encounter: Secondary | ICD-10-CM | POA: Diagnosis not present

## 2018-08-09 DIAGNOSIS — W010XXA Fall on same level from slipping, tripping and stumbling without subsequent striking against object, initial encounter: Secondary | ICD-10-CM | POA: Diagnosis present

## 2018-08-09 DIAGNOSIS — I6200 Nontraumatic subdural hemorrhage, unspecified: Secondary | ICD-10-CM | POA: Diagnosis not present

## 2018-08-09 DIAGNOSIS — M2550 Pain in unspecified joint: Secondary | ICD-10-CM | POA: Diagnosis not present

## 2018-08-09 DIAGNOSIS — G894 Chronic pain syndrome: Secondary | ICD-10-CM | POA: Diagnosis present

## 2018-08-09 DIAGNOSIS — E669 Obesity, unspecified: Secondary | ICD-10-CM | POA: Diagnosis present

## 2018-08-09 DIAGNOSIS — Z79899 Other long term (current) drug therapy: Secondary | ICD-10-CM | POA: Diagnosis not present

## 2018-08-09 DIAGNOSIS — I2510 Atherosclerotic heart disease of native coronary artery without angina pectoris: Secondary | ICD-10-CM | POA: Diagnosis present

## 2018-08-09 DIAGNOSIS — Z951 Presence of aortocoronary bypass graft: Secondary | ICD-10-CM | POA: Diagnosis not present

## 2018-08-09 DIAGNOSIS — S065X0D Traumatic subdural hemorrhage without loss of consciousness, subsequent encounter: Secondary | ICD-10-CM | POA: Diagnosis not present

## 2018-08-09 DIAGNOSIS — Z91048 Other nonmedicinal substance allergy status: Secondary | ICD-10-CM | POA: Diagnosis not present

## 2018-08-09 DIAGNOSIS — I609 Nontraumatic subarachnoid hemorrhage, unspecified: Secondary | ICD-10-CM | POA: Diagnosis present

## 2018-08-09 DIAGNOSIS — S066X0A Traumatic subarachnoid hemorrhage without loss of consciousness, initial encounter: Secondary | ICD-10-CM | POA: Diagnosis not present

## 2018-08-09 DIAGNOSIS — I10 Essential (primary) hypertension: Secondary | ICD-10-CM | POA: Diagnosis not present

## 2018-08-09 DIAGNOSIS — Z6829 Body mass index (BMI) 29.0-29.9, adult: Secondary | ICD-10-CM | POA: Diagnosis not present

## 2018-08-09 DIAGNOSIS — Z7401 Bed confinement status: Secondary | ICD-10-CM | POA: Diagnosis not present

## 2018-08-09 DIAGNOSIS — Z66 Do not resuscitate: Secondary | ICD-10-CM | POA: Diagnosis present

## 2018-08-09 DIAGNOSIS — I129 Hypertensive chronic kidney disease with stage 1 through stage 4 chronic kidney disease, or unspecified chronic kidney disease: Secondary | ICD-10-CM | POA: Diagnosis present

## 2018-08-09 DIAGNOSIS — G9341 Metabolic encephalopathy: Secondary | ICD-10-CM | POA: Diagnosis present

## 2018-08-09 DIAGNOSIS — Z91040 Latex allergy status: Secondary | ICD-10-CM | POA: Diagnosis not present

## 2018-08-09 DIAGNOSIS — Z8551 Personal history of malignant neoplasm of bladder: Secondary | ICD-10-CM | POA: Diagnosis not present

## 2018-08-09 DIAGNOSIS — R2689 Other abnormalities of gait and mobility: Secondary | ICD-10-CM | POA: Diagnosis not present

## 2018-08-09 DIAGNOSIS — Z882 Allergy status to sulfonamides status: Secondary | ICD-10-CM | POA: Diagnosis not present

## 2018-08-09 DIAGNOSIS — S065X0A Traumatic subdural hemorrhage without loss of consciousness, initial encounter: Secondary | ICD-10-CM | POA: Diagnosis present

## 2018-08-09 LAB — BASIC METABOLIC PANEL
Anion gap: 10 (ref 5–15)
BUN: 28 mg/dL — ABNORMAL HIGH (ref 8–23)
CO2: 24 mmol/L (ref 22–32)
Calcium: 9 mg/dL (ref 8.9–10.3)
Chloride: 106 mmol/L (ref 98–111)
Creatinine, Ser: 2.77 mg/dL — ABNORMAL HIGH (ref 0.44–1.00)
GFR calc Af Amer: 17 mL/min — ABNORMAL LOW (ref >60)
GFR calc non Af Amer: 15 mL/min — ABNORMAL LOW (ref >60)
Glucose, Bld: 105 mg/dL — ABNORMAL HIGH (ref 70–99)
Potassium: 4.5 mmol/L (ref 3.5–5.1)
Sodium: 140 mmol/L (ref 135–145)

## 2018-08-09 LAB — CBC
HCT: 38 % (ref 36.0–46.0)
Hemoglobin: 11.9 g/dL — ABNORMAL LOW (ref 12.0–15.0)
MCH: 29.4 pg (ref 26.0–34.0)
MCHC: 31.3 g/dL (ref 30.0–36.0)
MCV: 93.8 fL (ref 80.0–100.0)
Platelets: 147 10*3/uL — ABNORMAL LOW (ref 150–400)
RBC: 4.05 MIL/uL (ref 3.87–5.11)
RDW: 14.4 % (ref 11.5–15.5)
WBC: 8.1 10*3/uL (ref 4.0–10.5)
nRBC: 0 % (ref 0.0–0.2)

## 2018-08-09 MED ORDER — HYDROCODONE-ACETAMINOPHEN 10-325 MG PO TABS
1.0000 | ORAL_TABLET | Freq: Four times a day (QID) | ORAL | Status: DC | PRN
  Administered 2018-08-09 – 2018-08-10 (×2): 1 via ORAL
  Filled 2018-08-09 (×2): qty 1

## 2018-08-09 MED ORDER — GABAPENTIN 300 MG PO CAPS: 300.0000 mg | ORAL_CAPSULE | Freq: Every day | ORAL | Status: DC

## 2018-08-09 NOTE — NC FL2 (Signed)
[place] MEDICAID FL2 LEVEL OF CARE SCREENING TOOL     IDENTIFICATION  Patient Name: [name] [name] Birthdate: [birth year] Sex: female Admission Date (Current Location): 08/08/2018  [facility] and [place] Number:  Herbalist and Address:  [facility], [place] [address], [place], [place] [address]      Provider Number: [id number]  Attending Physician Name and Address:  Domenic Polite, MD  Relative Name and Phone Number:  Lavella Hammock, sister, [phone]    Current Level of Care: Hospital Recommended Level of Care: [place] Prior Approval Number:    Date Approved/Denied:   PASRR Number: [id number]  Discharge Plan: SNF    Current Diagnoses: Patient Active Problem List   Diagnosis Date Noted  . Traumatic subdural hematoma ([place]) 08/08/2018  . Pyelonephritis 09/09/2016  . Falls frequently 06/24/2016  . Tension headache 11/10/2013  . Chest pain 11/03/2011  . ARF (acute renal failure) ([place]) 04/30/2011  . UTI (urinary tract infection) 04/30/2011  . CKD (chronic kidney disease), stage IV ([place]) 04/30/2011  . Anemia 04/30/2011  . S/p nephrectomy 04/30/2011  . Macular degeneration 04/30/2011  . Cataract 04/30/2011  . Bladder cancer ([place]) 04/30/2011  . Weakness generalized 04/29/2011  . Seizure disorder ([place]) 04/29/2011  . Benign hypertension 04/29/2011  . CAD (coronary artery disease) 04/29/2011  . Hyperlipidemia 04/29/2011  . Anxiety 04/29/2011  . Dizziness - light-headed 04/29/2011  . Chronic abdominal pain 04/29/2011  . Mixed hyperlipidemia 05/24/2008  . Essential hypertension, benign 05/24/2008  . Postsurgical aortocoronary bypass status 05/24/2008    Orientation RESPIRATION BLADDER Height & Weight     Self, Time, Situation, Place  Normal Incontinent Weight: 160 lb (72.6 kg) Height:  5\' 2"  (157.5 cm)  BEHAVIORAL SYMPTOMS/MOOD NEUROLOGICAL BOWEL NUTRITION STATUS    Convulsions/Seizures(managed) Continent Diet(dysphagia  2 diet with thin liquids)  AMBULATORY STATUS COMMUNICATION OF NEEDS Skin   Extensive Assist Verbally Skin abrasions(laceration on head)                       Personal Care Assistance Level of Assistance  Bathing, Feeding, Dressing Bathing Assistance: Maximum assistance Feeding assistance: Independent Dressing Assistance: Maximum assistance     Functional Limitations Info  Sight, Speech, Hearing Sight Info: Adequate Hearing Info: Adequate Speech Info: Adequate    SPECIAL CARE FACTORS FREQUENCY  PT (By licensed PT), OT (By licensed OT)     PT Frequency: 5x week OT Frequency: 5x week            Contractures Contractures Info: Not present    Additional Factors Info  Code Status, Allergies, Psychotropic Code Status Info: DNR Allergies Info: ADHESIVE TAPE, KEFLET CEPHALEXIN, LATEX, PENICILLINS, SULFA ANTIBIOTICS  Psychotropic Info: levETIRAcetam (KEPPRA) tablet 250 mg 2x daily PO         Current Medications (08/09/2018):  This is the current hospital active medication list Current Facility-Administered Medications  Medication Dose Route Frequency Provider Last Rate Last Dose  . acetaminophen (TYLENOL) tablet 650 mg  650 mg Oral Q6H PRN Karmen Bongo, MD       Or  . acetaminophen (TYLENOL) suppository 650 mg  650 mg Rectal Q6H PRN Karmen Bongo, MD      . amLODipine (NORVASC) tablet 2.5 mg  2.5 mg Oral Daily Karmen Bongo, MD   2.5 mg at 08/09/18 1020  . docusate sodium (COLACE) capsule 100 mg  100 mg Oral BID Karmen Bongo, MD   100 mg at 08/09/18 1019  . [START ON  08/10/2018] gabapentin (NEURONTIN) capsule 300 mg  300 mg Oral QHS Domenic Polite, MD      . HYDROcodone-acetaminophen [facility]) 10-325 MG per tablet 1 tablet  1 tablet Oral Q6H PRN Domenic Polite, MD      . levETIRAcetam (KEPPRA) tablet 250 mg  250 mg Oral BID Karmen Bongo, MD   250 mg at 08/09/18 1019  . linaclotide (LINZESS) capsule 145 mcg  145 mcg Oral Ivery Quale, MD   145 mcg at  08/08/18 2255  . mirabegron ER (MYRBETRIQ) tablet 25 mg  25 mg Oral Ivery Quale, MD   25 mg at 08/08/18 2255  . ondansetron (ZOFRAN) tablet 4 mg  4 mg Oral Q6H PRN Karmen Bongo, MD       Or  . ondansetron [facility]) injection 4 mg  4 mg Intravenous Q6H PRN Karmen Bongo, MD      . polyethylene glycol (MIRALAX / GLYCOLAX) packet 17 g  17 g Oral Daily PRN Karmen Bongo, MD      . rosuvastatin (CRESTOR) tablet 20 mg  20 mg Oral Daily Karmen Bongo, MD   20 mg at 08/09/18 1019  . sodium chloride flush (NS) 0.9 % injection 3 mL  3 mL Intravenous Q12H Karmen Bongo, MD   3 mL at 08/09/18 1024     Discharge Medications: Please see discharge summary for a list of discharge medications.  Relevant Imaging Results:  Relevant Lab Results:   Additional Information [phone].   Alexander Mt, LCSWA

## 2018-08-09 NOTE — NC FL2 (Signed)
[place] MEDICAID FL2 LEVEL OF CARE SCREENING TOOL     IDENTIFICATION  Patient Name: [name] [name] Birthdate: 09/08/1928 Sex: female Admission Date (Current Location): 08/08/2018  [facility] and [place] Number:  Herbalist and Address:  The [facility], [place] [address] [place], [place] [address]      Provider Number: [id number]  Attending Physician Name and Address:  Domenic Polite, MD  Relative Name and Phone Number:  Lavella Hammock, sister, [phone]    Current Level of Care: Hospital Recommended Level of Care: [place] Prior Approval Number:    Date Approved/Denied:   PASRR Number:    Discharge Plan: Other (Comment)([facility])    Current Diagnoses: Patient Active Problem List   Diagnosis Date Noted  . Traumatic subdural hematoma ([place]) 08/08/2018  . Pyelonephritis 09/09/2016  . Falls frequently 06/24/2016  . Tension headache 11/10/2013  . Chest pain 11/03/2011  . ARF (acute renal failure) ([place]) 04/30/2011  . UTI (urinary tract infection) 04/30/2011  . CKD (chronic kidney disease), stage IV ([place]) 04/30/2011  . Anemia 04/30/2011  . S/p nephrectomy 04/30/2011  . Macular degeneration 04/30/2011  . Cataract 04/30/2011  . Bladder cancer ([place]) 04/30/2011  . Weakness generalized 04/29/2011  . Seizure disorder ([place]) 04/29/2011  . Benign hypertension 04/29/2011  . CAD (coronary artery disease) 04/29/2011  . Hyperlipidemia 04/29/2011  . Anxiety 04/29/2011  . Dizziness - light-headed 04/29/2011  . Chronic abdominal pain 04/29/2011  . Mixed hyperlipidemia 05/24/2008  . Essential hypertension, benign 05/24/2008  . Postsurgical aortocoronary bypass status 05/24/2008    Orientation RESPIRATION BLADDER Height & Weight     Self, Time, Situation, Place  Normal Incontinent Weight: 160 lb (72.6 kg) Height:  5\' 2"  (157.5 cm)  BEHAVIORAL SYMPTOMS/MOOD NEUROLOGICAL BOWEL NUTRITION STATUS      Continent Diet(dysphagia 2  diet with thin liquids)  AMBULATORY STATUS COMMUNICATION OF NEEDS Skin   Limited Assist Verbally Skin abrasions(laceration on head)                       Personal Care Assistance Level of Assistance  Bathing, Feeding, Dressing Bathing Assistance: Limited assistance Feeding assistance: Independent Dressing Assistance: Limited assistance     Functional Limitations Info  Sight, Speech, Hearing Sight Info: Adequate Hearing Info: Adequate Speech Info: Adequate    SPECIAL CARE FACTORS FREQUENCY                       Contractures Contractures Info: Not present    Additional Factors Info  Code Status, Allergies, Psychotropic Code Status Info: DNR Allergies Info: ADHESIVE TAPE, KEFLET CEPHALEXIN, LATEX, PENICILLINS, SULFA ANTIBIOTICS  Psychotropic Info: levETIRAcetam (KEPPRA) tablet 250 mg 2x daily PO         Current Medications (08/09/2018):  This is the current hospital active medication list Current Facility-Administered Medications  Medication Dose Route Frequency Provider Last Rate Last Dose  . acetaminophen (TYLENOL) tablet 650 mg  650 mg Oral Q6H PRN Karmen Bongo, MD       Or  . acetaminophen (TYLENOL) suppository 650 mg  650 mg Rectal Q6H PRN Karmen Bongo, MD      . amLODipine (NORVASC) tablet 2.5 mg  2.5 mg Oral Daily Karmen Bongo, MD   2.5 mg at 08/09/18 1020  . docusate sodium (COLACE) capsule 100 mg  100 mg Oral BID Karmen Bongo, MD   100 mg at 08/09/18 1019  . gabapentin (NEURONTIN) capsule 300 mg  300 mg Oral BID Karmen Bongo, MD   300 mg at 08/09/18 1019  . HYDROcodone-acetaminophen (NORCO) 10-325 MG per tablet 1 tablet  1 tablet Oral Q6H PRN Domenic Polite, MD      . levETIRAcetam (KEPPRA) tablet 250 mg  250 mg Oral BID Karmen Bongo, MD   250 mg at 08/09/18 1019  . linaclotide (LINZESS) capsule 145 mcg  145 mcg Oral Ivery Quale, MD   145 mcg at 08/08/18 2255  . mirabegron ER (MYRBETRIQ) tablet 25 mg  25 mg Oral Ivery Quale, MD   25 mg at 08/08/18 2255  . ondansetron (ZOFRAN) tablet 4 mg  4 mg Oral Q6H PRN Karmen Bongo, MD       Or  . ondansetron [facility]) injection 4 mg  4 mg Intravenous Q6H PRN Karmen Bongo, MD      . polyethylene glycol (MIRALAX / GLYCOLAX) packet 17 g  17 g Oral Daily PRN Karmen Bongo, MD      . rosuvastatin (CRESTOR) tablet 20 mg  20 mg Oral Daily Karmen Bongo, MD   20 mg at 08/09/18 1019  . sodium chloride flush (NS) 0.9 % injection 3 mL  3 mL Intravenous Q12H Karmen Bongo, MD   3 mL at 08/09/18 1024     Discharge Medications: Please see discharge summary for a list of discharge medications.  Relevant Imaging Results:  Relevant Lab Results:   Additional Information [phone].   Alexander Mt, LCSWA

## 2018-08-09 NOTE — Social Work (Signed)
Pt is under observation- if ready to d/c back to ALF please complete d/c summary and any prescriptions for controlled substances.  Westley Hummer, MSW, [place] Work [phone]

## 2018-08-09 NOTE — Progress Notes (Addendum)
PROGRESS NOTE    [name] TALLIE  [name]:[id number] DOB: March 14, 1929 DOA: 08/08/2018 PCP: Celene Squibb, MD  Brief Narrative: [name] [name] is a 85 y.o. female with medical history significant of seizure; HLD; HTN; stage 4 CKD; CAD; and bladder cancer presenting after a fall.   Patient was in her facility and was reaching for a remote control without use of her walker when she fell and hit her head. -Head CT noted small subdural hematoma and subarachnoid hemorrhage -Neurosurgery was consulted, nonsurgical management recommended, repeat CT head was stable   Assessment & Plan:   Traumatic subdural hematoma -Patient lives in an assisted living facility and had a mechanical fall  -occipital laceration was repaired with staples but also was found to have a small subdural/subarachnoid hematoma -Repeat CT head was stable -Neurosurgery consulting, nonsurgical management recommended -PT OT eval  Metabolic encephalopathy  -Patient is lethargic, drowsy, unable to ambulate safely -Suspect this is multifactorial, secondary to above and home medications-namely high-dose gabapentin with decreased renal clearance and narcotics -Changed Vicodin to as needed -Decrease gabapentin, she has CKD stage IV and hence should not be on 600 mg of gabapentin daily, this was changed to 300 nightly -Afebrile and nontoxic  Seizure disorder -Continue Keppra -Stable  HTN -Continue Norvasc  Stage IV CKD -Baseline creatinine around 2, from 2 years ago  -Currently stable at 2.7 which must be her current baseline -Renal dosing of medications  Chronic pain -See discussion above, changed Vicodin to as needed and decrease gabapentin dosing due to lethargy  DVT prophylaxis:  SCDs Code Status:  DNR  Family Communication: None present; attempted to call son Collier [place], no voicemail so could not leave a message  Consultants:   NSG   Procedures:   Antimicrobials:    Subjective: -Sleepy and tired, reports  not getting enough sleep last night  Objective: Vitals:   08/09/18 0038 08/09/18 0434 08/09/18 0750 08/09/18 1311  BP: (!) 107/59 (!) 104/50 (!) 139/51 (!) 143/77  Pulse: 69 64 72 72  Resp: 12 12 20 16   Temp: 98.4 F (36.9 C) 97.9 F (36.6 C) 98.4 F (36.9 C) 98.8 F (37.1 C)  TempSrc: Oral Oral Oral Oral  SpO2: 92% 96% 96% 94%  Weight:      Height:        Intake/Output Summary (Last 24 hours) at 08/09/2018 1324 Last data filed at 08/08/2018 1844 Gross per 24 hour  Intake 360 ml  Output 650 ml  Net -290 ml   Filed Weights   08/08/18 0301  Weight: 72.6 kg    Examination:  General exam: Frail elderly chronically ill-appearing female somnolent easily arousable but falls back to sleep, unable to assess orientation Respiratory system: Clear to auscultation. Cardiovascular system: S1 & S2 heard, RRR.  Gastrointestinal system: Abdomen is nondistended, soft and nontender.Normal bowel sounds heard. Central nervous system: Alert and oriented. No focal neurological deficits. Extremities: No edema Skin: No rashes, lesions or ulcers Psychiatry: Unable to assess   Data Reviewed:   CBC: Recent Labs  Lab 08/08/18 0505 08/09/18 0504  WBC 8.6 8.1  NEUTROABS 4.4  --   HGB 12.5 11.9*  HCT 39.7 38.0  MCV 94.5 93.8  PLT 160 811*   Basic Metabolic Panel: Recent Labs  Lab 08/08/18 0505 08/09/18 0504  NA 140 140  K 5.0 4.5  CL 103 106  CO2 23 24  GLUCOSE 106* 105*  BUN 29* 28*  CREATININE 2.77* 2.77*  CALCIUM 9.6 9.0   GFR:  Estimated Creatinine Clearance: 13.1 mL/min (A) (by C-G formula based on SCr of 2.77 mg/dL (H)). Liver Function Tests: No results for input(s): AST, ALT, ALKPHOS, BILITOT, PROT, ALBUMIN in the last 168 hours. No results for input(s): LIPASE, AMYLASE in the last 168 hours. No results for input(s): AMMONIA in the last 168 hours. Coagulation Profile: Recent Labs  Lab 08/08/18 0505  INR 1.1   Cardiac Enzymes: No results for input(s): CKTOTAL,  CKMB, CKMBINDEX, TROPONINI in the last 168 hours. BNP (last 3 results) No results for input(s): PROBNP in the last 8760 hours. HbA1C: No results for input(s): HGBA1C in the last 72 hours. CBG: No results for input(s): GLUCAP in the last 168 hours. Lipid Profile: No results for input(s): CHOL, HDL, LDLCALC, TRIG, CHOLHDL, LDLDIRECT in the last 72 hours. Thyroid Function Tests: No results for input(s): TSH, T4TOTAL, FREET4, T3FREE, THYROIDAB in the last 72 hours. Anemia Panel: No results for input(s): VITAMINB12, FOLATE, FERRITIN, TIBC, IRON, RETICCTPCT in the last 72 hours. Urine analysis:    Component Value Date/Time   COLORURINE YELLOW 12/04/2016 [facility] 12/04/2016 1235   LABSPEC <1.005 (L) 12/04/2016 1235   PHURINE 7.0 12/04/2016 1235   GLUCOSEU NEGATIVE 12/04/2016 1235   HGBUR MODERATE (A) 12/04/2016 1235   BILIRUBINUR NEGATIVE 12/04/2016 1235   KETONESUR NEGATIVE 12/04/2016 1235   PROTEINUR 30 (A) 12/04/2016 1235   UROBILINOGEN 0.2 06/11/2013 2102   NITRITE NEGATIVE 12/04/2016 1235   LEUKOCYTESUR LARGE (A) 12/04/2016 1235   Sepsis Labs: @LABRCNTIP (procalcitonin:4,lacticidven:4)  ) Recent Results (from the past 240 hour(s))  SARS Coronavirus 2 [facility] order, Performed in [place] hospital lab)     Status: None   Collection Time: 08/08/18  3:57 AM  Result Value Ref Range Status   SARS Coronavirus 2 NEGATIVE NEGATIVE Final    Comment: (NOTE) If result is NEGATIVE SARS-CoV-2 target nucleic acids are NOT DETECTED. The SARS-CoV-2 RNA is generally detectable in upper and lower  respiratory specimens during the acute phase of infection. The lowest  concentration of SARS-CoV-2 viral copies this assay can detect is 250  copies / mL. A negative result does not preclude SARS-CoV-2 infection  and should not be used as the sole basis for treatment or other  patient management decisions.  A negative result may occur with  improper specimen collection /  handling, submission of specimen other  than nasopharyngeal swab, presence of viral mutation(s) within the  areas targeted by this assay, and inadequate number of viral copies  (<250 copies / mL). A negative result must be combined with clinical  observations, patient history, and epidemiological information. If result is POSITIVE SARS-CoV-2 target nucleic acids are DETECTED. The SARS-CoV-2 RNA is generally detectable in upper and lower  respiratory specimens dur ing the acute phase of infection.  Positive  results are indicative of active infection with SARS-CoV-2.  Clinical  correlation with patient history and other diagnostic information is  necessary to determine patient infection status.  Positive results do  not rule out bacterial infection or co-infection with other viruses. If result is PRESUMPTIVE POSTIVE SARS-CoV-2 nucleic acids MAY BE PRESENT.   A presumptive positive result was obtained on the submitted specimen  and confirmed on repeat testing.  While 2019 novel coronavirus  (SARS-CoV-2) nucleic acids may be present in the submitted sample  additional confirmatory testing may be necessary for epidemiological  and / or clinical management purposes  to differentiate between  SARS-CoV-2 and other Sarbecovirus currently known to infect humans.  If  clinically indicated additional testing with an alternate test  methodology [phone]) is advised. The SARS-CoV-2 RNA is generally  detectable in upper and lower respiratory sp ecimens during the acute  phase of infection. The expected result is Negative. Fact Sheet for Patients:  StrictlyIdeas.no Fact Sheet for Healthcare Providers: BankingDealers.co.za This test is not yet approved or cleared by the [place] FDA and has been authorized for detection and/or diagnosis of SARS-CoV-2 by FDA under an Emergency Use Authorization (EUA).  This EUA will remain in effect (meaning this  test can be used) for the duration of the COVID-19 declaration under Section 564(b)(1) of the Act, 21 U.S.C. section 360bbb-3(b)(1), unless the authorization is terminated or revoked sooner. Performed at [facility], [place] [address] [place], [place] [address]          Radiology Studies: Ct Head Wo Contrast  Result Date: 08/08/2018 CLINICAL DATA:  Status post fall, low back pain EXAM: CT HEAD WITHOUT CONTRAST TECHNIQUE: Contiguous axial images were obtained from the base of the skull through the vertex without intravenous contrast. COMPARISON:  08/08/2018 FINDINGS: Brain: No evidence of acute infarction, extra-axial collection, ventriculomegaly, or mass effect. Small amount of subdural blood along the interhemispheric falx and small volume subarachnoid hemorrhage in the medial right parietal lobe. No midline shift. Old right basal ganglia lacunar infarct. Generalized cerebral atrophy. Periventricular white matter low attenuation likely secondary to microangiopathy. Vascular: Cerebrovascular atherosclerotic calcifications are noted. Skull: Prior right craniotomy.  No acute osseous abnormality. Sinuses/Orbits: Visualized portions of the orbits are unremarkable. Visualized portions of the paranasal sinuses and mastoid air cells are unremarkable. Other: None. IMPRESSION: 1. Persistent small amount of subdural hemorrhage along the interhemispheric falx. Small volume subarachnoid hemorrhage in the medial right parietal lobe. No significant interval change compared with the prior exam. Electronically Signed   By: Kathreen Devoid   On: 08/08/2018 12:36   Ct Head Wo Contrast  Result Date: 08/08/2018 CLINICAL DATA:  Fall. EXAM: CT HEAD WITHOUT CONTRAST CT CERVICAL SPINE WITHOUT CONTRAST TECHNIQUE: Multidetector CT imaging of the head and cervical spine was performed following the standard protocol without intravenous contrast. Multiplanar CT image reconstructions of the cervical spine were also generated.  COMPARISON:  04/04/2016 FINDINGS: CT HEAD FINDINGS Brain: There is interhemispheric hyperdense blood noted between the frontal lobes compatible with small subdural hematoma. Blood is also noted along the right side of the falx, best seen on coronal imaging. There is also subarachnoid blood noted medially in the right parietal lobe seen on axial image 25 and sagittal image 32-33. No mass effect or midline shift. No intraparenchymal hemorrhage. Old right basal ganglia lacunar infarcts. Old right posterior parietal infarct underlying craniotomy defect. There is atrophy and chronic small vessel disease changes. Vascular: No hyperdense vessel or unexpected calcification. Skull: No acute calvarial abnormality. Prior right parietal craniotomy. Sinuses/Orbits: No acute finding Other: None CT CERVICAL SPINE FINDINGS Alignment: No subluxation Skull base and vertebrae: No acute fracture. No primary bone lesion or focal pathologic process. Soft tissues and spinal canal: No prevertebral fluid or swelling. No visible canal hematoma. Disc levels: Fusion across the C2-3 disc space. Degenerative disc and facet disease throughout the cervical spine. Upper chest: Calcified granulomas in the upper lobes bilaterally. No acute findings Other: Carotid artery calcifications. IMPRESSION: Subdural blood along the falx and between the frontal lobes, likely subdural hematoma. Small amount of subarachnoid blood noted in the medial right parietal lobe. No midline shift. Atrophy, chronic small vessel disease. Old right basal ganglia lacunar  infarcts. No acute bony abnormality in the cervical spine. Critical Value/emergent results were called by telephone at the time of interpretation on 08/08/2018 at 3:49 am to [facility] , who verbally acknowledged these results. Electronically Signed   By: Rolm Baptise M.D.   On: 08/08/2018 03:50   Ct Cervical Spine Wo Contrast  Result Date: 08/08/2018 CLINICAL DATA:  Fall. EXAM: CT HEAD WITHOUT CONTRAST CT  CERVICAL SPINE WITHOUT CONTRAST TECHNIQUE: Multidetector CT imaging of the head and cervical spine was performed following the standard protocol without intravenous contrast. Multiplanar CT image reconstructions of the cervical spine were also generated. COMPARISON:  04/04/2016 FINDINGS: CT HEAD FINDINGS Brain: There is interhemispheric hyperdense blood noted between the frontal lobes compatible with small subdural hematoma. Blood is also noted along the right side of the falx, best seen on coronal imaging. There is also subarachnoid blood noted medially in the right parietal lobe seen on axial image 25 and sagittal image 32-33. No mass effect or midline shift. No intraparenchymal hemorrhage. Old right basal ganglia lacunar infarcts. Old right posterior parietal infarct underlying craniotomy defect. There is atrophy and chronic small vessel disease changes. Vascular: No hyperdense vessel or unexpected calcification. Skull: No acute calvarial abnormality. Prior right parietal craniotomy. Sinuses/Orbits: No acute finding Other: None CT CERVICAL SPINE FINDINGS Alignment: No subluxation Skull base and vertebrae: No acute fracture. No primary bone lesion or focal pathologic process. Soft tissues and spinal canal: No prevertebral fluid or swelling. No visible canal hematoma. Disc levels: Fusion across the C2-3 disc space. Degenerative disc and facet disease throughout the cervical spine. Upper chest: Calcified granulomas in the upper lobes bilaterally. No acute findings Other: Carotid artery calcifications. IMPRESSION: Subdural blood along the falx and between the frontal lobes, likely subdural hematoma. Small amount of subarachnoid blood noted in the medial right parietal lobe. No midline shift. Atrophy, chronic small vessel disease. Old right basal ganglia lacunar infarcts. No acute bony abnormality in the cervical spine. Critical Value/emergent results were called by telephone at the time of interpretation on 08/08/2018  at 3:49 am to [facility] , who verbally acknowledged these results. Electronically Signed   By: Rolm Baptise M.D.   On: 08/08/2018 03:50        Scheduled Meds:  amLODipine  2.5 mg Oral Daily   docusate sodium  100 mg Oral BID   gabapentin  300 mg Oral BID   levETIRAcetam  250 mg Oral BID   linaclotide  145 mcg Oral QHS   mirabegron ER  25 mg Oral QHS   rosuvastatin  20 mg Oral Daily   sodium chloride flush  3 mL Intravenous Q12H   Continuous Infusions:   LOS: 0 days    Time spent: 46min    Domenic Polite, MD [facility]  08/09/2018, 1:24 PM

## 2018-08-09 NOTE — Evaluation (Signed)
Occupational Therapy Evaluation Patient Details Name: [name] [name] MRN: [id number] DOB: 11-12-1928 Today's Date: 08/09/2018    History of Present Illness [name] [name] is a 85 y.o. female with medical history significant of seizure; HLD; HTN; stage 4 CKD; CAD; and bladder cancer presenting after a posterior fall at her ALF. Pt sustained small parafalcine subdural and traumatic subarachnoid hemorrhage   Clinical Impression   PT admitted with s/p fall at ALF. Pt currently with functional limitiations due to the deficits listed below (see OT problem list). Pt currently requires mod (A) for sit<>stand and visual deficits. Pt will require higher level care than ALF can provide. Pt is agreeable to placement near sister [place]. [facility] notified of patients request.   Pt will benefit from skilled OT to increase their independence and safety with adls and balance to allow discharge SNF.     Follow Up Recommendations  SNF    Equipment Recommendations  Wheelchair (measurements OT);Wheelchair cushion (measurements OT)    Recommendations for Other Services       Precautions / Restrictions Precautions Precautions: Fall Restrictions Weight Bearing Restrictions: No      Mobility Bed Mobility Overal bed mobility: Needs Assistance Bed Mobility: Supine to Sit     Supine to sit: Mod assist     General bed mobility comments: in chair on arrival  Transfers Overall transfer level: Needs assistance Equipment used: Rolling walker (2 wheeled) Transfers: Sit to/from [facility] Sit to Stand: Max assist Stand pivot transfers: Mod assist       General transfer comment: unable to complete sit<>Stand x2 then allowing therapist to (A)     Balance Overall balance assessment: Needs assistance;History of Falls Sitting-balance support: Single extremity supported Sitting balance-Leahy Scale: Poor Sitting balance - Comments: posterior lean, needed mod A to maintain sitting initially,  progressed to min-guard A Postural control: Posterior lean Standing balance support: Bilateral upper extremity supported Standing balance-Leahy Scale: Poor Standing balance comment: heavy use of UE support                           ADL either performed or assessed with clinical judgement   ADL Overall ADL's : Needs assistance/impaired Eating/Feeding: Set up   Grooming: Set up;Sitting   Upper Body Bathing: Moderate assistance   Lower Body Bathing: Maximal assistance   Upper Body Dressing : Moderate assistance   Lower Body Dressing: Maximal assistance   Toilet Transfer: Maximal assistance   Toileting- Clothing Manipulation and Hygiene: Maximal assistance         General ADL Comments: pt eating lunch on arrival with (A) due to visual deficits. pt lacks problem solving initally states she can go home and could get up. after attempting to exit the chair and unable to reports "i think i am going to need some help first"     Vision Baseline Vision/History: Wears glasses;Macular Degeneration Wears Glasses: At all times Vision Assessment?: Vision impaired- to be further tested in functional context Additional Comments: reports vision is worse than normal.pt reports therapist is wearing glasses and therapist is not. pt requires objects within inches to see object and no glasses present at this time.       Perception     Praxis      Pertinent Vitals/Pain Pain Assessment: 0-10 Pain Score: 9  Pain Location: R side going up to my arm and my hip Pain Descriptors / Indicators: Sharp Pain Intervention(s): Monitored during session;Premedicated before session;Repositioned  Hand Dominance Right   Extremity/Trunk Assessment Upper Extremity Assessment Upper Extremity Assessment: RUE deficits/detail;LUE deficits/detail RUE Deficits / Details: tremor at baseline pain starting at 90 degrees . pt with neck flexion attempting to touch the top of her head LUE Deficits /  Details: AROM 100 degrees able to touch the back of her head   Lower Extremity Assessment Lower Extremity Assessment: Defer to PT evaluation   Cervical / Trunk Assessment Cervical / Trunk Assessment: Kyphotic   Communication Communication Communication: No difficulties   Cognition Arousal/Alertness: Lethargic;Awake/alert Behavior During Therapy: Flat affect Overall Cognitive Status: Impaired/Different from baseline Area of Impairment: Following commands;Problem solving;Attention;Memory                   Current Attention Level: Sustained Memory: Decreased short-term memory Following Commands: Follows one step commands consistently;Follows one step commands with increased time     Problem Solving: Slow processing;Requires verbal cues;Requires tactile cues;Decreased initiation;Difficulty sequencing General Comments: pt reports changes in vision and today as Friday may 11th. pt shocked to read calendar with min cues to be Monday May th   General Comments       Exercises     Shoulder Instructions      Home Living Family/patient expects to be discharged to:: Assisted living                             Home Equipment: Walker - 4 wheels   Additional Comments: pt lives [facility] ALF, can get wheeled to dining room in w/c but has to be able to get up OOB herself      Prior Functioning/Environment Level of Independence: Needs assistance  Gait / Transfers Assistance Needed: recently has been pushed to dining room more than walking with rollator ADL's / Homemaking Assistance Needed: bathing and dressing complete independent            OT Problem List: Decreased strength;Decreased activity tolerance;Impaired balance (sitting and/or standing);Decreased cognition;Decreased safety awareness;Decreased knowledge of use of DME or AE;Decreased knowledge of precautions;Pain;Impaired UE functional use;Obesity;Impaired vision/perception;Decreased range of motion       OT Treatment/Interventions: Self-care/ADL training;Therapeutic exercise;Neuromuscular education;Energy conservation;DME and/or AE instruction;Manual therapy;Therapeutic activities    OT Goals(Current goals can be found in the care plan section) Acute Rehab OT Goals Patient Stated Goal: to go to rehab near my sister carole [phone] OT Goal Formulation: With patient Time For Goal Achievement: 08/23/18 Potential to Achieve Goals: Good  OT Frequency: Min 2X/week   Barriers to D/C:            Co-evaluation              AM-PAC OT "6 Clicks" Daily Activity     Outcome Measure Help from another person eating meals?: A Lot Help from another person taking care of personal grooming?: A Lot Help from another person toileting, which includes using toliet, bedpan, or urinal?: A Lot Help from another person bathing (including washing, rinsing, drying)?: A Lot Help from another person to put on and taking off regular upper body clothing?: A Lot Help from another person to put on and taking off regular lower body clothing?: Total 6 Click Score: 11   End of Session Equipment Utilized During Treatment: Rolling walker Nurse Communication: Mobility status;Precautions  Activity Tolerance: Patient limited by pain Patient left: in chair;with call bell/phone within reach  OT Visit Diagnosis: Unsteadiness on feet (R26.81);Muscle weakness (generalized) (M62.81)  Time: 9323-5573 OT Time Calculation (min): 20 min Charges:  OT General Charges $OT Visit: 1 Visit OT Evaluation $OT Eval Moderate Complexity: 1 Mod   Jeri Modena, OTR/L  Acute Rehabilitation Services Pager: [phone] Office: [phone] .   Jeri Modena 08/09/2018, 1:44 PM

## 2018-08-09 NOTE — Evaluation (Signed)
Physical Therapy Evaluation Patient Details Name: [name] [name] MRN: [id number] DOB: 08-04-1928 Today's Date: 08/09/2018   History of Present Illness  [name] [name] is a 85 y.o. female with medical history significant of seizure; HLD; HTN; stage 4 CKD; CAD; and bladder cancer presenting after a posterior fall at her ALF. Pt sustained small parafalcine subdural and traumatic subarachnoid hemorrhage  Clinical Impression  Pt admitted with above diagnosis. Pt currently with functional limitations due to the deficits listed below (see PT Problem List). Pt lethargic on evaluation, unclear whether this is due to meds or head injury. Pt known to this therapist from prior admission and her current functional level today is greatly altered from that admission. She required mod A for bed mobility and max A to stand from the bed due to strong posterior lean. She was unable to use the RW to pivot to the chair. Was able to take small steps with therapist directly in front of pt giving support and no RW. Unable to attempt ambulation today due to decreased safety. Do not feel that at this level she can return to [facility]. Recommending SNF.  Pt will benefit from skilled PT to increase their independence and safety with mobility to allow discharge to the venue listed below.       Follow Up Recommendations SNF;Supervision/Assistance - 24 hour    Equipment Recommendations  None recommended by PT    Recommendations for Other Services OT consult     Precautions / Restrictions Precautions Precautions: Fall Restrictions Weight Bearing Restrictions: No      Mobility  Bed Mobility Overal bed mobility: Needs Assistance Bed Mobility: Supine to Sit     Supine to sit: Mod assist     General bed mobility comments: hand over hand initiation of rolling L. Mod A to complete roll and slide LE's off bed and elevate trunk. Once up, pt with initial posterior lean  Transfers Overall transfer level: Needs  assistance Equipment used: Rolling walker (2 wheeled) Transfers: Sit to/from [facility] Sit to Stand: Max assist;Mod assist Stand pivot transfers: Mod assist       General transfer comment: pt first stood to RW, needed max A for power up and maintained posterior lean against bed. Unable to step feet to recliner. RW removed and therapist stood directly in front of pt, pt able to stand with mod A and take small steps to chair with mod A for support and vc's for each step  Ambulation/Gait             General Gait Details: unable to perform safely today  Stairs            Wheelchair Mobility    Modified Rankin (Stroke Patients Only) Modified Rankin (Stroke Patients Only) Pre-Morbid Rankin Score: Moderate disability Modified Rankin: Severe disability     Balance Overall balance assessment: Needs assistance;History of Falls Sitting-balance support: Single extremity supported Sitting balance-Leahy Scale: Poor Sitting balance - Comments: posterior lean, needed mod A to maintain sitting initially, progressed to min-guard A Postural control: Posterior lean Standing balance support: Bilateral upper extremity supported Standing balance-Leahy Scale: Poor Standing balance comment: heavy use of UE support                             Pertinent Vitals/Pain Pain Assessment: No/denies pain    Home Living Family/patient expects to be discharged to:: Assisted living  Home Equipment: [place] - 4 wheels Additional Comments: pt lives [facility], can get wheeled to dining room in w/c but has to be able to get up OOB herself    Prior Function Level of Independence: Needs assistance   Gait / Transfers Assistance Needed: recently has been pushed to dining room more than walking with rollator           Hand Dominance        Extremity/Trunk Assessment   Upper Extremity Assessment Upper Extremity Assessment: Defer to OT  evaluation    Lower Extremity Assessment Lower Extremity Assessment: Generalized weakness(3-/5)    Cervical / Trunk Assessment Cervical / Trunk Assessment: Kyphotic  Communication   Communication: No difficulties  Cognition Arousal/Alertness: Lethargic Behavior During Therapy: Flat affect Overall Cognitive Status: Impaired/Different from baseline Area of Impairment: Following commands;Problem solving;Attention                   Current Attention Level: Focused   Following Commands: Follows one step commands consistently;Follows one step commands with increased time     Problem Solving: Slow processing;Requires verbal cues;Requires tactile cues;Decreased initiation;Difficulty sequencing General Comments: very lethargic today, question meds vs head trauma as cause? Needed tactile and verbal cues with all commands      General Comments      Exercises     Assessment/Plan    PT Assessment Patient needs continued PT services  PT Problem List Decreased strength;Decreased activity tolerance;Decreased balance;Decreased mobility;Decreased coordination;Decreased cognition;Decreased knowledge of use of DME;Decreased safety awareness;Decreased knowledge of precautions       PT Treatment Interventions DME instruction;Gait training;Functional mobility training;Therapeutic activities;Therapeutic exercise;Balance training;Neuromuscular re-education;Cognitive remediation;Patient/family education    PT Goals (Current goals can be found in the Care Plan section)  Acute Rehab PT Goals Patient Stated Goal: get better PT Goal Formulation: With patient Time For Goal Achievement: 08/23/18 Potential to Achieve Goals: Fair    Frequency Min 3X/week   Barriers to discharge Decreased caregiver support      Co-evaluation               AM-PAC PT "6 Clicks" Mobility  Outcome Measure Help needed turning from your back to your side while in a flat bed without using bedrails?: A  Lot Help needed moving from lying on your back to sitting on the side of a flat bed without using bedrails?: A Lot Help needed moving to and from a bed to a chair (including a wheelchair)?: A Lot Help needed standing up from a chair using your arms (e.g., wheelchair or bedside chair)?: A Lot Help needed to walk in hospital room?: Total Help needed climbing 3-5 steps with a railing? : Total 6 Click Score: 10    End of Session Equipment Utilized During Treatment: Gait belt Activity Tolerance: Patient limited by lethargy Patient left: in chair;with call bell/phone within reach;with chair alarm set Nurse Communication: Mobility status PT Visit Diagnosis: Unsteadiness on feet (R26.81);Muscle weakness (generalized) (M62.81);Difficulty in walking, not elsewhere classified (R26.2)    Time: 1203-1226 PT Time Calculation (min) (ACUTE ONLY): 23 min   Charges:   PT Evaluation $PT Eval Moderate Complexity: 1 Mod PT Treatments $Neuromuscular Re-education: 8-22 mins        Leighton Roach, PT  Acute Rehab Services  Pager [phone] Office [place] 08/09/2018, 1:05 PM

## 2018-08-09 NOTE — TOC Initial Note (Addendum)
Transition of Care [facility]) - Initial/Assessment Note    Patient Details  Name: [name] [name] MRN: [id number] Date of Birth: [birth year]  [facility]) CM/SW Contact:    Alexander Mt, [place] Phone Number: 08/09/2018, 2:57 PM  Clinical Narrative:       4:03pm- Pt sister unable to be reached at number provided by OT [phone] (out of service), attempted call again to [phone], no answer but HIPAA compliant message left on machine.            2:57pm- CSW met with pt at bedside, pt amenable to speaking with CSW but fixated on desire to get back in bed. Introduced self, role, reason for visit. Pt from [place] where she has resided for 2 years. Pt was living with her son prior to this. She does not feel she has enough assistance post fall to return to [place] at this time.   Pt gave verbal permission for her sister to be contacted regarding facility placement as she assists her with decision making. Attempted number on facesheet, no answer. Will attempt additional number provided by OT.    Expected Discharge Plan: Skilled Nursing Facility Barriers to Discharge: Continued Medical Work up   Patient Goals and CMS Choice   CMS Medicare.gov Compare Post Acute Care list provided to:: Patient Choice offered to / list presented to : Patient  Expected Discharge Plan and Services Expected Discharge Plan: [place] In-house Referral: Clinical Social Work Discharge Planning Services: NA Post Acute Care Choice: [place] Living arrangements for the past 2 months: [place]  Prior Living Arrangements/Services Living arrangements for the past 2 months: [place] Lives with:: Facility Resident Patient language and need for interpreter reviewed:: Yes(no needs) Do you feel safe going back to the place where you live?: Yes      Need for Family Participation in Patient Care: Yes (Comment)(assistance with making  decisions) Care giver support system in place?: Yes (comment)(siblings, adult children) Current home services: DME Criminal Activity/Legal Involvement Pertinent to Current Situation/Hospitalization: No - Comment as needed  Activities of Daily Living      Permission Sought/Granted Permission sought to share information with : Facility Sport and exercise psychologist, Family Supports Permission granted to share information with : Yes, Verbal Permission Granted  Share Information with NAME: Lavella Hammock  Permission granted to share info w AGENCY: [employer], SNFs  Permission granted to share info w Relationship: sister  Permission granted to share info w Contact Information: [phone]  Emotional Assessment Appearance:: Appears stated age Attitude/Demeanor/Rapport: Engaged Affect (typically observed): Accepting, Adaptable, Appropriate, Blunt Orientation: : Oriented to Self, Oriented to Place, Oriented to Situation, Oriented to  Time Alcohol / Substance Use: Not Applicable Psych Involvement: No (comment)  Admission diagnosis:  Subarachnoid hemorrhage ([place]) [I60.9] Fall, initial encounter [W19.XXXA] Laceration of scalp, initial encounter [S01.01XA] Traumatic subdural hematoma without loss of consciousness, initial encounter [facility]) [S06.5X0A] Patient Active Problem List   Diagnosis Date Noted  . Traumatic subdural hematoma ([place]) 08/08/2018  . Pyelonephritis 09/09/2016  . Falls frequently 06/24/2016  . Tension headache 11/10/2013  . Chest pain 11/03/2011  . ARF (acute renal failure) ([place]) 04/30/2011  . UTI (urinary tract infection) 04/30/2011  . CKD (chronic kidney disease), stage IV ([place]) 04/30/2011  . Anemia 04/30/2011  . S/p nephrectomy 04/30/2011  . Macular degeneration 04/30/2011  . Cataract 04/30/2011  . Bladder cancer ([place]) 04/30/2011  . Weakness generalized 04/29/2011  . Seizure disorder ([place]) 04/29/2011  .  Benign hypertension 04/29/2011  . CAD (coronary artery disease)  04/29/2011  . Hyperlipidemia 04/29/2011  . Anxiety 04/29/2011  . Dizziness - light-headed 04/29/2011  . Chronic abdominal pain 04/29/2011  . Mixed hyperlipidemia 05/24/2008  . Essential hypertension, benign 05/24/2008  . Postsurgical aortocoronary bypass status 05/24/2008   PCP:  Celene Squibb, MD Pharmacy:  No Pharmacies Listed    Social Determinants of Health (SDOH) Interventions    Readmission Risk Interventions No flowsheet data found.

## 2018-08-10 DIAGNOSIS — I609 Nontraumatic subarachnoid hemorrhage, unspecified: Secondary | ICD-10-CM

## 2018-08-10 DIAGNOSIS — I6200 Nontraumatic subdural hemorrhage, unspecified: Secondary | ICD-10-CM | POA: Diagnosis not present

## 2018-08-10 DIAGNOSIS — R569 Unspecified convulsions: Secondary | ICD-10-CM | POA: Diagnosis not present

## 2018-08-10 DIAGNOSIS — M6281 Muscle weakness (generalized): Secondary | ICD-10-CM | POA: Diagnosis not present

## 2018-08-10 DIAGNOSIS — S065X0D Traumatic subdural hemorrhage without loss of consciousness, subsequent encounter: Secondary | ICD-10-CM | POA: Diagnosis not present

## 2018-08-10 DIAGNOSIS — S066X0A Traumatic subarachnoid hemorrhage without loss of consciousness, initial encounter: Secondary | ICD-10-CM | POA: Diagnosis not present

## 2018-08-10 DIAGNOSIS — Z7401 Bed confinement status: Secondary | ICD-10-CM | POA: Diagnosis not present

## 2018-08-10 DIAGNOSIS — S0101XA Laceration without foreign body of scalp, initial encounter: Secondary | ICD-10-CM | POA: Diagnosis not present

## 2018-08-10 DIAGNOSIS — M2550 Pain in unspecified joint: Secondary | ICD-10-CM | POA: Diagnosis not present

## 2018-08-10 DIAGNOSIS — I2510 Atherosclerotic heart disease of native coronary artery without angina pectoris: Secondary | ICD-10-CM | POA: Diagnosis not present

## 2018-08-10 DIAGNOSIS — N184 Chronic kidney disease, stage 4 (severe): Secondary | ICD-10-CM | POA: Diagnosis not present

## 2018-08-10 DIAGNOSIS — R1310 Dysphagia, unspecified: Secondary | ICD-10-CM | POA: Diagnosis not present

## 2018-08-10 DIAGNOSIS — E785 Hyperlipidemia, unspecified: Secondary | ICD-10-CM | POA: Diagnosis not present

## 2018-08-10 DIAGNOSIS — Z4802 Encounter for removal of sutures: Secondary | ICD-10-CM | POA: Diagnosis not present

## 2018-08-10 DIAGNOSIS — R2689 Other abnormalities of gait and mobility: Secondary | ICD-10-CM | POA: Diagnosis not present

## 2018-08-10 DIAGNOSIS — I509 Heart failure, unspecified: Secondary | ICD-10-CM | POA: Diagnosis not present

## 2018-08-10 DIAGNOSIS — W19XXXA Unspecified fall, initial encounter: Secondary | ICD-10-CM

## 2018-08-10 DIAGNOSIS — I10 Essential (primary) hypertension: Secondary | ICD-10-CM | POA: Diagnosis not present

## 2018-08-10 DIAGNOSIS — S065X9A Traumatic subdural hemorrhage with loss of consciousness of unspecified duration, initial encounter: Secondary | ICD-10-CM | POA: Diagnosis not present

## 2018-08-10 MED ORDER — CLONAZEPAM 0.5 MG PO TABS: 0.5000 mg | ORAL_TABLET | Freq: Every day | ORAL | 0 refills | Status: DC

## 2018-08-10 MED ORDER — HYDROCODONE-ACETAMINOPHEN 10-325 MG PO TABS: 1.0000 | ORAL_TABLET | Freq: Four times a day (QID) | ORAL | 0 refills | Status: DC | PRN

## 2018-08-10 MED ORDER — GABAPENTIN 300 MG PO CAPS: 300.0000 mg | ORAL_CAPSULE | Freq: Every day | ORAL | Status: DC

## 2018-08-10 NOTE — Progress Notes (Signed)
Subjective: Patient reports Patient feeling much better minimal headache  Objective: Vital signs in last 24 hours: Temp:  [97.7 F (36.5 C)-98.8 F (37.1 C)] 98.7 F (37.1 C) (05/05 0744) Pulse Rate:  [64-81] 81 (05/05 0744) Resp:  [16-20] 20 (05/05 0744) BP: (128-168)/(57-92) 135/92 (05/05 0744) SpO2:  [94 %-96 %] 96 % (05/05 0744)  Intake/Output from previous day: 05/04 0701 - 05/05 0700 In: 480 [P.O.:480] Out: 950 [Urine:950] Intake/Output this shift: No intake/output data recorded.  Awake alert and oriented moves all extremities well  Lab Results: Recent Labs    08/08/18 0505 08/09/18 0504  WBC 8.6 8.1  HGB 12.5 11.9*  HCT 39.7 38.0  PLT 160 147*   BMET Recent Labs    08/08/18 0505 08/09/18 0504  NA 140 140  K 5.0 4.5  CL 103 106  CO2 23 24  GLUCOSE 106* 105*  BUN 29* 28*  CREATININE 2.77* 2.77*  CALCIUM 9.6 9.0    Studies/Results: Ct Head Wo Contrast  Result Date: 08/08/2018 CLINICAL DATA:  Status post fall, low back pain EXAM: CT HEAD WITHOUT CONTRAST TECHNIQUE: Contiguous axial images were obtained from the base of the skull through the vertex without intravenous contrast. COMPARISON:  08/08/2018 FINDINGS: Brain: No evidence of acute infarction, extra-axial collection, ventriculomegaly, or mass effect. Small amount of subdural blood along the interhemispheric falx and small volume subarachnoid hemorrhage in the medial right parietal lobe. No midline shift. Old right basal ganglia lacunar infarct. Generalized cerebral atrophy. Periventricular white matter low attenuation likely secondary to microangiopathy. Vascular: Cerebrovascular atherosclerotic calcifications are noted. Skull: Prior right craniotomy.  No acute osseous abnormality. Sinuses/Orbits: Visualized portions of the orbits are unremarkable. Visualized portions of the paranasal sinuses and mastoid air cells are unremarkable. Other: None. IMPRESSION: 1. Persistent small amount of subdural hemorrhage  along the interhemispheric falx. Small volume subarachnoid hemorrhage in the medial right parietal lobe. No significant interval change compared with the prior exam. Electronically Signed   By: Kathreen Devoid   On: 08/08/2018 12:36    Assessment/Plan: Hospital day 2 close head injury stable neurologic improved patient to be discharged from a neurosurgical perspective  LOS: 1 day     [place] 08/10/2018, 8:04 AM

## 2018-08-10 NOTE — Social Work (Signed)
Clinical Social Worker facilitated patient discharge including contacting patient family and facility to confirm patient discharge plans.  Clinical information faxed to facility and family agreeable with plan.  CSW arranged ambulance transport via [place] to [facility].  RN to call [phone]  with report prior to discharge.  Clinical Social Worker will sign off for now as social work intervention is no longer needed. Please consult [place] again if new need arises.  Westley Hummer, MSW, [place] Social Worker [phone]

## 2018-08-10 NOTE — TOC Transition Note (Signed)
Transition of Care [facility]) - CM/SW Discharge Note   Patient Details  Name: [name] [name] MRN: [id number] Date of Birth: 1929/01/31  [facility] of [facility]) CM/SW Contact:  Alexander Mt, [place] Phone Number: 08/10/2018, 2:20 PM   Clinical Narrative:    CSW spoke with [facility], pt completed paperwork for SNF. Due to vision challenges CSW verbally read paperwork. Will provide pt with a copy as well. Pt requesting her sister bring clothing to SNF for her, called and informed her of this request.   PTAR called for 4pm   Final next level of care: Skilled Nursing Facility Barriers to Discharge: Barriers Resolved   Patient Goals and CMS Choice Patient states their goals for this hospitalization and ongoing recovery are:: to get more help before going home CMS Medicare.gov Compare Post Acute Care list provided to:: Patient(and verbally to pt sister) Choice offered to / list presented to : Patient, Sibling  Discharge Placement              Patient chooses bed at: Other - please specify in the comment section below: Patient to be transferred to facility by: [place] Name of family member notified: pt sister Patient and family notified of of transfer: 08/10/18  Discharge Plan and Services In-house Referral: Clinical Social Work Discharge Planning Services: NA Post Acute Care Choice: [place]                               Social Determinants of Health (SDOH) Interventions     Readmission Risk Interventions No flowsheet data found.

## 2018-08-10 NOTE — TOC Progression Note (Addendum)
Transition of Care [facility]) - Progression Note    Patient Details  Name: [name] [name] MRN: [id number] Date of Birth: February 24, 1929  Transition of Care [facility]) CM/SW [place], [place] Phone Number: 08/10/2018, 11:16 AM  Clinical Narrative:    12:08pm- Pt agreeable to either SNF with private room, both [facility] and [facility] can offer one. Pt sister called again and she has selected [facility]. Paperwork will be emailed to Armed forces training and education officer to assist pt with completing.   11:16am- Was able to reach pt sister Hoyle Sauer on phone, explained that pt has been requiring max assistance with therapy here that cannot be provided at ALF- provided her with verbal offers and CMS ratings, at pt sister request will f/u with [facility] and [facility] for private room availability.   Pt sister had questions regarding pt medications and requested MD to f/u. They are aware pt cleared for discharge and CSW explained that pt length of stay at SNF depends on medical necessity and insurance coverage.    Expected Discharge Plan: [place] Barriers to Discharge: Continued Medical Work up  Expected Discharge Plan and Services Expected Discharge Plan: [place] In-house Referral: Clinical Social Work Discharge Planning Services: NA Post Acute Care Choice: [place] Living arrangements for the past 2 months: [place] Expected Discharge Date: 08/10/18                                     Social Determinants of Health (SDOH) Interventions    Readmission Risk Interventions No flowsheet data found.

## 2018-08-10 NOTE — Discharge Summary (Signed)
Physician Discharge Summary  [name] [name] EQA:[id number] DOB: 05/23/1928 DOA: 08/08/2018  PCP: Celene Squibb, MD  Admit date: 08/08/2018 Discharge date: 08/10/2018  Time spent: 35 minutes  Recommendations for Outpatient Follow-up:  1. PCP in 1 week 2. Caution with oversedation, patient was on numerous sedating medications with CKD 4, cut down gabapentin and Klonopin dose 3. Consider palliative discussions with PCP   Discharge Diagnoses:  Principal Problem:   Traumatic subdural hematoma (HCC) Chronic pain syndrome History of seizure disorder Polypharmacy   Benign hypertension   Hyperlipidemia   CKD (chronic kidney disease), stage IV ([place])   Discharge Condition: Stable  Diet recommendation: Low-sodium, renal diet  Filed Weights   08/08/18 0301  Weight: 72.6 kg    History of present illness:  [name] [name] Popeis a 85 y.o.femalewith medical history significant ofseizure; HLD; HTN; stage 4 CKD; CAD; and bladder cancer presenting after a fall.Patient was in her facility and was reaching for a remote control without use of her walker when she fell and hit her head. -Head CT noted small subdural hematoma and subarachnoid hemorrhage  Hospital Course:   Traumatic subdural hematoma -Admitted with traumatic small subdural hematoma and subarachnoid hemorrhage following a mechanical fall -Occipital laceration was repaired in the emergency room with staples -Repeat CT head was stable -Seen by neurosurgery, nonsurgical management recommended -Remained stable -PT OT evaluation completed, originally SNF was recommended yesterday, patient declines this wishes to go back to her assisted living, will add home health services there  Metabolic encephalopathy  -Patient was noted to be overtly drowsy yesterday , this was secondary to polypharmacy namely Klonopin, gabapentin and Vicodin  -Decrease gabapentin dose to 300 mg nightly due to CKD 4  -Cutdown Klonopin to bedtime only  -Continued on  Vicodin , advised patient not to take this when she is sleepy or tired  -Afebrile and nontoxic -Patient improved back to baseline, caution with numerous sedating medications  Seizure disorder -Continue Keppra -Stable  HTN -Continue Norvasc  Stage IV CKD -Baseline creatinine around 2, from 2 years ago  -Currently stable at 2.7 which must be her current baseline -Renal dosing of medications -Stable, anticipate she would not be a good dialysis candidate should her kidney function deteriorate, recommend further goals of care discussions and palliative evaluation at assisted living per PCP  Chronic pain -See discussion above, Vicodin every 6 hours as needed and gabapentin dose cut down as discussed above  Code Status:DNR  Discharge Exam: Vitals:   08/10/18 0424 08/10/18 0744  BP: (!) 168/60 (!) 135/92  Pulse: 66 81  Resp:  20  Temp: 97.7 F (36.5 C) 98.7 F (37.1 C)  SpO2: 94% 96%    General: AAOx2 Cardiovascular: S1S2/RRR Respiratory: CTAB  Discharge Instructions   Discharge Instructions    Diet - low sodium heart healthy   Complete by:  As directed    Increase activity slowly   Complete by:  As directed      Allergies as of 08/10/2018      Reactions   Adhesive [tape] Other (See Comments)   Tears patients skin off.    Keflet [cephalexin] Nausea And Vomiting   Latex Itching   Penicillins Hives, Itching   Sulfa Antibiotics Hives, Itching      Medication List    TAKE these medications   amLODipine 2.5 MG tablet Commonly known as:  NORVASC Take 2.5 mg by mouth daily.   AZO Bladder Control/Go-Less Caps Take by mouth.   Belsomra 20 MG Tabs Generic  drug:  Suvorexant Take 20 mg by mouth at bedtime.   clonazePAM 0.5 MG tablet Commonly known as:  KLONOPIN Take 1 tablet (0.5 mg total) by mouth at bedtime. What changed:  when to take this   Cranberry 250 MG Tabs Take 250 mg by mouth daily.   Crestor 10 MG tablet Generic drug:  rosuvastatin Take  10 mg by mouth daily.   gabapentin 300 MG capsule Commonly known as:  NEURONTIN Take 1 capsule (300 mg total) by mouth at bedtime. What changed:  when to take this   HYDROcodone-acetaminophen 10-325 MG tablet Commonly known as:  NORCO Take 1 tablet by mouth every 6 (six) hours as needed for moderate pain. What changed:    when to take this  reasons to take this   levETIRAcetam 250 MG tablet Commonly known as:  KEPPRA Take 1 tablet (250 mg total) by mouth 2 (two) times daily.   Linzess 145 MCG Caps capsule Generic drug:  linaclotide Take 145 mcg by mouth at bedtime.   Magnesium 250 MG Tabs Take 250 mg by mouth daily.   polyethylene glycol 17 g packet Commonly known as:  MIRALAX / GLYCOLAX Take 17 g by mouth daily as needed for moderate constipation.   PRESERVISION AREDS PO Take 1 capsule by mouth daily.   Centrum Ultra Womens Tabs Take by mouth.   torsemide 10 MG tablet Commonly known as:  DEMADEX Take 10 mg by mouth every other day.      Allergies  Allergen Reactions  . Adhesive [Tape] Other (See Comments)    Tears patients skin off.   [name] [Cephalexin] Nausea And Vomiting  . Latex Itching  . Penicillins Hives and Itching  . Sulfa Antibiotics Hives and Itching      The results of significant diagnostics from this hospitalization (including imaging, microbiology, ancillary and laboratory) are listed below for reference.    Significant Diagnostic Studies: Ct Head Wo Contrast  Result Date: 08/08/2018 CLINICAL DATA:  Status post fall, low back pain EXAM: CT HEAD WITHOUT CONTRAST TECHNIQUE: Contiguous axial images were obtained from the base of the skull through the vertex without intravenous contrast. COMPARISON:  08/08/2018 FINDINGS: Brain: No evidence of acute infarction, extra-axial collection, ventriculomegaly, or mass effect. Small amount of subdural blood along the interhemispheric falx and small volume subarachnoid hemorrhage in the medial right  parietal lobe. No midline shift. Old right basal ganglia lacunar infarct. Generalized cerebral atrophy. Periventricular white matter low attenuation likely secondary to microangiopathy. Vascular: Cerebrovascular atherosclerotic calcifications are noted. Skull: Prior right craniotomy.  No acute osseous abnormality. Sinuses/Orbits: Visualized portions of the orbits are unremarkable. Visualized portions of the paranasal sinuses and mastoid air cells are unremarkable. Other: None. IMPRESSION: 1. Persistent small amount of subdural hemorrhage along the interhemispheric falx. Small volume subarachnoid hemorrhage in the medial right parietal lobe. No significant interval change compared with the prior exam. Electronically Signed   By: Kathreen Devoid   On: 08/08/2018 12:36   Ct Head Wo Contrast  Result Date: 08/08/2018 CLINICAL DATA:  Fall. EXAM: CT HEAD WITHOUT CONTRAST CT CERVICAL SPINE WITHOUT CONTRAST TECHNIQUE: Multidetector CT imaging of the head and cervical spine was performed following the standard protocol without intravenous contrast. Multiplanar CT image reconstructions of the cervical spine were also generated. COMPARISON:  04/04/2016 FINDINGS: CT HEAD FINDINGS Brain: There is interhemispheric hyperdense blood noted between the frontal lobes compatible with small subdural hematoma. Blood is also noted along the right side of the falx, best seen on coronal  imaging. There is also subarachnoid blood noted medially in the right parietal lobe seen on axial image 25 and sagittal image 32-33. No mass effect or midline shift. No intraparenchymal hemorrhage. Old right basal ganglia lacunar infarcts. Old right posterior parietal infarct underlying craniotomy defect. There is atrophy and chronic small vessel disease changes. Vascular: No hyperdense vessel or unexpected calcification. Skull: No acute calvarial abnormality. Prior right parietal craniotomy. Sinuses/Orbits: No acute finding Other: None CT CERVICAL SPINE  FINDINGS Alignment: No subluxation Skull base and vertebrae: No acute fracture. No primary bone lesion or focal pathologic process. Soft tissues and spinal canal: No prevertebral fluid or swelling. No visible canal hematoma. Disc levels: Fusion across the C2-3 disc space. Degenerative disc and facet disease throughout the cervical spine. Upper chest: Calcified granulomas in the upper lobes bilaterally. No acute findings Other: Carotid artery calcifications. IMPRESSION: Subdural blood along the falx and between the frontal lobes, likely subdural hematoma. Small amount of subarachnoid blood noted in the medial right parietal lobe. No midline shift. Atrophy, chronic small vessel disease. Old right basal ganglia lacunar infarcts. No acute bony abnormality in the cervical spine. Critical Value/emergent results were called by telephone at the time of interpretation on 08/08/2018 at 3:49 am to [facility] , who verbally acknowledged these results. Electronically Signed   By: Rolm Baptise M.D.   On: 08/08/2018 03:50   Ct Cervical Spine Wo Contrast  Result Date: 08/08/2018 CLINICAL DATA:  Fall. EXAM: CT HEAD WITHOUT CONTRAST CT CERVICAL SPINE WITHOUT CONTRAST TECHNIQUE: Multidetector CT imaging of the head and cervical spine was performed following the standard protocol without intravenous contrast. Multiplanar CT image reconstructions of the cervical spine were also generated. COMPARISON:  04/04/2016 FINDINGS: CT HEAD FINDINGS Brain: There is interhemispheric hyperdense blood noted between the frontal lobes compatible with small subdural hematoma. Blood is also noted along the right side of the falx, best seen on coronal imaging. There is also subarachnoid blood noted medially in the right parietal lobe seen on axial image 25 and sagittal image 32-33. No mass effect or midline shift. No intraparenchymal hemorrhage. Old right basal ganglia lacunar infarcts. Old right posterior parietal infarct underlying craniotomy defect.  There is atrophy and chronic small vessel disease changes. Vascular: No hyperdense vessel or unexpected calcification. Skull: No acute calvarial abnormality. Prior right parietal craniotomy. Sinuses/Orbits: No acute finding Other: None CT CERVICAL SPINE FINDINGS Alignment: No subluxation Skull base and vertebrae: No acute fracture. No primary bone lesion or focal pathologic process. Soft tissues and spinal canal: No prevertebral fluid or swelling. No visible canal hematoma. Disc levels: Fusion across the C2-3 disc space. Degenerative disc and facet disease throughout the cervical spine. Upper chest: Calcified granulomas in the upper lobes bilaterally. No acute findings Other: Carotid artery calcifications. IMPRESSION: Subdural blood along the falx and between the frontal lobes, likely subdural hematoma. Small amount of subarachnoid blood noted in the medial right parietal lobe. No midline shift. Atrophy, chronic small vessel disease. Old right basal ganglia lacunar infarcts. No acute bony abnormality in the cervical spine. Critical Value/emergent results were called by telephone at the time of interpretation on 08/08/2018 at 3:49 am to [facility] , who verbally acknowledged these results. Electronically Signed   By: Rolm Baptise M.D.   On: 08/08/2018 03:50    Microbiology: Recent Results (from the past 240 hour(s))  SARS Coronavirus 2 [facility] order, Performed in [place] hospital lab)     Status: None   Collection Time: 08/08/18  3:57 AM  Result  Value Ref Range Status   SARS Coronavirus 2 NEGATIVE NEGATIVE Final    Comment: (NOTE) If result is NEGATIVE SARS-CoV-2 target nucleic acids are NOT DETECTED. The SARS-CoV-2 RNA is generally detectable in upper and lower  respiratory specimens during the acute phase of infection. The lowest  concentration of SARS-CoV-2 viral copies this assay can detect is 250  copies / mL. A negative result does not preclude SARS-CoV-2 infection  and should not be used  as the sole basis for treatment or other  patient management decisions.  A negative result may occur with  improper specimen collection / handling, submission of specimen other  than nasopharyngeal swab, presence of viral mutation(s) within the  areas targeted by this assay, and inadequate number of viral copies  (<250 copies / mL). A negative result must be combined with clinical  observations, patient history, and epidemiological information. If result is POSITIVE SARS-CoV-2 target nucleic acids are DETECTED. The SARS-CoV-2 RNA is generally detectable in upper and lower  respiratory specimens dur ing the acute phase of infection.  Positive  results are indicative of active infection with SARS-CoV-2.  Clinical  correlation with patient history and other diagnostic information is  necessary to determine patient infection status.  Positive results do  not rule out bacterial infection or co-infection with other viruses. If result is PRESUMPTIVE POSTIVE SARS-CoV-2 nucleic acids MAY BE PRESENT.   A presumptive positive result was obtained on the submitted specimen  and confirmed on repeat testing.  While 2019 novel coronavirus  (SARS-CoV-2) nucleic acids may be present in the submitted sample  additional confirmatory testing may be necessary for epidemiological  and / or clinical management purposes  to differentiate between  SARS-CoV-2 and other Sarbecovirus currently known to infect humans.  If clinically indicated additional testing with an alternate test  methodology [phone]) is advised. The SARS-CoV-2 RNA is generally  detectable in upper and lower respiratory sp ecimens during the acute  phase of infection. The expected result is Negative. Fact Sheet for Patients:  StrictlyIdeas.no Fact Sheet for Healthcare Providers: BankingDealers.co.za This test is not yet approved or cleared by the [place] FDA and has been authorized for  detection and/or diagnosis of SARS-CoV-2 by FDA under an Emergency Use Authorization (EUA).  This EUA will remain in effect (meaning this test can be used) for the duration of the COVID-19 declaration under Section 564(b)(1) of the Act, 21 U.S.C. section 360bbb-3(b)(1), unless the authorization is terminated or revoked sooner. Performed at [facility], [place] [address], [place], [place] [address]      Labs: Basic Metabolic Panel: Recent Labs  Lab 08/08/18 0505 08/09/18 0504  NA 140 140  K 5.0 4.5  CL 103 106  CO2 23 24  GLUCOSE 106* 105*  BUN 29* 28*  CREATININE 2.77* 2.77*  CALCIUM 9.6 9.0   Liver Function Tests: No results for input(s): AST, ALT, ALKPHOS, BILITOT, PROT, ALBUMIN in the last 168 hours. No results for input(s): LIPASE, AMYLASE in the last 168 hours. No results for input(s): AMMONIA in the last 168 hours. CBC: Recent Labs  Lab 08/08/18 0505 08/09/18 0504  WBC 8.6 8.1  NEUTROABS 4.4  --   HGB 12.5 11.9*  HCT 39.7 38.0  MCV 94.5 93.8  PLT 160 147*   Cardiac Enzymes: No results for input(s): CKTOTAL, CKMB, CKMBINDEX, TROPONINI in the last 168 hours. BNP: BNP (last 3 results) No results for input(s): BNP in the last 8760 hours.  ProBNP (last 3 results) No  results for input(s): PROBNP in the last 8760 hours.  CBG: No results for input(s): GLUCAP in the last 168 hours.     Signed:  Domenic Polite MD.  [facility] 08/10/2018, 10:52 AM

## 2018-08-10 NOTE — Progress Notes (Signed)
Physical Therapy Treatment Patient Details Name: JEFF MCCALLUM MRN: [id number] DOB: [birth year] Today's Date: 08/10/2018    History of Present Illness NOEL HENANDEZ is a 85 y.o. female with medical history significant of seizure; HLD; HTN; stage 4 CKD; CAD; and bladder cancer presenting after a posterior fall at her ALF. Pt sustained small parafalcine subdural and traumatic subarachnoid hemorrhage    PT Comments    Patient seen for activity progression. Patient up in chair upon arrival. Continues to demonstrate significant limitations in mobility and functional performance as well as overall activity tolerance. Session limited to transfers to and from [facility] and one attempt at ambulation (limited to 3 feet max assist). Current POC remains appropriate, will need post acute rehabilitation and significant physical assist upon discharge. SNF recommended.   Follow Up Recommendations  SNF;Supervision/Assistance - 24 hour     Equipment Recommendations  None recommended by PT    Recommendations for Other Services OT consult     Precautions / Restrictions Precautions Precautions: Fall Restrictions Weight Bearing Restrictions: No    Mobility  Bed Mobility               General bed mobility comments: in chair on arrival  Transfers Overall transfer level: Needs assistance Equipment used: Rolling walker (2 wheeled) Transfers: Sit to/from [employer] Transfers Sit to Stand: Max assist;+2 physical assistance         General transfer comment: +2 mod assist to power up but max assist to maintain upright with significant posterior list. Attempted again with RW and without, patient with poor ability to negotiate transfer with RW. Reverted to face to face sit <> stand with pivot to chair from [facility]  Ambulation/Gait Ambulation/Gait assistance: Max assist Gait Distance (Feet): 3 Feet Assistive device: Rolling walker (2 wheeled)           Stairs             Wheelchair  Mobility    Modified Rankin (Stroke Patients Only) Modified Rankin (Stroke Patients Only) Pre-Morbid Rankin Score: Moderate disability Modified Rankin: Severe disability     Balance Overall balance assessment: Needs assistance;History of Falls   Sitting balance-Leahy Scale: Poor   Postural control: Posterior lean Standing balance support: Bilateral upper extremity supported Standing balance-Leahy Scale: Poor Standing balance comment: heavy use of UE support, difficulty maintaining balance                            Cognition Arousal/Alertness: Awake/alert Behavior During Therapy: Flat affect Overall Cognitive Status: Impaired/Different from baseline Area of Impairment: Following commands;Problem solving;Attention;Memory                   Current Attention Level: Sustained Memory: Decreased short-term memory Following Commands: Follows one step commands consistently;Follows one step commands with increased time     Problem Solving: Slow processing;Requires verbal cues;Requires tactile cues;Decreased initiation;Difficulty sequencing General Comments: Increased time and effort for processing throughout tasks      Exercises      General Comments General comments (skin integrity, edema, etc.): assist for hygiene and pericare      Pertinent Vitals/Pain Pain Assessment: 0-10 Pain Score: 9  Pain Location: Back, right side, and head Pain Descriptors / Indicators: Sharp Pain Intervention(s): Monitored during session    Home Living                      Prior Function  PT Goals (current goals can now be found in the care plan section) Acute Rehab PT Goals Patient Stated Goal: to go to rehab near my sister carole [phone] PT Goal Formulation: With patient Time For Goal Achievement: 08/23/18 Potential to Achieve Goals: Fair Progress towards PT goals: Progressing toward goals(modest progression at best)    Frequency    Min  3X/week      PT Plan Current plan remains appropriate    Co-evaluation              AM-PAC PT "6 Clicks" Mobility   Outcome Measure  Help needed turning from your back to your side while in a flat bed without using bedrails?: A Lot Help needed moving from lying on your back to sitting on the side of a flat bed without using bedrails?: A Lot Help needed moving to and from a bed to a chair (including a wheelchair)?: A Lot Help needed standing up from a chair using your arms (e.g., wheelchair or bedside chair)?: A Lot Help needed to walk in hospital room?: Total Help needed climbing 3-5 steps with a railing? : Total 6 Click Score: 10    End of Session Equipment Utilized During Treatment: Gait belt Activity Tolerance: Patient limited by lethargy Patient left: in chair;with call bell/phone within reach Nurse Communication: Mobility status PT Visit Diagnosis: Unsteadiness on feet (R26.81);Muscle weakness (generalized) (M62.81);Difficulty in walking, not elsewhere classified (R26.2)     Time: 1594-7076 PT Time Calculation (min) (ACUTE ONLY): 22 min  Charges:  $Therapeutic Activity: 8-22 mins                     Alben Deeds, PT DPT  Board Certified Neurologic Specialist Acute Rehabilitation Services Pager [phone] Office [place] 08/10/2018, 9:21 AM

## 2018-08-11 DIAGNOSIS — I609 Nontraumatic subarachnoid hemorrhage, unspecified: Secondary | ICD-10-CM | POA: Diagnosis not present

## 2018-08-11 DIAGNOSIS — S065X9A Traumatic subdural hemorrhage with loss of consciousness of unspecified duration, initial encounter: Secondary | ICD-10-CM | POA: Diagnosis not present

## 2018-08-11 DIAGNOSIS — I10 Essential (primary) hypertension: Secondary | ICD-10-CM | POA: Diagnosis not present

## 2018-08-11 DIAGNOSIS — I509 Heart failure, unspecified: Secondary | ICD-10-CM | POA: Diagnosis not present

## 2018-08-13 DIAGNOSIS — I10 Essential (primary) hypertension: Secondary | ICD-10-CM | POA: Diagnosis not present

## 2018-08-13 DIAGNOSIS — I609 Nontraumatic subarachnoid hemorrhage, unspecified: Secondary | ICD-10-CM | POA: Diagnosis not present

## 2018-08-13 DIAGNOSIS — I2510 Atherosclerotic heart disease of native coronary artery without angina pectoris: Secondary | ICD-10-CM | POA: Diagnosis not present

## 2018-08-13 DIAGNOSIS — S065X9A Traumatic subdural hemorrhage with loss of consciousness of unspecified duration, initial encounter: Secondary | ICD-10-CM | POA: Diagnosis not present

## 2018-08-23 DIAGNOSIS — E785 Hyperlipidemia, unspecified: Secondary | ICD-10-CM | POA: Diagnosis not present

## 2018-08-23 DIAGNOSIS — Z4802 Encounter for removal of sutures: Secondary | ICD-10-CM | POA: Diagnosis not present

## 2018-08-23 DIAGNOSIS — R569 Unspecified convulsions: Secondary | ICD-10-CM | POA: Diagnosis not present

## 2018-08-23 DIAGNOSIS — N184 Chronic kidney disease, stage 4 (severe): Secondary | ICD-10-CM | POA: Diagnosis not present

## 2018-08-27 DIAGNOSIS — R569 Unspecified convulsions: Secondary | ICD-10-CM | POA: Diagnosis not present

## 2018-08-27 DIAGNOSIS — N184 Chronic kidney disease, stage 4 (severe): Secondary | ICD-10-CM | POA: Diagnosis not present

## 2018-08-27 DIAGNOSIS — E785 Hyperlipidemia, unspecified: Secondary | ICD-10-CM | POA: Diagnosis not present

## 2018-09-03 DIAGNOSIS — I509 Heart failure, unspecified: Secondary | ICD-10-CM | POA: Diagnosis not present

## 2018-09-03 DIAGNOSIS — E785 Hyperlipidemia, unspecified: Secondary | ICD-10-CM | POA: Diagnosis not present

## 2018-09-03 DIAGNOSIS — I2510 Atherosclerotic heart disease of native coronary artery without angina pectoris: Secondary | ICD-10-CM | POA: Diagnosis not present

## 2018-09-03 DIAGNOSIS — I10 Essential (primary) hypertension: Secondary | ICD-10-CM | POA: Diagnosis not present

## 2018-09-10 DIAGNOSIS — I509 Heart failure, unspecified: Secondary | ICD-10-CM | POA: Diagnosis not present

## 2018-09-10 DIAGNOSIS — I10 Essential (primary) hypertension: Secondary | ICD-10-CM | POA: Diagnosis not present

## 2018-09-10 DIAGNOSIS — I609 Nontraumatic subarachnoid hemorrhage, unspecified: Secondary | ICD-10-CM | POA: Diagnosis not present

## 2018-09-10 DIAGNOSIS — S065X9A Traumatic subdural hemorrhage with loss of consciousness of unspecified duration, initial encounter: Secondary | ICD-10-CM | POA: Diagnosis not present

## 2018-09-22 DIAGNOSIS — I10 Essential (primary) hypertension: Secondary | ICD-10-CM | POA: Diagnosis not present

## 2018-09-22 DIAGNOSIS — N183 Chronic kidney disease, stage 3 (moderate): Secondary | ICD-10-CM | POA: Diagnosis not present

## 2018-09-22 DIAGNOSIS — S065X9A Traumatic subdural hemorrhage with loss of consciousness of unspecified duration, initial encounter: Secondary | ICD-10-CM | POA: Diagnosis not present

## 2018-09-22 DIAGNOSIS — E782 Mixed hyperlipidemia: Secondary | ICD-10-CM | POA: Diagnosis not present

## 2018-09-22 DIAGNOSIS — D509 Iron deficiency anemia, unspecified: Secondary | ICD-10-CM | POA: Diagnosis not present

## 2018-09-22 DIAGNOSIS — R2689 Other abnormalities of gait and mobility: Secondary | ICD-10-CM | POA: Diagnosis not present

## 2018-09-24 DIAGNOSIS — I10 Essential (primary) hypertension: Secondary | ICD-10-CM | POA: Diagnosis not present

## 2018-09-24 DIAGNOSIS — R2689 Other abnormalities of gait and mobility: Secondary | ICD-10-CM | POA: Diagnosis not present

## 2018-09-24 DIAGNOSIS — S065X9A Traumatic subdural hemorrhage with loss of consciousness of unspecified duration, initial encounter: Secondary | ICD-10-CM | POA: Diagnosis not present

## 2018-09-28 DIAGNOSIS — S065X9A Traumatic subdural hemorrhage with loss of consciousness of unspecified duration, initial encounter: Secondary | ICD-10-CM | POA: Diagnosis not present

## 2018-09-28 DIAGNOSIS — I10 Essential (primary) hypertension: Secondary | ICD-10-CM | POA: Diagnosis not present

## 2018-09-28 DIAGNOSIS — R2689 Other abnormalities of gait and mobility: Secondary | ICD-10-CM | POA: Diagnosis not present

## 2018-09-30 DIAGNOSIS — R2689 Other abnormalities of gait and mobility: Secondary | ICD-10-CM | POA: Diagnosis not present

## 2018-09-30 DIAGNOSIS — I10 Essential (primary) hypertension: Secondary | ICD-10-CM | POA: Diagnosis not present

## 2018-09-30 DIAGNOSIS — S065X9A Traumatic subdural hemorrhage with loss of consciousness of unspecified duration, initial encounter: Secondary | ICD-10-CM | POA: Diagnosis not present

## 2018-10-13 DIAGNOSIS — Z03818 Encounter for observation for suspected exposure to other biological agents ruled out: Secondary | ICD-10-CM | POA: Diagnosis not present

## 2018-11-01 DIAGNOSIS — Z03818 Encounter for observation for suspected exposure to other biological agents ruled out: Secondary | ICD-10-CM | POA: Diagnosis not present

## 2018-11-03 DIAGNOSIS — R7301 Impaired fasting glucose: Secondary | ICD-10-CM | POA: Diagnosis not present

## 2018-11-03 DIAGNOSIS — N184 Chronic kidney disease, stage 4 (severe): Secondary | ICD-10-CM | POA: Diagnosis not present

## 2018-11-03 DIAGNOSIS — L03113 Cellulitis of right upper limb: Secondary | ICD-10-CM | POA: Diagnosis not present

## 2018-11-03 DIAGNOSIS — G894 Chronic pain syndrome: Secondary | ICD-10-CM | POA: Diagnosis not present

## 2018-11-03 DIAGNOSIS — J060 Acute laryngopharyngitis: Secondary | ICD-10-CM | POA: Diagnosis not present

## 2018-11-03 DIAGNOSIS — I10 Essential (primary) hypertension: Secondary | ICD-10-CM | POA: Diagnosis not present

## 2018-11-03 DIAGNOSIS — M5430 Sciatica, unspecified side: Secondary | ICD-10-CM | POA: Diagnosis not present

## 2018-11-03 DIAGNOSIS — E782 Mixed hyperlipidemia: Secondary | ICD-10-CM | POA: Diagnosis not present

## 2018-11-08 DIAGNOSIS — R3914 Feeling of incomplete bladder emptying: Secondary | ICD-10-CM | POA: Diagnosis not present

## 2018-11-08 DIAGNOSIS — N3020 Other chronic cystitis without hematuria: Secondary | ICD-10-CM | POA: Diagnosis not present

## 2018-11-17 DIAGNOSIS — Z03818 Encounter for observation for suspected exposure to other biological agents ruled out: Secondary | ICD-10-CM | POA: Diagnosis not present

## 2018-12-01 DIAGNOSIS — Z03818 Encounter for observation for suspected exposure to other biological agents ruled out: Secondary | ICD-10-CM | POA: Diagnosis not present

## 2018-12-14 DIAGNOSIS — I129 Hypertensive chronic kidney disease with stage 1 through stage 4 chronic kidney disease, or unspecified chronic kidney disease: Secondary | ICD-10-CM | POA: Diagnosis not present

## 2018-12-14 DIAGNOSIS — Z905 Acquired absence of kidney: Secondary | ICD-10-CM | POA: Diagnosis not present

## 2018-12-14 DIAGNOSIS — E875 Hyperkalemia: Secondary | ICD-10-CM | POA: Diagnosis not present

## 2018-12-14 DIAGNOSIS — N184 Chronic kidney disease, stage 4 (severe): Secondary | ICD-10-CM | POA: Diagnosis not present

## 2018-12-14 DIAGNOSIS — R339 Retention of urine, unspecified: Secondary | ICD-10-CM | POA: Diagnosis not present

## 2018-12-16 DIAGNOSIS — Z03818 Encounter for observation for suspected exposure to other biological agents ruled out: Secondary | ICD-10-CM | POA: Diagnosis not present

## 2018-12-21 DIAGNOSIS — R3914 Feeling of incomplete bladder emptying: Secondary | ICD-10-CM | POA: Diagnosis not present

## 2018-12-21 DIAGNOSIS — N3020 Other chronic cystitis without hematuria: Secondary | ICD-10-CM | POA: Diagnosis not present

## 2018-12-29 DIAGNOSIS — Z03818 Encounter for observation for suspected exposure to other biological agents ruled out: Secondary | ICD-10-CM | POA: Diagnosis not present

## 2018-12-29 DIAGNOSIS — N184 Chronic kidney disease, stage 4 (severe): Secondary | ICD-10-CM | POA: Diagnosis not present

## 2018-12-30 ENCOUNTER — Other Ambulatory Visit: Payer: Self-pay | Admitting: Nephrology

## 2018-12-30 DIAGNOSIS — N184 Chronic kidney disease, stage 4 (severe): Secondary | ICD-10-CM

## 2019-01-05 ENCOUNTER — Ambulatory Visit
Admission: RE | Admit: 2019-01-05 | Discharge: 2019-01-05 | Disposition: A | Discharge status: Home / Self Care | Payer: Medicare Other | Source: Ambulatory Visit | Attending: Nephrology | Admitting: Nephrology

## 2019-01-05 DIAGNOSIS — N184 Chronic kidney disease, stage 4 (severe): Secondary | ICD-10-CM

## 2019-01-05 DIAGNOSIS — N189 Chronic kidney disease, unspecified: Secondary | ICD-10-CM | POA: Diagnosis not present

## 2019-01-07 DIAGNOSIS — I129 Hypertensive chronic kidney disease with stage 1 through stage 4 chronic kidney disease, or unspecified chronic kidney disease: Secondary | ICD-10-CM | POA: Diagnosis not present

## 2019-01-07 DIAGNOSIS — E875 Hyperkalemia: Secondary | ICD-10-CM | POA: Diagnosis not present

## 2019-01-07 DIAGNOSIS — Z905 Acquired absence of kidney: Secondary | ICD-10-CM | POA: Diagnosis not present

## 2019-01-07 DIAGNOSIS — N390 Urinary tract infection, site not specified: Secondary | ICD-10-CM | POA: Diagnosis not present

## 2019-01-07 DIAGNOSIS — N184 Chronic kidney disease, stage 4 (severe): Secondary | ICD-10-CM | POA: Diagnosis not present

## 2019-01-07 DIAGNOSIS — R809 Proteinuria, unspecified: Secondary | ICD-10-CM | POA: Diagnosis not present

## 2019-01-13 DIAGNOSIS — Z03818 Encounter for observation for suspected exposure to other biological agents ruled out: Secondary | ICD-10-CM | POA: Diagnosis not present

## 2019-01-19 DIAGNOSIS — I10 Essential (primary) hypertension: Secondary | ICD-10-CM | POA: Diagnosis not present

## 2019-01-19 DIAGNOSIS — E782 Mixed hyperlipidemia: Secondary | ICD-10-CM | POA: Diagnosis not present

## 2019-01-19 DIAGNOSIS — R7301 Impaired fasting glucose: Secondary | ICD-10-CM | POA: Diagnosis not present

## 2019-01-19 DIAGNOSIS — N184 Chronic kidney disease, stage 4 (severe): Secondary | ICD-10-CM | POA: Diagnosis not present

## 2019-01-19 DIAGNOSIS — G894 Chronic pain syndrome: Secondary | ICD-10-CM | POA: Diagnosis not present

## 2019-01-27 DIAGNOSIS — Z03818 Encounter for observation for suspected exposure to other biological agents ruled out: Secondary | ICD-10-CM | POA: Diagnosis not present

## 2019-01-31 DIAGNOSIS — N3020 Other chronic cystitis without hematuria: Secondary | ICD-10-CM | POA: Diagnosis not present

## 2019-01-31 DIAGNOSIS — N184 Chronic kidney disease, stage 4 (severe): Secondary | ICD-10-CM | POA: Diagnosis not present

## 2019-01-31 DIAGNOSIS — R3914 Feeling of incomplete bladder emptying: Secondary | ICD-10-CM | POA: Diagnosis not present

## 2019-02-05 ENCOUNTER — Other Ambulatory Visit: Payer: Self-pay | Admitting: Neurology

## 2019-02-05 DIAGNOSIS — G40009 Localization-related (focal) (partial) idiopathic epilepsy and epileptic syndromes with seizures of localized onset, not intractable, without status epilepticus: Secondary | ICD-10-CM

## 2019-02-08 DIAGNOSIS — J060 Acute laryngopharyngitis: Secondary | ICD-10-CM | POA: Diagnosis not present

## 2019-02-08 DIAGNOSIS — E782 Mixed hyperlipidemia: Secondary | ICD-10-CM | POA: Diagnosis not present

## 2019-02-08 DIAGNOSIS — I2510 Atherosclerotic heart disease of native coronary artery without angina pectoris: Secondary | ICD-10-CM | POA: Diagnosis not present

## 2019-02-08 DIAGNOSIS — N184 Chronic kidney disease, stage 4 (severe): Secondary | ICD-10-CM | POA: Diagnosis not present

## 2019-02-08 DIAGNOSIS — L03113 Cellulitis of right upper limb: Secondary | ICD-10-CM | POA: Diagnosis not present

## 2019-02-08 DIAGNOSIS — Z8669 Personal history of other diseases of the nervous system and sense organs: Secondary | ICD-10-CM | POA: Diagnosis not present

## 2019-02-08 DIAGNOSIS — E875 Hyperkalemia: Secondary | ICD-10-CM | POA: Diagnosis not present

## 2019-02-08 DIAGNOSIS — F5101 Primary insomnia: Secondary | ICD-10-CM | POA: Diagnosis not present

## 2019-02-08 DIAGNOSIS — M5430 Sciatica, unspecified side: Secondary | ICD-10-CM | POA: Diagnosis not present

## 2019-02-08 DIAGNOSIS — I10 Essential (primary) hypertension: Secondary | ICD-10-CM | POA: Diagnosis not present

## 2019-02-11 DIAGNOSIS — Z03818 Encounter for observation for suspected exposure to other biological agents ruled out: Secondary | ICD-10-CM | POA: Diagnosis not present

## 2019-02-16 DIAGNOSIS — E875 Hyperkalemia: Secondary | ICD-10-CM | POA: Diagnosis not present

## 2019-02-16 DIAGNOSIS — R809 Proteinuria, unspecified: Secondary | ICD-10-CM | POA: Diagnosis not present

## 2019-02-16 DIAGNOSIS — Z905 Acquired absence of kidney: Secondary | ICD-10-CM | POA: Diagnosis not present

## 2019-02-16 DIAGNOSIS — I129 Hypertensive chronic kidney disease with stage 1 through stage 4 chronic kidney disease, or unspecified chronic kidney disease: Secondary | ICD-10-CM | POA: Diagnosis not present

## 2019-02-16 DIAGNOSIS — N184 Chronic kidney disease, stage 4 (severe): Secondary | ICD-10-CM | POA: Diagnosis not present

## 2019-02-18 DIAGNOSIS — R269 Unspecified abnormalities of gait and mobility: Secondary | ICD-10-CM | POA: Diagnosis not present

## 2019-02-18 DIAGNOSIS — R278 Other lack of coordination: Secondary | ICD-10-CM | POA: Diagnosis not present

## 2019-02-18 DIAGNOSIS — M6281 Muscle weakness (generalized): Secondary | ICD-10-CM | POA: Diagnosis not present

## 2019-02-22 DIAGNOSIS — R269 Unspecified abnormalities of gait and mobility: Secondary | ICD-10-CM | POA: Diagnosis not present

## 2019-02-22 DIAGNOSIS — M6281 Muscle weakness (generalized): Secondary | ICD-10-CM | POA: Diagnosis not present

## 2019-02-22 DIAGNOSIS — R278 Other lack of coordination: Secondary | ICD-10-CM | POA: Diagnosis not present

## 2019-02-24 DIAGNOSIS — M6281 Muscle weakness (generalized): Secondary | ICD-10-CM | POA: Diagnosis not present

## 2019-02-24 DIAGNOSIS — R278 Other lack of coordination: Secondary | ICD-10-CM | POA: Diagnosis not present

## 2019-02-24 DIAGNOSIS — R269 Unspecified abnormalities of gait and mobility: Secondary | ICD-10-CM | POA: Diagnosis not present

## 2019-02-28 DIAGNOSIS — R3914 Feeling of incomplete bladder emptying: Secondary | ICD-10-CM | POA: Diagnosis not present

## 2019-02-28 DIAGNOSIS — N3020 Other chronic cystitis without hematuria: Secondary | ICD-10-CM | POA: Diagnosis not present

## 2019-03-01 DIAGNOSIS — K5900 Constipation, unspecified: Secondary | ICD-10-CM | POA: Diagnosis not present

## 2019-03-01 DIAGNOSIS — R278 Other lack of coordination: Secondary | ICD-10-CM | POA: Diagnosis not present

## 2019-03-01 DIAGNOSIS — R269 Unspecified abnormalities of gait and mobility: Secondary | ICD-10-CM | POA: Diagnosis not present

## 2019-03-01 DIAGNOSIS — G894 Chronic pain syndrome: Secondary | ICD-10-CM | POA: Diagnosis not present

## 2019-03-01 DIAGNOSIS — I10 Essential (primary) hypertension: Secondary | ICD-10-CM | POA: Diagnosis not present

## 2019-03-01 DIAGNOSIS — N184 Chronic kidney disease, stage 4 (severe): Secondary | ICD-10-CM | POA: Diagnosis not present

## 2019-03-01 DIAGNOSIS — M6281 Muscle weakness (generalized): Secondary | ICD-10-CM | POA: Diagnosis not present

## 2019-03-02 DIAGNOSIS — R278 Other lack of coordination: Secondary | ICD-10-CM | POA: Diagnosis not present

## 2019-03-02 DIAGNOSIS — M6281 Muscle weakness (generalized): Secondary | ICD-10-CM | POA: Diagnosis not present

## 2019-03-02 DIAGNOSIS — R269 Unspecified abnormalities of gait and mobility: Secondary | ICD-10-CM | POA: Diagnosis not present

## 2019-03-08 DIAGNOSIS — M6281 Muscle weakness (generalized): Secondary | ICD-10-CM | POA: Diagnosis not present

## 2019-03-08 DIAGNOSIS — R278 Other lack of coordination: Secondary | ICD-10-CM | POA: Diagnosis not present

## 2019-03-08 DIAGNOSIS — R269 Unspecified abnormalities of gait and mobility: Secondary | ICD-10-CM | POA: Diagnosis not present

## 2019-03-10 DIAGNOSIS — M6281 Muscle weakness (generalized): Secondary | ICD-10-CM | POA: Diagnosis not present

## 2019-03-10 DIAGNOSIS — R278 Other lack of coordination: Secondary | ICD-10-CM | POA: Diagnosis not present

## 2019-03-10 DIAGNOSIS — R269 Unspecified abnormalities of gait and mobility: Secondary | ICD-10-CM | POA: Diagnosis not present

## 2019-04-28 DIAGNOSIS — R278 Other lack of coordination: Secondary | ICD-10-CM | POA: Diagnosis not present

## 2019-04-28 DIAGNOSIS — M6281 Muscle weakness (generalized): Secondary | ICD-10-CM | POA: Diagnosis not present

## 2019-05-03 DIAGNOSIS — R278 Other lack of coordination: Secondary | ICD-10-CM | POA: Diagnosis not present

## 2019-05-03 DIAGNOSIS — M6281 Muscle weakness (generalized): Secondary | ICD-10-CM | POA: Diagnosis not present

## 2019-05-04 DIAGNOSIS — N184 Chronic kidney disease, stage 4 (severe): Secondary | ICD-10-CM | POA: Diagnosis not present

## 2019-05-05 ENCOUNTER — Other Ambulatory Visit: Payer: Self-pay | Admitting: Neurology

## 2019-05-05 DIAGNOSIS — G40009 Localization-related (focal) (partial) idiopathic epilepsy and epileptic syndromes with seizures of localized onset, not intractable, without status epilepticus: Secondary | ICD-10-CM

## 2019-05-05 DIAGNOSIS — R278 Other lack of coordination: Secondary | ICD-10-CM | POA: Diagnosis not present

## 2019-05-05 DIAGNOSIS — M6281 Muscle weakness (generalized): Secondary | ICD-10-CM | POA: Diagnosis not present

## 2019-05-10 DIAGNOSIS — R278 Other lack of coordination: Secondary | ICD-10-CM | POA: Diagnosis not present

## 2019-05-10 DIAGNOSIS — M6281 Muscle weakness (generalized): Secondary | ICD-10-CM | POA: Diagnosis not present

## 2019-05-11 DIAGNOSIS — I129 Hypertensive chronic kidney disease with stage 1 through stage 4 chronic kidney disease, or unspecified chronic kidney disease: Secondary | ICD-10-CM | POA: Diagnosis not present

## 2019-05-11 DIAGNOSIS — N184 Chronic kidney disease, stage 4 (severe): Secondary | ICD-10-CM | POA: Diagnosis not present

## 2019-05-11 DIAGNOSIS — E875 Hyperkalemia: Secondary | ICD-10-CM | POA: Diagnosis not present

## 2019-05-11 DIAGNOSIS — R809 Proteinuria, unspecified: Secondary | ICD-10-CM | POA: Diagnosis not present

## 2019-05-11 DIAGNOSIS — Z905 Acquired absence of kidney: Secondary | ICD-10-CM | POA: Diagnosis not present

## 2019-05-12 DIAGNOSIS — M6281 Muscle weakness (generalized): Secondary | ICD-10-CM | POA: Diagnosis not present

## 2019-05-12 DIAGNOSIS — R278 Other lack of coordination: Secondary | ICD-10-CM | POA: Diagnosis not present

## 2019-05-17 DIAGNOSIS — M6281 Muscle weakness (generalized): Secondary | ICD-10-CM | POA: Diagnosis not present

## 2019-05-17 DIAGNOSIS — R278 Other lack of coordination: Secondary | ICD-10-CM | POA: Diagnosis not present

## 2019-05-19 DIAGNOSIS — R278 Other lack of coordination: Secondary | ICD-10-CM | POA: Diagnosis not present

## 2019-05-19 DIAGNOSIS — M6281 Muscle weakness (generalized): Secondary | ICD-10-CM | POA: Diagnosis not present

## 2019-05-24 DIAGNOSIS — R278 Other lack of coordination: Secondary | ICD-10-CM | POA: Diagnosis not present

## 2019-05-24 DIAGNOSIS — M6281 Muscle weakness (generalized): Secondary | ICD-10-CM | POA: Diagnosis not present

## 2019-05-25 ENCOUNTER — Emergency Department (HOSPITAL_COMMUNITY)
Admission: EM | Admit: 2019-05-25 | Discharge: 2019-05-25 | Disposition: A | Discharge status: Home / Self Care | Payer: Medicare Other | Attending: Emergency Medicine | Admitting: Emergency Medicine

## 2019-05-25 ENCOUNTER — Encounter (HOSPITAL_COMMUNITY): Payer: Self-pay | Admitting: Family Medicine

## 2019-05-25 ENCOUNTER — Emergency Department (HOSPITAL_COMMUNITY): Payer: Medicare Other

## 2019-05-25 DIAGNOSIS — T07XXXA Unspecified multiple injuries, initial encounter: Secondary | ICD-10-CM | POA: Diagnosis not present

## 2019-05-25 DIAGNOSIS — S0990XA Unspecified injury of head, initial encounter: Secondary | ICD-10-CM | POA: Diagnosis not present

## 2019-05-25 DIAGNOSIS — Z79899 Other long term (current) drug therapy: Secondary | ICD-10-CM | POA: Diagnosis not present

## 2019-05-25 DIAGNOSIS — M25552 Pain in left hip: Secondary | ICD-10-CM | POA: Diagnosis not present

## 2019-05-25 DIAGNOSIS — I10 Essential (primary) hypertension: Secondary | ICD-10-CM | POA: Diagnosis not present

## 2019-05-25 DIAGNOSIS — Y999 Unspecified external cause status: Secondary | ICD-10-CM | POA: Diagnosis not present

## 2019-05-25 DIAGNOSIS — R52 Pain, unspecified: Secondary | ICD-10-CM | POA: Diagnosis not present

## 2019-05-25 DIAGNOSIS — M25572 Pain in left ankle and joints of left foot: Secondary | ICD-10-CM | POA: Insufficient documentation

## 2019-05-25 DIAGNOSIS — M25551 Pain in right hip: Secondary | ICD-10-CM | POA: Insufficient documentation

## 2019-05-25 DIAGNOSIS — N184 Chronic kidney disease, stage 4 (severe): Secondary | ICD-10-CM | POA: Diagnosis not present

## 2019-05-25 DIAGNOSIS — I129 Hypertensive chronic kidney disease with stage 1 through stage 4 chronic kidney disease, or unspecified chronic kidney disease: Secondary | ICD-10-CM | POA: Insufficient documentation

## 2019-05-25 DIAGNOSIS — M79662 Pain in left lower leg: Secondary | ICD-10-CM | POA: Insufficient documentation

## 2019-05-25 DIAGNOSIS — G40909 Epilepsy, unspecified, not intractable, without status epilepticus: Secondary | ICD-10-CM | POA: Diagnosis not present

## 2019-05-25 DIAGNOSIS — Z743 Need for continuous supervision: Secondary | ICD-10-CM | POA: Diagnosis not present

## 2019-05-25 DIAGNOSIS — W19XXXA Unspecified fall, initial encounter: Secondary | ICD-10-CM | POA: Insufficient documentation

## 2019-05-25 DIAGNOSIS — Y92129 Unspecified place in nursing home as the place of occurrence of the external cause: Secondary | ICD-10-CM | POA: Insufficient documentation

## 2019-05-25 DIAGNOSIS — S99922A Unspecified injury of left foot, initial encounter: Secondary | ICD-10-CM | POA: Diagnosis present

## 2019-05-25 DIAGNOSIS — S92355A Nondisplaced fracture of fifth metatarsal bone, left foot, initial encounter for closed fracture: Secondary | ICD-10-CM | POA: Diagnosis not present

## 2019-05-25 DIAGNOSIS — Y9389 Activity, other specified: Secondary | ICD-10-CM | POA: Diagnosis not present

## 2019-05-25 HISTORY — DX: Unspecified abnormalities of gait and mobility: R26.9

## 2019-05-25 HISTORY — DX: Insomnia, unspecified: G47.00

## 2019-05-25 HISTORY — DX: Iron deficiency: E61.1

## 2019-05-25 HISTORY — DX: Weakness: R53.1

## 2019-05-25 HISTORY — DX: Hypokalemia: E87.6

## 2019-05-25 HISTORY — DX: Unspecified macular degeneration: H35.30

## 2019-05-25 HISTORY — DX: Dysphagia, unspecified: R13.10

## 2019-05-25 HISTORY — DX: Other chronic pain: G89.29

## 2019-05-25 MED ORDER — ACETAMINOPHEN 500 MG PO TABS
1000.0000 mg | ORAL_TABLET | Freq: Once | ORAL | Status: AC
  Administered 2019-05-25: 16:00:00 1000 mg via ORAL
  Filled 2019-05-25: qty 2

## 2019-05-25 NOTE — ED Notes (Signed)
Ngozi, RN assisted patient to the restroom room. This Probation officer and Ngozi, RN assisted patient partially back to the stretcher until she felt she was getting weak. We were able to get patient on a stretcher prior to falling.

## 2019-05-25 NOTE — Discharge Instructions (Signed)
I have provided you information to follow-up with an orthopedic surgeon in the office to have them evaluate your fracture and ensure they do not feel that repair needs to be required.  Otherwise try and keep your weight off of it as much as you can.  We have placed you in a boot to provide some support. Take Tylenol for your pain.

## 2019-05-25 NOTE — ED Triage Notes (Signed)
Patient is from [facility] and transported via [employer]. Patient had a fall 2 days ago. Her left leg gave out and landed on her left side. Patient is complaining of left ankle/hip pain, brusied left eye and upper left thigh. Patient has been able to bear weight, but sent out for x-rays.

## 2019-05-25 NOTE — ED Provider Notes (Signed)
[facility] DEPT Provider Note   CSN: [id number] Arrival date & time: 05/25/19  1415     History Chief Complaint  Patient presents with  . Fall    [name] [name] is a 86 y.o. female.  86 yo F with a chief complaint of a fall.  This occurred a couple days ago.  Sounds nonsyncopal.  Patient without complaint.  Said that the nursing facility made her come.  She is complaining mostly of left ankle foot and hip pain.  She denies any change in her vision denies headache denies neck pain.  Denies chest pain or abdominal pain.  Denies shortness of breath.  The history is provided by the patient.  Fall Pertinent negatives include no chest pain, no headaches and no shortness of breath.  Illness Severity:  Moderate Onset quality:  Gradual Duration:  2 days Timing:  Constant Progression:  Worsening Chronicity:  New Associated symptoms: myalgias   Associated symptoms: no chest pain, no congestion, no fever, no headaches, no nausea, no rhinorrhea, no shortness of breath, no vomiting and no wheezing        Past Medical History:  Diagnosis Date  . Abnormal gait   . Asthenia   . Back pain   . Bladder cancer ([place])   . CAD (coronary artery disease)   . Chronic pain   . CKD (chronic kidney disease), stage IV ([place])   . Dizziness   . Dysphagia   . 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 Essential hypertension, benign   . Hypokalemia   . Insomnia   . Iron deficiency   . Macular degeneration   . Mixed hyperlipidemia   . Obesity   . Postsurgical aortocoronary bypass status     Patient Active Problem List   Diagnosis Date Noted  . Traumatic subdural hematoma ([place]) 08/08/2018  . Pyelonephritis 09/09/2016  . Falls frequently 06/24/2016  . Tension headache 11/10/2013  . Chest pain 11/03/2011  . ARF (acute renal failure) ([place]) 04/30/2011  . UTI (urinary tract infection) 04/30/2011  . CKD (chronic kidney disease), stage IV ([place]) 04/30/2011  . Anemia  04/30/2011  . S/p nephrectomy 04/30/2011  . Macular degeneration 04/30/2011  . Cataract 04/30/2011  . Bladder cancer ([place]) 04/30/2011  . Weakness generalized 04/29/2011  . Seizure disorder ([place]) 04/29/2011  . Benign hypertension 04/29/2011  . CAD (coronary artery disease) 04/29/2011  . Hyperlipidemia 04/29/2011  . Anxiety 04/29/2011  . Dizziness - light-headed 04/29/2011  . Chronic abdominal pain 04/29/2011  . Mixed hyperlipidemia 05/24/2008  . Essential hypertension, benign 05/24/2008  . Postsurgical aortocoronary bypass status 05/24/2008    Past Surgical History:  Procedure Laterality Date  . CATARACT EXTRACTION    . CORONARY ARTERY BYPASS GRAFT    . IRIDECTOMY    . NEPHRECTOMY  2000     OB History    Gravida  3   Para  3   Term  3   Preterm      AB      Living  3     SAB      TAB      Ectopic      Multiple      Live Births              Family History  Problem Relation Age of Onset  . Colon cancer Mother   . Heart attack Father   . Diabetes Father   . Stroke Other   . Coronary artery disease Other   .  Cancer Other   . Diabetes Other     Social History   Tobacco Use  . Smoking status: Never Smoker  . Smokeless tobacco: Never Used  Substance Use Topics  . Alcohol use: No  . Drug use: No    Home Medications Prior to Admission medications   Medication Sig Start Date End Date Taking? Authorizing Provider  amLODipine (NORVASC) 2.5 MG tablet Take 2.5 mg by mouth daily. 10/29/17   [provider]  BELSOMRA 20 MG TABS Take 20 mg by mouth at bedtime.  12/15/14   [provider]  clonazePAM (KLONOPIN) 0.5 MG tablet Take 1 tablet (0.5 mg total) by mouth at bedtime. 08/10/18   Domenic Polite, MD  Cranberry 250 MG TABS Take 250 mg by mouth daily.     [provider]  gabapentin (NEURONTIN) 300 MG capsule Take 1 capsule (300 mg total) by mouth at bedtime. 08/10/18   Domenic Polite, MD  HYDROcodone-acetaminophen [facility])  10-325 MG tablet Take 1 tablet by mouth every 6 (six) hours as needed for moderate pain or severe pain. 08/10/18   Domenic Polite, MD  levETIRAcetam (KEPPRA) 250 MG tablet Take 250 mg by mouth 2 (two) times daily. Manage now by her primary doctor Dr Edwinna Areola hall pt pick up refill 05/13/2019    [provider]  LINZESS 145 MCG CAPS capsule Take 145 mcg by mouth at bedtime.  12/18/14   [provider]  Magnesium 250 MG TABS Take 250 mg by mouth daily.     [provider]  Multiple Vitamins-Minerals (CENTRUM ULTRA WOMENS) TABS Take by mouth.    [provider]  Multiple Vitamins-Minerals (PRESERVISION AREDS PO) Take 1 capsule by mouth daily.    [provider]  polyethylene glycol (MIRALAX / GLYCOLAX) packet Take 17 g by mouth daily as needed for moderate constipation.    [provider]  Pumpkin Seed-Soy Germ (AZO BLADDER CONTROL/GO-LESS) CAPS Take by mouth.    [provider]  rosuvastatin (CRESTOR) 10 MG tablet Take 10 mg by mouth daily.  01/06/11   [provider]  torsemide (DEMADEX) 10 MG tablet Take 10 mg by mouth every other day.    [provider]    Allergies    Adhesive [tape], Keflet [cephalexin], Latex, Penicillins, and Sulfa antibiotics  Review of Systems   Review of Systems  Constitutional: Negative for chills and fever.  HENT: Negative for congestion and rhinorrhea.   Eyes: Negative for redness and visual disturbance.  Respiratory: Negative for shortness of breath and wheezing.   Cardiovascular: Negative for chest pain and palpitations.  Gastrointestinal: Negative for nausea and vomiting.  Genitourinary: Negative for dysuria and urgency.  Musculoskeletal: Positive for arthralgias and myalgias.  Skin: Negative for pallor and wound.  Neurological: Negative for dizziness and headaches.    Physical Exam Updated Vital Signs BP (!) 168/72 (BP Location: Right Arm)   Pulse 73   Temp 98.9 F (37.2 C)  (Oral)   Resp 18   SpO2 98%   Physical Exam Vitals and nursing note reviewed.  Constitutional:      General: She is not in acute distress.    Appearance: She is well-developed. She is not diaphoretic.  HENT:     Head: Normocephalic.     Comments: Bruising around the left eye.  No facial nerve palsy. Eyes:     Pupils: Pupils are equal, round, and reactive to light.  Cardiovascular:     Rate and Rhythm: Normal rate and  regular rhythm.     Heart sounds: No murmur. No friction rub. No gallop.   Pulmonary:     Effort: Pulmonary effort is normal.     Breath sounds: No wheezing or rales.  Abdominal:     General: There is no distension.     Palpations: Abdomen is soft.     Tenderness: There is no abdominal tenderness.  Musculoskeletal:        General: Tenderness present.     Cervical back: Normal range of motion and neck supple.     Comments: Mild diffuse to the left lower extremity.  No obvious edema.  No crepitus.  Internal and external rotation of bilateral hips without pain.  No midline spinal tenderness.  Able to rotate her head 45 degrees in either direction without pain.  Full range of motion of both upper extremities without any palpable tenderness.  Mild tenderness with compression of the pelvis.  Skin:    General: Skin is warm and dry.  Neurological:     Mental Status: She is alert and oriented to person, place, and time.  Psychiatric:        Behavior: Behavior normal.     ED Results / Procedures / Treatments   Labs (all labs ordered are listed, but only abnormal results are displayed) Labs Reviewed - No data to display  EKG None  Radiology DG Tibia/Fibula Left  Result Date: 05/25/2019 CLINICAL DATA:  Fall 2 days prior with left leg pain EXAM: LEFT TIBIA AND FIBULA - 2 VIEW COMPARISON:  None. FINDINGS: Partially visualized nondisplaced acute distal left fifth metatarsal fracture. No fracture in the left tibia or fibula. No dislocation at the left knee or left ankle  on these views. No focal osseous lesions. No radiopaque foreign bodies. IMPRESSION: Partially visualized nondisplaced acute distal left fifth metatarsal fracture, please see separate concurrent left foot radiograph report for details. No left tibia or fibula fracture. Electronically Signed   By: Ilona Sorrel M.D.   On: 05/25/2019 16:30   DG Ankle Complete Left  Result Date: 05/25/2019 CLINICAL DATA:  Fall 2 days prior with left ankle pain EXAM: LEFT ANKLE COMPLETE - 3+ VIEW COMPARISON:  None. FINDINGS: Acute nondisplaced distal left fifth metatarsal fracture. No left ankle fracture or subluxation. No focal osseous lesions. No significant arthropathy. Small Achilles and plantar left calcaneal spurs. No radiopaque foreign body. IMPRESSION: Acute nondisplaced distal left fifth metatarsal fracture, please see separate concurrent left foot radiograph report for details. No left ankle fracture or subluxation. Electronically Signed   By: Ilona Sorrel M.D.   On: 05/25/2019 16:28   CT Head Wo Contrast  Result Date: 05/25/2019 CLINICAL DATA:  Head trauma, minor. Additional history provided by scanning technologist: Fall 2 days ago, landed on left side. EXAM: CT HEAD WITHOUT CONTRAST TECHNIQUE: Contiguous axial images were obtained from the base of the skull through the vertex without intravenous contrast. COMPARISON:  Head CT 08/08/2010 FINDINGS: Brain: There is no evidence of acute intracranial hemorrhage. No acute demarcated cortical infarction. No midline shift or extra-axial fluid collection. Again demonstrated is a small focus of encephalomalacia within the posterior right frontal lobe deep to a right parietal cranioplasty. Redemonstrated chronic lacunar infarct within the right corona radiata/basal ganglia. Background mild ill-defined hypoattenuation within the cerebral white matter is nonspecific, but consistent with chronic small vessel ischemic disease. Stable, moderate generalized parenchymal atrophy.  Vascular: No hyperdense vessel.  Atherosclerotic calcifications Skull: No calvarial fracture. Right parietal cranioplasty. Sinuses/Orbits: Visualized orbits demonstrate no acute  abnormality. Minimal ethmoid sinus mucosal thickening at the imaged levels. No significant mastoid effusion. IMPRESSION: No evidence of acute intracranial abnormality. Redemonstrated chronic right corona radiata/basal ganglia lacunar infarct. Unchanged background mild chronic small vessel ischemic disease and moderate generalized parenchymal atrophy. Additional chronic findings as described and unchanged. Electronically Signed   By: Kellie Simmering DO   On: 05/25/2019 16:22   DG Foot Complete Left  Result Date: 05/25/2019 CLINICAL DATA:  Fall 2 days prior with left ankle and foot pain EXAM: LEFT FOOT - COMPLETE 3+ VIEW COMPARISON:  None. FINDINGS: Nondisplaced acute non articular distal metaphysis left fifth metatarsal fracture with slight apex lateral angulation. No additional fracture. No dislocation. No focal osseous lesions. No radiopaque foreign body. No significant arthropathy. Small Achilles and plantar left calcaneal spurs. IMPRESSION: Nondisplaced non articular distal left fifth metatarsal fracture. Electronically Signed   By: Ilona Sorrel M.D.   On: 05/25/2019 16:27   DG Hips Bilat W or Wo Pelvis 3-4 Views  Result Date: 05/25/2019 CLINICAL DATA:  86 year old female with bilateral hip pain. EXAM: DG HIP (WITH OR WITHOUT PELVIS) 3-4V BILAT COMPARISON:  Left hip radiograph dated 12/18/2015. FINDINGS: There is no acute fracture or dislocation. The bones are osteopenic. Mild bilateral hip osteoarthritic changes. Old bilateral pubic rami fractures as seen previously. There is degenerative changes of the sacroiliac joints bilaterally. The soft tissues are unremarkable. IMPRESSION: 1. No acute fracture or dislocation. 2. Osteopenia with old pubic rami fractures as seen previously. 3. Degenerative changes of the SI joints.  Electronically Signed   By: Anner Crete M.D.   On: 05/25/2019 16:24    Procedures Procedures (including critical care time)  Medications Ordered in ED Medications  acetaminophen (TYLENOL) tablet 1,000 mg (1,000 mg Oral Given 05/25/19 1621)    ED Course  I have reviewed the triage vital signs and the nursing notes.  Pertinent labs & imaging results that were available during my care of the patient were reviewed by me and considered in my medical decision making (see chart for details).    MDM Rules/Calculators/A&P                      86 yo F with a chief complaint of a fall.  Sounds nonsyncopal by history.  Complaining of diffuse pains.  Will obtain plain films of the left lower extremity CT of the head and reassess.  Patient does have what appears to be an acute metatarsal neck fracture of the left fifth metatarsal.  She does have some pain at that site.  Will place in a boot.  Given orthopedic follow-up.  4:55 PM:  I have discussed the diagnosis/risks/treatment options with the patient and believe the pt to be eligible for discharge home to follow-up with PCP. We also discussed returning to the ED immediately if new or worsening sx occur. We discussed the sx which are most concerning (e.g., sudden worsening pain, fever, inability to tolerate by mouth) that necessitate immediate return. Medications administered to the patient during their visit and any new prescriptions provided to the patient are listed below.  Medications given during this visit Medications  acetaminophen (TYLENOL) tablet 1,000 mg (1,000 mg Oral Given 05/25/19 1621)     The patient appears reasonably screen and/or stabilized for discharge and I doubt any other medical condition or other [facility] requiring further screening, evaluation, or treatment in the ED at this time prior to discharge.     Final Clinical Impression(s) / ED Diagnoses Final diagnoses:  Nondisplaced fracture of fifth metatarsal bone, left  foot, initial encounter for closed fracture    Rx / DC Orders ED Discharge Orders    None       Deno Etienne, DO 05/25/19 1655

## 2019-05-25 NOTE — ED Notes (Signed)
Patient transporting to radiology.

## 2019-05-31 DIAGNOSIS — M6281 Muscle weakness (generalized): Secondary | ICD-10-CM | POA: Diagnosis not present

## 2019-05-31 DIAGNOSIS — R278 Other lack of coordination: Secondary | ICD-10-CM | POA: Diagnosis not present

## 2019-06-01 DIAGNOSIS — F5101 Primary insomnia: Secondary | ICD-10-CM | POA: Diagnosis not present

## 2019-06-01 DIAGNOSIS — L03113 Cellulitis of right upper limb: Secondary | ICD-10-CM | POA: Diagnosis not present

## 2019-06-01 DIAGNOSIS — E875 Hyperkalemia: Secondary | ICD-10-CM | POA: Diagnosis not present

## 2019-06-01 DIAGNOSIS — N184 Chronic kidney disease, stage 4 (severe): Secondary | ICD-10-CM | POA: Diagnosis not present

## 2019-06-01 DIAGNOSIS — E782 Mixed hyperlipidemia: Secondary | ICD-10-CM | POA: Diagnosis not present

## 2019-06-01 DIAGNOSIS — J060 Acute laryngopharyngitis: Secondary | ICD-10-CM | POA: Diagnosis not present

## 2019-06-01 DIAGNOSIS — Z0001 Encounter for general adult medical examination with abnormal findings: Secondary | ICD-10-CM | POA: Diagnosis not present

## 2019-06-01 DIAGNOSIS — N183 Chronic kidney disease, stage 3 unspecified: Secondary | ICD-10-CM | POA: Diagnosis not present

## 2019-06-01 DIAGNOSIS — I10 Essential (primary) hypertension: Secondary | ICD-10-CM | POA: Diagnosis not present

## 2019-06-01 DIAGNOSIS — R7301 Impaired fasting glucose: Secondary | ICD-10-CM | POA: Diagnosis not present

## 2019-06-01 DIAGNOSIS — M5430 Sciatica, unspecified side: Secondary | ICD-10-CM | POA: Diagnosis not present

## 2019-06-02 DIAGNOSIS — N183 Chronic kidney disease, stage 3 unspecified: Secondary | ICD-10-CM | POA: Diagnosis not present

## 2019-06-07 DIAGNOSIS — N183 Chronic kidney disease, stage 3 unspecified: Secondary | ICD-10-CM | POA: Diagnosis not present

## 2019-06-08 DIAGNOSIS — M25572 Pain in left ankle and joints of left foot: Secondary | ICD-10-CM | POA: Diagnosis not present

## 2019-06-10 DIAGNOSIS — N183 Chronic kidney disease, stage 3 unspecified: Secondary | ICD-10-CM | POA: Diagnosis not present

## 2019-06-13 DIAGNOSIS — N183 Chronic kidney disease, stage 3 unspecified: Secondary | ICD-10-CM | POA: Diagnosis not present

## 2019-06-16 DIAGNOSIS — N183 Chronic kidney disease, stage 3 unspecified: Secondary | ICD-10-CM | POA: Diagnosis not present

## 2019-06-20 DIAGNOSIS — N183 Chronic kidney disease, stage 3 unspecified: Secondary | ICD-10-CM | POA: Diagnosis not present

## 2019-06-27 DIAGNOSIS — N183 Chronic kidney disease, stage 3 unspecified: Secondary | ICD-10-CM | POA: Diagnosis not present

## 2019-06-28 DIAGNOSIS — N183 Chronic kidney disease, stage 3 unspecified: Secondary | ICD-10-CM | POA: Diagnosis not present

## 2019-07-01 DIAGNOSIS — N183 Chronic kidney disease, stage 3 unspecified: Secondary | ICD-10-CM | POA: Diagnosis not present

## 2019-08-09 ENCOUNTER — Encounter (HOSPITAL_COMMUNITY): Payer: Self-pay | Admitting: Emergency Medicine

## 2019-08-09 ENCOUNTER — Emergency Department (HOSPITAL_COMMUNITY)
Admission: EM | Admit: 2019-08-09 | Discharge: 2019-08-09 | Disposition: A | Discharge status: Home / Self Care | Payer: Medicare Other | Attending: Emergency Medicine | Admitting: Emergency Medicine

## 2019-08-09 ENCOUNTER — Emergency Department (HOSPITAL_COMMUNITY): Payer: Medicare Other

## 2019-08-09 ENCOUNTER — Other Ambulatory Visit: Payer: Self-pay

## 2019-08-09 DIAGNOSIS — Z91040 Latex allergy status: Secondary | ICD-10-CM | POA: Diagnosis not present

## 2019-08-09 DIAGNOSIS — S0993XA Unspecified injury of face, initial encounter: Secondary | ICD-10-CM | POA: Diagnosis present

## 2019-08-09 DIAGNOSIS — W1812XA Fall from or off toilet with subsequent striking against object, initial encounter: Secondary | ICD-10-CM | POA: Diagnosis not present

## 2019-08-09 DIAGNOSIS — Y999 Unspecified external cause status: Secondary | ICD-10-CM | POA: Insufficient documentation

## 2019-08-09 DIAGNOSIS — Z8052 Family history of malignant neoplasm of bladder: Secondary | ICD-10-CM | POA: Diagnosis not present

## 2019-08-09 DIAGNOSIS — W19XXXA Unspecified fall, initial encounter: Secondary | ICD-10-CM

## 2019-08-09 DIAGNOSIS — Y92002 Bathroom of unspecified non-institutional (private) residence single-family (private) house as the place of occurrence of the external cause: Secondary | ICD-10-CM | POA: Insufficient documentation

## 2019-08-09 DIAGNOSIS — R42 Dizziness and giddiness: Secondary | ICD-10-CM | POA: Insufficient documentation

## 2019-08-09 DIAGNOSIS — I129 Hypertensive chronic kidney disease with stage 1 through stage 4 chronic kidney disease, or unspecified chronic kidney disease: Secondary | ICD-10-CM | POA: Diagnosis not present

## 2019-08-09 DIAGNOSIS — Z79899 Other long term (current) drug therapy: Secondary | ICD-10-CM | POA: Insufficient documentation

## 2019-08-09 DIAGNOSIS — S0083XA Contusion of other part of head, initial encounter: Secondary | ICD-10-CM | POA: Diagnosis not present

## 2019-08-09 DIAGNOSIS — Y9389 Activity, other specified: Secondary | ICD-10-CM | POA: Diagnosis not present

## 2019-08-09 DIAGNOSIS — N184 Chronic kidney disease, stage 4 (severe): Secondary | ICD-10-CM | POA: Diagnosis not present

## 2019-08-09 NOTE — ED Notes (Signed)
[facility] called for transport.  

## 2019-08-09 NOTE — ED Triage Notes (Signed)
Pt arrived via EMS. EMS reports that pt was sitting on the toilet and tried to stand, and she fell to the ground. Pt had no loss of consciousness, no pain. Pt is on no blood thinners. Pt has a minor skin tear under her eye.

## 2019-08-09 NOTE — ED Provider Notes (Signed)
[facility] DEPT Provider Note   CSN: [id number] Arrival date & time: 08/09/19  0210   History Chief Complaint  Patient presents with  . Fall    [name] [name] is a 86 y.o. female.  The history is provided by the patient.  Fall  She has history of hypertension, hyperlipidemia, coronary artery disease, chronic kidney disease and states that she fell while getting on the commode at home.  She states that the toilet seat moves and, when she sat down, the seat moved and she fell striking her left periorbital area.  She denies loss of consciousness and denies any neck pain.  She specifically denies back, chest, abdomen or extremity injury.  She is not on any anticoagulants or antiantiplatelet agents.  Past Medical History:  Diagnosis Date  . Abnormal gait   . Asthenia   . Back pain   . Bladder cancer ([place])   . CAD (coronary artery disease)   . Chronic pain   . CKD (chronic kidney disease), stage IV ([place])   . Dizziness   . Dysphagia   . 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 Essential hypertension, benign   . Hypokalemia   . Insomnia   . Iron deficiency   . Macular degeneration   . Mixed hyperlipidemia   . Obesity   . Postsurgical aortocoronary bypass status     Patient Active Problem List   Diagnosis Date Noted  . Traumatic subdural hematoma ([place]) 08/08/2018  . Pyelonephritis 09/09/2016  . Falls frequently 06/24/2016  . Tension headache 11/10/2013  . Chest pain 11/03/2011  . ARF (acute renal failure) ([place]) 04/30/2011  . UTI (urinary tract infection) 04/30/2011  . CKD (chronic kidney disease), stage IV ([place]) 04/30/2011  . Anemia 04/30/2011  . S/p nephrectomy 04/30/2011  . Macular degeneration 04/30/2011  . Cataract 04/30/2011  . Bladder cancer ([place]) 04/30/2011  . Weakness generalized 04/29/2011  . Seizure disorder ([place]) 04/29/2011  . Benign hypertension 04/29/2011  . CAD (coronary artery disease) 04/29/2011  . Hyperlipidemia 04/29/2011   . Anxiety 04/29/2011  . Dizziness - light-headed 04/29/2011  . Chronic abdominal pain 04/29/2011  . Mixed hyperlipidemia 05/24/2008  . Essential hypertension, benign 05/24/2008  . Postsurgical aortocoronary bypass status 05/24/2008    Past Surgical History:  Procedure Laterality Date  . CATARACT EXTRACTION    . CORONARY ARTERY BYPASS GRAFT    . IRIDECTOMY    . NEPHRECTOMY  2000     OB History    Gravida  3   Para  3   Term  3   Preterm      AB      Living  3     SAB      TAB      Ectopic      Multiple      Live Births              Family History  Problem Relation Age of Onset  . Colon cancer Mother   . Heart attack Father   . Diabetes Father   . Stroke Other   . Coronary artery disease Other   . Cancer Other   . Diabetes Other     Social History   Tobacco Use  . Smoking status: Never Smoker  . Smokeless tobacco: Never Used  Substance Use Topics  . Alcohol use: No  . Drug use: No    Home Medications Prior to Admission medications   Medication Sig Start Date End Date  Taking? Authorizing Provider  amLODipine (NORVASC) 2.5 MG tablet Take 2.5 mg by mouth daily. 10/29/17   [provider]  BELSOMRA 20 MG TABS Take 20 mg by mouth at bedtime.  12/15/14   [provider]  clonazePAM (KLONOPIN) 0.5 MG tablet Take 1 tablet (0.5 mg total) by mouth at bedtime. 08/10/18   Domenic Polite, MD  Cranberry 250 MG TABS Take 250 mg by mouth daily.     [provider]  gabapentin (NEURONTIN) 300 MG capsule Take 1 capsule (300 mg total) by mouth at bedtime. 08/10/18   Domenic Polite, MD  HYDROcodone-acetaminophen [facility]) 10-325 MG tablet Take 1 tablet by mouth every 6 (six) hours as needed for moderate pain or severe pain. 08/10/18   Domenic Polite, MD  levETIRAcetam (KEPPRA) 250 MG tablet Take 250 mg by mouth 2 (two) times daily. Manage now by her primary doctor Dr Edwinna Areola hall pt pick up refill 05/13/2019    [provider]  LINZESS  145 MCG CAPS capsule Take 145 mcg by mouth at bedtime.  12/18/14   [provider]  Magnesium 250 MG TABS Take 250 mg by mouth daily.     [provider]  Multiple Vitamins-Minerals (CENTRUM ULTRA WOMENS) TABS Take by mouth.    [provider]  Multiple Vitamins-Minerals (PRESERVISION AREDS PO) Take 1 capsule by mouth daily.    [provider]  polyethylene glycol (MIRALAX / GLYCOLAX) packet Take 17 g by mouth daily as needed for moderate constipation.    [provider]  Pumpkin Seed-Soy Germ (AZO BLADDER CONTROL/GO-LESS) CAPS Take by mouth.    [provider]  rosuvastatin (CRESTOR) 10 MG tablet Take 10 mg by mouth daily.  01/06/11   [provider]  torsemide (DEMADEX) 10 MG tablet Take 10 mg by mouth every other day.    [provider]    Allergies    Adhesive [tape], Keflet [cephalexin], Latex, Penicillins, and Sulfa antibiotics  Review of Systems   Review of Systems  All other systems reviewed and are negative.   Physical Exam Updated Vital Signs BP (!) 168/72 (BP Location: Left Arm)   Pulse 65   Temp 98 F (36.7 C) (Oral)   Resp 17   Ht 5\' 2"  (1.575 m)   Wt 79.8 kg   SpO2 97%   BMI 32.19 kg/m   Physical Exam Vitals and nursing note reviewed.   86 year old female, resting comfortably and in no acute distress. Vital signs are significant for elevated blood pressure. Oxygen saturation is 97%, which is normal. Head is normocephalic.  There is mild swelling and ecchymosis in the left infraorbital area. PERRLA, EOMI. Oropharynx is clear. Neck is nontender without adenopathy or JVD. Back is nontender and there is no CVA tenderness. Lungs are clear without rales, wheezes, or rhonchi. Chest is nontender. Heart has regular rate and rhythm with 2/6 holosystolic murmur along the left sternal border. Abdomen is soft, flat, nontender without masses or hepatosplenomegaly and peristalsis is normoactive. Extremities  have no cyanosis or edema, full range of motion is present. Skin is warm and dry without rash. Neurologic: Mental status is normal, cranial nerves are intact, there are no motor or sensory deficits.  ED Results / Procedures / Treatments    Radiology CT Head Wo Contrast  Result Date: 08/09/2019 CLINICAL DATA:  86 year old female with facial trauma. EXAM: CT HEAD WITHOUT CONTRAST CT MAXILLOFACIAL WITHOUT CONTRAST CT CERVICAL SPINE WITHOUT CONTRAST TECHNIQUE: Multidetector CT imaging of the head,  cervical spine, and maxillofacial structures were performed using the standard protocol without intravenous contrast. Multiplanar CT image reconstructions of the cervical spine and maxillofacial structures were also generated. COMPARISON:  Head CT dated 05/25/2019. FINDINGS: CT HEAD FINDINGS Brain: Moderate age-related atrophy and chronic microvascular ischemic changes. There is no acute intracranial hemorrhage. No mass effect or midline shift. No extra-axial fluid collection. Vascular: No hyperdense vessel or unexpected calcification. Skull: No acute calvarial pathology. Right frontoparietal craniotomy. Other: None. CT MAXILLOFACIAL FINDINGS Osseous: No acute fracture. No mandibular subluxation. Orbits: The globes and retro-orbital fat are preserved. Sinuses: Clear. Soft tissues: Negative. CT CERVICAL SPINE FINDINGS Alignment: No acute subluxation. Skull base and vertebrae: No acute fracture. Osteopenia. Soft tissues and spinal canal: No prevertebral fluid or swelling. No visible canal hematoma. Disc levels:  Degenerative changes most prominent at C5-C6. Upper chest: Negative. Other: Bilateral carotid bulb calcified plaques. IMPRESSION: 1. No acute intracranial pathology. Moderate age-related atrophy and chronic microvascular ischemic changes. 2. No acute/traumatic cervical spine pathology. 3. No acute facial bone fractures. Electronically Signed   By: Anner Crete M.D.   On: 08/09/2019 03:24   CT Cervical Spine  Wo Contrast  Result Date: 08/09/2019 CLINICAL DATA:  86 year old female with facial trauma. EXAM: CT HEAD WITHOUT CONTRAST CT MAXILLOFACIAL WITHOUT CONTRAST CT CERVICAL SPINE WITHOUT CONTRAST TECHNIQUE: Multidetector CT imaging of the head, cervical spine, and maxillofacial structures were performed using the standard protocol without intravenous contrast. Multiplanar CT image reconstructions of the cervical spine and maxillofacial structures were also generated. COMPARISON:  Head CT dated 05/25/2019. FINDINGS: CT HEAD FINDINGS Brain: Moderate age-related atrophy and chronic microvascular ischemic changes. There is no acute intracranial hemorrhage. No mass effect or midline shift. No extra-axial fluid collection. Vascular: No hyperdense vessel or unexpected calcification. Skull: No acute calvarial pathology. Right frontoparietal craniotomy. Other: None. CT MAXILLOFACIAL FINDINGS Osseous: No acute fracture. No mandibular subluxation. Orbits: The globes and retro-orbital fat are preserved. Sinuses: Clear. Soft tissues: Negative. CT CERVICAL SPINE FINDINGS Alignment: No acute subluxation. Skull base and vertebrae: No acute fracture. Osteopenia. Soft tissues and spinal canal: No prevertebral fluid or swelling. No visible canal hematoma. Disc levels:  Degenerative changes most prominent at C5-C6. Upper chest: Negative. Other: Bilateral carotid bulb calcified plaques. IMPRESSION: 1. No acute intracranial pathology. Moderate age-related atrophy and chronic microvascular ischemic changes. 2. No acute/traumatic cervical spine pathology. 3. No acute facial bone fractures. Electronically Signed   By: Anner Crete M.D.   On: 08/09/2019 03:24   CT Maxillofacial Wo Contrast  Result Date: 08/09/2019 CLINICAL DATA:  86 year old female with facial trauma. EXAM: CT HEAD WITHOUT CONTRAST CT MAXILLOFACIAL WITHOUT CONTRAST CT CERVICAL SPINE WITHOUT CONTRAST TECHNIQUE: Multidetector CT imaging of the head, cervical spine, and  maxillofacial structures were performed using the standard protocol without intravenous contrast. Multiplanar CT image reconstructions of the cervical spine and maxillofacial structures were also generated. COMPARISON:  Head CT dated 05/25/2019. FINDINGS: CT HEAD FINDINGS Brain: Moderate age-related atrophy and chronic microvascular ischemic changes. There is no acute intracranial hemorrhage. No mass effect or midline shift. No extra-axial fluid collection. Vascular: No hyperdense vessel or unexpected calcification. Skull: No acute calvarial pathology. Right frontoparietal craniotomy. Other: None. CT MAXILLOFACIAL FINDINGS Osseous: No acute fracture. No mandibular subluxation. Orbits: The globes and retro-orbital fat are preserved. Sinuses: Clear. Soft tissues: Negative. CT CERVICAL SPINE FINDINGS Alignment: No acute subluxation. Skull base and vertebrae: No acute fracture. Osteopenia. Soft tissues and spinal canal: No prevertebral fluid or swelling. No visible canal hematoma. Disc levels:  Degenerative  changes most prominent at C5-C6. Upper chest: Negative. Other: Bilateral carotid bulb calcified plaques. IMPRESSION: 1. No acute intracranial pathology. Moderate age-related atrophy and chronic microvascular ischemic changes. 2. No acute/traumatic cervical spine pathology. 3. No acute facial bone fractures. Electronically Signed   By: Anner Crete M.D.   On: 08/09/2019 03:24    Procedures Procedures   Medications Ordered in ED Medications - No data to display  ED Course  I have reviewed the triage vital signs and the nursing notes.  Pertinent labs & imaging results that were available during my care of the patient were reviewed by me and considered in my medical decision making (see chart for details).    MDM Rules/Calculators/A&P Fall with blunt facial trauma.  No evidence of any neurologic injury.  She is being sent for CT scans of head, cervical spine, maxillofacial bones.  Old records are  reviewed, and she does have prior ED visits for falls.  CT scans show no evidence of acute injury.  She is discharged with instructions to apply ice, use over-the-counter analgesics as needed for pain.  Encouraged to have someone fix her toilet seat so that it would not move when she sits down on it.  Final Clinical Impression(s) / ED Diagnoses Final diagnoses:  Fall at home, initial encounter  Contusion of face, initial encounter    Rx / DC Orders ED Discharge Orders    None       Delora Fuel, MD [id number]/68 [id number]

## 2019-08-09 NOTE — Discharge Instructions (Addendum)
Apply ice for 30 minutes at a time, 4 times a day.  Take acetaminophen as needed for pain control.

## 2019-10-10 ENCOUNTER — Other Ambulatory Visit: Payer: Self-pay

## 2019-10-10 ENCOUNTER — Emergency Department (HOSPITAL_COMMUNITY)
Admission: EM | Admit: 2019-10-10 | Discharge: 2019-10-10 | Disposition: A | Discharge status: Home / Self Care | Payer: Medicare Other | Attending: Emergency Medicine | Admitting: Emergency Medicine

## 2019-10-10 ENCOUNTER — Emergency Department (HOSPITAL_COMMUNITY): Payer: Medicare Other

## 2019-10-10 ENCOUNTER — Encounter (HOSPITAL_COMMUNITY): Payer: Self-pay | Admitting: Emergency Medicine

## 2019-10-10 DIAGNOSIS — Z79899 Other long term (current) drug therapy: Secondary | ICD-10-CM | POA: Diagnosis not present

## 2019-10-10 DIAGNOSIS — R109 Unspecified abdominal pain: Secondary | ICD-10-CM | POA: Diagnosis not present

## 2019-10-10 DIAGNOSIS — S4992XA Unspecified injury of left shoulder and upper arm, initial encounter: Secondary | ICD-10-CM | POA: Diagnosis present

## 2019-10-10 DIAGNOSIS — S0990XA Unspecified injury of head, initial encounter: Secondary | ICD-10-CM | POA: Diagnosis not present

## 2019-10-10 DIAGNOSIS — N184 Chronic kidney disease, stage 4 (severe): Secondary | ICD-10-CM | POA: Diagnosis not present

## 2019-10-10 DIAGNOSIS — S40012A Contusion of left shoulder, initial encounter: Secondary | ICD-10-CM | POA: Insufficient documentation

## 2019-10-10 DIAGNOSIS — S1093XA Contusion of unspecified part of neck, initial encounter: Secondary | ICD-10-CM | POA: Diagnosis not present

## 2019-10-10 DIAGNOSIS — I2510 Atherosclerotic heart disease of native coronary artery without angina pectoris: Secondary | ICD-10-CM | POA: Diagnosis not present

## 2019-10-10 DIAGNOSIS — W0110XA Fall on same level from slipping, tripping and stumbling with subsequent striking against unspecified object, initial encounter: Secondary | ICD-10-CM | POA: Diagnosis not present

## 2019-10-10 DIAGNOSIS — W19XXXA Unspecified fall, initial encounter: Secondary | ICD-10-CM

## 2019-10-10 DIAGNOSIS — Z951 Presence of aortocoronary bypass graft: Secondary | ICD-10-CM | POA: Diagnosis not present

## 2019-10-10 DIAGNOSIS — I129 Hypertensive chronic kidney disease with stage 1 through stage 4 chronic kidney disease, or unspecified chronic kidney disease: Secondary | ICD-10-CM | POA: Insufficient documentation

## 2019-10-10 DIAGNOSIS — Y939 Activity, unspecified: Secondary | ICD-10-CM | POA: Diagnosis not present

## 2019-10-10 DIAGNOSIS — N10 Acute tubulo-interstitial nephritis: Secondary | ICD-10-CM

## 2019-10-10 DIAGNOSIS — Z91040 Latex allergy status: Secondary | ICD-10-CM | POA: Diagnosis not present

## 2019-10-10 DIAGNOSIS — Z8551 Personal history of malignant neoplasm of bladder: Secondary | ICD-10-CM | POA: Insufficient documentation

## 2019-10-10 DIAGNOSIS — Y999 Unspecified external cause status: Secondary | ICD-10-CM | POA: Insufficient documentation

## 2019-10-10 DIAGNOSIS — S46912A Strain of unspecified muscle, fascia and tendon at shoulder and upper arm level, left arm, initial encounter: Secondary | ICD-10-CM

## 2019-10-10 DIAGNOSIS — Y929 Unspecified place or not applicable: Secondary | ICD-10-CM | POA: Insufficient documentation

## 2019-10-10 LAB — COMPREHENSIVE METABOLIC PANEL
ALT: 17 U/L (ref 0–44)
AST: 26 U/L (ref 15–41)
Albumin: 4.1 g/dL (ref 3.5–5.0)
Alkaline Phosphatase: 101 U/L (ref 38–126)
Anion gap: 11 (ref 5–15)
BUN: 42 mg/dL — ABNORMAL HIGH (ref 8–23)
CO2: 22 mmol/L (ref 22–32)
Calcium: 9.4 mg/dL (ref 8.9–10.3)
Chloride: 111 mmol/L (ref 98–111)
Creatinine, Ser: 2.88 mg/dL — ABNORMAL HIGH (ref 0.44–1.00)
GFR calc Af Amer: 16 mL/min — ABNORMAL LOW (ref >60)
GFR calc non Af Amer: 14 mL/min — ABNORMAL LOW (ref >60)
Glucose, Bld: 108 mg/dL — ABNORMAL HIGH (ref 70–99)
Potassium: 4.7 mmol/L (ref 3.5–5.1)
Sodium: 144 mmol/L (ref 135–145)
Total Bilirubin: 0.6 mg/dL (ref 0.3–1.2)
Total Protein: 7.8 g/dL (ref 6.5–8.1)

## 2019-10-10 LAB — URINALYSIS, ROUTINE W REFLEX MICROSCOPIC
Bilirubin Urine: NEGATIVE
Glucose, UA: NEGATIVE mg/dL
Ketones, ur: NEGATIVE mg/dL
Nitrite: POSITIVE — AB
Protein, ur: 100 mg/dL — AB
Specific Gravity, Urine: 1.011 (ref 1.005–1.030)
WBC, UA: >50 WBC/hpf — ABNORMAL HIGH (ref 0–5)
pH: 5 (ref 5.0–8.0)

## 2019-10-10 LAB — CBC WITH DIFFERENTIAL/PLATELET
Abs Immature Granulocytes: 0.01 10*3/uL (ref 0.00–0.07)
Basophils Absolute: 0.1 10*3/uL (ref 0.0–0.1)
Basophils Relative: 1 %
Eosinophils Absolute: 0.2 10*3/uL (ref 0.0–0.5)
Eosinophils Relative: 3 %
HCT: 38.4 % (ref 36.0–46.0)
Hemoglobin: 12.2 g/dL (ref 12.0–15.0)
Immature Granulocytes: 0 %
Lymphocytes Relative: 41 %
Lymphs Abs: 3.4 10*3/uL (ref 0.7–4.0)
MCH: 29.9 pg (ref 26.0–34.0)
MCHC: 31.8 g/dL (ref 30.0–36.0)
MCV: 94.1 fL (ref 80.0–100.0)
Monocytes Absolute: 0.8 10*3/uL (ref 0.1–1.0)
Monocytes Relative: 10 %
Neutro Abs: 3.7 10*3/uL (ref 1.7–7.7)
Neutrophils Relative %: 45 %
Platelets: 175 10*3/uL (ref 150–400)
RBC: 4.08 MIL/uL (ref 3.87–5.11)
RDW: 15.1 % (ref 11.5–15.5)
WBC: 8.2 10*3/uL (ref 4.0–10.5)
nRBC: 0 % (ref 0.0–0.2)

## 2019-10-10 LAB — TROPONIN I (HIGH SENSITIVITY)
Troponin I (High Sensitivity): 9 ng/L (ref <18)
Troponin I (High Sensitivity): 9 ng/L (ref <18)

## 2019-10-10 LAB — CK: Total CK: 136 U/L (ref 38–234)

## 2019-10-10 MED ORDER — DOXYCYCLINE HYCLATE 100 MG PO TABS
100.0000 mg | ORAL_TABLET | Freq: Once | ORAL | Status: AC
  Administered 2019-10-10: 100 mg via ORAL
  Filled 2019-10-10: qty 1

## 2019-10-10 MED ORDER — MORPHINE SULFATE (PF) 4 MG/ML IV SOLN
4.0000 mg | Freq: Once | INTRAVENOUS | Status: AC
  Administered 2019-10-10: 4 mg via INTRAVENOUS
  Filled 2019-10-10: qty 1

## 2019-10-10 MED ORDER — SODIUM CHLORIDE 0.9 % IV BOLUS
1000.0000 mL | Freq: Once | INTRAVENOUS | Status: AC
  Administered 2019-10-10: 1000 mL via INTRAVENOUS

## 2019-10-10 MED ORDER — DOXYCYCLINE HYCLATE 100 MG PO CAPS
100.0000 mg | ORAL_CAPSULE | Freq: Two times a day (BID) | ORAL | 0 refills | Status: DC
Start: 2019-10-10 — End: 2020-01-19

## 2019-10-10 MED ORDER — HYDRALAZINE HCL 20 MG/ML IJ SOLN
5.0000 mg | Freq: Once | INTRAMUSCULAR | Status: AC
  Administered 2019-10-10: 5 mg via INTRAVENOUS
  Filled 2019-10-10: qty 1

## 2019-10-10 NOTE — ED Triage Notes (Signed)
Per [facility] pt from [facility] for fall. C/o left shoulder and left flank pains.  Vitals: 156/98, 68HR, 96%

## 2019-10-10 NOTE — ED Provider Notes (Signed)
[facility] DEPT Provider Note   CSN: [id number] Arrival date & time: 10/10/19  1519     History Chief Complaint  Patient presents with   Fall   Shoulder Pain   Flank Pain    [name] [name] is a 86 y.o. female history of CAD, CKD, hypertension, here presenting with fall.  Patient states that she slipped and fell and landed on the left shoulder yesterday.  Patient states that she has some bruising on the left shoulder area and neck.  She did fall and hit her head.  She also complains of left-sided chest pressure as well.  Patient states that it is worse with movement. She does have a history of CAD and CABG and is not similar to her previous heart attacks.  Complains of some left flank pain as well denies any urinary symptoms.   The history is provided by the patient.       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[place])    Essential hypertension, benign    Hypokalemia    Insomnia    Iron deficiency    Macular degeneration    Mixed hyperlipidemia    Obesity    Postsurgical aortocoronary bypass status     Patient Active Problem List   Diagnosis Date Noted   Traumatic subdural hematoma ([place]) 08/08/2018   Pyelonephritis 09/09/2016   Falls frequently 06/24/2016   Tension headache 11/10/2013   Chest pain 11/03/2011   ARF (acute renal failure) ([place]) 04/30/2011   UTI (urinary tract infection) 04/30/2011   CKD (chronic kidney disease), stage IV ([place]) 04/30/2011   Anemia 04/30/2011   S/p nephrectomy 04/30/2011   Macular degeneration 04/30/2011   Cataract 04/30/2011   Bladder cancer ([place]) 04/30/2011   Weakness generalized 04/29/2011   Seizure disorder ([place]) 04/29/2011   Benign hypertension  04/29/2011   CAD (coronary artery disease) 04/29/2011   Hyperlipidemia 04/29/2011   Anxiety 04/29/2011   Dizziness - light-headed 04/29/2011   Chronic abdominal pain 04/29/2011   Mixed hyperlipidemia 05/24/2008   Essential hypertension, benign 05/24/2008   Postsurgical aortocoronary bypass status 05/24/2008    Past Surgical History:  Procedure Laterality Date   CATARACT EXTRACTION     CORONARY ARTERY BYPASS GRAFT     IRIDECTOMY     NEPHRECTOMY  2000     OB History    Gravida  3   Para  3   Term  3   Preterm      AB      Living  3     SAB      TAB      Ectopic      Multiple      Live Births              Family History  Problem Relation Age of Onset   Colon cancer Mother    Heart attack Father    Diabetes Father    Stroke Other    Coronary artery disease Other    Cancer Other    Diabetes Other     Social History   Tobacco Use   Smoking status: Never Smoker   Smokeless tobacco: Never Used  Vaping Use   Vaping Use: Never used  Substance Use Topics   Alcohol use: No   Drug use: No    Home Medications Prior to Admission medications   Medication Sig Start Date End Date Taking? Authorizing Provider  amLODipine (NORVASC) 2.5 MG tablet Take 2.5 mg by mouth daily. 10/29/17   [provider]  BELSOMRA 20 MG TABS Take 20 mg by mouth at bedtime.  12/15/14   [provider]  clonazePAM (KLONOPIN) 0.5 MG tablet Take 1 tablet (0.5 mg total) by mouth at bedtime. 08/10/18   Domenic Polite, MD  Cranberry 250 MG TABS Take 250 mg by mouth daily.     [provider]  gabapentin (NEURONTIN) 300 MG capsule Take 1 capsule (300 mg total) by mouth at bedtime. 08/10/18   Domenic Polite, MD  HYDROcodone-acetaminophen [facility]) 10-325 MG tablet Take 1 tablet by mouth every 6 (six) hours as needed for moderate pain or severe pain. 08/10/18   Domenic Polite, MD  levETIRAcetam (KEPPRA) 250 MG tablet Take 250 mg by mouth 2 (two)  times daily. Manage now by her primary doctor Dr Edwinna Areola hall pt pick up refill 05/13/2019    [provider]  LINZESS 145 MCG CAPS capsule Take 145 mcg by mouth at bedtime.  12/18/14   [provider]  Magnesium 250 MG TABS Take 250 mg by mouth daily.     [provider]  Multiple Vitamins-Minerals (CENTRUM ULTRA WOMENS) TABS Take by mouth.    [provider]  Multiple Vitamins-Minerals (PRESERVISION AREDS PO) Take 1 capsule by mouth daily.    [provider]  polyethylene glycol (MIRALAX / GLYCOLAX) packet Take 17 g by mouth daily as needed for moderate constipation.    [provider]  Pumpkin Seed-Soy Germ (AZO BLADDER CONTROL/GO-LESS) CAPS Take by mouth.    [provider]  rosuvastatin (CRESTOR) 10 MG tablet Take 10 mg by mouth daily.  01/06/11   [provider]  torsemide (DEMADEX) 10 MG tablet Take 10 mg by mouth every other day.    [provider]    Allergies    Adhesive [tape], Keflet [cephalexin], Latex, Penicillins, and Sulfa antibiotics  Review of Systems   Review of Systems  Genitourinary: Positive for flank pain.  All other systems reviewed and are negative.   Physical Exam Updated Vital Signs BP (!) 179/73    Pulse 71    Temp 98.1 F (36.7 C) (Oral)    Resp 18    SpO2 95%   Physical Exam Vitals and nursing note reviewed.  HENT:     Head: Normocephalic.     Right Ear: Tympanic membrane normal.     Nose: Nose normal.     Mouth/Throat:     Mouth: Mucous membranes are moist.  Eyes:     Extraocular Movements: Extraocular movements intact.     Pupils: Pupils are equal, round, and reactive to light.  Neck:     Comments: Bruising L lower neck area and L trapezius  Cardiovascular:     Rate and Rhythm: Normal rate and regular rhythm.     Pulses: Normal pulses.  Pulmonary:     Effort: Pulmonary effort is normal.     Breath sounds: Normal breath sounds.  Abdominal:     General: Abdomen is  flat.     Palpations: Abdomen is soft.     Comments: Mild L CVAT   Musculoskeletal:     Cervical back: Normal range of motion.  Comments: Bruising L shoulder and trapezius but able to range the shoulder, no proximal humerus tenderness. Mild L hip tenderness. + L paralumbar tenderness   Skin:    General: Skin is warm.     Capillary Refill: Capillary refill takes less than 2 seconds.  Neurological:     General: No focal deficit present.     Mental Status: She is alert and oriented to person, place, and time.  Psychiatric:        Mood and Affect: Mood normal.        Behavior: Behavior normal.     ED Results / Procedures / Treatments   Labs (all labs ordered are listed, but only abnormal results are displayed) Labs Reviewed  COMPREHENSIVE METABOLIC PANEL - Abnormal; Notable for the following components:      Result Value   Glucose, Bld 108 (*)    BUN 42 (*)    Creatinine, Ser 2.88 (*)    GFR calc non Af Amer 14 (*)    GFR calc Af Amer 16 (*)    All other components within normal limits  URINALYSIS, ROUTINE W REFLEX MICROSCOPIC - Abnormal; Notable for the following components:   APPearance HAZY (*)    Hgb urine dipstick MODERATE (*)    Protein, ur 100 (*)    Nitrite POSITIVE (*)    Leukocytes,Ua LARGE (*)    WBC, UA >50 (*)    Bacteria, UA MANY (*)    All other components within normal limits  URINE CULTURE  CBC WITH DIFFERENTIAL/PLATELET  CK  TROPONIN I (HIGH SENSITIVITY)  TROPONIN I (HIGH SENSITIVITY)    EKG EKG Interpretation  Date/Time:  Monday October 10 2019 18:13:59 EDT Ventricular Rate:  66 PR Interval:    QRS Duration: 127 QT Interval:  448 QTC Calculation: 466 R Axis:   -2 Text Interpretation: Ectopic atrial rhythm Short PR interval Nonspecific intraventricular conduction delay poor baseline, grossly unchanged Confirmed by Wandra Arthurs [phone]) on 10/10/2019 6:23:57 PM   Radiology DG Chest 1 View  Result Date: 10/10/2019 CLINICAL DATA:  Fall. EXAM: CHEST   1 VIEW COMPARISON:  04/04/2016 and 12/18/2015 FINDINGS: Lungs are adequately inflated without focal airspace consolidation or effusion. Stable scalloping of the left hemidiaphragm. Mild stable cardiomegaly. Old right lower lateral rib fractures and old lateral left mid rib fractures. No definite acute fracture. Previous kyphoplasty at approximately L1. IMPRESSION: No acute findings. Electronically Signed   By: Marin Olp M.D.   On: 10/10/2019 18:39   CT Head Wo Contrast  Result Date: 10/10/2019 CLINICAL DATA:  86 year old female with head trauma. EXAM: CT HEAD WITHOUT CONTRAST CT CERVICAL SPINE WITHOUT CONTRAST TECHNIQUE: Multidetector CT imaging of the head and cervical spine was performed following the standard protocol without intravenous contrast. Multiplanar CT image reconstructions of the cervical spine were also generated. COMPARISON:  CT dated 08/09/2019. FINDINGS: CT HEAD FINDINGS Brain: There is mild age-related atrophy and chronic microvascular ischemic changes. Focal right basal ganglia hypodensity, present on the prior CT, but more conspicuous on today's exam likely chronic and represent old lacunar infarct. There is no acute intracranial hemorrhage. No mass effect or midline shift. No extra-axial fluid collection. Vascular: No hyperdense vessel or unexpected calcification. Skull: No acute calvarial pathology. Right parietal craniotomy. Sinuses/Orbits: No acute finding. Other: None CT CERVICAL SPINE FINDINGS Alignment: No acute subluxation. Skull base and vertebrae: No acute fracture. Soft tissues and spinal canal: No prevertebral fluid or swelling. No visible canal hematoma. Disc levels: Multilevel degenerative changes. There  is partial C2-C3 fusion. There is grade 1 C5-C6 retrolisthesis. Upper chest: Small left upper lobe calcified granuloma. Other: Bilateral carotid bulb calcified plaques. IMPRESSION: 1. No acute intracranial pathology. Mild age-related atrophy and chronic microvascular  ischemic changes. Old right basal ganglia lacunar infarct. 2. No acute/traumatic cervical spine pathology. Multilevel degenerative changes. Electronically Signed   By: Anner Crete M.D.   On: 10/10/2019 19:07   CT Chest Wo Contrast  Result Date: 10/10/2019 CLINICAL DATA:  Fall. EXAM: CT CHEST, ABDOMEN AND PELVIS WITHOUT CONTRAST TECHNIQUE: Multidetector CT imaging of the chest, abdomen and pelvis was performed following the standard protocol without IV contrast. COMPARISON:  Chest CT 11/22/2002 and abdominopelvic CT 09/09/2016 FINDINGS: CT CHEST FINDINGS Cardiovascular: Heart is normal size. There is calcified plaque over the 3 vessel coronary arteries as well as thoracic aorta. Median sternotomy wires are present. Remaining vascular structures are unremarkable. Mediastinum/Nodes: No evidence of mediastinal or hilar adenopathy. Remaining mediastinal structures are normal. Lungs/Pleura: Lungs are adequately inflated without focal airspace consolidation or effusion. No pneumothorax. Multiple small bilateral calcified granulomas are present. Minimal linear scarring left base. Airways are normal. Musculoskeletal: Several old right sided rib fractures. Discontinuity of the cortex of the lower right scapula slightly more prominent but was present in 2004 and therefore a chronic finding. Degenerative changes of the spine. CT ABDOMEN PELVIS FINDINGS Hepatobiliary: Gallbladder, liver and biliary tree are normal. Pancreas: Normal. Spleen: Normal. Adrenals/Urinary Tract: Adrenal glands are normal. Single left kidney without hydronephrosis or nephrolithiasis. Minimal prominence of the left ureter. Bladder is un changed. Possible moderate size right posterolateral bladder diverticulum containing a subtle fluid-fluid level unchanged. Stomach/Bowel: Stomach and small bowel are normal. Diverticulosis of the colon most prominent over the sigmoid colon. Appendix not visualized. Vascular/Lymphatic: Moderate calcified plaque  over the abdominal aorta which is normal in caliber. No evidence of adenopathy. Reproductive: Previous hysterectomy.  Ovaries are normal. Other: Stable hernia over the right lateral abdominal wall. No free fluid or focal inflammatory change. Musculoskeletal: Old bilateral pelvic fractures. Previous L1 kyphoplasty. IMPRESSION: 1.  No acute findings in the chest, abdomen or pelvis. 2. Aortic Atherosclerosis (ICD10-I70.0). Atherosclerotic coronary artery disease. 3. Single left kidney without hydronephrosis or nephrolithiasis. Suggestion of a moderate size right posterolateral bladder diverticulum unchanged containing slight hyperdense debris. 4.  Colonic diverticulosis. 5.  Old bilateral pelvic fractures.  Previous L1 kyphoplasty. Electronically Signed   By: Marin Olp M.D.   On: 10/10/2019 19:17   CT Cervical Spine Wo Contrast  Result Date: 10/10/2019 CLINICAL DATA:  86 year old female with head trauma. EXAM: CT HEAD WITHOUT CONTRAST CT CERVICAL SPINE WITHOUT CONTRAST TECHNIQUE: Multidetector CT imaging of the head and cervical spine was performed following the standard protocol without intravenous contrast. Multiplanar CT image reconstructions of the cervical spine were also generated. COMPARISON:  CT dated 08/09/2019. FINDINGS: CT HEAD FINDINGS Brain: There is mild age-related atrophy and chronic microvascular ischemic changes. Focal right basal ganglia hypodensity, present on the prior CT, but more conspicuous on today's exam likely chronic and represent old lacunar infarct. There is no acute intracranial hemorrhage. No mass effect or midline shift. No extra-axial fluid collection. Vascular: No hyperdense vessel or unexpected calcification. Skull: No acute calvarial pathology. Right parietal craniotomy. Sinuses/Orbits: No acute finding. Other: None CT CERVICAL SPINE FINDINGS Alignment: No acute subluxation. Skull base and vertebrae: No acute fracture. Soft tissues and spinal canal: No prevertebral fluid or  swelling. No visible canal hematoma. Disc levels: Multilevel degenerative changes. There is partial C2-C3 fusion. There is grade  1 C5-C6 retrolisthesis. Upper chest: Small left upper lobe calcified granuloma. Other: Bilateral carotid bulb calcified plaques. IMPRESSION: 1. No acute intracranial pathology. Mild age-related atrophy and chronic microvascular ischemic changes. Old right basal ganglia lacunar infarct. 2. No acute/traumatic cervical spine pathology. Multilevel degenerative changes. Electronically Signed   By: Anner Crete M.D.   On: 10/10/2019 19:07   DG Shoulder Left  Result Date: 10/10/2019 CLINICAL DATA:  86 year old female with fall and trauma to the left shoulder. EXAM: LEFT SHOULDER - 2+ VIEW COMPARISON:  None. FINDINGS: There is no acute fracture or dislocation. Evaluation is limited in the absence of axial view. There is mild to moderate arthritic changes. The bones are osteopenic. The soft tissues are unremarkable. IMPRESSION: No acute fracture or dislocation. Electronically Signed   By: Anner Crete M.D.   On: 10/10/2019 16:44   CT Renal Stone Study  Result Date: 10/10/2019 CLINICAL DATA:  Fall. EXAM: CT CHEST, ABDOMEN AND PELVIS WITHOUT CONTRAST TECHNIQUE: Multidetector CT imaging of the chest, abdomen and pelvis was performed following the standard protocol without IV contrast. COMPARISON:  Chest CT 11/22/2002 and abdominopelvic CT 09/09/2016 FINDINGS: CT CHEST FINDINGS Cardiovascular: Heart is normal size. There is calcified plaque over the 3 vessel coronary arteries as well as thoracic aorta. Median sternotomy wires are present. Remaining vascular structures are unremarkable. Mediastinum/Nodes: No evidence of mediastinal or hilar adenopathy. Remaining mediastinal structures are normal. Lungs/Pleura: Lungs are adequately inflated without focal airspace consolidation or effusion. No pneumothorax. Multiple small bilateral calcified granulomas are present. Minimal linear scarring  left base. Airways are normal. Musculoskeletal: Several old right sided rib fractures. Discontinuity of the cortex of the lower right scapula slightly more prominent but was present in 2004 and therefore a chronic finding. Degenerative changes of the spine. CT ABDOMEN PELVIS FINDINGS Hepatobiliary: Gallbladder, liver and biliary tree are normal. Pancreas: Normal. Spleen: Normal. Adrenals/Urinary Tract: Adrenal glands are normal. Single left kidney without hydronephrosis or nephrolithiasis. Minimal prominence of the left ureter. Bladder is un changed. Possible moderate size right posterolateral bladder diverticulum containing a subtle fluid-fluid level unchanged. Stomach/Bowel: Stomach and small bowel are normal. Diverticulosis of the colon most prominent over the sigmoid colon. Appendix not visualized. Vascular/Lymphatic: Moderate calcified plaque over the abdominal aorta which is normal in caliber. No evidence of adenopathy. Reproductive: Previous hysterectomy.  Ovaries are normal. Other: Stable hernia over the right lateral abdominal wall. No free fluid or focal inflammatory change. Musculoskeletal: Old bilateral pelvic fractures. Previous L1 kyphoplasty. IMPRESSION: 1.  No acute findings in the chest, abdomen or pelvis. 2. Aortic Atherosclerosis (ICD10-I70.0). Atherosclerotic coronary artery disease. 3. Single left kidney without hydronephrosis or nephrolithiasis. Suggestion of a moderate size right posterolateral bladder diverticulum unchanged containing slight hyperdense debris. 4.  Colonic diverticulosis. 5.  Old bilateral pelvic fractures.  Previous L1 kyphoplasty. Electronically Signed   By: Marin Olp M.D.   On: 10/10/2019 19:17   DG Hip Unilat W or Wo Pelvis 2-3 Views Left  Result Date: 10/10/2019 CLINICAL DATA:  Fall.  History of remote pelvic fracture. EXAM: DG HIP (WITH OR WITHOUT PELVIS) 2-3V LEFT COMPARISON:  05/25/2019 FINDINGS: Evidence of patient's old bilateral pelvic fracture involving  pubic rami and symphysis unchanged. No evidence of acute fracture. Mild symmetric degenerative change of the hips. Degenerative change of the spine and sacroiliac joints. IMPRESSION: 1.  No acute findings. 2.  Old bilateral pelvic fractures. Electronically Signed   By: Marin Olp M.D.   On: 10/10/2019 18:37    Procedures Procedures (including critical  care time)  Medications Ordered in ED Medications  sodium chloride 0.9 % bolus 1,000 mL (0 mLs Intravenous Stopped 10/10/19 2143)  morphine 4 MG/ML injection 4 mg (4 mg Intravenous Given 10/10/19 1915)  doxycycline (VIBRA-TABS) tablet 100 mg (100 mg Oral Given 10/10/19 2142)    ED Course  I have reviewed the triage vital signs and the nursing notes.  Pertinent labs & imaging results that were available during my care of the patient were reviewed by me and considered in my medical decision making (see chart for details).    MDM Rules/Calculators/A&P                          SHENAE BONANNO is a 86 y.o. female here with fall and left shoulder and left flank pain.  She fell yesterday so consider rhabdo versus contusion versus rib fracture versus pyelonephritis.  Patient did have some chest pain but likely from the fall.  I have low suspicion for ACS with troponin x2 sufficient.  Plan to get trauma scans as well as x-rays Will get CBC, CMP, urinalysis.  10:06 PM Labs showed baseline renal failure. UA + UTI. Trop neg x 2.  Trauma scans unremarkable.  Given doxycycline for UTI since she has multiple drug allergies as well as renal failure.  Stable for discharge back to facility with doxycycline.  Of note, patient is on hydrocodone for chronic pain.  Final Clinical Impression(s) / ED Diagnoses Final diagnoses:  None    Rx / DC Orders ED Discharge Orders    None       Drenda Freeze, MD 10/10/19 2208

## 2019-10-10 NOTE — Discharge Instructions (Addendum)
Take doxycycline twice daily for a week   Stay hydrated   Your scans today did not show any fractures   See your doctor   Continue your pain meds as prescribed   Return to ER if you have worse flank pain, fever, worse shoulder pain.

## 2019-10-13 LAB — URINE CULTURE: Culture: >=100000 — AB

## 2019-10-14 ENCOUNTER — Telehealth: Payer: Self-pay

## 2019-10-14 NOTE — Telephone Encounter (Signed)
Called for symptom check:  Spoke with Daughter and states she is getting better every day.  No reported urinary symptms of fevers.  Encouraged to go to PCP next week  And to return to ED is needed

## 2019-10-14 NOTE — Progress Notes (Signed)
ED Antimicrobial Stewardship Positive Culture Follow Up   [name] [name] is an 86 y.o. female who presented to [facility] on 10/10/2019 with a chief complaint of  Chief Complaint  Patient presents with  . Fall  . Shoulder Pain  . Flank Pain    Recent Results (from the past 720 hour(s))  Urine Culture     Status: Abnormal   Collection Time: 10/10/19  8:03 PM   Specimen: Urine, Random  Result Value Ref Range Status   Specimen Description   Final    URINE, RANDOM Performed at [facility] [address] [place], [place] [address]    Special Requests   Final    NONE Performed at [facility], [place] [address], [place], [place] [address]    Culture (A)  Final    >=100,000 COLONIES/mL ESCHERICHIA COLI Confirmed Extended Spectrum Beta-Lactamase Producer (ESBL).  In bloodstream infections from ESBL organisms, carbapenems are preferred over piperacillin/tazobactam. They are shown to have a lower risk of mortality.    Report Status 10/13/2019 FINAL  Final   Organism ID, Bacteria ESCHERICHIA COLI (A)  Final      Susceptibility   Escherichia coli - MIC*    AMPICILLIN >=32 RESISTANT Resistant     CEFAZOLIN >=64 RESISTANT Resistant     CEFTRIAXONE >=64 RESISTANT Resistant     CIPROFLOXACIN >=4 RESISTANT Resistant     GENTAMICIN <=1 SENSITIVE Sensitive     IMIPENEM <=0.25 SENSITIVE Sensitive     NITROFURANTOIN 64 INTERMEDIATE Intermediate     TRIMETH/SULFA >=320 RESISTANT Resistant     AMPICILLIN/SULBACTAM 4 SENSITIVE Sensitive     PIP/TAZO <=4 SENSITIVE Sensitive     * >=100,000 COLONIES/mL ESCHERICHIA COLI   Treated with doxycycline x 7d - does typically cover E coli but susceptibility not known in this case as doxycycline is not tested for as part of the urinary susceptibility panel.  Plan: Call patient for symptom check  If improved (no further flank pain, no new urinary symptoms), no further treatment needed, but d/t age & CKD recommend f/u with  PCP at 1 week (7/12)  If persistent flank pain, new fevers, or new urinary tract symptoms, will need to return to ED for evaluation and possible IV antibiotics.  ED Provider: Alfredia Client, PA-C   [name] A 10/14/2019, 11:27 AM Clinical Pharmacist [phone]

## 2019-12-13 ENCOUNTER — Encounter (INDEPENDENT_AMBULATORY_CARE_PROVIDER_SITE_OTHER): Payer: Medicare Other | Admitting: Ophthalmology

## 2019-12-20 ENCOUNTER — Ambulatory Visit (INDEPENDENT_AMBULATORY_CARE_PROVIDER_SITE_OTHER): Payer: Medicare Other | Admitting: Ophthalmology

## 2019-12-20 ENCOUNTER — Encounter (INDEPENDENT_AMBULATORY_CARE_PROVIDER_SITE_OTHER): Payer: Self-pay | Admitting: Ophthalmology

## 2019-12-20 ENCOUNTER — Other Ambulatory Visit: Payer: Self-pay

## 2019-12-20 DIAGNOSIS — H353232 Exudative age-related macular degeneration, bilateral, with inactive choroidal neovascularization: Secondary | ICD-10-CM

## 2019-12-20 DIAGNOSIS — H353222 Exudative age-related macular degeneration, left eye, with inactive choroidal neovascularization: Secondary | ICD-10-CM

## 2019-12-20 DIAGNOSIS — H353212 Exudative age-related macular degeneration, right eye, with inactive choroidal neovascularization: Secondary | ICD-10-CM

## 2019-12-20 DIAGNOSIS — H35363 Drusen (degenerative) of macula, bilateral: Secondary | ICD-10-CM | POA: Insufficient documentation

## 2019-12-20 NOTE — Assessment & Plan Note (Signed)
No Therapy indicated OU.

## 2019-12-20 NOTE — Progress Notes (Signed)
12/20/2019     CHIEF COMPLAINT Patient presents for Retina Follow Up   HISTORY OF PRESENT ILLNESS: [name] [name] is a 86 y.o. female who presents to the clinic today for:   HPI    Retina Follow Up    Patient presents with  Dry AMD.  In both eyes.  This started 2 years ago.  Severity is mild.  Duration of 2 years.  Since onset it is stable.          Comments    2 Year AMD F/U OU  Pt denies noticeable changes to [place] OU since last visit. Pt denies ocular pain, flashes of light, or floaters OU. Pt sts, "it's doing just fine." Pt reports occasional photophobia OU.       Last edited by Rockie Neighbours, [place] on 12/20/2019  2:39 PM. (History)      Referring physician: Celene Squibb, MD [place],  [place] [address]  HISTORICAL INFORMATION:   Selected notes from the MEDICAL RECORD NUMBER    Lab Results  Component Value Date   HGBA1C (H) 04/21/2007    6.3 (NOTE)   The ADA recommends the following therapeutic goals for glycemic   control related to Hgb A1C measurement:   Goal of Therapy:   < 7.0% Hgb A1C   Action Suggested:  > 8.0% Hgb A1C   Ref:  Diabetes Care, 22, Suppl. 1, 1999     CURRENT MEDICATIONS: No current outpatient medications on file. (Ophthalmic Drugs)   No current facility-administered medications for this visit. (Ophthalmic Drugs)   Current Outpatient Medications (Other)  Medication Sig  . acetaminophen (TYLENOL) 325 MG tablet Take 650 mg by mouth every 6 (six) hours as needed for mild pain.   Marland Kitchen amLODipine (NORVASC) 2.5 MG tablet Take 2.5 mg by mouth daily.  Marland Kitchen aspirin EC 81 MG tablet Take 81 mg by mouth daily. Swallow whole.  . clonazePAM (KLONOPIN) 0.5 MG tablet Take 1 tablet (0.5 mg total) by mouth at bedtime. (Patient taking differently: Take 0.5 mg by mouth 2 (two) times daily. )  . doxycycline (VIBRAMYCIN) 100 MG capsule Take 1 capsule (100 mg total) by mouth 2 (two) times daily. One po bid x 7 days  . gabapentin (NEURONTIN) 300 MG capsule  Take 1 capsule (300 mg total) by mouth at bedtime.  Marland Kitchen HYDROcodone-acetaminophen (NORCO) 10-325 MG tablet Take 1 tablet by mouth every 6 (six) hours as needed for moderate pain or severe pain. (Patient not taking: Reported on 10/10/2019)  . HYDROcodone-acetaminophen (NORCO/VICODIN) 5-325 MG tablet Take 1 tablet by mouth every 6 (six) hours as needed for moderate pain.  Marland Kitchen levETIRAcetam (KEPPRA) 250 MG tablet Take 250 mg by mouth 2 (two) times daily.   Marland Kitchen linaclotide (LINZESS) 290 MCG CAPS capsule Take 290 mcg by mouth daily.  . mirabegron ER (MYRBETRIQ) 25 MG TB24 tablet Take 25 mg by mouth daily.  . Multiple Vitamins-Minerals (PRESERVISION AREDS PO) Take 1 capsule by mouth daily.  . polyethylene glycol (MIRALAX / GLYCOLAX) packet Take 17 g by mouth every other day. As needed for constipation.  Marland Kitchen PRESCRIPTION MEDICATION Apply 1 application topically in the morning and at bedtime. Anti - itch lotion 0.5-0.5% 9Mix with triamcinolone & spread topically to affected areas twice daily.  . Pumpkin Seed-Soy Germ (AZO BLADDER CONTROL/GO-LESS) CAPS Take 1 capsule by mouth daily.   . rosuvastatin (CRESTOR) 10 MG tablet Take 10 mg by mouth daily.   . sodium zirconium cyclosilicate (LOKELMA)  10 g PACK packet Take 10 g by mouth See admin instructions. Takes twice weekly on Monday and Thursday  . Suvorexant (BELSOMRA) 10 MG TABS Take 10 mg by mouth daily.  . tizanidine (ZANAFLEX) 2 MG capsule Take 2 mg by mouth 2 (two) times daily as needed for muscle spasms.  Marland Kitchen triamcinolone cream (KENALOG) 0.1 % Apply 1 application topically 2 (two) times daily. Mix with Sarna lotion   No current facility-administered medications for this visit. (Other)      REVIEW OF SYSTEMS:    ALLERGIES Allergies  Allergen Reactions  . Adhesive [Tape] Other (See Comments)    Tears patients skin off.   [name] [Cephalexin] Nausea And Vomiting  . Latex Itching  . Penicillins Hives and Itching  . Sulfa Antibiotics Hives and Itching      PAST MEDICAL HISTORY Past Medical History:  Diagnosis Date  . Abnormal gait   . Asthenia   . Back pain   . Bladder cancer ([place])   . CAD (coronary artery disease)   . Chronic pain   . CKD (chronic kidney disease), stage IV ([place])   . Dizziness   . Dysphagia   . Epileptic grand mal status ([place])   . Essential hypertension, benign   . Hypokalemia   . Insomnia   . Iron deficiency   . Macular degeneration   . Mixed hyperlipidemia   . Obesity   . Postsurgical aortocoronary bypass status    Past Surgical History:  Procedure Laterality Date  . CATARACT EXTRACTION    . CORONARY ARTERY BYPASS GRAFT    . IRIDECTOMY    . NEPHRECTOMY  2000    FAMILY HISTORY Family History  Problem Relation Age of Onset  . Colon cancer Mother   . Heart attack Father   . Diabetes Father   . Stroke Other   . Coronary artery disease Other   . Cancer Other   . Diabetes Other     SOCIAL HISTORY Social History   Tobacco Use  . Smoking status: Never Smoker  . Smokeless tobacco: Never Used  Vaping Use  . Vaping Use: Never used  Substance Use Topics  . Alcohol use: No  . Drug use: No         OPHTHALMIC EXAM: Base Eye Exam    Visual Acuity (ETDRS)      Right Left   Dist cc 20/200 -1 20/200 -1   Dist ph cc NI NI   Correction: Glasses       Tonometry (Tonopen, 2:44 PM)      Right Left   Pressure 22 19       Pupils      Pupils Dark Light Shape React APD   Right PERRL 3 2 Round Brisk None   Left PERRL 3 2 Round Brisk None       Visual Fields (Counting fingers)      Left Right    Full Full  Difficult cooperation OU       Extraocular Movement      Right Left    Full Full       Neuro/Psych    Oriented x3: Yes   Mood/Affect: Normal       Dilation    Both eyes: 1.0% Mydriacyl, 2.5% Phenylephrine @ 2:44 PM        Slit Lamp and Fundus Exam    External Exam      Right Left   External Normal Normal  Slit Lamp Exam      Right Left   Lids/Lashes Normal  Normal   Conjunctiva/Sclera White and quiet White and quiet   Cornea Clear Clear   Anterior Chamber Deep and quiet Deep and quiet   Iris Round and reactive Round and reactive   Lens Centered posterior chamber intraocular lens Centered posterior chamber intraocular lens   Anterior Vitreous Normal Normal       Fundus Exam      Right Left   Posterior Vitreous Normal Normal   Disc Peripapillary atrophy Peripapillary atrophy   C/D Ratio 0.3 0.3   Macula Geographic atrophy, Disciform scar with no active margins Geographic atrophy, Disciform scar with no active margins   Vessels Normal Normal   Periphery Normal Normal          IMAGING AND PROCEDURES  Imaging and Procedures for 12/20/19  Color Fundus Photography Optos - OU - Both Eyes       Right Eye Progression has been stable. Disc findings include normal observations. Vessels : normal observations. Periphery : normal observations.   Left Eye Progression has been stable. Disc findings include normal observations. Vessels : normal observations. Periphery : normal observations.   Notes OU with PERI papillary atrophy, scars, no active margins to the lesions.  Will over time.                ASSESSMENT/PLAN:  Exudative age-related macular degeneration of right eye with inactive choroidal neovascularization (HCC) No Therapy indicated OU.      ICD-10-CM   1. Exudative age-related macular degeneration of both eyes with inactive choroidal neovascularization (HCC)  H35.3232 Color Fundus Photography Optos - OU - Both Eyes  2. Exudative age-related macular degeneration of right eye with inactive choroidal neovascularization ([place])  H35.3212   3. Degenerative retinal drusen of both eyes  H35.363   4. Exudative age-related macular degeneration of left eye with inactive choroidal neovascularization ([place])  H35.3222     1.  Patient proceeds with her activities still daily living quite well.  2.  No active therapy needed for the  retinal condition currently.  3.  Ophthalmic Meds Ordered this visit:  No orders of the defined types were placed in this encounter.      Return in about 2 years (around 12/19/2021) for COLOR FP, DILATE OU.  There are no Patient Instructions on file for this visit.   Explained the diagnoses, plan, and follow up with the patient and they expressed understanding.  Patient expressed understanding of the importance of proper follow up care.   Clent Demark Reyansh Kushnir M.D. Diseases & Surgery of the Retina and Vitreous [facility] [place] 12/20/19     Abbreviations: M myopia (nearsighted); A astigmatism; H hyperopia (farsighted); P presbyopia; Mrx spectacle prescription;  CTL contact lenses; OD right eye; OS left eye; OU both eyes  XT exotropia; ET esotropia; PEK punctate epithelial keratitis; PEE punctate epithelial erosions; DES dry eye syndrome; MGD meibomian gland dysfunction; ATs artificial tears; PFAT's preservative free artificial tears; Arthur nuclear sclerotic cataract; PSC posterior subcapsular cataract; ERM epi-retinal membrane; PVD posterior vitreous detachment; RD retinal detachment; DM diabetes mellitus; DR diabetic retinopathy; NPDR non-proliferative diabetic retinopathy; PDR proliferative diabetic retinopathy; CSME clinically significant macular edema; DME diabetic macular edema; dbh dot blot hemorrhages; CWS cotton wool spot; POAG primary open angle glaucoma; C/D cup-to-disc ratio; HVF humphrey visual field; GVF goldmann visual field; OCT optical coherence tomography; IOP intraocular pressure; BRVO Branch retinal vein occlusion; CRVO central retinal vein occlusion;  CRAO central retinal artery occlusion; BRAO branch retinal artery occlusion; RT retinal tear; SB scleral buckle; PPV pars plana vitrectomy; VH Vitreous hemorrhage; PRP panretinal laser photocoagulation; IVK intravitreal kenalog; VMT vitreomacular traction; MH Macular hole;  NVD neovascularization of the disc; NVE  neovascularization elsewhere; AREDS age related eye disease study; ARMD age related macular degeneration; POAG primary open angle glaucoma; EBMD epithelial/anterior basement membrane dystrophy; ACIOL anterior chamber intraocular lens; IOL intraocular lens; PCIOL posterior chamber intraocular lens; Phaco/IOL phacoemulsification with intraocular lens placement; [place] photorefractive keratectomy; LASIK laser assisted in situ keratomileusis; HTN hypertension; DM diabetes mellitus; COPD chronic obstructive pulmonary disease

## 2019-12-21 ENCOUNTER — Encounter (INDEPENDENT_AMBULATORY_CARE_PROVIDER_SITE_OTHER): Payer: Medicare Other | Admitting: Ophthalmology

## 2019-12-25 ENCOUNTER — Encounter (HOSPITAL_COMMUNITY): Payer: Self-pay | Admitting: Emergency Medicine

## 2019-12-25 ENCOUNTER — Other Ambulatory Visit: Payer: Self-pay

## 2019-12-25 ENCOUNTER — Emergency Department (HOSPITAL_COMMUNITY)
Admission: EM | Admit: 2019-12-25 | Discharge: 2019-12-25 | Disposition: A | Discharge status: Skilled Nursing Facility | Payer: Medicare Other | Attending: Emergency Medicine | Admitting: Emergency Medicine

## 2019-12-25 DIAGNOSIS — I129 Hypertensive chronic kidney disease with stage 1 through stage 4 chronic kidney disease, or unspecified chronic kidney disease: Secondary | ICD-10-CM | POA: Insufficient documentation

## 2019-12-25 DIAGNOSIS — Z951 Presence of aortocoronary bypass graft: Secondary | ICD-10-CM | POA: Insufficient documentation

## 2019-12-25 DIAGNOSIS — I2510 Atherosclerotic heart disease of native coronary artery without angina pectoris: Secondary | ICD-10-CM | POA: Insufficient documentation

## 2019-12-25 DIAGNOSIS — R21 Rash and other nonspecific skin eruption: Secondary | ICD-10-CM | POA: Diagnosis present

## 2019-12-25 DIAGNOSIS — N184 Chronic kidney disease, stage 4 (severe): Secondary | ICD-10-CM | POA: Diagnosis not present

## 2019-12-25 DIAGNOSIS — Z8551 Personal history of malignant neoplasm of bladder: Secondary | ICD-10-CM | POA: Insufficient documentation

## 2019-12-25 DIAGNOSIS — Z79899 Other long term (current) drug therapy: Secondary | ICD-10-CM | POA: Insufficient documentation

## 2019-12-25 DIAGNOSIS — Z91040 Latex allergy status: Secondary | ICD-10-CM | POA: Insufficient documentation

## 2019-12-25 DIAGNOSIS — Z7982 Long term (current) use of aspirin: Secondary | ICD-10-CM | POA: Diagnosis not present

## 2019-12-25 MED ORDER — DIPHENHYDRAMINE HCL 25 MG PO CAPS
25.0000 mg | ORAL_CAPSULE | Freq: Once | ORAL | Status: AC
  Administered 2019-12-25: 25 mg via ORAL
  Filled 2019-12-25: qty 1

## 2019-12-25 MED ORDER — PREDNISONE 20 MG PO TABS
40.0000 mg | ORAL_TABLET | Freq: Once | ORAL | Status: AC
  Administered 2019-12-25: 40 mg via ORAL
  Filled 2019-12-25: qty 2

## 2019-12-25 MED ORDER — PREDNISONE 10 MG PO TABS: 40.0000 mg | ORAL_TABLET | Freq: Every day | ORAL | 0 refills | Status: AC

## 2019-12-25 MED ORDER — DIPHENHYDRAMINE HCL 25 MG PO TABS
25.0000 mg | ORAL_TABLET | Freq: Three times a day (TID) | ORAL | 0 refills | Status: DC | PRN
Start: 2019-12-25 — End: 2020-03-13

## 2019-12-25 NOTE — ED Triage Notes (Signed)
Pt BIBA from [facility]-  Per Ems- Pt c/o itchy rash in BL eyes, BL ears, nose, back and genitalia. Worsening since yesterday. Denies new products of any kind.   AOx4.

## 2019-12-25 NOTE — ED Notes (Signed)
Discharge paperwork reviewed with pt, including prescriptions.  Pt with no questions or concerns at this time.

## 2019-12-25 NOTE — ED Notes (Signed)
[facility] notified of need for transport. 

## 2019-12-25 NOTE — ED Provider Notes (Signed)
[facility] DEPT Provider Note   CSN: [id number] Arrival date & time: 12/25/19  1130     History Chief Complaint  Patient presents with  . Rash    [name] [name] is a 86 y.o. female history of CAD, CKD, seizures, hypertension, macular degeneration, hyperlipidemia, obesity, bladder cancer.  Patient presents today via EMS from assisted living facility for rash.  Patient reports that yesterday afternoon she developed an pruritic rash to her face, shoulders, arms, back and genitalia.  She describes diffuse itching sensation constant no alleviating factors, reports similar in the past but normally improves with cream from the facility.  She denies any difficulty swallowing, throat swelling, cough, shortness of breath, abdominal pain, nausea/vomiting, diarrhea or any additional concerns.  HPI     Past Medical History:  Diagnosis Date  . Abnormal gait   . Asthenia   . Back pain   . Bladder cancer ([place])   . CAD (coronary artery disease)   . Chronic pain   . CKD (chronic kidney disease), stage IV ([place])   . Dizziness   . Dysphagia   . Epileptic grand mal status ([place])   . Essential hypertension, benign   . Hypokalemia   . Insomnia   . Iron deficiency   . Macular degeneration   . Mixed hyperlipidemia   . Obesity   . Postsurgical aortocoronary bypass status     Patient Active Problem List   Diagnosis Date Noted  . Exudative age-related macular degeneration of right eye with inactive choroidal neovascularization ([place]) 12/20/2019  . Degenerative retinal drusen of both eyes 12/20/2019  . Exudative age-related macular degeneration of left eye with inactive choroidal neovascularization ([place]) 12/20/2019  . Traumatic subdural hematoma ([place]) 08/08/2018  . Pyelonephritis 09/09/2016  . Falls frequently 06/24/2016  . Tension headache 11/10/2013  . Chest pain 11/03/2011  . ARF (acute renal failure) ([place]) 04/30/2011  . UTI (urinary tract infection) 04/30/2011    . CKD (chronic kidney disease), stage IV ([place]) 04/30/2011  . Anemia 04/30/2011  . S/p nephrectomy 04/30/2011  . Macular degeneration 04/30/2011  . Cataract 04/30/2011  . Bladder cancer ([place]) 04/30/2011  . Weakness generalized 04/29/2011  . Seizure disorder ([place]) 04/29/2011  . Benign hypertension 04/29/2011  . CAD (coronary artery disease) 04/29/2011  . Hyperlipidemia 04/29/2011  . Anxiety 04/29/2011  . Dizziness - light-headed 04/29/2011  . Chronic abdominal pain 04/29/2011  . Mixed hyperlipidemia 05/24/2008  . Essential hypertension, benign 05/24/2008  . Postsurgical aortocoronary bypass status 05/24/2008    Past Surgical History:  Procedure Laterality Date  . CATARACT EXTRACTION    . CORONARY ARTERY BYPASS GRAFT    . IRIDECTOMY    . NEPHRECTOMY  2000     OB History    Gravida  3   Para  3   Term  3   Preterm      AB      Living  3     SAB      TAB      Ectopic      Multiple      Live Births              Family History  Problem Relation Age of Onset  . Colon cancer Mother   . Heart attack Father   . Diabetes Father   . Stroke Other   . Coronary artery disease Other   . Cancer Other   . Diabetes Other     Social History   Tobacco Use  .  Smoking status: Never Smoker  . Smokeless tobacco: Never Used  Vaping Use  . Vaping Use: Never used  Substance Use Topics  . Alcohol use: No  . Drug use: No    Home Medications Prior to Admission medications   Medication Sig Start Date End Date Taking? Authorizing Provider  acetaminophen (TYLENOL) 325 MG tablet Take 650 mg by mouth every 6 (six) hours as needed for mild pain.     [provider]  amLODipine (NORVASC) 2.5 MG tablet Take 2.5 mg by mouth daily. 10/29/17   [provider]  aspirin EC 81 MG tablet Take 81 mg by mouth daily. Swallow whole.    [provider]  clonazePAM (KLONOPIN) 0.5 MG tablet Take 1 tablet (0.5 mg total) by mouth at bedtime. Patient taking  differently: Take 0.5 mg by mouth 2 (two) times daily.  08/10/18   Domenic Polite, MD  diphenhydrAMINE (BENADRYL) 25 MG tablet Take 1 tablet (25 mg total) by mouth every 8 (eight) hours as needed for itching. 12/25/19   Nuala Alpha A, PA-C  doxycycline (VIBRAMYCIN) 100 MG capsule Take 1 capsule (100 mg total) by mouth 2 (two) times daily. One po bid x 7 days 10/10/19   Drenda Freeze, MD  gabapentin (NEURONTIN) 300 MG capsule Take 1 capsule (300 mg total) by mouth at bedtime. 08/10/18   Domenic Polite, MD  HYDROcodone-acetaminophen [facility]) 10-325 MG tablet Take 1 tablet by mouth every 6 (six) hours as needed for moderate pain or severe pain. Patient not taking: Reported on 10/10/2019 08/10/18   Domenic Polite, MD  HYDROcodone-acetaminophen (NORCO/VICODIN) 5-325 MG tablet Take 1 tablet by mouth every 6 (six) hours as needed for moderate pain.    [provider]  levETIRAcetam (KEPPRA) 250 MG tablet Take 250 mg by mouth 2 (two) times daily.     [provider]  linaclotide (LINZESS) 290 MCG CAPS capsule Take 290 mcg by mouth daily.    [provider]  mirabegron ER (MYRBETRIQ) 25 MG TB24 tablet Take 25 mg by mouth daily.    [provider]  Multiple Vitamins-Minerals (PRESERVISION AREDS PO) Take 1 capsule by mouth daily.    [provider]  polyethylene glycol (MIRALAX / GLYCOLAX) packet Take 17 g by mouth every other day. As needed for constipation.    [provider]  predniSONE (DELTASONE) 10 MG tablet Take 4 tablets (40 mg total) by mouth daily for 4 days. 12/26/19 12/30/19  Nuala Alpha A, PA-C  PRESCRIPTION MEDICATION Apply 1 application topically in the morning and at bedtime. Anti - itch lotion 0.5-0.5% 9Mix with triamcinolone & spread topically to affected areas twice daily.    [provider]  Pumpkin Seed-Soy Germ (AZO BLADDER CONTROL/GO-LESS) CAPS Take 1 capsule by mouth daily.     [provider]  rosuvastatin  (CRESTOR) 10 MG tablet Take 10 mg by mouth daily.  01/06/11   [provider]  sodium zirconium cyclosilicate (LOKELMA) 10 g PACK packet Take 10 g by mouth See admin instructions. Takes twice weekly on Monday and Thursday    [provider]  Suvorexant (BELSOMRA) 10 MG TABS Take 10 mg by mouth daily.    [provider]  tizanidine (ZANAFLEX) 2 MG capsule Take 2 mg by mouth 2 (two) times daily as needed for muscle spasms.    [provider]  triamcinolone cream (KENALOG) 0.1 % Apply 1 application topically 2 (two) times daily. Mix with Sarna lotion    [provider]  Allergies    Adhesive [tape], Keflet [cephalexin], Latex, Penicillins, and Sulfa antibiotics  Review of Systems   Review of Systems Ten systems are reviewed and are negative for acute change except as noted in the HPI  Physical Exam Updated Vital Signs BP (!) 162/89 (BP Location: Right Arm)   Pulse 66   Temp 98.5 F (36.9 C) (Oral)   Resp 14   Ht 5\' 2"  (1.575 m)   Wt 72.6 kg   SpO2 100%   BMI 29.26 kg/m   Physical Exam Exam conducted with a chaperone present Pension scheme manager).  Constitutional:      General: She is not in acute distress.    Appearance: Normal appearance. She is well-developed. She is not ill-appearing or diaphoretic.  HENT:     Head: Normocephalic and atraumatic.     Comments: Mild erythema present around nasal folds and eyelids, no ocular involvement.  No drainage.    Mouth/Throat:     Comments: Airway clear without evidence of desquamation or rash. Eyes:     General: Vision grossly intact. Gaze aligned appropriately.     Extraocular Movements: Extraocular movements intact.     Conjunctiva/sclera: Conjunctivae normal.     Pupils: Pupils are equal, round, and reactive to light.  Neck:     Trachea: Trachea and phonation normal. No tracheal tenderness or tracheal deviation.  Pulmonary:     Effort: Pulmonary effort is normal. No accessory muscle usage or  respiratory distress.     Breath sounds: Normal breath sounds and air entry.  Chest:     Comments: No visible rash Abdominal:     General: There is no distension.     Palpations: Abdomen is soft.     Tenderness: There is no abdominal tenderness. There is no guarding or rebound.     Comments: No visible rash  Genitourinary:      Comments: GU examination chaperoned by Abby RN.  Questionable erythema along the left inguinal crease otherwise normal. Musculoskeletal:        General: Normal range of motion.     Cervical back: Normal range of motion and neck supple.  Skin:    General: Skin is warm and dry.       Neurological:     Mental Status: She is alert.     GCS: GCS eye subscore is 4. GCS verbal subscore is 5. GCS motor subscore is 6.     Comments: Speech is clear and goal oriented, follows commands Major Cranial nerves without deficit, no facial droop Moves extremities without ataxia, coordination intact  Psychiatric:        Behavior: Behavior normal.       ED Results / Procedures / Treatments   Labs (all labs ordered are listed, but only abnormal results are displayed) Labs Reviewed - No data to display  EKG None  Radiology No results found.  Procedures Procedures (including critical care time)  Medications Ordered in ED Medications  diphenhydrAMINE (BENADRYL) capsule 25 mg (has no administration in time range)  predniSONE (DELTASONE) tablet 40 mg (has no administration in time range)    ED Course  I have reviewed the triage vital signs and the nursing notes.  Pertinent labs & imaging results that were available during my care of the patient were reviewed by me and considered in my medical decision making (see chart for details).  Clinical Course as of Dec 24 1249  Sun Dec 25, 2019  1226 [phone]   [BM]  [place]   [  BM]    Clinical Course User Index [BM] Gari Crown   MDM Rules/Calculators/A&P                           Additional history obtained from: 1. Nursing notes from this visit. 2. Nursing facility, spoke with [facility].  She reports that patient has had intermittent itching for quite some time normally they treat with triamcinolone 0.1%.  She developed some itching last night they attempted the ointment without relief, patient requested to be sent to the ER for relief of itching.  No new detergents, foods or any other environmental changes that they are aware of.  No recent illnesses GI symptoms, difficulty breathing or other concerns. ======= Rash consistent with contact dermatitis. Patient denies any difficulty breathing or swallowing.  Pt has a patent airway without stridor and is handling secretions without difficulty; no angioedema. No blisters, no pustules, no warmth, no draining sinus tracts, no superficial abscesses, no bullous impetigo, no vesicles, no desquamation, no target lesions with dusky purpura or a central bulla. Not tender to touch. No concern for superimposed infection. No concern for SJS, TEN, TSS, tick borne illness, syphilis or other life-threatening condition. Will discharge home with short course of steroids and Benadryl.  Patient is nondiabetic no contraindications to medications above.  Patient aware that drowsiness is a side effect of Benadryl and she states understanding.  Indication for lab work or other work-up at this time.  Patient is to follow-up with her primary care doctor next week.  At this time there does not appear to be any evidence of an acute emergency medical condition and the patient appears stable for discharge with appropriate outpatient follow up. Diagnosis was discussed with patient who verbalizes understanding of care plan and is agreeable to discharge. I have discussed return precautions with patient who verbalizes understanding. Patient encouraged to follow-up with their PCP. All questions answered.  Patient was seen and evaluated by Dr. Jeanell Sparrow during this visit  who agrees with treatment plan and discharge.  Note: Portions of this report may have been transcribed using voice recognition software. Every effort was made to ensure accuracy; however, inadvertent computerized transcription errors may still be present. Final Clinical Impression(s) / ED Diagnoses Final diagnoses:  Rash    Rx / DC Orders ED Discharge Orders         Ordered    predniSONE (DELTASONE) 10 MG tablet  Daily        12/25/19 1251    diphenhydrAMINE (BENADRYL) 25 MG tablet  Every 8 hours PRN        12/25/19 1251           Gari Crown 12/25/19 1251    Pattricia Boss, MD 12/25/19 1302

## 2019-12-25 NOTE — Discharge Instructions (Addendum)
At this time there does not appear to be the presence of an emergent medical condition, however there is always the potential for conditions to change. Please read and follow the below instructions.  Please return to the Emergency Department immediately for any new or worsening symptoms. Please be sure to follow up with your Primary Care Provider within one week regarding your visit today; please call their office to schedule an appointment even if you are feeling better for a follow-up visit. Take the medication prednisone as prescribed to help with your rash.  You were given your first dose of prednisone in the ER today, you may take your second dose of 40 mg tomorrow morning.  Please read plenty water and get plenty of rest.  You may also take the medication Benadryl as prescribed to help with your rash, this medication will make you drowsy so do not perform any potentially dangerous activities after taking Benadryl.  Get help right away if: You have a fever and your symptoms suddenly get worse. You start to feel mixed up (confused). You have a very bad headache or a stiff neck. You have very bad joint pains or stiffness. You have jerky movements that you cannot control (seizure). Your rash covers all or most of your body. The rash may or may not be painful. You have blisters that: Are on top of the rash. Grow larger. Grow together. Are painful. Are inside your nose or mouth. You have a rash that: Looks like purple pinprick-sized spots all over your body. Has a "bull's eye" or looks like a target. Is red and painful, causes your skin to peel, and is not from being in the sun too long. You have any new/concerning or worsening of symptoms  Please read the additional information packets attached to your discharge summary.  Do not take your medicine if  develop an itchy rash, swelling in your mouth or lips, or difficulty breathing; call 911 and seek immediate emergency medical attention if  this occurs.  You may review your lab tests and imaging results in their entirety on your MyChart account.  Please discuss all results of fully with your primary care provider and other specialist at your follow-up visit.  Note: Portions of this text may have been transcribed using voice recognition software. Every effort was made to ensure accuracy; however, inadvertent computerized transcription errors may still be present.

## 2020-01-19 ENCOUNTER — Emergency Department (HOSPITAL_COMMUNITY)
Admission: EM | Admit: 2020-01-19 | Discharge: 2020-01-19 | Disposition: A | Discharge status: Home / Self Care | Payer: Medicare Other | Attending: Emergency Medicine | Admitting: Emergency Medicine

## 2020-01-19 ENCOUNTER — Other Ambulatory Visit: Payer: Self-pay

## 2020-01-19 ENCOUNTER — Emergency Department (HOSPITAL_COMMUNITY): Payer: Medicare Other

## 2020-01-19 ENCOUNTER — Encounter (HOSPITAL_COMMUNITY): Payer: Self-pay | Admitting: Emergency Medicine

## 2020-01-19 DIAGNOSIS — N3091 Cystitis, unspecified with hematuria: Secondary | ICD-10-CM | POA: Diagnosis not present

## 2020-01-19 DIAGNOSIS — Z7982 Long term (current) use of aspirin: Secondary | ICD-10-CM | POA: Diagnosis not present

## 2020-01-19 DIAGNOSIS — Z79899 Other long term (current) drug therapy: Secondary | ICD-10-CM | POA: Insufficient documentation

## 2020-01-19 DIAGNOSIS — R109 Unspecified abdominal pain: Secondary | ICD-10-CM | POA: Diagnosis present

## 2020-01-19 DIAGNOSIS — I2510 Atherosclerotic heart disease of native coronary artery without angina pectoris: Secondary | ICD-10-CM | POA: Insufficient documentation

## 2020-01-19 DIAGNOSIS — Z91040 Latex allergy status: Secondary | ICD-10-CM | POA: Insufficient documentation

## 2020-01-19 DIAGNOSIS — Z8551 Personal history of malignant neoplasm of bladder: Secondary | ICD-10-CM | POA: Insufficient documentation

## 2020-01-19 DIAGNOSIS — I129 Hypertensive chronic kidney disease with stage 1 through stage 4 chronic kidney disease, or unspecified chronic kidney disease: Secondary | ICD-10-CM | POA: Insufficient documentation

## 2020-01-19 DIAGNOSIS — N184 Chronic kidney disease, stage 4 (severe): Secondary | ICD-10-CM | POA: Insufficient documentation

## 2020-01-19 DIAGNOSIS — Z951 Presence of aortocoronary bypass graft: Secondary | ICD-10-CM | POA: Diagnosis not present

## 2020-01-19 LAB — COMPREHENSIVE METABOLIC PANEL
ALT: 9 U/L (ref 0–44)
AST: 14 U/L — ABNORMAL LOW (ref 15–41)
Albumin: 2.7 g/dL — ABNORMAL LOW (ref 3.5–5.0)
Alkaline Phosphatase: 67 U/L (ref 38–126)
Anion gap: 9 (ref 5–15)
BUN: 32 mg/dL — ABNORMAL HIGH (ref 8–23)
CO2: 22 mmol/L (ref 22–32)
Calcium: 7.7 mg/dL — ABNORMAL LOW (ref 8.9–10.3)
Chloride: 112 mmol/L — ABNORMAL HIGH (ref 98–111)
Creatinine, Ser: 2.51 mg/dL — ABNORMAL HIGH (ref 0.44–1.00)
GFR, Estimated: 16 mL/min — ABNORMAL LOW (ref >60)
Glucose, Bld: 101 mg/dL — ABNORMAL HIGH (ref 70–99)
Potassium: 3.8 mmol/L (ref 3.5–5.1)
Sodium: 143 mmol/L (ref 135–145)
Total Bilirubin: 0.8 mg/dL (ref 0.3–1.2)
Total Protein: 5.8 g/dL — ABNORMAL LOW (ref 6.5–8.1)

## 2020-01-19 LAB — CBC WITH DIFFERENTIAL/PLATELET
Abs Immature Granulocytes: 0.02 10*3/uL (ref 0.00–0.07)
Basophils Absolute: 0.1 10*3/uL (ref 0.0–0.1)
Basophils Relative: 1 %
Eosinophils Absolute: 0.1 10*3/uL (ref 0.0–0.5)
Eosinophils Relative: 2 %
HCT: 36.7 % (ref 36.0–46.0)
Hemoglobin: 11.4 g/dL — ABNORMAL LOW (ref 12.0–15.0)
Immature Granulocytes: 0 %
Lymphocytes Relative: 28 %
Lymphs Abs: 2.2 10*3/uL (ref 0.7–4.0)
MCH: 29.7 pg (ref 26.0–34.0)
MCHC: 31.1 g/dL (ref 30.0–36.0)
MCV: 95.6 fL (ref 80.0–100.0)
Monocytes Absolute: 0.9 10*3/uL (ref 0.1–1.0)
Monocytes Relative: 11 %
Neutro Abs: 4.6 10*3/uL (ref 1.7–7.7)
Neutrophils Relative %: 58 %
Platelets: 177 10*3/uL (ref 150–400)
RBC: 3.84 MIL/uL — ABNORMAL LOW (ref 3.87–5.11)
RDW: 13.9 % (ref 11.5–15.5)
WBC: 7.8 10*3/uL (ref 4.0–10.5)
nRBC: 0 % (ref 0.0–0.2)

## 2020-01-19 LAB — URINALYSIS, ROUTINE W REFLEX MICROSCOPIC
Bilirubin Urine: NEGATIVE
Glucose, UA: NEGATIVE mg/dL
Ketones, ur: NEGATIVE mg/dL
Nitrite: NEGATIVE
Protein, ur: 100 mg/dL — AB
RBC / HPF: >50 RBC/hpf — ABNORMAL HIGH (ref 0–5)
Specific Gravity, Urine: 1.009 (ref 1.005–1.030)
WBC, UA: >50 WBC/hpf — ABNORMAL HIGH (ref 0–5)
pH: 6 (ref 5.0–8.0)

## 2020-01-19 LAB — LIPASE, BLOOD: Lipase: 32 U/L (ref 11–51)

## 2020-01-19 LAB — POC OCCULT BLOOD, ED: Fecal Occult Bld: NEGATIVE

## 2020-01-19 MED ORDER — SODIUM CHLORIDE 0.9 % IV SOLN
1.0000 g | Freq: Once | INTRAVENOUS | Status: AC
  Administered 2020-01-19: 1 g via INTRAVENOUS
  Filled 2020-01-19: qty 10

## 2020-01-19 MED ORDER — CIPROFLOXACIN HCL 500 MG PO TABS: 500.0000 mg | ORAL_TABLET | Freq: Every day | ORAL | 0 refills | Status: AC

## 2020-01-19 NOTE — ED Notes (Signed)
Pt provided [place] sandwich per her request.

## 2020-01-19 NOTE — ED Notes (Signed)
Family states they would come and pick up patient and transport back to facility

## 2020-01-19 NOTE — ED Notes (Signed)
Purewick placed on pt. 

## 2020-01-19 NOTE — ED Notes (Signed)
Pt contact [name] [name] notified. Pt provided verbal to provide update.

## 2020-01-19 NOTE — ED Triage Notes (Signed)
BIBA Per EMS: Pt coming from [facility] with complaints of generalized abd pain  With occasional sharp shooting pains radiating from R lower quadrant to L lower quadrant.  Pt does have Hernia on R side Per EMS staff stated pt has been having bleeding in her diaper X1 day. This bleeding per staff was described as "menstural cycle" like bleeding.  Vitals: 163/67 77 HR 16 RR 96% room air 97.8 Temp 22 L AC 100 mcg fentanyl  Pain went from 10/10 to 3/10

## 2020-01-19 NOTE — ED Notes (Signed)
[facility] notified of need for transport. 

## 2020-01-19 NOTE — ED Provider Notes (Signed)
[place] EMERGENCY DEPARTMENT Provider Note  CSN: [id number] Arrival date & time: 01/19/20 1051    History Chief Complaint  Patient presents with  . Abdominal Pain  . Vaginal Bleeding    HPI  [name] [name] is a 86 y.o. female brought to the ED via EMS from [facility] for evaluation of 2 days of diffuse, severe, cramping abdominal pain. Not associated with vomiting. She reports she fell a few days ago trying to sit on the toilet and landed on her bottom. She has had pain since. She was also reported to have had blood in her adult diaper, although it isn't clear what the source was. The patient reports some dysuria, but denies fever.    Past Medical History:  Diagnosis Date  . Abnormal gait   . Asthenia   . Back pain   . Bladder cancer ([place])   . CAD (coronary artery disease)   . Chronic pain   . CKD (chronic kidney disease), stage IV ([place])   . Dizziness   . Dysphagia   . 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 Essential hypertension, benign   . Hypokalemia   . Insomnia   . Iron deficiency   . Macular degeneration   . Mixed hyperlipidemia   . Obesity   . Postsurgical aortocoronary bypass status     Past Surgical History:  Procedure Laterality Date  . CATARACT EXTRACTION    . CORONARY ARTERY BYPASS GRAFT    . IRIDECTOMY    . NEPHRECTOMY  2000    Family History  Problem Relation Age of Onset  . Colon cancer Mother   . Heart attack Father   . Diabetes Father   . Stroke Other   . Coronary artery disease Other   . Cancer Other   . Diabetes Other     Social History   Tobacco Use  . Smoking status: Never Smoker  . Smokeless tobacco: Never Used  Vaping Use  . Vaping Use: Never used  Substance Use Topics  . Alcohol use: No  . Drug use: No     Home Medications Prior to Admission medications   Medication Sig Start Date End Date Taking? Authorizing Provider  acetaminophen (TYLENOL) 325 MG tablet Take 650 mg by mouth every 6 (six) hours as needed for mild pain.      [provider]  amLODipine (NORVASC) 2.5 MG tablet Take 2.5 mg by mouth daily. 10/29/17   [provider]  aspirin EC 81 MG tablet Take 81 mg by mouth daily. Swallow whole.    [provider]  ciprofloxacin (CIPRO) 500 MG tablet Take 1 tablet (500 mg total) by mouth daily with breakfast for 10 days. 01/19/20 01/29/20  Truddie Hidden, MD  clonazePAM (KLONOPIN) 0.5 MG tablet Take 1 tablet (0.5 mg total) by mouth at bedtime. Patient taking differently: Take 0.5 mg by mouth 2 (two) times daily.  08/10/18   Domenic Polite, MD  diphenhydrAMINE (BENADRYL) 25 MG tablet Take 1 tablet (25 mg total) by mouth every 8 (eight) hours as needed for itching. 12/25/19   Nuala Alpha A, PA-C  gabapentin (NEURONTIN) 300 MG capsule Take 1 capsule (300 mg total) by mouth at bedtime. 08/10/18   Domenic Polite, MD  HYDROcodone-acetaminophen [facility]) 10-325 MG tablet Take 1 tablet by mouth every 6 (six) hours as needed for moderate pain or severe pain. Patient not taking: Reported on 10/10/2019 08/10/18   Domenic Polite, MD  HYDROcodone-acetaminophen (NORCO/VICODIN) 5-325 MG tablet Take 1  tablet by mouth every 6 (six) hours as needed for moderate pain.    [provider]  levETIRAcetam (KEPPRA) 250 MG tablet Take 250 mg by mouth 2 (two) times daily.     [provider]  linaclotide (LINZESS) 290 MCG CAPS capsule Take 290 mcg by mouth daily.    [provider]  mirabegron ER (MYRBETRIQ) 25 MG TB24 tablet Take 25 mg by mouth daily.    [provider]  Multiple Vitamins-Minerals (PRESERVISION AREDS PO) Take 1 capsule by mouth daily.    [provider]  polyethylene glycol (MIRALAX / GLYCOLAX) packet Take 17 g by mouth every other day. As needed for constipation.    [provider]  PRESCRIPTION MEDICATION Apply 1 application topically in the morning and at bedtime. Anti - itch lotion 0.5-0.5% 9Mix with triamcinolone & spread topically to  affected areas twice daily.    [provider]  Pumpkin Seed-Soy Germ (AZO BLADDER CONTROL/GO-LESS) CAPS Take 1 capsule by mouth daily.     [provider]  rosuvastatin (CRESTOR) 10 MG tablet Take 10 mg by mouth daily.  01/06/11   [provider]  sodium zirconium cyclosilicate (LOKELMA) 10 g PACK packet Take 10 g by mouth See admin instructions. Takes twice weekly on Monday and Thursday    [provider]  Suvorexant (BELSOMRA) 10 MG TABS Take 10 mg by mouth daily.    [provider]  tizanidine (ZANAFLEX) 2 MG capsule Take 2 mg by mouth 2 (two) times daily as needed for muscle spasms.    [provider]  triamcinolone cream (KENALOG) 0.1 % Apply 1 application topically 2 (two) times daily. Mix with Sarna lotion    [provider]     Allergies    Adhesive [tape], Keflet [cephalexin], Latex, Penicillins, and Sulfa antibiotics   Review of Systems   Review of Systems A comprehensive review of systems was completed and negative except as noted in HPI.    Physical Exam BP (!) 155/69   Pulse 72   Temp 98.8 F (37.1 C) (Oral)   Resp 18   Ht 5\' 2"  (1.575 m)   Wt 72.6 kg   SpO2 97%   BMI 29.26 kg/m   Physical Exam Vitals and nursing note reviewed.  Constitutional:      Appearance: Normal appearance.  HENT:     Head: Normocephalic and atraumatic.     Nose: Nose normal.     Mouth/Throat:     Mouth: Mucous membranes are moist.  Eyes:     Extraocular Movements: Extraocular movements intact.     Conjunctiva/sclera: Conjunctivae normal.  Cardiovascular:     Rate and Rhythm: Normal rate.  Pulmonary:     Effort: Pulmonary effort is normal.     Breath sounds: Normal breath sounds.  Abdominal:     General: Abdomen is flat.     Palpations: Abdomen is soft.     Tenderness: There is generalized abdominal tenderness. There is guarding.  Genitourinary:    Vagina: Normal. No bleeding.     Rectum: No mass or tenderness.    Musculoskeletal:        General: No swelling. Normal range of motion.     Cervical back: Neck supple.  Skin:    General: Skin is warm and dry.  Neurological:     General: No focal deficit present.     Mental Status: She is alert.  Psychiatric:        Mood and Affect: Mood  normal.      ED Results / Procedures / Treatments   Labs (all labs ordered are listed, but only abnormal results are displayed) Labs Reviewed  COMPREHENSIVE METABOLIC PANEL - Abnormal; Notable for the following components:      Result Value   Chloride 112 (*)    Glucose, Bld 101 (*)    BUN 32 (*)    Creatinine, Ser 2.51 (*)    Calcium 7.7 (*)    Total Protein 5.8 (*)    Albumin 2.7 (*)    AST 14 (*)    GFR, Estimated 16 (*)    All other components within normal limits  CBC WITH DIFFERENTIAL/PLATELET - Abnormal; Notable for the following components:   RBC 3.84 (*)    Hemoglobin 11.4 (*)    All other components within normal limits  URINALYSIS, ROUTINE W REFLEX MICROSCOPIC - Abnormal; Notable for the following components:   APPearance CLOUDY (*)    Hgb urine dipstick LARGE (*)    Protein, ur 100 (*)    Leukocytes,Ua LARGE (*)    RBC / HPF >50 (*)    WBC, UA >50 (*)    Bacteria, UA MANY (*)    All other components within normal limits  URINE CULTURE  LIPASE, BLOOD  POC OCCULT BLOOD, ED    EKG None  Radiology CT Abdomen Pelvis Wo Contrast  Result Date: 01/19/2020 CLINICAL DATA:  Generalized abdominal pain. EXAM: CT ABDOMEN AND PELVIS WITHOUT CONTRAST TECHNIQUE: Multidetector CT imaging of the abdomen and pelvis was performed following the standard protocol without IV contrast. COMPARISON:  October 10, 2019. FINDINGS: Lower chest: No acute abnormality. Hepatobiliary: No focal liver abnormality is seen. No gallstones, gallbladder wall thickening, or biliary dilatation. Pancreas: Unremarkable. No pancreatic ductal dilatation or surrounding inflammatory changes. Spleen: Normal in size without focal  abnormality. Adrenals/Urinary Tract: Adrenal glands appear normal. Status post right nephrectomy. Mild left hydroureteronephrosis is noted without obstructing calculus. There is again noted large right posterolateral bladder diverticulum. Stomach/Bowel: The stomach appears normal. There is no evidence of bowel obstruction or inflammation. Diverticulosis of sigmoid colon is noted without inflammation. Vascular/Lymphatic: Aortic atherosclerosis. No enlarged abdominal or pelvic lymph nodes. Reproductive: Status post hysterectomy. No adnexal masses. Other: No abdominal wall hernia or abnormality. No abdominopelvic ascites. Musculoskeletal: Status post L1 kyphoplasty. No acute osseous abnormality is noted. IMPRESSION: 1. Mild left hydroureteronephrosis is noted without obstructing calculus. This may be due to recently passed stone or possibly infection. 2. Diverticulosis of sigmoid colon without inflammation. 3. Status post right nephrectomy. 4. Large right posterolateral bladder diverticulum is noted. 5. Aortic atherosclerosis. Aortic Atherosclerosis (ICD10-I70.0). Electronically Signed   By: Marijo Conception M.D.   On: 01/19/2020 12:34    Procedures Procedures  Medications Ordered in the ED Medications  cefTRIAXone (ROCEPHIN) 1 g in sodium chloride 0.9 % 100 mL IVPB (has no administration in time range)     MDM Rules/Calculators/A&P MDM Patient with diffuse abdominal pain, reportedly had some bleeding in her diaper, but none seen on external genital exam. Will check hemoccult (soft brown stool on DRE) and UA.  ED Course  I have reviewed the triage vital signs and the nursing notes.  Pertinent labs & imaging results that were available during my care of the patient were reviewed by me and considered in my medical decision making (see chart for details).  Clinical Course as of Jan 18 1449  Thu Jan 19, 2020  1140 Hemoccult is neg.    [CS]  1150  CBC is normal.    [CS]  0370 CMP is at baseline.     [CS]  9643 UA with infection and blood. She continues to rest comfortably. CT neg for acute surgical process. Possibly a recent kidney stone vs infection. Will give a dose of Rocephin. Given signs of proximal infection and history of intolerance to oral Keflex will give renally doses Cipro pending cultures.    [CS]    Clinical Course User Index [CS] Truddie Hidden, MD    Final Clinical Impression(s) / ED Diagnoses Final diagnoses:  Hemorrhagic cystitis    Rx / DC Orders ED Discharge Orders         Ordered    ciprofloxacin (CIPRO) 500 MG tablet  Daily with breakfast        01/19/20 1450           Truddie Hidden, MD 01/19/20 1450

## 2020-01-22 LAB — URINE CULTURE: Culture: >=100000 — AB

## 2020-01-23 ENCOUNTER — Telehealth: Payer: Self-pay | Admitting: *Deleted

## 2020-01-23 NOTE — Telephone Encounter (Signed)
Post ED Visit - Positive Culture Follow-up  Culture report reviewed by antimicrobial stewardship pharmacist: [place] Team []  Elenor Quinones, Pharm.D. []  Heide Guile, Pharm.D., BCPS AQ-ID []  Parks Neptune, Pharm.D., BCPS []  Alycia Rossetti, Pharm.D., BCPS []  [place], Pharm.D., BCPS, AAHIVP []  Legrand Como, Pharm.D., BCPS, AAHIVP []  Salome Arnt, PharmD, BCPS []  Johnnette Gourd, PharmD, BCPS []  Hughes Better, PharmD, BCPS []  Leeroy Cha, PharmD []  Laqueta Linden, PharmD, BCPS []  Albertina Parr, PharmD  [place] Team []  Leodis Sias, PharmD []  Lindell Spar, PharmD []  Royetta Asal, PharmD []  Graylin Shiver, Rph []  Rema Fendt) Glennon Mac, PharmD []  Arlyn Dunning, PharmD []  Netta Cedars, PharmD []  Dia Sitter, PharmD []  Leone Haven, PharmD []  Gretta Arab, PharmD []  Theodis Shove, PharmD []  Peggyann Juba, PharmD [x]  Reuel Boom, PharmD   Positive urine culture Suspect colonization and hematuria and no further patient follow-up is required at this time.  Harlon Flor [facility] 01/23/2020, 12:06 PM

## 2020-03-11 ENCOUNTER — Other Ambulatory Visit: Payer: Self-pay

## 2020-03-11 ENCOUNTER — Inpatient Hospital Stay (HOSPITAL_COMMUNITY)
Admission: EM | Admit: 2020-03-11 | Discharge: 2020-03-13 | DRG: 690 | Disposition: A | Discharge status: Home Health | Payer: Medicare Other | Source: Skilled Nursing Facility | Attending: Internal Medicine | Admitting: Internal Medicine

## 2020-03-11 ENCOUNTER — Observation Stay (HOSPITAL_COMMUNITY): Payer: Medicare Other

## 2020-03-11 DIAGNOSIS — Z800 Family history of malignant neoplasm of digestive organs: Secondary | ICD-10-CM

## 2020-03-11 DIAGNOSIS — Z7982 Long term (current) use of aspirin: Secondary | ICD-10-CM

## 2020-03-11 DIAGNOSIS — Z888 Allergy status to other drugs, medicaments and biological substances status: Secondary | ICD-10-CM

## 2020-03-11 DIAGNOSIS — N3020 Other chronic cystitis without hematuria: Principal | ICD-10-CM | POA: Diagnosis present

## 2020-03-11 DIAGNOSIS — Z66 Do not resuscitate: Secondary | ICD-10-CM | POA: Diagnosis present

## 2020-03-11 DIAGNOSIS — Z833 Family history of diabetes mellitus: Secondary | ICD-10-CM

## 2020-03-11 DIAGNOSIS — Z905 Acquired absence of kidney: Secondary | ICD-10-CM

## 2020-03-11 DIAGNOSIS — G40909 Epilepsy, unspecified, not intractable, without status epilepticus: Secondary | ICD-10-CM | POA: Diagnosis present

## 2020-03-11 DIAGNOSIS — I10 Essential (primary) hypertension: Secondary | ICD-10-CM | POA: Diagnosis present

## 2020-03-11 DIAGNOSIS — N184 Chronic kidney disease, stage 4 (severe): Secondary | ICD-10-CM | POA: Diagnosis present

## 2020-03-11 DIAGNOSIS — G8929 Other chronic pain: Secondary | ICD-10-CM | POA: Diagnosis present

## 2020-03-11 DIAGNOSIS — N323 Diverticulum of bladder: Secondary | ICD-10-CM | POA: Diagnosis present

## 2020-03-11 DIAGNOSIS — Z20822 Contact with and (suspected) exposure to covid-19: Secondary | ICD-10-CM | POA: Diagnosis present

## 2020-03-11 DIAGNOSIS — N390 Urinary tract infection, site not specified: Secondary | ICD-10-CM | POA: Diagnosis not present

## 2020-03-11 DIAGNOSIS — G4700 Insomnia, unspecified: Secondary | ICD-10-CM | POA: Diagnosis present

## 2020-03-11 DIAGNOSIS — Z882 Allergy status to sulfonamides status: Secondary | ICD-10-CM

## 2020-03-11 DIAGNOSIS — I129 Hypertensive chronic kidney disease with stage 1 through stage 4 chronic kidney disease, or unspecified chronic kidney disease: Secondary | ICD-10-CM | POA: Diagnosis present

## 2020-03-11 DIAGNOSIS — N179 Acute kidney failure, unspecified: Secondary | ICD-10-CM | POA: Diagnosis not present

## 2020-03-11 DIAGNOSIS — B9620 Unspecified Escherichia coli [E. coli] as the cause of diseases classified elsewhere: Secondary | ICD-10-CM | POA: Diagnosis present

## 2020-03-11 DIAGNOSIS — Z880 Allergy status to penicillin: Secondary | ICD-10-CM

## 2020-03-11 DIAGNOSIS — C679 Malignant neoplasm of bladder, unspecified: Secondary | ICD-10-CM | POA: Diagnosis present

## 2020-03-11 DIAGNOSIS — E875 Hyperkalemia: Secondary | ICD-10-CM | POA: Diagnosis present

## 2020-03-11 DIAGNOSIS — Z8551 Personal history of malignant neoplasm of bladder: Secondary | ICD-10-CM

## 2020-03-11 DIAGNOSIS — Z8249 Family history of ischemic heart disease and other diseases of the circulatory system: Secondary | ICD-10-CM

## 2020-03-11 DIAGNOSIS — Z823 Family history of stroke: Secondary | ICD-10-CM

## 2020-03-11 DIAGNOSIS — N1330 Unspecified hydronephrosis: Secondary | ICD-10-CM | POA: Diagnosis present

## 2020-03-11 DIAGNOSIS — Z79899 Other long term (current) drug therapy: Secondary | ICD-10-CM

## 2020-03-11 DIAGNOSIS — Z951 Presence of aortocoronary bypass graft: Secondary | ICD-10-CM

## 2020-03-11 DIAGNOSIS — E782 Mixed hyperlipidemia: Secondary | ICD-10-CM | POA: Diagnosis present

## 2020-03-11 DIAGNOSIS — I2510 Atherosclerotic heart disease of native coronary artery without angina pectoris: Secondary | ICD-10-CM | POA: Diagnosis present

## 2020-03-11 LAB — CBC WITH DIFFERENTIAL/PLATELET
Abs Immature Granulocytes: 0.02 10*3/uL (ref 0.00–0.07)
Basophils Absolute: 0.1 10*3/uL (ref 0.0–0.1)
Basophils Relative: 1 %
Eosinophils Absolute: 0.3 10*3/uL (ref 0.0–0.5)
Eosinophils Relative: 4 %
HCT: 41.1 % (ref 36.0–46.0)
Hemoglobin: 12.2 g/dL (ref 12.0–15.0)
Immature Granulocytes: 0 %
Lymphocytes Relative: 36 %
Lymphs Abs: 2.4 10*3/uL (ref 0.7–4.0)
MCH: 29.5 pg (ref 26.0–34.0)
MCHC: 29.7 g/dL — ABNORMAL LOW (ref 30.0–36.0)
MCV: 99.3 fL (ref 80.0–100.0)
Monocytes Absolute: 0.7 10*3/uL (ref 0.1–1.0)
Monocytes Relative: 10 %
Neutro Abs: 3.2 10*3/uL (ref 1.7–7.7)
Neutrophils Relative %: 49 %
Platelets: 159 10*3/uL (ref 150–400)
RBC: 4.14 MIL/uL (ref 3.87–5.11)
RDW: 14.7 % (ref 11.5–15.5)
WBC: 6.6 10*3/uL (ref 4.0–10.5)
nRBC: 0 % (ref 0.0–0.2)

## 2020-03-11 LAB — COMPREHENSIVE METABOLIC PANEL
ALT: 12 U/L (ref 0–44)
AST: 27 U/L (ref 15–41)
Albumin: 3.8 g/dL (ref 3.5–5.0)
Alkaline Phosphatase: 78 U/L (ref 38–126)
Anion gap: 11 (ref 5–15)
BUN: 42 mg/dL — ABNORMAL HIGH (ref 8–23)
CO2: 18 mmol/L — ABNORMAL LOW (ref 22–32)
Calcium: 9.2 mg/dL (ref 8.9–10.3)
Chloride: 113 mmol/L — ABNORMAL HIGH (ref 98–111)
Creatinine, Ser: 3.63 mg/dL — ABNORMAL HIGH (ref 0.44–1.00)
GFR, Estimated: 11 mL/min — ABNORMAL LOW (ref >60)
Glucose, Bld: 102 mg/dL — ABNORMAL HIGH (ref 70–99)
Potassium: 5.5 mmol/L — ABNORMAL HIGH (ref 3.5–5.1)
Sodium: 142 mmol/L (ref 135–145)
Total Bilirubin: 0.5 mg/dL (ref 0.3–1.2)
Total Protein: 7.5 g/dL (ref 6.5–8.1)

## 2020-03-11 LAB — URINALYSIS, ROUTINE W REFLEX MICROSCOPIC
Bilirubin Urine: NEGATIVE
Glucose, UA: NEGATIVE mg/dL
Ketones, ur: NEGATIVE mg/dL
Nitrite: POSITIVE — AB
Protein, ur: 100 mg/dL — AB
Specific Gravity, Urine: 1.011 (ref 1.005–1.030)
WBC, UA: >50 WBC/hpf — ABNORMAL HIGH (ref 0–5)
pH: 5 (ref 5.0–8.0)

## 2020-03-11 LAB — LACTIC ACID, PLASMA
Lactic Acid, Venous: 1.3 mmol/L (ref 0.5–1.9)
Lactic Acid, Venous: 1.4 mmol/L (ref 0.5–1.9)

## 2020-03-11 LAB — PROTIME-INR
INR: 1.1 (ref 0.8–1.2)
Prothrombin Time: 13.3 seconds (ref 11.4–15.2)

## 2020-03-11 LAB — RESP PANEL BY RT-PCR (FLU A&B, COVID) ARPGX2
Influenza A by PCR: NEGATIVE
Influenza B by PCR: NEGATIVE
SARS Coronavirus 2 by RT PCR: NEGATIVE

## 2020-03-11 LAB — LIPASE, BLOOD: Lipase: 41 U/L (ref 11–51)

## 2020-03-11 MED ORDER — ASPIRIN EC 81 MG PO TBEC
81.0000 mg | DELAYED_RELEASE_TABLET | Freq: Every day | ORAL | Status: DC
  Filled 2020-03-11: qty 1

## 2020-03-11 MED ORDER — ACETAMINOPHEN 650 MG RE SUPP: 650.0000 mg | Freq: Four times a day (QID) | RECTAL | Status: DC | PRN

## 2020-03-11 MED ORDER — AMLODIPINE BESYLATE 5 MG PO TABS
2.5000 mg | ORAL_TABLET | Freq: Every day | ORAL | Status: DC
  Administered 2020-03-11 – 2020-03-12 (×2): 2.5 mg via ORAL
  Filled 2020-03-11 (×2): qty 1

## 2020-03-11 MED ORDER — SODIUM CHLORIDE 0.9 % IV SOLN
1.0000 g | Freq: Once | INTRAVENOUS | Status: AC
  Administered 2020-03-11: 1 g via INTRAVENOUS
  Filled 2020-03-11: qty 1

## 2020-03-11 MED ORDER — ROSUVASTATIN CALCIUM 10 MG PO TABS
10.0000 mg | ORAL_TABLET | Freq: Every day | ORAL | Status: DC
  Administered 2020-03-11 – 2020-03-13 (×3): 10 mg via ORAL
  Filled 2020-03-11 (×4): qty 1

## 2020-03-11 MED ORDER — OXYCODONE HCL 5 MG PO TABS
2.5000 mg | ORAL_TABLET | Freq: Four times a day (QID) | ORAL | Status: DC | PRN
  Administered 2020-03-11 – 2020-03-13 (×4): 5 mg via ORAL
  Filled 2020-03-11 (×4): qty 1

## 2020-03-11 MED ORDER — CLONAZEPAM 0.125 MG PO TBDP: 0.2500 mg | ORAL_TABLET | Freq: Two times a day (BID) | ORAL | Status: DC | PRN

## 2020-03-11 MED ORDER — ACETAMINOPHEN 325 MG PO TABS
650.0000 mg | ORAL_TABLET | Freq: Four times a day (QID) | ORAL | Status: DC | PRN
  Administered 2020-03-13: 14:00:00 650 mg via ORAL
  Filled 2020-03-11: qty 2

## 2020-03-11 MED ORDER — LEVETIRACETAM 250 MG PO TABS
250.0000 mg | ORAL_TABLET | Freq: Two times a day (BID) | ORAL | Status: DC
  Administered 2020-03-11 – 2020-03-13 (×4): 250 mg via ORAL
  Filled 2020-03-11 (×5): qty 1

## 2020-03-11 MED ORDER — SODIUM CHLORIDE 0.9 % IV BOLUS
1000.0000 mL | Freq: Once | INTRAVENOUS | Status: AC
  Administered 2020-03-11: 1000 mL via INTRAVENOUS

## 2020-03-11 MED ORDER — ASPIRIN EC 81 MG PO TBEC
81.0000 mg | DELAYED_RELEASE_TABLET | Freq: Every day | ORAL | Status: DC
  Administered 2020-03-12 – 2020-03-13 (×2): 81 mg via ORAL
  Filled 2020-03-11 (×2): qty 1

## 2020-03-11 NOTE — H&P (Addendum)
History and Physical    [name] [name]:[id number] DOB: 04-Jun-1928 DOA: 03/11/2020  PCP: Celene Squibb, MD  Patient coming from: I think a nursing facility Chief Complaint: Abdominal pain, suprapubic pain  HPI: [name] [name] is a 86 y.o. female with a pertinent history of seizure disorder on Keppra, hypertension, CKD stage IV s/p right-sided nephrectomy, bladder cancer who presents to the ED with generalized abdominal pain, suprapubic pain, diarrhea, nausea and anorexia.  She thinks with the usual UTIs that she has not this tired and fatigued.  She has been weak 3 to 4 days.  She does not think she has Covid, she does not have any exposures to anyone else sick.  She states that most the time she is in a wheelchair.  She presented the other day to her doctor at her nursing facility and was diagnosed with "a UTI "and started on something the patient was allergic to and she did not end up taking it.  She states since this time her abdominal pain has gotten progressively worse with dysuria and she is became more somnolent.  She states over the last 1 or 2 days she really has not eaten much and has had associated diarrhea as well.  In the emergency department she is hemodynamically stable but hypertensive because of the pain,, temperature 97.9, 98% on room air, HR 65, RR 18, WBC 6.6, Hgb 12.2, PLT 159, K5.5, creatinine 3.60 with baseline's in the twos, CO2 18, calcium 9.2, lactic acid 1.4, INR 1.1  Of note, she might have had a similar presentation after a fall a month or so ago after reviewing the ED visits which she has had multiple  Review of Systems: As per HPI otherwise 10 point review of systems negative.  Other pertinents as below:  General -denies any headaches or visual changes or photophobia, has felt quite weak HEENT -denies any new headaches or visual changes Cardio -denies any chest pain, palpitations Resp -denies any cough or shortness of breath or signs of upper or lower respiratory  infection GI -diarrhea as above, nausea and not wanting to eat, anorexia, does not have her appendix but still has her gallbladder GU -I think difficulties with urination in the past and history UTIs MSK -no new joint or back pain Skin -no new skin lesions or rashes Neuro -denies any new numbness or weakness Psych -feels quite fatigued but denies any new depression or anxiety   Past Medical History:  Diagnosis Date  . Abnormal gait   . Asthenia   . Back pain   . Bladder cancer ([place])   . CAD (coronary artery disease)   . Chronic pain   . CKD (chronic kidney disease), stage IV ([place])   . Dizziness   . Dysphagia   . Epileptic grand mal status ([place])   . Essential hypertension, benign   . Hypokalemia   . Insomnia   . Iron deficiency   . Macular degeneration   . Mixed hyperlipidemia   . Obesity   . Postsurgical aortocoronary bypass status     Past Surgical History:  Procedure Laterality Date  . CATARACT EXTRACTION    . CORONARY ARTERY BYPASS GRAFT    . IRIDECTOMY    . NEPHRECTOMY  2000     reports that she has never smoked. She has never used smokeless tobacco. She reports that she does not drink alcohol and does not use drugs.  Allergies  Allergen Reactions  . Adhesive [Tape] Other (See Comments)  Tears patients skin off.   [name] [Cephalexin] Nausea And Vomiting  . Latex Itching  . Penicillins Hives and Itching  . Sulfa Antibiotics Hives and Itching    Family History  Problem Relation Age of Onset  . Colon cancer Mother   . Heart attack Father   . Diabetes Father   . Stroke Other   . Coronary artery disease Other   . Cancer Other   . Diabetes Other      Prior to Admission medications   Medication Sig Start Date End Date Taking? Authorizing Provider  acetaminophen (TYLENOL) 325 MG tablet Take 650 mg by mouth every 8 (eight) hours. With hydrocodone   Yes [provider]  amLODipine (NORVASC) 2.5 MG tablet Take 2.5 mg by mouth daily. 10/29/17  Yes  [provider]  aspirin EC 81 MG tablet Take 81 mg by mouth daily. Swallow whole.   Yes [provider]  clonazePAM (KLONOPIN) 0.5 MG tablet Take 1 tablet (0.5 mg total) by mouth at bedtime. Patient taking differently: Take 0.5 mg by mouth 2 (two) times daily.  08/10/18  Yes Domenic Polite, MD  diphenhydrAMINE [facility]) 25 mg capsule Take 25 mg by mouth every 8 (eight) hours as needed for itching.   Yes [provider]  gabapentin (NEURONTIN) 300 MG capsule Take 1 capsule (300 mg total) by mouth at bedtime. 08/10/18  Yes Domenic Polite, MD  HYDROcodone-acetaminophen (NORCO/VICODIN) 5-325 MG tablet Take 1 tablet by mouth every 8 (eight) hours.    Yes [provider]  levETIRAcetam (KEPPRA) 250 MG tablet Take 250 mg by mouth 2 (two) times daily.    Yes [provider]  linaclotide (LINZESS) 290 MCG CAPS capsule Take 290 mcg by mouth daily.   Yes [provider]  mirabegron ER (MYRBETRIQ) 25 MG TB24 tablet Take 25 mg by mouth daily.   Yes [provider]  Multiple Vitamins-Minerals (PRESERVISION AREDS PO) Take 1 capsule by mouth daily.   Yes [provider]  NONFORMULARY OR COMPOUNDED ITEM Apply 1 application topically in the morning and at bedtime. Anti -itch lotion 0.5-0.5% ( Mix with triamcinolone)   Yes [provider]  polyethylene glycol (MIRALAX / GLYCOLAX) packet Take 17 g by mouth every other day. As needed for constipation.   Yes [provider]  Pumpkin Seed-Soy Germ (AZO BLADDER CONTROL/GO-LESS) CAPS Take 1 capsule by mouth daily.    Yes [provider]  rosuvastatin (CRESTOR) 10 MG tablet Take 10 mg by mouth daily.  01/06/11  Yes [provider]  sodium zirconium cyclosilicate (LOKELMA) 10 g PACK packet Take 10 g by mouth See admin instructions. Takes twice weekly on Monday and Thursday   Yes [provider]  Suvorexant (BELSOMRA) 10 MG TABS Take 10 mg by mouth at bedtime.    Yes [provider]  tizanidine (ZANAFLEX) 2 MG capsule Take 2 mg by mouth 2 (two) times daily as needed for muscle spasms.   Yes [provider]  triamcinolone cream (KENALOG) 0.1 % Apply 1 application topically 2 (two) times daily. Mix with Sarna lotion   Yes [provider]  Vibegron (GEMTESA) 75 MG TABS Take 75 mg by mouth daily.   Yes [provider]  diphenhydrAMINE (BENADRYL) 25 MG tablet Take 1 tablet (25 mg total) by mouth every 8 (eight) hours as needed for itching. Patient not taking: Reported on 03/11/2020 12/25/19   Deliah Boston, PA-C  HYDROcodone-acetaminophen [facility]) 10-325 MG tablet Take 1 tablet by  mouth every 6 (six) hours as needed for moderate pain or severe pain. Patient not taking: Reported on 10/10/2019 08/10/18   Domenic Polite, MD    Physical Exam: Vitals:   03/11/20 1436 03/11/20 1600 03/11/20 1800 03/11/20 1958  BP:  (!) 145/69 (!) 171/64 (!) 159/77  Pulse:  64  70  Resp:  13 14 17   Temp:      TempSrc:      SpO2:  97%  98%  Weight: 68 kg     Height: 5\' 2"  (1.575 m)       Constitutional: NAD, comfortable, can appear does not feel well, alert and oriented appearing but somnolent Eyes: pupils equal and reactive to light, anicteric, without injection ENMT: MMM, throat without exudates or erythema Neck: normal, supple, no masses, no thyromegaly noted Respiratory: CTAB, nwob, no cough noted Cardiovascular: rrr w/o mrg, warm extremities Abdomen: No borborygmi, stomach is mildly distended but still somewhat soft, she has generalized pain that she thinks is worse on the right side of her abdomen, but negative Murphy sign Musculoskeletal: moving all 4 extremities, but appears generally weak Skin: no rashes, lesions, ulcers. No induration Neurologic: CN 2-12 grossly intact. Sensation intact Psychiatric: AO appearing, mentation appropriate  Labs on Admission: I have personally reviewed following labs and imaging  studies  CBC: Recent Labs  Lab 03/11/20 1526  WBC 6.6  NEUTROABS 3.2  HGB 12.2  HCT 41.1  MCV 99.3  PLT 992   Basic Metabolic Panel: Recent Labs  Lab 03/11/20 1526  NA 142  K 5.5*  CL 113*  CO2 18*  GLUCOSE 102*  BUN 42*  CREATININE 3.63*  CALCIUM 9.2   GFR: Estimated Creatinine Clearance: 9.5 mL/min (A) (by C-G formula based on SCr of 3.63 mg/dL (H)). Liver Function Tests: Recent Labs  Lab 03/11/20 1526  AST 27  ALT 12  ALKPHOS 78  BILITOT 0.5  PROT 7.5  ALBUMIN 3.8   Recent Labs  Lab 03/11/20 1526  LIPASE 41   No results for input(s): AMMONIA in the last 168 hours. Coagulation Profile: Recent Labs  Lab 03/11/20 1920  INR 1.1   Cardiac Enzymes: No results for input(s): CKTOTAL, CKMB, CKMBINDEX, TROPONINI in the last 168 hours. BNP (last 3 results) No results for input(s): PROBNP in the last 8760 hours. HbA1C: No results for input(s): HGBA1C in the last 72 hours. CBG: No results for input(s): GLUCAP in the last 168 hours. Lipid Profile: No results for input(s): CHOL, HDL, LDLCALC, TRIG, CHOLHDL, LDLDIRECT in the last 72 hours. Thyroid Function Tests: No results for input(s): TSH, T4TOTAL, FREET4, T3FREE, THYROIDAB in the last 72 hours. Anemia Panel: No results for input(s): VITAMINB12, FOLATE, FERRITIN, TIBC, IRON, RETICCTPCT in the last 72 hours. Urine analysis:    Component Value Date/Time   COLORURINE YELLOW 03/11/2020 1443   APPEARANCEUR TURBID (A) 03/11/2020 1443   LABSPEC 1.011 03/11/2020 1443   PHURINE 5.0 03/11/2020 1443   GLUCOSEU NEGATIVE 03/11/2020 1443   HGBUR SMALL (A) 03/11/2020 1443   BILIRUBINUR NEGATIVE 03/11/2020 1443   KETONESUR NEGATIVE 03/11/2020 1443   PROTEINUR 100 (A) 03/11/2020 1443   UROBILINOGEN 0.2 06/11/2013 2102   NITRITE POSITIVE (A) 03/11/2020 1443   LEUKOCYTESUR LARGE (A) 03/11/2020 1443    Radiological Exams on Admission: CT ABDOMEN PELVIS WO CONTRAST  Result Date: 03/11/2020 CLINICAL DATA:  Recent  diagnosis of UTI. EXAM: CT ABDOMEN AND PELVIS WITHOUT CONTRAST TECHNIQUE: Multidetector CT imaging of the abdomen and pelvis was performed following the standard protocol  without IV contrast. COMPARISON:  01/19/2020 FINDINGS: Lower chest: There is a stable small pulmonary nodule in the lingula. There is atelectasis at the left lung base.The heart size is normal. Hepatobiliary: The liver is normal. The gallbladder is distended.There is no biliary ductal dilation. Pancreas: Normal contours without ductal dilatation. No peripancreatic fluid collection. Spleen: Unremarkable. Adrenals/Urinary Tract: --Adrenal glands: Unremarkable. --Right kidney/ureter: The patient is status post right-sided nephrectomy. --Left kidney/ureter: There is moderate left-sided hydroureteronephrosis to the level of the patient's urinary bladder. --Urinary bladder: The urinary bladder is significantly distended. Multiple bladder diverticula are noted. There is a significant amount of layering debris within the dependent portion of the urinary bladder. There is bladder wall thickening with adjacent fat stranding. Stomach/Bowel: --Stomach/Duodenum: No hiatal hernia or other gastric abnormality. Normal duodenal course and caliber. --Small bowel: Unremarkable. --Colon: Rectosigmoid diverticulosis without acute inflammation. --Appendix: Not visualized. No right lower quadrant inflammation or free fluid. Vascular/Lymphatic: Atherosclerotic calcification is present within the non-aneurysmal abdominal aorta, without hemodynamically significant stenosis. --No retroperitoneal lymphadenopathy. --No mesenteric lymphadenopathy. --No pelvic or inguinal lymphadenopathy. Reproductive: The patient is status post prior hysterectomy. There is a 3.2 cm cystic mass involving the left ovary. This is minimally changed since 2018, consistent with a benign process. Other: No ascites or free air. The abdominal wall is normal. Musculoskeletal. There are advanced  degenerative changes throughout the lumbar spine. The patient is status post prior vertebral augmentation of the L1 vertebral body. IMPRESSION: 1. Moderate left-sided hydroureteronephrosis to the level of the patient's urinary bladder. 2. Distended urinary bladder with multiple bladder diverticula. There is diffuse bladder wall thickening with adjacent fat stranding concerning for cystitis. 3. Debris is noted within the dependent portion of the urinary bladder. Correlation with urinalysis is recommended. 4. Status post right-sided nephrectomy. 5. Rectosigmoid diverticulosis without acute inflammation. Aortic Atherosclerosis (ICD10-I70.0). Electronically Signed   By: Constance Holster M.D.   On: 03/11/2020 20:12    EKG: Independently reviewed.  EKG shows normal sinus rhythm with T wave flattening in V2 but question lead placement, no Q waves and no significant ischemic changes  Assessment/Plan Active Problems:   Essential hypertension, benign   Seizure disorder (HCC)   CKD (chronic kidney disease), stage IV (HCC)   S/p nephrectomy   Bladder cancer (HCC)   Recurrent UTI  Admitting her for a recurrent urinary tract infection and intractable abdominal pain and poor p.o. intake resulting in an AKI and not able to keep p.o. down.  To observation.  Generalized abdominal pain **After reviewing the CT scan briefly-I do wonder if this was all from urinary retention [id number] only a gastroenteritis but given the patient states that her hernia is firmer than it usually is and with so much pain I decided to get a CT scan, also to see if she has any signs of kidney stones or other reason for her abdominal pain.  Murphy sign was negative but wanted to look at the right upper quadrant as well -I kept her n.p.o. but went ahead and added heparin prophylaxis  -possible pain out of proportion-patient is not sure if food makes pain worse but wanted to get a lactic acid to reassure ourselves against an ischemic  bowel -There is moderate left-sided hydroureteronephrosis to the level of the patient's urinary bladder on the CT scan -I message the nurse again to please place the Foley, after placing the Foley watch out for postobstructive diuresis  AKI-to 3.63, I asked the nursing staff to place a Foley but the patient was urinating  so we decided against but may be to Foley is a good idea to ensure patency.  I asked the nursing staff to do in and out and I was not sure of the results Hyperkalemia-EKG was okay without peaked T waves, with her creatinine being elevated probably 1 I put her on a renal diet, suspect will improve with fluids. No need for insulin or calcium  Patient needs to be med recced to start urinary meds and other chronic meds when indicated  Patient and/or Family completely agreed with the plan, expressed understanding and I answered all questions.  DVT prophylaxis: hyeparin  Code Status: DNR after discussing with the patient for some time she said she wants to be DNR as she is 79 Family Communication: n/a Disposition Plan: likely back to her facility Consults called: n/a Admission status: Admitting her for a recurrent urinary tract infection and intractable abdominal pain and poor p.o. intake resulting in an AKI and not able to keep p.o. down.  To observation.  A total of 72 minutes utilized during this admission.  [facility]   If 7PM-7AM, please contact night-coverage [email or web address] Password [facility]  03/11/2020, 8:49 PM

## 2020-03-11 NOTE — Plan of Care (Signed)
I think this lady has urinary obstruction and bet will improve drastically with a foley, I am also getting a CT stat because I Am a little concerned she has somewhat of an acute abdomen or incarcerated hernia on her right side.

## 2020-03-11 NOTE — Progress Notes (Signed)
A consult was received from an ED physician for meropenem per pharmacy dosing.  The patient's profile has been reviewed for ht/wt/allergies/indication/available labs.     A one time order has been placed for meropenem 1 gm.  Further antibiotics/pharmacy consults should be ordered by admitting physician if indicated.                       Thank you,  Eudelia Bunch, Pharm.D 03/11/2020 4:42 PM

## 2020-03-11 NOTE — H&P (Signed)
Only one dose of meropenem was allowed, so have to contact pharmacist for more or decide on other.

## 2020-03-11 NOTE — ED Triage Notes (Signed)
Per EMS pt was dx with UTI 2 days ago and rx'd with med that she is allergic to. Pt was never given med or rx'd med that is suitable for symptoms

## 2020-03-11 NOTE — ED Provider Notes (Signed)
[facility] DEPT Provider Note   CSN: [id number] Arrival date & time: 03/11/20  1410     History Chief Complaint  Patient presents with  . Dysuria    BRENAE LASECKI is a 86 y.o. female.  HPI Patient presents via EMS from her facility with staff concerns of urinary tract infection, abdominal pain. The patient complains of abdominal pain and dysuria.  She does have very poor hearing, which complicates history provision somewhat, but beyond the patient, notes, EMS, history is obtained. Seemingly the patient was diagnosed with urinary tract infection 2 or 3 days ago. She was prescribed antibiotics, but upon discovering that she has allergy to this, she has not taken that or any others. Today she presents with ongoing dysuria, abdominal pain no fever, no report of vomiting or diarrhea, patient does not seem in complain of pain anywhere else.     Past Medical History:  Diagnosis Date  . Abnormal gait   . Asthenia   . Back pain   . Bladder cancer ([place])   . CAD (coronary artery disease)   . Chronic pain   . CKD (chronic kidney disease), stage IV ([place])   . Dizziness   . Dysphagia   . 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 Essential hypertension, benign   . Hypokalemia   . Insomnia   . Iron deficiency   . Macular degeneration   . Mixed hyperlipidemia   . Obesity   . Postsurgical aortocoronary bypass status     Patient Active Problem List   Diagnosis Date Noted  . Exudative age-related macular degeneration of right eye with inactive choroidal neovascularization ([place]) 12/20/2019  . Degenerative retinal drusen of both eyes 12/20/2019  . Exudative age-related macular degeneration of left eye with inactive choroidal neovascularization ([place]) 12/20/2019  . Traumatic subdural hematoma ([place]) 08/08/2018  . Pyelonephritis 09/09/2016  . Falls frequently 06/24/2016  . Tension headache 11/10/2013  . Chest pain 11/03/2011  . ARF (acute renal failure) ([place])  04/30/2011  . UTI (urinary tract infection) 04/30/2011  . CKD (chronic kidney disease), stage IV ([place]) 04/30/2011  . Anemia 04/30/2011  . S/p nephrectomy 04/30/2011  . Macular degeneration 04/30/2011  . Cataract 04/30/2011  . Bladder cancer ([place]) 04/30/2011  . Weakness generalized 04/29/2011  . Seizure disorder ([place]) 04/29/2011  . Benign hypertension 04/29/2011  . CAD (coronary artery disease) 04/29/2011  . Hyperlipidemia 04/29/2011  . Anxiety 04/29/2011  . Dizziness - light-headed 04/29/2011  . Chronic abdominal pain 04/29/2011  . Mixed hyperlipidemia 05/24/2008  . Essential hypertension, benign 05/24/2008  . Postsurgical aortocoronary bypass status 05/24/2008    Past Surgical History:  Procedure Laterality Date  . CATARACT EXTRACTION    . CORONARY ARTERY BYPASS GRAFT    . IRIDECTOMY    . NEPHRECTOMY  2000     OB History    Gravida  3   Para  3   Term  3   Preterm      AB      Living  3     SAB      TAB      Ectopic      Multiple      Live Births              Family History  Problem Relation Age of Onset  . Colon cancer Mother   . Heart attack Father   . Diabetes Father   . Stroke Other   . Coronary artery disease Other   .  Cancer Other   . Diabetes Other     Social History   Tobacco Use  . Smoking status: Never Smoker  . Smokeless tobacco: Never Used  Vaping Use  . Vaping Use: Never used  Substance Use Topics  . Alcohol use: No  . Drug use: No    Home Medications Prior to Admission medications   Medication Sig Start Date End Date Taking? Authorizing Provider  acetaminophen (TYLENOL) 325 MG tablet Take 650 mg by mouth every 8 (eight) hours. With hydrocodone   Yes [provider]  amLODipine (NORVASC) 2.5 MG tablet Take 2.5 mg by mouth daily. 10/29/17  Yes [provider]  aspirin EC 81 MG tablet Take 81 mg by mouth daily. Swallow whole.   Yes [provider]  clonazePAM (KLONOPIN) 0.5 MG tablet Take 1  tablet (0.5 mg total) by mouth at bedtime. Patient taking differently: Take 0.5 mg by mouth 2 (two) times daily.  08/10/18  Yes Domenic Polite, MD  diphenhydrAMINE [facility]) 25 mg capsule Take 25 mg by mouth every 8 (eight) hours as needed for itching.   Yes [provider]  gabapentin (NEURONTIN) 300 MG capsule Take 1 capsule (300 mg total) by mouth at bedtime. 08/10/18  Yes Domenic Polite, MD  HYDROcodone-acetaminophen (NORCO/VICODIN) 5-325 MG tablet Take 1 tablet by mouth every 8 (eight) hours.    Yes [provider]  levETIRAcetam (KEPPRA) 250 MG tablet Take 250 mg by mouth 2 (two) times daily.    Yes [provider]  linaclotide (LINZESS) 290 MCG CAPS capsule Take 290 mcg by mouth daily.   Yes [provider]  mirabegron ER (MYRBETRIQ) 25 MG TB24 tablet Take 25 mg by mouth daily.   Yes [provider]  Multiple Vitamins-Minerals (PRESERVISION AREDS PO) Take 1 capsule by mouth daily.   Yes [provider]  NONFORMULARY OR COMPOUNDED ITEM Apply 1 application topically in the morning and at bedtime. Anti -itch lotion 0.5-0.5% ( Mix with triamcinolone)   Yes [provider]  polyethylene glycol (MIRALAX / GLYCOLAX) packet Take 17 g by mouth every other day. As needed for constipation.   Yes [provider]  Pumpkin Seed-Soy Germ (AZO BLADDER CONTROL/GO-LESS) CAPS Take 1 capsule by mouth daily.    Yes [provider]  rosuvastatin (CRESTOR) 10 MG tablet Take 10 mg by mouth daily.  01/06/11  Yes [provider]  sodium zirconium cyclosilicate (LOKELMA) 10 g PACK packet Take 10 g by mouth See admin instructions. Takes twice weekly on Monday and Thursday   Yes [provider]  Suvorexant (BELSOMRA) 10 MG TABS Take 10 mg by mouth at bedtime.   Yes [provider]  tizanidine (ZANAFLEX) 2 MG capsule Take 2 mg by mouth 2 (two) times daily as needed for muscle spasms.   Yes [provider]   triamcinolone cream (KENALOG) 0.1 % Apply 1 application topically 2 (two) times daily. Mix with Sarna lotion   Yes [provider]  Vibegron (GEMTESA) 75 MG TABS Take 75 mg by mouth daily.   Yes [provider]  diphenhydrAMINE (BENADRYL) 25 MG tablet Take 1 tablet (25 mg total) by mouth every 8 (eight) hours as needed for itching. Patient not taking: Reported on 03/11/2020 12/25/19   Deliah Boston, PA-C  HYDROcodone-acetaminophen [facility]) 10-325 MG tablet Take 1 tablet by mouth every 6 (six) hours as needed for moderate pain or severe pain. Patient not taking: Reported on 10/10/2019 08/10/18   Domenic Polite, MD  Allergies    Adhesive [tape], Keflet [cephalexin], Latex, Penicillins, and Sulfa antibiotics  Review of Systems   Review of Systems  Constitutional:       Per HPI, otherwise negative  HENT:       Per HPI, otherwise negative  Respiratory:       Per HPI, otherwise negative  Cardiovascular:       Per HPI, otherwise negative  Gastrointestinal: Positive for abdominal pain. Negative for vomiting.  Endocrine:       Negative aside from HPI  Genitourinary:       Neg aside from HPI   Musculoskeletal:       Per HPI, otherwise negative  Skin: Negative.   Neurological: Negative for syncope.    Physical Exam Updated Vital Signs BP (!) 145/69   Pulse 64   Temp 97.9 F (36.6 C) (Oral)   Resp 13   Ht 5\' 2"  (1.575 m)   Wt 68 kg   SpO2 97%   BMI 27.44 kg/m   Physical Exam Vitals and nursing note reviewed.  Constitutional:      General: She is not in acute distress.    Appearance: She is well-developed.     Comments: Deconditioned appearing elderly female in no distress  HENT:     Head: Normocephalic and atraumatic.  Eyes:     Conjunctiva/sclera: Conjunctivae normal.  Cardiovascular:     Rate and Rhythm: Normal rate and regular rhythm.  Pulmonary:     Effort: Pulmonary effort is normal. No respiratory distress.     Breath sounds: Normal breath  sounds. No stridor.  Abdominal:     General: There is no distension.     Tenderness: There is abdominal tenderness.    Skin:    General: Skin is warm and dry.  Neurological:     Mental Status: She is alert and oriented to person, place, and time.     Cranial Nerves: No cranial nerve deficit.      ED Results / Procedures / Treatments   Labs (all labs ordered are listed, but only abnormal results are displayed) Labs Reviewed  COMPREHENSIVE METABOLIC PANEL - Abnormal; Notable for the following components:      Result Value   Potassium 5.5 (*)    Chloride 113 (*)    CO2 18 (*)    Glucose, Bld 102 (*)    BUN 42 (*)    Creatinine, Ser 3.63 (*)    GFR, Estimated 11 (*)    All other components within normal limits  CBC WITH DIFFERENTIAL/PLATELET - Abnormal; Notable for the following components:   MCHC 29.7 (*)    All other components within normal limits  URINALYSIS, ROUTINE W REFLEX MICROSCOPIC - Abnormal; Notable for the following components:   APPearance TURBID (*)    Hgb urine dipstick SMALL (*)    Protein, ur 100 (*)    Nitrite POSITIVE (*)    Leukocytes,Ua LARGE (*)    WBC, UA >50 (*)    Bacteria, UA MANY (*)    All other components within normal limits  RESP PANEL BY RT-PCR (FLU A&B, COVID) [id number]  LIPASE, BLOOD    EKG EKG Interpretation  Date/Time:  Sunday March 11 2020 17:11:50 EST Ventricular Rate:  66 PR Interval:    QRS Duration: 87 QT Interval:  466 QTC Calculation: 489 R Axis:   26 Text Interpretation: Sinus rhythm Borderline prolonged PR interval Low voltage, precordial leads Nonspecific T abnormalities, lateral leads Borderline prolonged QT interval Abnormal ECG  Confirmed by Carmin Muskrat [id number]) on 03/11/2020 5:18:19 PM   Procedures Procedures (including critical care time)  Medications Ordered in ED Medications  sodium chloride 0.9 % bolus 1,000 mL (has no administration in time range)  meropenem (MERREM) 1 g in sodium chloride 0.9 % 100  mL IVPB (has no administration in time range)    ED Course  I have reviewed the triage vital signs and the nursing notes.  Pertinent labs & imaging results that were available during my care of the patient were reviewed by me and considered in my medical decision making (see chart for details). Chart review after initial visit notable for evaluation about 1 month ago for similar consideration but CT scan at that point was reassuring. Urine culture from that point notable for susceptibility only to IV antibiotics.  4:59 PM  Patient calm, hemodynamically unremarkable.  She is aware of today's findings, including evidence for urinary tract infection with acute kidney injury. Given this combination as well as hyperkalemia she will require ongoing fluid resuscitation and IV antibiotics, as above. Have discussed this need with our pharmacy team, patient will start meropenem.     MDM Rules/Calculators/A&P MDM Number of Diagnoses or Management Options AKI (acute kidney injury) ([place]): new, needed workup Hyperkalemia: new, needed workup Lower urinary tract infectious disease: new, needed workup   Amount and/or Complexity of Data Reviewed Clinical lab tests: reviewed Tests in the medicine section of CPT: reviewed Decide to obtain previous medical records or to obtain history from someone other than the patient: yes Obtain history from someone other than the patient: yes Review and summarize past medical records: yes Discuss the patient with other providers: yes  Risk of Complications, Morbidity, and/or Mortality Presenting problems: high Diagnostic procedures: high Management options: high  Critical Care Total time providing critical care: < 30 minutes  Patient Progress Patient progress: stable  Final Clinical Impression(s) / ED Diagnoses Final diagnoses:  Lower urinary tract infectious disease  AKI (acute kidney injury) ([place])  Hyperkalemia     Carmin Muskrat, MD 03/11/20  1718

## 2020-03-12 ENCOUNTER — Encounter (HOSPITAL_COMMUNITY): Payer: Self-pay | Admitting: Internal Medicine

## 2020-03-12 DIAGNOSIS — Z888 Allergy status to other drugs, medicaments and biological substances status: Secondary | ICD-10-CM | POA: Diagnosis not present

## 2020-03-12 DIAGNOSIS — B9620 Unspecified Escherichia coli [E. coli] as the cause of diseases classified elsewhere: Secondary | ICD-10-CM | POA: Diagnosis present

## 2020-03-12 DIAGNOSIS — N184 Chronic kidney disease, stage 4 (severe): Secondary | ICD-10-CM | POA: Diagnosis present

## 2020-03-12 DIAGNOSIS — I129 Hypertensive chronic kidney disease with stage 1 through stage 4 chronic kidney disease, or unspecified chronic kidney disease: Secondary | ICD-10-CM | POA: Diagnosis present

## 2020-03-12 DIAGNOSIS — N179 Acute kidney failure, unspecified: Secondary | ICD-10-CM | POA: Diagnosis present

## 2020-03-12 DIAGNOSIS — N1330 Unspecified hydronephrosis: Secondary | ICD-10-CM | POA: Diagnosis present

## 2020-03-12 DIAGNOSIS — Z905 Acquired absence of kidney: Secondary | ICD-10-CM | POA: Diagnosis not present

## 2020-03-12 DIAGNOSIS — Z20822 Contact with and (suspected) exposure to covid-19: Secondary | ICD-10-CM | POA: Diagnosis present

## 2020-03-12 DIAGNOSIS — Z951 Presence of aortocoronary bypass graft: Secondary | ICD-10-CM | POA: Diagnosis not present

## 2020-03-12 DIAGNOSIS — N323 Diverticulum of bladder: Secondary | ICD-10-CM | POA: Diagnosis present

## 2020-03-12 DIAGNOSIS — N3020 Other chronic cystitis without hematuria: Secondary | ICD-10-CM | POA: Diagnosis present

## 2020-03-12 DIAGNOSIS — G4700 Insomnia, unspecified: Secondary | ICD-10-CM | POA: Diagnosis present

## 2020-03-12 DIAGNOSIS — G40909 Epilepsy, unspecified, not intractable, without status epilepticus: Secondary | ICD-10-CM | POA: Diagnosis present

## 2020-03-12 DIAGNOSIS — Z880 Allergy status to penicillin: Secondary | ICD-10-CM | POA: Diagnosis not present

## 2020-03-12 DIAGNOSIS — Z66 Do not resuscitate: Secondary | ICD-10-CM | POA: Diagnosis present

## 2020-03-12 DIAGNOSIS — G8929 Other chronic pain: Secondary | ICD-10-CM | POA: Diagnosis present

## 2020-03-12 DIAGNOSIS — I2510 Atherosclerotic heart disease of native coronary artery without angina pectoris: Secondary | ICD-10-CM | POA: Diagnosis present

## 2020-03-12 DIAGNOSIS — Z882 Allergy status to sulfonamides status: Secondary | ICD-10-CM | POA: Diagnosis not present

## 2020-03-12 DIAGNOSIS — Z833 Family history of diabetes mellitus: Secondary | ICD-10-CM | POA: Diagnosis not present

## 2020-03-12 DIAGNOSIS — E875 Hyperkalemia: Secondary | ICD-10-CM | POA: Diagnosis present

## 2020-03-12 DIAGNOSIS — Z8249 Family history of ischemic heart disease and other diseases of the circulatory system: Secondary | ICD-10-CM | POA: Diagnosis not present

## 2020-03-12 DIAGNOSIS — Z8551 Personal history of malignant neoplasm of bladder: Secondary | ICD-10-CM | POA: Diagnosis not present

## 2020-03-12 DIAGNOSIS — E782 Mixed hyperlipidemia: Secondary | ICD-10-CM | POA: Diagnosis present

## 2020-03-12 DIAGNOSIS — N390 Urinary tract infection, site not specified: Secondary | ICD-10-CM | POA: Diagnosis not present

## 2020-03-12 DIAGNOSIS — Z800 Family history of malignant neoplasm of digestive organs: Secondary | ICD-10-CM | POA: Diagnosis not present

## 2020-03-12 LAB — COMPREHENSIVE METABOLIC PANEL
ALT: 12 U/L (ref 0–44)
AST: 20 U/L (ref 15–41)
Albumin: 3.3 g/dL — ABNORMAL LOW (ref 3.5–5.0)
Alkaline Phosphatase: 83 U/L (ref 38–126)
Anion gap: 12 (ref 5–15)
BUN: 37 mg/dL — ABNORMAL HIGH (ref 8–23)
CO2: 19 mmol/L — ABNORMAL LOW (ref 22–32)
Calcium: 8.9 mg/dL (ref 8.9–10.3)
Chloride: 112 mmol/L — ABNORMAL HIGH (ref 98–111)
Creatinine, Ser: 3.27 mg/dL — ABNORMAL HIGH (ref 0.44–1.00)
GFR, Estimated: 13 mL/min — ABNORMAL LOW (ref >60)
Glucose, Bld: 113 mg/dL — ABNORMAL HIGH (ref 70–99)
Potassium: 4.8 mmol/L (ref 3.5–5.1)
Sodium: 143 mmol/L (ref 135–145)
Total Bilirubin: 0.7 mg/dL (ref 0.3–1.2)
Total Protein: 7 g/dL (ref 6.5–8.1)

## 2020-03-12 LAB — CBC
HCT: 39.2 % (ref 36.0–46.0)
Hemoglobin: 12.2 g/dL (ref 12.0–15.0)
MCH: 29.1 pg (ref 26.0–34.0)
MCHC: 31.1 g/dL (ref 30.0–36.0)
MCV: 93.6 fL (ref 80.0–100.0)
Platelets: 198 10*3/uL (ref 150–400)
RBC: 4.19 MIL/uL (ref 3.87–5.11)
RDW: 14.6 % (ref 11.5–15.5)
WBC: 7.4 10*3/uL (ref 4.0–10.5)
nRBC: 0 % (ref 0.0–0.2)

## 2020-03-12 LAB — HEMOGLOBIN A1C
Hgb A1c MFr Bld: 6.2 % — ABNORMAL HIGH (ref 4.8–5.6)
Mean Plasma Glucose: 131.24 mg/dL

## 2020-03-12 MED ORDER — SODIUM CHLORIDE 0.9 % IV SOLN
500.0000 mg | Freq: Two times a day (BID) | INTRAVENOUS | Status: DC
  Administered 2020-03-12 – 2020-03-13 (×3): 500 mg via INTRAVENOUS
  Filled 2020-03-12 (×3): qty 500
  Filled 2020-03-12: qty 0.5

## 2020-03-12 MED ORDER — CHLORHEXIDINE GLUCONATE CLOTH 2 % EX PADS: 6.0000 | MEDICATED_PAD | Freq: Every day | CUTANEOUS | Status: DC

## 2020-03-12 MED ORDER — AMLODIPINE BESYLATE 10 MG PO TABS
10.0000 mg | ORAL_TABLET | Freq: Every day | ORAL | Status: DC
  Administered 2020-03-13: 09:00:00 10 mg via ORAL
  Filled 2020-03-12: qty 1

## 2020-03-12 NOTE — Consult Note (Signed)
Subjective:   CC: Suprapubic pain.  Hx: [name] [name] is an 86 yo female who I was asked to see by Dr. Tawanna Solo for left hydro and UTI's.  [name] [name] has a history of urothelial CA remotely with a right nephrectomy and multiple bladder resection that left her with a very large diverticulum of the proximal urethra and bladder neck.  She has a history of poor bladder emptying and has had a foley intermittently in the past but doesn't tolerate it well.   She has chronic progressive left hydronephrosis that is down to the bladder wall and is most consistent with dilation for the chronic retention.   She has urinary incontinence that is severe and has failed to respond well to medical therapy and she has chronic cystitis and was recent seen in our office and found to have e. Coli that was either resistant to the available oral agents or she had an allergy to them.  She was sent a script for fosfomycin but it was too costly.  I had planned to have her seen by ID for parenteral management.  She is currently on Meropenem which is appropriate for her bacteria.  She has a history of CKD which has been progressive and is now stage IV.     ROS:  Review of Systems  Constitutional: Negative for chills and fever.  Gastrointestinal: Positive for abdominal pain.  Genitourinary: Positive for dysuria.  All other systems reviewed and are negative.   Allergies  Allergen Reactions  . Adhesive [Tape] Other (See Comments)    Tears patients skin off.   [name] [name] [Cephalexin] Nausea And Vomiting  . Latex Itching  . Penicillins Hives and Itching  . Sulfa Antibiotics Hives and Itching    Past Medical History:  Diagnosis Date  . Abnormal gait   . Asthenia   . Back pain   . Bladder cancer ([name] [name])   . CAD (coronary artery disease)   . Chronic pain   . CKD (chronic kidney disease), stage IV ([name] [name])   . Dizziness   . Dysphagia   . 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 Essential hypertension, benign   . Hypokalemia   . Insomnia    . Iron deficiency   . Macular degeneration   . Mixed hyperlipidemia   . Obesity   . Postsurgical aortocoronary bypass status     Past Surgical History:  Procedure Laterality Date  . CATARACT EXTRACTION    . CORONARY ARTERY BYPASS GRAFT    . IRIDECTOMY    . NEPHRECTOMY  2000    Social History   Socioeconomic History  . Marital status: Widowed    Spouse name: Not on file  . Number of children: 3  . Years of education: Not on file  . Highest education level: Not on file  Occupational History    Comment: n/a  Tobacco Use  . Smoking status: Never Smoker  . Smokeless tobacco: Never Used  Vaping Use  . Vaping Use: Never used  Substance and Sexual Activity  . Alcohol use: No  . Drug use: No  . Sexual activity: Never    Birth control/protection: None  Other Topics Concern  . Not on file  Social History Narrative   Patient lives at home with his son and daughter in law.   Caffeine Use: 2 cups of caffeine daily.    Social Determinants of Health   Financial Resource Strain:   . Difficulty of Paying Living Expenses: Not on file  Food Insecurity:   .  Worried About Charity fundraiser in the Last Year: Not on file  . Ran Out of Food in the Last Year: Not on file  Transportation Needs:   . Lack of Transportation (Medical): Not on file  . Lack of Transportation (Non-Medical): Not on file  Physical Activity:   . Days of Exercise per Week: Not on file  . Minutes of Exercise per Session: Not on file  Stress:   . Feeling of Stress : Not on file  Social Connections:   . Frequency of Communication with Friends and Family: Not on file  . Frequency of Social Gatherings with Friends and Family: Not on file  . Attends Religious Services: Not on file  . Active Member of Clubs or Organizations: Not on file  . Attends Archivist Meetings: Not on file  . Marital Status: Not on file  Intimate Partner Violence:   . Fear of Current or Ex-Partner: Not on file  . Emotionally  Abused: Not on file  . Physically Abused: Not on file  . Sexually Abused: Not on file    Family History  Problem Relation Age of Onset  . Colon cancer Mother   . Heart attack Father   . Diabetes Father   . Stroke Other   . Coronary artery disease Other   . Cancer Other   . Diabetes Other     Anti-infectives: Anti-infectives (From admission, onward)   Start     Dose/Rate Route Frequency Ordered Stop   03/12/20 1000  meropenem (MERREM) 500 mg in sodium chloride 0.9 % 100 mL IVPB        500 mg 200 mL/hr over 30 Minutes Intravenous Every 12 hours 03/12/20 0857     03/11/20 1645  meropenem (MERREM) 1 g in sodium chloride 0.9 % 100 mL IVPB        1 g 200 mL/hr over 30 Minutes Intravenous  Once 03/11/20 1640 03/11/20 1818      Current Facility-Administered Medications  Medication Dose Route Frequency Provider Last Rate Last Admin  . acetaminophen (TYLENOL) tablet 650 mg  650 mg Oral Q6H PRN Sueanne Margarita, DO       Or  . acetaminophen (TYLENOL) suppository 650 mg  650 mg Rectal Q6H PRN Sueanne Margarita, DO      . amLODipine (NORVASC) tablet 2.5 mg  2.5 mg Oral Daily Sueanne Margarita, DO   2.5 mg at 03/12/20 1053  . aspirin EC tablet 81 mg  81 mg Oral Daily Sueanne Margarita, DO   81 mg at 03/12/20 1053  . clonazePAM (KLONOPIN) disintegrating tablet 0.25 mg  0.25 mg Oral BID PRN Sueanne Margarita, DO      . levETIRAcetam (KEPPRA) tablet 250 mg  250 mg Oral BID Sueanne Margarita, DO   250 mg at 03/12/20 1109  . meropenem (MERREM) 500 mg in sodium chloride 0.9 % 100 mL IVPB  500 mg Intravenous Q12H Emiliano Dyer, RPH 200 mL/hr at 03/12/20 1053 500 mg at 03/12/20 1053  . oxyCODONE (Oxy IR/ROXICODONE) immediate release tablet 2.5-5 mg  2.5-5 mg Oral Q6H PRN Sueanne Margarita, DO   5 mg at 03/11/20 2019  . rosuvastatin (CRESTOR) tablet 10 mg  10 mg Oral Daily Sueanne Margarita, DO   10 mg at 03/12/20 1053     Objective: Vital signs in last 24 hours: BP (!) 163/70 (BP Location: Left Arm)   Pulse 76    Temp 98.5 F (36.9 C) (Oral)   Resp 16   Ht  5\' 2"  (1.575 m)   Wt 68 kg   SpO2 98%   BMI 27.44 kg/m   Intake/Output from previous day: 12/05 0701 - 12/06 0700 In: -  Out: 700 [Urine:700] Intake/Output this shift: No intake/output data recorded.   Physical Exam Vitals reviewed.  Constitutional:      Appearance: Normal appearance.  Neurological:     Mental Status: She is alert.     Lab Results:  Results for orders placed or performed during the hospital encounter of 03/11/20 (from the past 24 hour(s))  Urinalysis, Routine w reflex microscopic Urine, Catheterized     Status: Abnormal   Collection Time: 03/11/20  2:43 PM  Result Value Ref Range   Color, Urine YELLOW YELLOW   APPearance TURBID (A) CLEAR   Specific Gravity, Urine 1.011 1.005 - 1.030   pH 5.0 5.0 - 8.0   Glucose, UA NEGATIVE NEGATIVE mg/dL   Hgb urine dipstick SMALL (A) NEGATIVE   Bilirubin Urine NEGATIVE NEGATIVE   Ketones, ur NEGATIVE NEGATIVE mg/dL   Protein, ur 100 (A) NEGATIVE mg/dL   Nitrite POSITIVE (A) NEGATIVE   Leukocytes,Ua LARGE (A) NEGATIVE   RBC / HPF 11-20 0 - 5 RBC/hpf   WBC, UA >50 (H) 0 - 5 WBC/hpf   Bacteria, UA MANY (A) NONE SEEN   Squamous Epithelial / LPF 0-5 0 - 5   WBC Clumps PRESENT   Comprehensive metabolic panel     Status: Abnormal   Collection Time: 03/11/20  3:26 PM  Result Value Ref Range   Sodium 142 135 - 145 mmol/L   Potassium 5.5 (H) 3.5 - 5.1 mmol/L   Chloride 113 (H) 98 - 111 mmol/L   CO2 18 (L) 22 - 32 mmol/L   Glucose, Bld 102 (H) 70 - 99 mg/dL   BUN 42 (H) 8 - 23 mg/dL   Creatinine, Ser 3.63 (H) 0.44 - 1.00 mg/dL   Calcium 9.2 8.9 - 10.3 mg/dL   Total Protein 7.5 6.5 - 8.1 g/dL   Albumin 3.8 3.5 - 5.0 g/dL   AST 27 15 - 41 U/L   ALT 12 0 - 44 U/L   Alkaline Phosphatase 78 38 - 126 U/L   Total Bilirubin 0.5 0.3 - 1.2 mg/dL   GFR, Estimated 11 (L) >60 mL/min   Anion gap 11 5 - 15  Lipase, blood     Status: None   Collection Time: 03/11/20  3:26 PM   Result Value Ref Range   Lipase 41 11 - 51 U/L  CBC with Differential     Status: Abnormal   Collection Time: 03/11/20  3:26 PM  Result Value Ref Range   WBC 6.6 4.0 - 10.5 K/uL   RBC 4.14 3.87 - 5.11 MIL/uL   Hemoglobin 12.2 12.0 - 15.0 g/dL   HCT 41.1 36 - 46 %   MCV 99.3 80.0 - 100.0 fL   MCH 29.5 26.0 - 34.0 pg   MCHC 29.7 (L) 30.0 - 36.0 g/dL   RDW 14.7 11.5 - 15.5 %   Platelets 159 150 - 400 K/uL   nRBC 0.0 0.0 - 0.2 %   Neutrophils Relative % 49 %   Neutro Abs 3.2 1.7 - 7.7 K/uL   Lymphocytes Relative 36 %   Lymphs Abs 2.4 0.7 - 4.0 K/uL   Monocytes Relative 10 %   Monocytes Absolute 0.7 0.1 - 1.0 K/uL   Eosinophils Relative 4 %   Eosinophils Absolute 0.3 0.0 - 0.5 K/uL  Basophils Relative 1 %   Basophils Absolute 0.1 0.0 - 0.1 K/uL   Immature Granulocytes 0 %   Abs Immature Granulocytes 0.02 0.00 - 0.07 K/uL  Resp Panel by RT-PCR (Flu A&B, Covid) Nasopharyngeal Swab     Status: None   Collection Time: 03/11/20  4:24 PM   Specimen: Nasopharyngeal Swab; Nasopharyngeal(NP) swabs in vial transport medium  Result Value Ref Range   SARS Coronavirus 2 by RT PCR NEGATIVE NEGATIVE   Influenza A by PCR NEGATIVE NEGATIVE   Influenza B by PCR NEGATIVE NEGATIVE  Lactic acid, plasma     Status: None   Collection Time: 03/11/20  7:20 PM  Result Value Ref Range   Lactic Acid, Venous 1.3 0.5 - 1.9 mmol/L  Protime-INR     Status: None   Collection Time: 03/11/20  7:20 PM  Result Value Ref Range   Prothrombin Time 13.3 11.4 - 15.2 seconds   INR 1.1 0.8 - 1.2  Hemoglobin A1c     Status: Abnormal   Collection Time: 03/11/20  7:20 PM  Result Value Ref Range   Hgb A1c MFr Bld 6.2 (H) 4.8 - 5.6 %   Mean Plasma Glucose 131.24 mg/dL  Lactic acid, plasma     Status: None   Collection Time: 03/11/20  8:06 PM  Result Value Ref Range   Lactic Acid, Venous 1.4 0.5 - 1.9 mmol/L  Comprehensive metabolic panel     Status: Abnormal   Collection Time: 03/12/20  5:14 AM  Result Value Ref  Range   Sodium 143 135 - 145 mmol/L   Potassium 4.8 3.5 - 5.1 mmol/L   Chloride 112 (H) 98 - 111 mmol/L   CO2 19 (L) 22 - 32 mmol/L   Glucose, Bld 113 (H) 70 - 99 mg/dL   BUN 37 (H) 8 - 23 mg/dL   Creatinine, Ser 3.27 (H) 0.44 - 1.00 mg/dL   Calcium 8.9 8.9 - 10.3 mg/dL   Total Protein 7.0 6.5 - 8.1 g/dL   Albumin 3.3 (L) 3.5 - 5.0 g/dL   AST 20 15 - 41 U/L   ALT 12 0 - 44 U/L   Alkaline Phosphatase 83 38 - 126 U/L   Total Bilirubin 0.7 0.3 - 1.2 mg/dL   GFR, Estimated 13 (L) >60 mL/min   Anion gap 12 5 - 15  CBC     Status: None   Collection Time: 03/12/20  5:14 AM  Result Value Ref Range   WBC 7.4 4.0 - 10.5 K/uL   RBC 4.19 3.87 - 5.11 MIL/uL   Hemoglobin 12.2 12.0 - 15.0 g/dL   HCT 39.2 36 - 46 %   MCV 93.6 80.0 - 100.0 fL   MCH 29.1 26.0 - 34.0 pg   MCHC 31.1 30.0 - 36.0 g/dL   RDW 14.6 11.5 - 15.5 %   Platelets 198 150 - 400 K/uL   nRBC 0.0 0.0 - 0.2 %    BMET Recent Labs    03/11/20 1526 03/12/20 0514  NA 142 143  K 5.5* 4.8  CL 113* 112*  CO2 18* 19*  GLUCOSE 102* 113*  BUN 42* 37*  CREATININE 3.63* 3.27*  CALCIUM 9.2 8.9   PT/INR Recent Labs    03/11/20 1920  LABPROT 13.3  INR 1.1   ABG No results for input(s): PHART, HCO3 in the last 72 hours.  Invalid input(s): PCO2, PO2  Studies/Results: CT ABDOMEN PELVIS WO CONTRAST  Result Date: 03/11/2020 CLINICAL DATA:  Recent diagnosis of  UTI. EXAM: CT ABDOMEN AND PELVIS WITHOUT CONTRAST TECHNIQUE: Multidetector CT imaging of the abdomen and pelvis was performed following the standard protocol without IV contrast. COMPARISON:  01/19/2020 FINDINGS: Lower chest: There is a stable small pulmonary nodule in the lingula. There is atelectasis at the left lung base.The heart size is normal. Hepatobiliary: The liver is normal. The gallbladder is distended.There is no biliary ductal dilation. Pancreas: Normal contours without ductal dilatation. No peripancreatic fluid collection. Spleen: Unremarkable.  Adrenals/Urinary Tract: --Adrenal glands: Unremarkable. --Right kidney/ureter: The patient is status post right-sided nephrectomy. --Left kidney/ureter: There is moderate left-sided hydroureteronephrosis to the level of the patient's urinary bladder. --Urinary bladder: The urinary bladder is significantly distended. Multiple bladder diverticula are noted. There is a significant amount of layering debris within the dependent portion of the urinary bladder. There is bladder wall thickening with adjacent fat stranding. Stomach/Bowel: --Stomach/Duodenum: No hiatal hernia or other gastric abnormality. Normal duodenal course and caliber. --Small bowel: Unremarkable. --Colon: Rectosigmoid diverticulosis without acute inflammation. --Appendix: Not visualized. No right lower quadrant inflammation or free fluid. Vascular/Lymphatic: Atherosclerotic calcification is present within the non-aneurysmal abdominal aorta, without hemodynamically significant stenosis. --No retroperitoneal lymphadenopathy. --No mesenteric lymphadenopathy. --No pelvic or inguinal lymphadenopathy. Reproductive: The patient is status post prior hysterectomy. There is a 3.2 cm cystic mass involving the left ovary. This is minimally changed since 2018, consistent with a benign process. Other: No ascites or free air. The abdominal wall is normal. Musculoskeletal. There are advanced degenerative changes throughout the lumbar spine. The patient is status post prior vertebral augmentation of the L1 vertebral body. IMPRESSION: 1. Moderate left-sided hydroureteronephrosis to the level of the patient's urinary bladder. 2. Distended urinary bladder with multiple bladder diverticula. There is diffuse bladder wall thickening with adjacent fat stranding concerning for cystitis. 3. Debris is noted within the dependent portion of the urinary bladder. Correlation with urinalysis is recommended. 4. Status post right-sided nephrectomy. 5. Rectosigmoid diverticulosis  without acute inflammation. Aortic Atherosclerosis (ICD10-I70.0). Electronically Signed   By: Constance Holster M.D.   On: 03/11/2020 20:12     Assessment/Plan: Chronic cystitis.   Agree with Meropenem and potentially fosfomycin prior to D/C.  Chronic urinary retention with left hydronephrosis and incontinence.   I think chronic foley drainage is most appropriate at this time and we could consider a suprapubic tube if she doesn't tolerate the foley.  I will arrange office f/u in a months.  CKD4.  She has a solitary kidney and left hydro. We can see if foley drainage will improve the CKD. Her Cr is down some since admission.         No follow-ups on file.    CC: Dr. Shelly Coss.      Irine Seal 03/12/2020 [phone]

## 2020-03-12 NOTE — Progress Notes (Signed)
PROGRESS NOTE    [name] [name]  HUT:[id number] DOB: [birth year] DOA: 03/11/2020 PCP: Celene Squibb, MD   Chief Complain: Abdominal pain  Brief Narrative: Patient is 86 year old female with history of seizure disorder, hypertension, CKD status for status post right-sided nephrectomy, bladder cancer who presents to the emergency department with complaints of abdomen pain, nausea, vomiting, decreased oral intake.  Patient has history of frequent UTIs in the past.  She was weak for last 3 to 4 days.  Patient ambulates with the help of wheelchair.  She was recently treated for UTI. on presentation she was hemodynamically stable with blood pressure on the higher range.  Patient was admitted for the management of UTI  Assessment & Plan:   Active Problems:   Essential hypertension, benign   Seizure disorder (HCC)   CKD (chronic kidney disease), stage IV (HCC)   S/p nephrectomy   Bladder cancer (HCC)   Recurrent UTI   Abdominal pain: Presented with lower abdominal discomfort/dysuria Abdominal CT showed moderate left-sided hydroureteronephrosis to the level of the patient's urinary bladder.as per the urology, Dr. Jeffie Pollock, this is nothing new.  CT showed distended urinary bladder with multiple bladder diverticula., diffuse bladder wall thickening with adjacent fat stranding concerning for cystitis.  We have consulted urology.  Foley catheter placed in the emergency department. Abdominal pain has resolved today.  Her abdomen is soft and nontender.  Suspected UTI: Urine culture on 01/19/20 showed ESBL E. coli.  We will continue meropenem.  Plan for 5 days course. imaging showed possibility of cystitis.  Patient does not have any leukocytosis.  Lactate level normal.  If urology thinks that it can be treated with fosfomycin, we will not continue meropenem on discharge.  We will get urine culture.  AKI on SKD stage 4: Baseline creatinine around 2.5-2.9.  Patient with creatinine in the range of 3.  Continue IV  fluids Patient is a status post right nephrectomy because of malignancy.  Hyperkalemia: Resolved  Diarrhea: C. difficile pending.  Currently denies any diarrhea  Hypertension: We will continue amlod 10 mg daily.  Monitor blood pressure.  Hyperlipidemia: Continue Crestor         DVT prophylaxis: Heparin subcu Code Status: Full code Family Communication: Sister on phone today Status is: Observation  The patient remains OBS appropriate and will d/c before 2 midnights.  Dispo: The patient is from: SNF              Anticipated d/c is to: SNF              Anticipated d/c date is: 1 day              Patient currently is not medically stable to d/c.    Consultants: Urology  Procedures: None  Antimicrobials:  Anti-infectives (From admission, onward)   Start     Dose/Rate Route Frequency Ordered Stop   03/11/20 1645  meropenem (MERREM) 1 g in sodium chloride 0.9 % 100 mL IVPB        1 g 200 mL/hr over 30 Minutes Intravenous  Once 03/11/20 1640 03/11/20 1818      Subjective: Patient seen and examined the bedside this morning.  Comfortable during my evaluation.  Denies any abdominal pain today.  Feels better.  Denies any nausea vomiting or diarrhea  Objective: Vitals:   03/12/20 0500 03/12/20 0600 03/12/20 0700 03/12/20 0811  BP: (!) 171/79 (!) 152/66 (!) 126/52 (!) 163/70  Pulse: 79 79 80 76  Resp: 13 15 17  16  Temp:    98.5 F (36.9 C)  TempSrc:    Oral  SpO2: 95% 96% 98% 98%  Weight:      Height:        Intake/Output Summary (Last 24 hours) at 03/12/2020 0827 Last data filed at 03/11/2020 2112 Gross per 24 hour  Intake --  Output 700 ml  Net -700 ml   Filed Weights   03/11/20 1436  Weight: 68 kg    Examination:  General exam: Very elderly, deconditioned, debilitated pleasant female HEENT:PERRL,Oral mucosa moist, Ear/Nose normal on gross exam Respiratory system: Bilateral equal air entry, normal vesicular breath sounds, no wheezes or crackles   Cardiovascular system: S1 & S2 heard, RRR. No JVD, murmurs, rubs, gallops or clicks. No pedal edema. Gastrointestinal system: Abdomen is nondistended, soft and nontender. No organomegaly or masses felt. Normal bowel sounds heard. Central nervous system: Alert and oriented. No focal neurological deficits. Extremities: No edema, no clubbing ,no cyanosis Skin: No rashes, lesions or ulcers,no icterus ,no pallor GU: Foley    Data Reviewed: I have personally reviewed following labs and imaging studies  CBC: Recent Labs  Lab 03/11/20 1526 03/12/20 0514  WBC 6.6 7.4  NEUTROABS 3.2  --   HGB 12.2 12.2  HCT 41.1 39.2  MCV 99.3 93.6  PLT 159 277   Basic Metabolic Panel: Recent Labs  Lab 03/11/20 1526 03/12/20 0514  NA 142 143  K 5.5* 4.8  CL 113* 112*  CO2 18* 19*  GLUCOSE 102* 113*  BUN 42* 37*  CREATININE 3.63* 3.27*  CALCIUM 9.2 8.9   GFR: Estimated Creatinine Clearance: 10.6 mL/min (A) (by C-G formula based on SCr of 3.27 mg/dL (H)). Liver Function Tests: Recent Labs  Lab 03/11/20 1526 03/12/20 0514  AST 27 20  ALT 12 12  ALKPHOS 78 83  BILITOT 0.5 0.7  PROT 7.5 7.0  ALBUMIN 3.8 3.3*   Recent Labs  Lab 03/11/20 1526  LIPASE 41   No results for input(s): AMMONIA in the last 168 hours. Coagulation Profile: Recent Labs  Lab 03/11/20 1920  INR 1.1   Cardiac Enzymes: No results for input(s): CKTOTAL, CKMB, CKMBINDEX, TROPONINI in the last 168 hours. BNP (last 3 results) No results for input(s): PROBNP in the last 8760 hours. HbA1C: Recent Labs    03/11/20 1920  HGBA1C 6.2*   CBG: No results for input(s): GLUCAP in the last 168 hours. Lipid Profile: No results for input(s): CHOL, HDL, LDLCALC, TRIG, CHOLHDL, LDLDIRECT in the last 72 hours. Thyroid Function Tests: No results for input(s): TSH, T4TOTAL, FREET4, T3FREE, THYROIDAB in the last 72 hours. Anemia Panel: No results for input(s): VITAMINB12, FOLATE, FERRITIN, TIBC, IRON, RETICCTPCT in the  last 72 hours. Sepsis Labs: Recent Labs  Lab 03/11/20 1920 03/11/20 2006  LATICACIDVEN 1.3 1.4    Recent Results (from the past 240 hour(s))  Resp Panel by RT-PCR (Flu A&B, Covid) Nasopharyngeal Swab     Status: None   Collection Time: 03/11/20  4:24 PM   Specimen: Nasopharyngeal Swab; Nasopharyngeal(NP) swabs in vial transport medium  Result Value Ref Range Status   SARS Coronavirus 2 by RT PCR NEGATIVE NEGATIVE Final    Comment: (NOTE) SARS-CoV-2 target nucleic acids are NOT DETECTED.  The SARS-CoV-2 RNA is generally detectable in upper respiratory specimens during the acute phase of infection. The lowest concentration of SARS-CoV-2 viral copies this assay can detect is 138 copies/mL. A negative result does not preclude SARS-Cov-2 infection and should not be used as  the sole basis for treatment or other patient management decisions. A negative result may occur with  improper specimen collection/handling, submission of specimen other than nasopharyngeal swab, presence of viral mutation(s) within the areas targeted by this assay, and inadequate number of viral copies(<138 copies/mL). A negative result must be combined with clinical observations, patient history, and epidemiological information. The expected result is Negative.  Fact Sheet for Patients:  EntrepreneurPulse.com.au  Fact Sheet for Healthcare Providers:  IncredibleEmployment.be  This test is no t yet approved or cleared by the [place] FDA and  has been authorized for detection and/or diagnosis of SARS-CoV-2 by FDA under an Emergency Use Authorization (EUA). This EUA will remain  in effect (meaning this test can be used) for the duration of the COVID-19 declaration under Section 564(b)(1) of the Act, 21 U.S.C.section 360bbb-3(b)(1), unless the authorization is terminated  or revoked sooner.       Influenza A by PCR NEGATIVE NEGATIVE Final   Influenza B by PCR NEGATIVE  NEGATIVE Final    Comment: (NOTE) The Xpert Xpress SARS-CoV-2/FLU/RSV plus assay is intended as an aid in the diagnosis of influenza from Nasopharyngeal swab specimens and should not be used as a sole basis for treatment. Nasal washings and aspirates are unacceptable for Xpert Xpress SARS-CoV-2/FLU/RSV testing.  Fact Sheet for Patients: EntrepreneurPulse.com.au  Fact Sheet for Healthcare Providers: IncredibleEmployment.be  This test is not yet approved or cleared by the [place] FDA and has been authorized for detection and/or diagnosis of SARS-CoV-2 by FDA under an Emergency Use Authorization (EUA). This EUA will remain in effect (meaning this test can be used) for the duration of the COVID-19 declaration under Section 564(b)(1) of the Act, 21 U.S.C. section 360bbb-3(b)(1), unless the authorization is terminated or revoked.  Performed at [facility], [place] [address] [place], [place] [address]          Radiology Studies: CT ABDOMEN PELVIS WO CONTRAST  Result Date: 03/11/2020 CLINICAL DATA:  Recent diagnosis of UTI. EXAM: CT ABDOMEN AND PELVIS WITHOUT CONTRAST TECHNIQUE: Multidetector CT imaging of the abdomen and pelvis was performed following the standard protocol without IV contrast. COMPARISON:  01/19/2020 FINDINGS: Lower chest: There is a stable small pulmonary nodule in the lingula. There is atelectasis at the left lung base.The heart size is normal. Hepatobiliary: The liver is normal. The gallbladder is distended.There is no biliary ductal dilation. Pancreas: Normal contours without ductal dilatation. No peripancreatic fluid collection. Spleen: Unremarkable. Adrenals/Urinary Tract: --Adrenal glands: Unremarkable. --Right kidney/ureter: The patient is status post right-sided nephrectomy. --Left kidney/ureter: There is moderate left-sided hydroureteronephrosis to the level of the patient's urinary bladder. --Urinary  bladder: The urinary bladder is significantly distended. Multiple bladder diverticula are noted. There is a significant amount of layering debris within the dependent portion of the urinary bladder. There is bladder wall thickening with adjacent fat stranding. Stomach/Bowel: --Stomach/Duodenum: No hiatal hernia or other gastric abnormality. Normal duodenal course and caliber. --Small bowel: Unremarkable. --Colon: Rectosigmoid diverticulosis without acute inflammation. --Appendix: Not visualized. No right lower quadrant inflammation or free fluid. Vascular/Lymphatic: Atherosclerotic calcification is present within the non-aneurysmal abdominal aorta, without hemodynamically significant stenosis. --No retroperitoneal lymphadenopathy. --No mesenteric lymphadenopathy. --No pelvic or inguinal lymphadenopathy. Reproductive: The patient is status post prior hysterectomy. There is a 3.2 cm cystic mass involving the left ovary. This is minimally changed since 2018, consistent with a benign process. Other: No ascites or free air. The abdominal wall is normal. Musculoskeletal. There are advanced degenerative changes throughout the lumbar spine.  The patient is status post prior vertebral augmentation of the L1 vertebral body. IMPRESSION: 1. Moderate left-sided hydroureteronephrosis to the level of the patient's urinary bladder. 2. Distended urinary bladder with multiple bladder diverticula. There is diffuse bladder wall thickening with adjacent fat stranding concerning for cystitis. 3. Debris is noted within the dependent portion of the urinary bladder. Correlation with urinalysis is recommended. 4. Status post right-sided nephrectomy. 5. Rectosigmoid diverticulosis without acute inflammation. Aortic Atherosclerosis (ICD10-I70.0). Electronically Signed   By: Constance Holster M.D.   On: 03/11/2020 20:12        Scheduled Meds: . amLODipine  2.5 mg Oral Daily  . aspirin EC  81 mg Oral Daily  . levETIRAcetam  250 mg Oral  BID  . rosuvastatin  10 mg Oral Daily   Continuous Infusions:   LOS: 0 days    Time spent: 35 mins.More than 50% of that time was spent in counseling and/or coordination of care.      Shelly Coss, MD [facility] P12/09/2019, 8:27 AM

## 2020-03-12 NOTE — TOC Initial Note (Signed)
Transition of Care [facility]) - Initial/Assessment Note    Patient Details  Name: [name] [name] MRN: [id number] Date of Birth: 1928-10-20  [facility] CM/SW Contact:    Lia Hopping, [place] Phone Number: 03/12/2020, 3:52 PM  Clinical Narrative:      Re: return to ALF            Patient will return to [facility]F.  CSW reached out to the patient sister Hoyle Sauer to discuss pt. discharge plan. Hoyle Sauer reports the patient uses a wheelchair baseline however she tries to get up on her own and falls. The facility assist the patient with baths and getting dress.  CSW reached out to facility and notified them of the patient potential to discharge tomorrow. CSW spoke to Kenya the Scientist, physiological. She understands the  patient will return to the facility with the [place]. The facility has been in conversation with sister about a high level of care at SNF, if the patient continues to fall. The facility will assist with her transition to SNF if/when needed.  Facility has asked for an FL2 to be sent to [phone], covid test.   [facility] staff will continue to follow this patient.    Expected Discharge Plan: Assisted Living Barriers to Discharge: Continued Medical Work up   Patient Goals and CMS Choice Patient states their goals for this hospitalization and ongoing recovery are:: return to [facility]      Expected Discharge Plan and Services Expected Discharge Plan: Assisted Living In-house Referral: Clinical Social Work Discharge Planning Services: CM Consult   Living arrangements for the past 2 months: [place]                                      Prior Living Arrangements/Services Living arrangements for the past 2 months: [place] Lives with:: Facility Resident Patient language and need for interpreter reviewed:: No Do you feel safe going back to the place where you live?: Yes      Need for Family Participation in  Patient Care: Yes (Comment) Care giver support system in place?: Yes (comment)   Criminal Activity/Legal Involvement Pertinent to Current Situation/Hospitalization: No - Comment as needed  Activities of Daily Living Home Assistive Devices/Equipment: Dentures (specify type), Eyeglasses, Walker (specify type), Grab bars around toilet, Grab bars in shower, Hand-held shower hose, Blood pressure cuff, Scales (front wheeled walker) ADL Screening (condition at time of admission) Patient's cognitive ability adequate to safely complete daily activities?: Yes Is the patient deaf or have difficulty hearing?: No Does the patient have difficulty seeing, even when wearing glasses/contacts?: No Does the patient have difficulty concentrating, remembering, or making decisions?: Yes Patient able to express need for assistance with ADLs?: Yes Does the patient have difficulty dressing or bathing?: Yes Independently performs ADLs?: No Communication: Independent Dressing (OT): Needs assistance Is this a change from baseline?: Pre-admission baseline Grooming: Needs assistance Is this a change from baseline?: Pre-admission baseline Feeding: Needs assistance Is this a change from baseline?: Pre-admission baseline Bathing: Needs assistance Is this a change from baseline?: Pre-admission baseline Toileting: Needs assistance Is this a change from baseline?: Pre-admission baseline In/Out Bed: Needs assistance Is this a change from baseline?: Pre-admission baseline Walks in Home: Needs assistance Is this a change from baseline?: Pre-admission baseline Does the patient have difficulty walking or climbing stairs?: Yes (secondary to weakness) Weakness of Legs: Both  Weakness of Arms/Hands: None  Permission Sought/Granted Permission sought to share information with : Case Manager Permission granted to share information with : Yes, Verbal Permission Granted  Share Information with NAME: Reed,Carolyn  Permission  granted to share info w AGENCY: [place] granted to share info w Relationship: Daughter  Permission granted to share info w Contact Information: [phone]  Emotional Assessment Appearance:: Appears stated age Attitude/Demeanor/Rapport: Engaged Affect (typically observed): Accepting, Pleasant Orientation: : Oriented to Self, Oriented to Place, Oriented to  Time, Oriented to Situation Alcohol / Substance Use: Not Applicable Psych Involvement: No (comment)  Admission diagnosis:  Hyperkalemia [E87.5] Lower urinary tract infectious disease [N39.0] Recurrent UTI [N39.0] AKI (acute kidney injury) ([place]) [N17.9] UTI (urinary tract infection) [N39.0] Patient Active Problem List   Diagnosis Date Noted  . Recurrent UTI 03/11/2020  . Exudative age-related macular degeneration of right eye with inactive choroidal neovascularization ([place]) 12/20/2019  . Degenerative retinal drusen of both eyes 12/20/2019  . Exudative age-related macular degeneration of left eye with inactive choroidal neovascularization ([place]) 12/20/2019  . Traumatic subdural hematoma ([place]) 08/08/2018  . Pyelonephritis 09/09/2016  . Falls frequently 06/24/2016  . Tension headache 11/10/2013  . Chest pain 11/03/2011  . ARF (acute renal failure) ([place]) 04/30/2011  . UTI (urinary tract infection) 04/30/2011  . CKD (chronic kidney disease), stage IV ([place]) 04/30/2011  . Anemia 04/30/2011  . S/p nephrectomy 04/30/2011  . Macular degeneration 04/30/2011  . Cataract 04/30/2011  . Bladder cancer ([place]) 04/30/2011  . Weakness generalized 04/29/2011  . Seizure disorder ([place]) 04/29/2011  . Benign hypertension 04/29/2011  . CAD (coronary artery disease) 04/29/2011  . Hyperlipidemia 04/29/2011  . Anxiety 04/29/2011  . Dizziness - light-headed 04/29/2011  . Chronic abdominal pain 04/29/2011  . Mixed hyperlipidemia 05/24/2008  . Essential hypertension, benign 05/24/2008  . Postsurgical aortocoronary bypass status  05/24/2008   PCP:  Celene Squibb, MD Pharmacy:   [facility] [place], [place] [address] Phone: [phone] Fax: [phone]     Social Determinants of Health (SDOH) Interventions    Readmission Risk Interventions No flowsheet data found.

## 2020-03-12 NOTE — Progress Notes (Signed)
Pharmacy Antibiotic Note  [name] [name] is a 86 y.o. female admitted on 03/11/2020 with abdominal pain and suprapubic pain.  Pharmacy has been consulted for meropenem dosing given history of ESBL E.coli UTI in Oct 2021.  Plan: Meropenem 500mg  IV q12h Follow up renal function & cultures  Height: 5\' 2"  (157.5 cm) Weight: 68 kg (150 lb) IBW/kg (Calculated) : 50.1  Temp (24hrs), Avg:98.2 F (36.8 C), Min:97.9 F (36.6 C), Max:98.5 F (36.9 C)  Recent Labs  Lab 03/11/20 1526 03/11/20 1920 03/11/20 2006 03/12/20 0514  WBC 6.6  --   --  7.4  CREATININE 3.63*  --   --  3.27*  LATICACIDVEN  --  1.3 1.4  --     Estimated Creatinine Clearance: 10.6 mL/min (A) (by C-G formula based on SCr of 3.27 mg/dL (H)).    Allergies  Allergen Reactions  . Adhesive [Tape] Other (See Comments)    Tears patients skin off.   [name] [Cephalexin] Nausea And Vomiting  . Latex Itching  . Penicillins Hives and Itching  . Sulfa Antibiotics Hives and Itching    Antimicrobials this admission: 12/5 Meropenem >>  Dose adjustments this admission:  Microbiology results: 12/5 BCx: sent 12/6 UCx:  12/6 C.diff:  Thank you for allowing pharmacy to be a part of this patient's care.  Peggyann Juba, PharmD, BCPS Pharmacy: [phone] 03/12/2020 8:49 AM

## 2020-03-13 DIAGNOSIS — N390 Urinary tract infection, site not specified: Secondary | ICD-10-CM | POA: Diagnosis not present

## 2020-03-13 LAB — URINE CULTURE: Culture: NO GROWTH

## 2020-03-13 LAB — BASIC METABOLIC PANEL
Anion gap: 12 (ref 5–15)
BUN: 41 mg/dL — ABNORMAL HIGH (ref 8–23)
CO2: 20 mmol/L — ABNORMAL LOW (ref 22–32)
Calcium: 8.9 mg/dL (ref 8.9–10.3)
Chloride: 110 mmol/L (ref 98–111)
Creatinine, Ser: 3.27 mg/dL — ABNORMAL HIGH (ref 0.44–1.00)
GFR, Estimated: 13 mL/min — ABNORMAL LOW (ref >60)
Glucose, Bld: 90 mg/dL (ref 70–99)
Potassium: 4.7 mmol/L (ref 3.5–5.1)
Sodium: 142 mmol/L (ref 135–145)

## 2020-03-13 MED ORDER — SODIUM BICARBONATE 650 MG PO TABS
650.0000 mg | ORAL_TABLET | Freq: Two times a day (BID) | ORAL | Status: DC
  Administered 2020-03-13: 650 mg via ORAL
  Filled 2020-03-13 (×2): qty 1

## 2020-03-13 MED ORDER — HYDRALAZINE HCL 25 MG PO TABS
25.0000 mg | ORAL_TABLET | Freq: Three times a day (TID) | ORAL | Status: DC
  Administered 2020-03-13: 14:00:00 25 mg via ORAL
  Filled 2020-03-13: qty 1

## 2020-03-13 MED ORDER — AMLODIPINE BESYLATE 10 MG PO TABS
10.0000 mg | ORAL_TABLET | Freq: Every day | ORAL | 1 refills | Status: DC
Start: 2020-03-14 — End: 2020-06-08

## 2020-03-13 MED ORDER — SODIUM BICARBONATE 650 MG PO TABS
650.0000 mg | ORAL_TABLET | Freq: Two times a day (BID) | ORAL | 1 refills | Status: DC
Start: 2020-03-13 — End: 2020-06-08

## 2020-03-13 MED ORDER — FOSFOMYCIN TROMETHAMINE 3 G PO PACK
3.0000 g | PACK | Freq: Once | ORAL | Status: AC
  Administered 2020-03-13: 12:00:00 3 g via ORAL
  Filled 2020-03-13: qty 3

## 2020-03-13 MED ORDER — SODIUM CHLORIDE 0.9 % IV SOLN
500.0000 mg | Freq: Three times a day (TID) | INTRAVENOUS | Status: AC
  Administered 2020-03-13: 500 mg via INTRAVENOUS
  Filled 2020-03-13: qty 500

## 2020-03-13 NOTE — [place] FL2 (Addendum)
[place] MEDICAID FL2 LEVEL OF CARE SCREENING TOOL     IDENTIFICATION  Patient Name: [name] [name] Birthdate: 1929-02-01 Sex: female Admission Date (Current Location): 03/11/2020  [facility] and [place] Number:  Herbalist and Address:  [facility],  [address] [place], [place]      Provider Number: [id number]  Attending Physician Name and Address:  Shelly Coss, MD  Relative Name and Phone Number:  Reed,Carolyn Sister [phone]    Current Level of Care: Hospital Recommended Level of Care: Onset Prior Approval Number:    Date Approved/Denied:   PASRR Number:    Discharge Plan: Other (Comment) ([place])    Current Diagnoses: Patient Active Problem List   Diagnosis Date Noted  . Recurrent UTI 03/11/2020  . Exudative age-related macular degeneration of right eye with inactive choroidal neovascularization ([place]) 12/20/2019  . Degenerative retinal drusen of both eyes 12/20/2019  . Exudative age-related macular degeneration of left eye with inactive choroidal neovascularization ([place]) 12/20/2019  . Traumatic subdural hematoma ([place]) 08/08/2018  . Pyelonephritis 09/09/2016  . Falls frequently 06/24/2016  . Tension headache 11/10/2013  . Chest pain 11/03/2011  . ARF (acute renal failure) ([place]) 04/30/2011  . UTI (urinary tract infection) 04/30/2011  . CKD (chronic kidney disease), stage IV ([place]) 04/30/2011  . Anemia 04/30/2011  . S/p nephrectomy 04/30/2011  . Macular degeneration 04/30/2011  . Cataract 04/30/2011  . Bladder cancer ([place]) 04/30/2011  . Weakness generalized 04/29/2011  . Seizure disorder ([place]) 04/29/2011  . Benign hypertension 04/29/2011  . CAD (coronary artery disease) 04/29/2011  . Hyperlipidemia 04/29/2011  . Anxiety 04/29/2011  . Dizziness - light-headed 04/29/2011  . Chronic abdominal pain 04/29/2011  . Mixed hyperlipidemia 05/24/2008  . Essential hypertension, benign 05/24/2008  .  Postsurgical aortocoronary bypass status 05/24/2008    Orientation RESPIRATION BLADDER Height & Weight     Self, Time, Situation, Place  Normal Indwelling catheter Weight: 150 lb (68 kg) Height:  5\' 2"  (157.5 cm)  BEHAVIORAL SYMPTOMS/MOOD NEUROLOGICAL BOWEL NUTRITION STATUS      Continent Diet: Regular   AMBULATORY STATUS COMMUNICATION OF NEEDS Skin   Extensive Assist Verbally Normal                       Personal Care Assistance Level of Assistance  Bathing, Feeding, Dressing Bathing Assistance: Maximum assistance Feeding assistance: Independent Dressing Assistance: Limited assistance     Functional Limitations Info  Sight, Hearing, Speech Sight Info: Adequate Hearing Info: Adequate Speech Info: Adequate    SPECIAL CARE FACTORS FREQUENCY   PT/OT   3x/week                  Contractures Contractures Info: Not present    Additional Factors Info  Code Status, Allergies Code Status Info: DNR Allergies Info: Allergies: Adhesive Tape, Keflet (Cephalexin), Latex, Penicillins, Sulfa Antibiotics           Current Medications (03/13/2020):  Medication List    TAKE these medications   acetaminophen 325 MG tablet Commonly known as: TYLENOL Take 650 mg by mouth every 8 (eight) hours. With hydrocodone   amLODipine 10 MG tablet Commonly known as: NORVASC Take 1 tablet (10 mg total) by mouth daily. Start taking on: March 14, 2020 What changed:   medication strength  how much to take   aspirin EC 81 MG tablet Take 81 mg by mouth daily. Swallow whole.   AZO Bladder Control/Go-Less Caps Take 1 capsule by  mouth daily.   Banophen 25 mg capsule Generic drug: diphenhydrAMINE Take 25 mg by mouth every 8 (eight) hours as needed for itching. What changed: Another medication with the same name was removed. Continue taking this medication, and follow the directions you see here.   Belsomra 10 MG Tabs Generic drug: Suvorexant Take 10 mg by mouth at  bedtime.   clonazePAM 0.5 MG tablet Commonly known as: KLONOPIN Take 1 tablet (0.5 mg total) by mouth at bedtime. What changed: when to take this   Crestor 10 MG tablet Generic drug: rosuvastatin Take 10 mg by mouth daily.   gabapentin 300 MG capsule Commonly known as: NEURONTIN Take 1 capsule (300 mg total) by mouth at bedtime.   Gemtesa 75 MG Tabs Generic drug: Vibegron Take 75 mg by mouth daily.   HYDROcodone-acetaminophen 5-325 MG tablet Commonly known as: NORCO/VICODIN Take 1 tablet by mouth every 8 (eight) hours. What changed: Another medication with the same name was removed. Continue taking this medication, and follow the directions you see here.   levETIRAcetam 250 MG tablet Commonly known as: KEPPRA Take 250 mg by mouth 2 (two) times daily.   Linzess 290 MCG Caps capsule Generic drug: linaclotide Take 290 mcg by mouth daily.   Lokelma 10 g Pack packet Generic drug: sodium zirconium cyclosilicate Take 10 g by mouth See admin instructions. Takes twice weekly on Monday and Thursday   Myrbetriq 25 MG Tb24 tablet Generic drug: mirabegron ER Take 25 mg by mouth daily.   NONFORMULARY OR COMPOUNDED ITEM Apply 1 application topically in the morning and at bedtime. Anti -itch lotion 0.5-0.5% ( Mix with triamcinolone)   polyethylene glycol 17 g packet Commonly known as: MIRALAX / GLYCOLAX Take 17 g by mouth every other day. As needed for constipation.   PRESERVISION AREDS PO Take 1 capsule by mouth daily.   sodium bicarbonate 650 MG tablet Take 1 tablet (650 mg total) by mouth 2 (two) times daily.   tizanidine 2 MG capsule Commonly known as: ZANAFLEX Take 2 mg by mouth 2 (two) times daily as needed for muscle spasms.   triamcinolone 0.1 % Commonly known as: KENALOG Apply 1 application topically 2 (two) times daily. Mix with Sarna lotion        Discharge Medications: Please see discharge summary for a list of discharge medications.   Relevant Imaging Results:  Relevant Lab Results:   Additional Information [phone].   Lia Hopping, LCSW

## 2020-03-13 NOTE — TOC Transition Note (Addendum)
Transition of Care [facility]) - CM/SW Discharge Note   Patient Details  Name: [name] [name] MRN: [id number] Date of Birth: 1928-10-15  [facility] CM/SW Contact:  Lia Hopping, [place] Phone Number: 03/13/2020, 12:53 PM   Clinical Narrative:    [facility] will accept the patient today.  Facility staff Santiago Glad reviewed FL2, D/C summary.  Home Health ordered faxed for HHPT-active with [facility] called to transport.    Final next level of care: Assisted Living Barriers to Discharge: Barriers Resolved   Patient Goals and CMS Choice Patient states their goals for this hospitalization and ongoing recovery are:: return to [place]      Discharge Placement                       Discharge Plan and Services In-house Referral: Clinical Social Work Discharge Planning Services: CM Consult                                 Social Determinants of Health (SDOH) Interventions     Readmission Risk Interventions No flowsheet data found.

## 2020-03-13 NOTE — Discharge Summary (Signed)
Physician Discharge Summary  TAYLAR HARTSOUGH TSV:[id number] DOB: 1929-01-25 DOA: 03/11/2020  PCP: Celene Squibb, MD  Admit date: 03/11/2020 Discharge date: 03/13/2020  Admitted From: Home Disposition:  Home  Discharge Condition:Stable CODE STATUS:DNR Diet recommendation: Heart Healthy    Brief/Interim Summary:  Patient is 86 year old female with history of seizure disorder, hypertension, CKD status for status post right-sided nephrectomy, bladder cancer who presents to the emergency department with complaints of abdomen pain, nausea, vomiting, decreased oral intake.  Patient has history of frequent UTIs in the past.  She was weak for last 3 to 4 days.  Patient ambulates with the help of wheelchair.  She was recently treated for UTI. on presentation she was hemodynamically stable with blood pressure on the higher range.  Patient was admitted for the management of chronic cystitis.  Foley was placed after admission.  Her recent urine culture had shown ESBL E. coli.  Urology was also consulted.  She got 3 days of meropenem and was also treated with dose of fosfomycin.  Currently she does not have abdominal pain, no leukocytosis or fever.  She is medically stable for discharge to ALF today.  Following problems were addressed during her hospitalization:  Abdominal pain: Presented with lower abdominal discomfort/dysuria Abdominal CT showed moderate left-sided hydroureteronephrosis to the level of the patient's urinary bladder.as per the urology, Dr. Jeffie Pollock, this is nothing new.  CT showed distended urinary bladder with multiple bladder diverticula., diffuse bladder wall thickening with adjacent fat stranding concerning for cystitis.  We have consulted urology.  Foley catheter placed in the emergency department and it will be continued on DC. Abdominal pain has resolved.  Her abdomen is soft and nontender.  Suspected UTI/chronic cystitis: Urine culture on 01/19/20 showed ESBL E. coli.    She was treated with  3 days of meropenem.  Imaging showed possibility of chronic cystitis.  Patient does not have any leukocytosis.  Lactate level normal.  She was also given a dose of fosfomycin.  AKI on SKD stage 4: Baseline creatinine around 2.5-2.9.  Patient with creatinine in the range of 3.    Was given gentle IV fluids Patient is a status post right nephrectomy because of malignancy. Check BMP in a week  Hyperkalemia: Resolved  Diarrhea:  Currently denies any diarrhea  Hypertension: We will continue amlod 10 mg daily.  Monitor blood pressure.  Hyperlipidemia: Continue Crestor   Discharge Diagnoses:  Active Problems:   Essential hypertension, benign   Seizure disorder (HCC)   UTI (urinary tract infection)   CKD (chronic kidney disease), stage IV (HCC)   S/p nephrectomy   Bladder cancer ([place])   Recurrent UTI    Discharge Instructions  Discharge Instructions    Diet - low sodium heart healthy   Complete by: As directed    Discharge instructions   Complete by: As directed    1)Please take prescribed medications as instructed. 2)Follow up with your PCP in a week.  Do a BMP test during the follow-up 3)Follow up with Dr. Jeffie Pollock in 2-4 weeks.  You will be called for appointment   Increase activity slowly   Complete by: As directed      Allergies as of 03/13/2020      Reactions   Adhesive [tape] Other (See Comments)   Tears patients skin off.    Keflet [cephalexin] Nausea And Vomiting   Latex Itching   Penicillins Hives, Itching   Sulfa Antibiotics Hives, Itching      Medication List  TAKE these medications   acetaminophen 325 MG tablet Commonly known as: TYLENOL Take 650 mg by mouth every 8 (eight) hours. With hydrocodone   amLODipine 10 MG tablet Commonly known as: NORVASC Take 1 tablet (10 mg total) by mouth daily. Start taking on: March 14, 2020 What changed:   medication strength  how much to take   aspirin EC 81 MG tablet Take 81 mg by mouth daily. Swallow  whole.   AZO Bladder Control/Go-Less Caps Take 1 capsule by mouth daily.   Banophen 25 mg capsule Generic drug: diphenhydrAMINE Take 25 mg by mouth every 8 (eight) hours as needed for itching. What changed: Another medication with the same name was removed. Continue taking this medication, and follow the directions you see here.   Belsomra 10 MG Tabs Generic drug: Suvorexant Take 10 mg by mouth at bedtime.   clonazePAM 0.5 MG tablet Commonly known as: KLONOPIN Take 1 tablet (0.5 mg total) by mouth at bedtime. What changed: when to take this   Crestor 10 MG tablet Generic drug: rosuvastatin Take 10 mg by mouth daily.   gabapentin 300 MG capsule Commonly known as: NEURONTIN Take 1 capsule (300 mg total) by mouth at bedtime.   Gemtesa 75 MG Tabs Generic drug: Vibegron Take 75 mg by mouth daily.   HYDROcodone-acetaminophen 5-325 MG tablet Commonly known as: NORCO/VICODIN Take 1 tablet by mouth every 8 (eight) hours. What changed: Another medication with the same name was removed. Continue taking this medication, and follow the directions you see here.   levETIRAcetam 250 MG tablet Commonly known as: KEPPRA Take 250 mg by mouth 2 (two) times daily.   Linzess 290 MCG Caps capsule Generic drug: linaclotide Take 290 mcg by mouth daily.   Lokelma 10 g Pack packet Generic drug: sodium zirconium cyclosilicate Take 10 g by mouth See admin instructions. Takes twice weekly on Monday and Thursday   Myrbetriq 25 MG Tb24 tablet Generic drug: mirabegron ER Take 25 mg by mouth daily.   NONFORMULARY OR COMPOUNDED ITEM Apply 1 application topically in the morning and at bedtime. Anti -itch lotion 0.5-0.5% ( Mix with triamcinolone)   polyethylene glycol 17 g packet Commonly known as: MIRALAX / GLYCOLAX Take 17 g by mouth every other day. As needed for constipation.   PRESERVISION AREDS PO Take 1 capsule by mouth daily.   sodium bicarbonate 650 MG tablet Take 1 tablet (650 mg  total) by mouth 2 (two) times daily.   tizanidine 2 MG capsule Commonly known as: ZANAFLEX Take 2 mg by mouth 2 (two) times daily as needed for muscle spasms.   triamcinolone 0.1 % Commonly known as: KENALOG Apply 1 application topically 2 (two) times daily. Mix with Sarna lotion       Contact information for follow-up providers    Celene Squibb, MD. Schedule an appointment as soon as possible for a visit in 1 week(s).   Specialty: Internal Medicine Contact information: [address] [place] [facility] [address] [phone]        Irine Seal, MD. Schedule an appointment as soon as possible for a visit in 2 week(s).   Specialty: Urology Contact information: [address] [place] [address] [phone]            Contact information for after-discharge care    Destination    [facility] .   Service: Assisted Living Contact information: [place] [phone]  Allergies  Allergen Reactions  . Adhesive [Tape] Other (See Comments)    Tears patients skin off.   [name] [Cephalexin] Nausea And Vomiting  . Latex Itching  . Penicillins Hives and Itching  . Sulfa Antibiotics Hives and Itching    Consultations:  Urology   Procedures/Studies: CT ABDOMEN PELVIS WO CONTRAST  Result Date: 03/11/2020 CLINICAL DATA:  Recent diagnosis of UTI. EXAM: CT ABDOMEN AND PELVIS WITHOUT CONTRAST TECHNIQUE: Multidetector CT imaging of the abdomen and pelvis was performed following the standard protocol without IV contrast. COMPARISON:  01/19/2020 FINDINGS: Lower chest: There is a stable small pulmonary nodule in the lingula. There is atelectasis at the left lung base.The heart size is normal. Hepatobiliary: The liver is normal. The gallbladder is distended.There is no biliary ductal dilation. Pancreas: Normal contours without ductal dilatation. No peripancreatic fluid collection. Spleen: Unremarkable.  Adrenals/Urinary Tract: --Adrenal glands: Unremarkable. --Right kidney/ureter: The patient is status post right-sided nephrectomy. --Left kidney/ureter: There is moderate left-sided hydroureteronephrosis to the level of the patient's urinary bladder. --Urinary bladder: The urinary bladder is significantly distended. Multiple bladder diverticula are noted. There is a significant amount of layering debris within the dependent portion of the urinary bladder. There is bladder wall thickening with adjacent fat stranding. Stomach/Bowel: --Stomach/Duodenum: No hiatal hernia or other gastric abnormality. Normal duodenal course and caliber. --Small bowel: Unremarkable. --Colon: Rectosigmoid diverticulosis without acute inflammation. --Appendix: Not visualized. No right lower quadrant inflammation or free fluid. Vascular/Lymphatic: Atherosclerotic calcification is present within the non-aneurysmal abdominal aorta, without hemodynamically significant stenosis. --No retroperitoneal lymphadenopathy. --No mesenteric lymphadenopathy. --No pelvic or inguinal lymphadenopathy. Reproductive: The patient is status post prior hysterectomy. There is a 3.2 cm cystic mass involving the left ovary. This is minimally changed since 2018, consistent with a benign process. Other: No ascites or free air. The abdominal wall is normal. Musculoskeletal. There are advanced degenerative changes throughout the lumbar spine. The patient is status post prior vertebral augmentation of the L1 vertebral body. IMPRESSION: 1. Moderate left-sided hydroureteronephrosis to the level of the patient's urinary bladder. 2. Distended urinary bladder with multiple bladder diverticula. There is diffuse bladder wall thickening with adjacent fat stranding concerning for cystitis. 3. Debris is noted within the dependent portion of the urinary bladder. Correlation with urinalysis is recommended. 4. Status post right-sided nephrectomy. 5. Rectosigmoid diverticulosis  without acute inflammation. Aortic Atherosclerosis (ICD10-I70.0). Electronically Signed   By: Constance Holster M.D.   On: 03/11/2020 20:12       Subjective: Patient seen and examined at the bedside this morning.  Medically stable for discharge today  Discharge Exam: Vitals:   03/13/20 0147 03/13/20 0609  BP: (!) 149/65 (!) 168/74  Pulse: 67 63  Resp: 17 18  Temp: 97.6 F (36.4 C) 97.8 F (36.6 C)  SpO2: 98% 97%   Vitals:   03/12/20 1319 03/12/20 2153 03/13/20 0147 03/13/20 0609  BP: (!) 165/66 (!) 149/67 (!) 149/65 (!) 168/74  Pulse: 68 75 67 63  Resp: 17 17 17 18   Temp: 98.3 F (36.8 C) 98 F (36.7 C) 97.6 F (36.4 C) 97.8 F (36.6 C)  TempSrc: Oral Oral    SpO2: 99% 95% 98% 97%  Weight:      Height:        General: Pt is alert, awake, not in acute distress, pleasant elderly female Cardiovascular: RRR, S1/S2 +, no rubs, no gallops Respiratory: CTA bilaterally, no wheezing, no rhonchi Abdominal: Soft, NT, ND, bowel sounds + Extremities: no edema, no cyanosis GU: Foley  The results of significant diagnostics from this hospitalization (including imaging, microbiology, ancillary and laboratory) are listed below for reference.     Microbiology: Recent Results (from the past 240 hour(s))  Resp Panel by RT-PCR (Flu A&B, Covid) Nasopharyngeal Swab     Status: None   Collection Time: 03/11/20  4:24 PM   Specimen: Nasopharyngeal Swab; Nasopharyngeal(NP) swabs in vial transport medium  Result Value Ref Range Status   SARS Coronavirus 2 by RT PCR NEGATIVE NEGATIVE Final    Comment: (NOTE) SARS-CoV-2 target nucleic acids are NOT DETECTED.  The SARS-CoV-2 RNA is generally detectable in upper respiratory specimens during the acute phase of infection. The lowest concentration of SARS-CoV-2 viral copies this assay can detect is 138 copies/mL. A negative result does not preclude SARS-Cov-2 infection and should not be used as the sole basis for treatment or other  patient management decisions. A negative result may occur with  improper specimen collection/handling, submission of specimen other than nasopharyngeal swab, presence of viral mutation(s) within the areas targeted by this assay, and inadequate number of viral copies(<138 copies/mL). A negative result must be combined with clinical observations, patient history, and epidemiological information. The expected result is Negative.  Fact Sheet for Patients:  EntrepreneurPulse.com.au  Fact Sheet for Healthcare Providers:  IncredibleEmployment.be  This test is no t yet approved or cleared by the [place] FDA and  has been authorized for detection and/or diagnosis of SARS-CoV-2 by FDA under an Emergency Use Authorization (EUA). This EUA will remain  in effect (meaning this test can be used) for the duration of the COVID-19 declaration under Section 564(b)(1) of the Act, 21 U.S.C.section 360bbb-3(b)(1), unless the authorization is terminated  or revoked sooner.       Influenza A by PCR NEGATIVE NEGATIVE Final   Influenza B by PCR NEGATIVE NEGATIVE Final    Comment: (NOTE) The Xpert Xpress SARS-CoV-2/FLU/RSV plus assay is intended as an aid in the diagnosis of influenza from Nasopharyngeal swab specimens and should not be used as a sole basis for treatment. Nasal washings and aspirates are unacceptable for Xpert Xpress SARS-CoV-2/FLU/RSV testing.  Fact Sheet for Patients: EntrepreneurPulse.com.au  Fact Sheet for Healthcare Providers: IncredibleEmployment.be  This test is not yet approved or cleared by the [place] FDA and has been authorized for detection and/or diagnosis of SARS-CoV-2 by FDA under an Emergency Use Authorization (EUA). This EUA will remain in effect (meaning this test can be used) for the duration of the COVID-19 declaration under Section 564(b)(1) of the Act, 21 U.S.C. section  360bbb-3(b)(1), unless the authorization is terminated or revoked.  Performed at [facility], [place] [address] [place], [place] [address]   Culture, blood (routine x 2)     Status: None (Preliminary result)   Collection Time: 03/11/20  7:20 PM   Specimen: BLOOD  Result Value Ref Range Status   Specimen Description   Final    BLOOD LEFT ANTECUBITAL Performed at [facility] [address] [place], [place] [address]    Special Requests   Final    BOTTLES DRAWN AEROBIC ONLY Blood Culture results may not be optimal due to an inadequate volume of blood received in culture bottles Performed at [facility] [address] [place], [place] [address]    Culture   Final    NO GROWTH 1 DAY Performed at [facility], [place] [address] [place], [place] [address]    Report Status PENDING  Incomplete  Culture, blood (  routine x 2)     Status: None (Preliminary result)   Collection Time: 03/11/20  8:06 PM   Specimen: BLOOD  Result Value Ref Range Status   Specimen Description   Final    BLOOD RIGHT WRIST Performed at [facility] [address], [place], [place] [address]    Special Requests   Final    BOTTLES DRAWN AEROBIC AND ANAEROBIC Blood Culture adequate volume Performed at [facility] [address], [place], [place] [address]    Culture   Final    NO GROWTH 1 DAY Performed at [facility], [place] [address] [place], [place] [address]    Report Status PENDING  Incomplete     Labs: BNP (last 3 results) No results for input(s): BNP in the last 8760 hours. Basic Metabolic Panel: Recent Labs  Lab 03/11/20 1526 03/12/20 0514 03/13/20 0530  NA 142 143 142  K 5.5* 4.8 4.7  CL 113* 112* 110  CO2 18* 19* 20*  GLUCOSE 102* 113* 90  BUN 42* 37* 41*  CREATININE 3.63* 3.27* 3.27*  CALCIUM 9.2 8.9 8.9   Liver Function Tests: Recent Labs  Lab 03/11/20 1526 03/12/20 0514   AST 27 20  ALT 12 12  ALKPHOS 78 83  BILITOT 0.5 0.7  PROT 7.5 7.0  ALBUMIN 3.8 3.3*   Recent Labs  Lab 03/11/20 1526  LIPASE 41   No results for input(s): AMMONIA in the last 168 hours. CBC: Recent Labs  Lab 03/11/20 1526 03/12/20 0514  WBC 6.6 7.4  NEUTROABS 3.2  --   HGB 12.2 12.2  HCT 41.1 39.2  MCV 99.3 93.6  PLT 159 198   Cardiac Enzymes: No results for input(s): CKTOTAL, CKMB, CKMBINDEX, TROPONINI in the last 168 hours. BNP: Invalid input(s): POCBNP CBG: No results for input(s): GLUCAP in the last 168 hours. D-Dimer No results for input(s): DDIMER in the last 72 hours. Hgb A1c Recent Labs    03/11/20 1920  HGBA1C 6.2*   Lipid Profile No results for input(s): CHOL, HDL, LDLCALC, TRIG, CHOLHDL, LDLDIRECT in the last 72 hours. Thyroid function studies No results for input(s): TSH, T4TOTAL, T3FREE, THYROIDAB in the last 72 hours.  Invalid input(s): FREET3 Anemia work up No results for input(s): VITAMINB12, FOLATE, FERRITIN, TIBC, IRON, RETICCTPCT in the last 72 hours. Urinalysis    Component Value Date/Time   COLORURINE YELLOW 03/11/2020 1443   APPEARANCEUR TURBID (A) 03/11/2020 1443   LABSPEC 1.011 03/11/2020 1443   PHURINE 5.0 03/11/2020 1443   GLUCOSEU NEGATIVE 03/11/2020 1443   HGBUR SMALL (A) 03/11/2020 1443   BILIRUBINUR NEGATIVE 03/11/2020 1443   KETONESUR NEGATIVE 03/11/2020 1443   PROTEINUR 100 (A) 03/11/2020 1443   UROBILINOGEN 0.2 06/11/2013 2102   NITRITE POSITIVE (A) 03/11/2020 1443   LEUKOCYTESUR LARGE (A) 03/11/2020 1443   Sepsis Labs Invalid input(s): PROCALCITONIN,  WBC,  LACTICIDVEN Microbiology Recent Results (from the past 240 hour(s))  Resp Panel by RT-PCR (Flu A&B, Covid) Nasopharyngeal Swab     Status: None   Collection Time: 03/11/20  4:24 PM   Specimen: Nasopharyngeal Swab; Nasopharyngeal(NP) swabs in vial transport medium  Result Value Ref Range Status   SARS Coronavirus 2 by RT PCR NEGATIVE NEGATIVE Final     Comment: (NOTE) SARS-CoV-2 target nucleic acids are NOT DETECTED.  The SARS-CoV-2 RNA is generally detectable in upper respiratory specimens during the acute phase of infection. The lowest concentration of SARS-CoV-2 viral copies this assay can detect is  138 copies/mL. A negative result does not preclude SARS-Cov-2 infection and should not be used as the sole basis for treatment or other patient management decisions. A negative result may occur with  improper specimen collection/handling, submission of specimen other than nasopharyngeal swab, presence of viral mutation(s) within the areas targeted by this assay, and inadequate number of viral copies(<138 copies/mL). A negative result must be combined with clinical observations, patient history, and epidemiological information. The expected result is Negative.  Fact Sheet for Patients:  EntrepreneurPulse.com.au  Fact Sheet for Healthcare Providers:  IncredibleEmployment.be  This test is no t yet approved or cleared by the [place] FDA and  has been authorized for detection and/or diagnosis of SARS-CoV-2 by FDA under an Emergency Use Authorization (EUA). This EUA will remain  in effect (meaning this test can be used) for the duration of the COVID-19 declaration under Section 564(b)(1) of the Act, 21 U.S.C.section 360bbb-3(b)(1), unless the authorization is terminated  or revoked sooner.       Influenza A by PCR NEGATIVE NEGATIVE Final   Influenza B by PCR NEGATIVE NEGATIVE Final    Comment: (NOTE) The Xpert Xpress SARS-CoV-2/FLU/RSV plus assay is intended as an aid in the diagnosis of influenza from Nasopharyngeal swab specimens and should not be used as a sole basis for treatment. Nasal washings and aspirates are unacceptable for Xpert Xpress SARS-CoV-2/FLU/RSV testing.  Fact Sheet for Patients: EntrepreneurPulse.com.au  Fact Sheet for Healthcare  Providers: IncredibleEmployment.be  This test is not yet approved or cleared by the [place] FDA and has been authorized for detection and/or diagnosis of SARS-CoV-2 by FDA under an Emergency Use Authorization (EUA). This EUA will remain in effect (meaning this test can be used) for the duration of the COVID-19 declaration under Section 564(b)(1) of the Act, 21 U.S.C. section 360bbb-3(b)(1), unless the authorization is terminated or revoked.  Performed at [facility], [place] [address], [place], [place] [address]   Culture, blood (routine x 2)     Status: None (Preliminary result)   Collection Time: 03/11/20  7:20 PM   Specimen: BLOOD  Result Value Ref Range Status   Specimen Description   Final    BLOOD LEFT ANTECUBITAL Performed at [facility] [address] [place], [place] [address]    Special Requests   Final    BOTTLES DRAWN AEROBIC ONLY Blood Culture results may not be optimal due to an inadequate volume of blood received in culture bottles Performed at [facility] [address], [place], [place] [address]    Culture   Final    NO GROWTH 1 DAY Performed at [facility], [place] [address], [place], [place] [address]    Report Status PENDING  Incomplete  Culture, blood (routine x 2)     Status: None (Preliminary result)   Collection Time: 03/11/20  8:06 PM   Specimen: BLOOD  Result Value Ref Range Status   Specimen Description   Final    BLOOD RIGHT WRIST Performed at [facility] [address], [place], [place] [address]    Special Requests   Final    BOTTLES DRAWN AEROBIC AND ANAEROBIC Blood Culture adequate volume Performed at [facility] [address] [place], [place] [address]    Culture   Final    NO GROWTH 1 DAY Performed at [facility], [place] [address], [place], [place] [address]    Report Status PENDING  Incomplete  Please note: You were cared for by a hospitalist during your hospital stay. Once you are discharged, your primary care physician will handle any further medical issues. Please note that NO REFILLS for any discharge medications will be authorized once you are discharged, as it is imperative that you return to your primary care physician (or establish a relationship with a primary care physician if you do not have one) for your post hospital discharge needs so that they can reassess your need for medications and monitor your lab values.    Time coordinating discharge: 40 minutes  SIGNED:   Shelly Coss, MD  [facility] 03/13/2020, 11:06 AM Pager [phone]  If 7PM-7AM, please contact night-coverage [email or web address] Password [email or web address]

## 2020-03-13 NOTE — Progress Notes (Signed)
Patient's IV was removed and [facility] came to transport patient to [facility].

## 2020-03-13 NOTE — Progress Notes (Signed)
Report called to [place] at [facility].

## 2020-03-13 NOTE — Evaluation (Signed)
Physical Therapy Evaluation Patient Details Name: [name] [name] MRN: [id number] DOB: 1928/12/08 Today's Date: 03/13/2020   History of Present Illness  86 yo female admitted with recurrent UTI. Hx of bladder ca, back pain, CKD, CAD, gait abnormality, frequent falls, macular degeneration, obesity, chronic pain. Pt is admitted from [facility]  On eval, pt required Mod assist for mobility. She was able to stand and pivot to recliner with assistance from therapist. Pt presents with general weakness, and impaired gait and balance. She remains at risk for falls. She is currently not safely able to transfer without assistance. Per chart and SW note, pt is set to return to [facility]. Recommend increased assistance and HH f/u if at all possible.     Follow Up Recommendations Home health PT;Supervision/Assistance - 24 hour (at ALF. Per chart, pt is set to return to [employer].)    Equipment Recommendations  None recommended by PT    Recommendations for Other Services       Precautions / Restrictions Precautions Precautions: Fall Restrictions Weight Bearing Restrictions: No      Mobility  Bed Mobility Overal bed mobility: Needs Assistance Bed Mobility: Supine to Sit     Supine to sit: [facility] elevated;Min assist     General bed mobility comments: Assist for trunk. Increased time. Pt relied on bedrail    Transfers Overall transfer level: Needs assistance   Transfers: Sit to/from Stand;Stand Pivot Transfers Sit to Stand: Mod assist Stand pivot transfers: Mod assist       General transfer comment: Assist to rise, stabilize, control descent. Stand pivot, bed to recliner, with pt holding on to therapist's UEs for support.  Ambulation/Gait             General Gait Details: NT-nonambulatory  Stairs            Wheelchair Mobility    Modified Rankin (Stroke Patients Only)       Balance Overall balance assessment: Needs assistance;History of  Falls   Sitting balance-Leahy Scale: Good     Standing balance support: Bilateral upper extremity supported Standing balance-Leahy Scale: Poor                               Pertinent Vitals/Pain Pain Assessment: Faces Faces Pain Scale: No hurt    Home Living Family/patient expects to be discharged to:: Assisted living                      Prior Function Level of Independence: Needs assistance   Gait / Transfers Assistance Needed: pt reported she transfers to/from wheelchair unassisted.  ADL's / Homemaking Assistance Needed: pt reported she bathes/dresses herself  Comments: per chart, frequent falls at ALF. Pt stated she doesn't receive any assistance     Hand Dominance        Extremity/Trunk Assessment   Upper Extremity Assessment Upper Extremity Assessment: Generalized weakness (pill rolling tremor R UE)    Lower Extremity Assessment Lower Extremity Assessment: Generalized weakness    Cervical / Trunk Assessment Cervical / Trunk Assessment: Normal  Communication   Communication: No difficulties  Cognition Arousal/Alertness: Awake/alert Behavior During Therapy: WFL for tasks assessed/performed Overall Cognitive Status: Within Functional Limits for tasks assessed  General Comments      Exercises     Assessment/Plan    PT Assessment Patient needs continued PT services  PT Problem List Decreased strength;Decreased mobility;Decreased activity tolerance;Decreased balance;Decreased knowledge of use of DME;Decreased safety awareness       PT Treatment Interventions DME instruction;Therapeutic activities;Therapeutic exercise;Patient/family education;Balance training;Functional mobility training    PT Goals (Current goals can be found in the Care Plan section)  Acute Rehab PT Goals Patient Stated Goal: none stated PT Goal Formulation: With patient Time For Goal Achievement:  03/27/20 Potential to Achieve Goals: Fair    Frequency Min 3X/week   Barriers to discharge Decreased caregiver support pt stated she is returning to [facility]. She reports they provide little to no physical assistance.    Co-evaluation               AM-PAC PT "6 Clicks" Mobility  Outcome Measure Help needed turning from your back to your side while in a flat bed without using bedrails?: A Little Help needed moving from lying on your back to sitting on the side of a flat bed without using bedrails?: A Little Help needed moving to and from a bed to a chair (including a wheelchair)?: A Lot Help needed standing up from a chair using your arms (e.g., wheelchair or bedside chair)?: A Lot Help needed to walk in hospital room?: Total Help needed climbing 3-5 steps with a railing? : Total 6 Click Score: 12    End of Session Equipment Utilized During Treatment: Gait belt Activity Tolerance: Patient tolerated treatment well Patient left: in chair;with call bell/phone within reach   PT Visit Diagnosis: Muscle weakness (generalized) (M62.81);History of falling (Z91.81);Repeated falls (R29.6);Difficulty in walking, not elsewhere classified (R26.2)    Time: 6283-1517 PT Time Calculation (min) (ACUTE ONLY): 17 min   Charges:   PT Evaluation $PT Eval Moderate Complexity: [place], PT Acute Rehabilitation  Office: [phone] Pager: [phone]

## 2020-03-17 LAB — CULTURE, BLOOD (ROUTINE X 2)
Culture: NO GROWTH
Culture: NO GROWTH
Special Requests: ADEQUATE

## 2020-03-21 ENCOUNTER — Ambulatory Visit: Payer: Medicare Other | Admitting: Infectious Diseases

## 2020-03-28 ENCOUNTER — Other Ambulatory Visit: Payer: Self-pay

## 2020-03-28 ENCOUNTER — Ambulatory Visit (INDEPENDENT_AMBULATORY_CARE_PROVIDER_SITE_OTHER): Payer: Medicare Other | Admitting: Internal Medicine

## 2020-03-28 ENCOUNTER — Encounter: Payer: Self-pay | Admitting: Internal Medicine

## 2020-03-28 VITALS — Temp 98.0°F | Ht 62.0 in | Wt 163.0 lb

## 2020-03-28 DIAGNOSIS — Z2239 Carrier of other specified bacterial diseases: Secondary | ICD-10-CM | POA: Diagnosis not present

## 2020-03-28 DIAGNOSIS — Z22358 Carrier of other enterobacterales: Secondary | ICD-10-CM

## 2020-03-28 DIAGNOSIS — Z8551 Personal history of malignant neoplasm of bladder: Secondary | ICD-10-CM

## 2020-03-28 DIAGNOSIS — N1330 Unspecified hydronephrosis: Secondary | ICD-10-CM

## 2020-03-28 DIAGNOSIS — N320 Bladder-neck obstruction: Secondary | ICD-10-CM

## 2020-03-28 NOTE — Patient Instructions (Signed)
Thank you for seeing [place] today   It appears you have been having primary bladder spasm/irritation and incontinence leading to your abdominal pain/cramping and irritation  At this time it appears after the foley replacement you are doing better. While it is not an ideal thing to have, it appears to be the lesser of two evils at this time  If you have strong feeling about the foley wanting it out, please discuss with your urology. I have no strong feeling either way in your current situation.    If your urologist is concerned with you having in the future another bout of bladder infection, call our clinic so we can help manage antibiotics.

## 2020-03-28 NOTE — Progress Notes (Signed)
[facility] for Infectious Disease  Patient Active Problem List   Diagnosis Date Noted   Recurrent UTI 03/11/2020   Exudative age-related macular degeneration of right eye with inactive choroidal neovascularization ([place]) 12/20/2019   Degenerative retinal drusen of both eyes 12/20/2019   Exudative age-related macular degeneration of left eye with inactive choroidal neovascularization ([place]) 12/20/2019   Traumatic subdural hematoma ([place]) 08/08/2018   Pyelonephritis 09/09/2016   Falls frequently 06/24/2016   Tension headache 11/10/2013   Chest pain 11/03/2011   ARF (acute renal failure) ([place]) 04/30/2011   UTI (urinary tract infection) 04/30/2011   CKD (chronic kidney disease), stage IV ([place]) 04/30/2011   Anemia 04/30/2011   S/p nephrectomy 04/30/2011   Macular degeneration 04/30/2011   Cataract 04/30/2011   Bladder cancer ([place]) 04/30/2011   Weakness generalized 04/29/2011   Seizure disorder ([place]) 04/29/2011   Benign hypertension 04/29/2011   CAD (coronary artery disease) 04/29/2011   Hyperlipidemia 04/29/2011   Anxiety 04/29/2011   Dizziness - light-headed 04/29/2011   Chronic abdominal pain 04/29/2011   Mixed hyperlipidemia 05/24/2008   Essential hypertension, benign 05/24/2008   Postsurgical aortocoronary bypass status 05/24/2008      Subjective:    Patient ID: [name] [name], female    DOB: 1929/01/16, 86 y.o.   MRN: [id number]  Chief Complaint  Patient presents with   Hospitalization Follow-up    HPI:  [name] [name] is a 86 y.o. female remote history of bladder cancer and urinary incontinence, along with ckd 4 referred from [facility] for complicated uti with resistant bacteria  Hx via patient and provided outside system's note office note [facility] 02/27/20 It appears she has been followed since 11/2018 for urinary incontinence and uti. She was last see 02/27/20, recently treated with fosfomycin for a  bout of cystitis. Her urine was checked again on that day and appears "infected as usual." I reviewd the urine dip 2+blood, 2+protein, positive nitrites and LE, wbc >60 and many bacteria but only 0-2 rbc. She previously had indwelling foley for urinary incontinence that was removed 01/2019. This was at her request due to discomfort. Chart mention 'chronic cystitis but no symptoms and gross hematuria.' there was one episode in 01/19/20 that she presented to the ED with diffuse abd pain/cramping and dysuria with possible gross hematuria but no fever; ct abd showed mild left hydronephrosis and perinephric stranding but no stone; wbc 7.8; cr 2.5. she was treated with presumed pyel with ceftriaxone then cipro. The urine culture at that time grew esbl e. Coli not sensitive to cipro unfortunately. She returned to the urology clinic on 11/5 with persistent symptoms of dysuria/abd pain but again no fever, and no confusion; a repeat urine culture showed mixed growth. She was given Monurol at that time with some improvement of symptoms. As of 02/27/20 she continues to have the dysuria but no fever/confusion/n/v or gross hematuria, or urgency/frequency. She was placed on gemtesa and since then she reports much improvement of her abd pain/dysuria   Per patient, she no longer has these symptoms  She was actually hospitalized first week of December for frequent falls with complaint of the abd pain at that time; again no urgency/frequency. No sepsis diagnosis was made. This was at [facility] long. Ct scan showed bladder diverticuli/thickening and chronic hydronephrosis. Urine cx no growth, and she had no sepsis. Given prior uti, she was started on abx, but only given 3 days meropenem and a dose fosfomycin. A foley was  placed at that time with resolution of the abd pain  Patient said she doesn't like the foley. However, she has no further abd pain since it (or may be the gemtesa initiation) was placed.  She'll have another  apointment with urology 04/10/2020 to discuss this  Again as of this visit No fever, chill, abd pain, n/v/flank pain, diarrhea, rash, dysuria, frequency, urgency, or confusion  Allergies  Allergen Reactions   Adhesive [Tape] Other (See Comments)    Tears patients skin off.    Keflet [Cephalexin] Nausea And Vomiting   Latex Itching   Penicillins Hives and Itching   Sulfa Antibiotics Hives and Itching      Outpatient Medications Prior to Visit  Medication Sig Dispense Refill   acetaminophen (TYLENOL) 325 MG tablet Take 650 mg by mouth every 8 (eight) hours. With hydrocodone     amLODipine (NORVASC) 10 MG tablet Take 1 tablet (10 mg total) by mouth daily. 30 tablet 1   aspirin EC 81 MG tablet Take 81 mg by mouth daily. Swallow whole.     clonazePAM (KLONOPIN) 0.5 MG tablet Take 1 tablet (0.5 mg total) by mouth at bedtime. (Patient taking differently: Take 0.5 mg by mouth 2 (two) times daily. ) 5 tablet 0   diphenhydrAMINE (BANOPHEN) 25 mg capsule Take 25 mg by mouth every 8 (eight) hours as needed for itching.     gabapentin (NEURONTIN) 300 MG capsule Take 1 capsule (300 mg total) by mouth at bedtime.     HYDROcodone-acetaminophen (NORCO/VICODIN) 5-325 MG tablet Take 1 tablet by mouth every 8 (eight) hours.      levETIRAcetam (KEPPRA) 250 MG tablet Take 250 mg by mouth 2 (two) times daily.      linaclotide (LINZESS) 290 MCG CAPS capsule Take 290 mcg by mouth daily.     mirabegron ER (MYRBETRIQ) 25 MG TB24 tablet Take 25 mg by mouth daily.     Multiple Vitamins-Minerals (PRESERVISION AREDS PO) Take 1 capsule by mouth daily.     NONFORMULARY OR COMPOUNDED ITEM Apply 1 application topically in the morning and at bedtime. Anti -itch lotion 0.5-0.5% ( Mix with triamcinolone)     polyethylene glycol (MIRALAX / GLYCOLAX) packet Take 17 g by mouth every other day. As needed for constipation.     Pumpkin Seed-Soy Germ (AZO BLADDER CONTROL/GO-LESS) CAPS Take 1 capsule by mouth  daily.      rosuvastatin (CRESTOR) 10 MG tablet Take 10 mg by mouth daily.      sodium bicarbonate 650 MG tablet Take 1 tablet (650 mg total) by mouth 2 (two) times daily. 60 tablet 1   sodium zirconium cyclosilicate (LOKELMA) 10 g PACK packet Take 10 g by mouth See admin instructions. Takes twice weekly on Monday and Thursday     Suvorexant (BELSOMRA) 10 MG TABS Take 10 mg by mouth at bedtime.     tizanidine (ZANAFLEX) 2 MG capsule Take 2 mg by mouth 2 (two) times daily as needed for muscle spasms.     triamcinolone cream (KENALOG) 0.1 % Apply 1 application topically 2 (two) times daily. Mix with Sarna lotion     Vibegron (GEMTESA) 75 MG TABS Take 75 mg by mouth daily.     No facility-administered medications prior to visit.     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[place])    Essential hypertension, benign    Hypokalemia    Insomnia    Iron deficiency    Macular degeneration    Mixed hyperlipidemia    Obesity    Postsurgical aortocoronary bypass status       Past Surgical History:  Procedure Laterality Date   CATARACT EXTRACTION     CORONARY ARTERY BYPASS GRAFT     IRIDECTOMY     NEPHRECTOMY  2000      Family History  Problem Relation Age of Onset   Colon cancer Mother    Heart attack Father    Diabetes Father    Stroke Other    Coronary artery disease Other    Cancer Other    Diabetes Other       Social History   Socioeconomic History   Marital status: Widowed    Spouse name: Not on file   Number of children: 3   Years of education: Not on file   Highest education level: Not on file  Occupational History    Comment: n/a  Tobacco Use   Smoking status: Never Smoker   Smokeless tobacco: Never  Used  Vaping Use   Vaping Use: Never used  Substance and Sexual Activity   Alcohol use: No   Drug use: No   Sexual activity: Never    Birth control/protection: None  Other Topics Concern   Not on file  Social History Narrative   Patient lives at home with his son and daughter in law.   Caffeine Use: 2 cups of caffeine daily.    Social Determinants of Health   Financial Resource Strain: Not on file  Food Insecurity: Not on file  Transportation Needs: Not on file  Physical Activity: Not on file  Stress: Not on file  Social Connections: Not on file  Intimate Partner Violence: Not on file      Review of Systems   all other ros were negative  Objective:    Temp 98 F (36.7 C)    Ht 5\' 2"  (1.575 m)    Wt 163 lb (73.9 kg)    BMI 29.81 kg/m  Nursing note and vital signs reviewed.  Physical Exam Patient accompanied by her sister; she is in a wheel chair General: no distress, pleasant, conversant, cooperative Heent: atraumatic, per, conj clear, eomi Neck supple cv rrr no mrg Lungs clear; normal respiratory effort abd s/nt Ext trace edema bilaterally Gu: foley intact; urine collection bag with clear yellow urine; no cva tenderness Skin no rash Neuro: cn2-12 intact; strength symmetric 4-5/5 bilateral LE Psych alert/oriented    Labs: 12/7 cr 3.3 12/6 wbc 7.4  Micro: 12/06 ucx no growth 12/05 bcx no growth 10/14 ucx >100k ecoli (s gent, imipenem, piptazo;R bactrim, cipro, ceftriaxone, cefazolin)  Serology:  Imaging: 03/12/2020 abd ct I reviewed ct which showed chronic bladder wall thickening, left sided hydronephrosis like outside system report, and also history of right nephrectomy   Assessment & Plan:   Patient Active Problem List   Diagnosis Date Noted   Recurrent UTI 03/11/2020   Exudative age-related macular degeneration of right eye with inactive choroidal neovascularization ([place]) 12/20/2019   Degenerative retinal drusen of both eyes  12/20/2019   Exudative age-related macular degeneration of left eye with inactive choroidal neovascularization ([place]) 12/20/2019   Traumatic subdural hematoma ([place]) 08/08/2018   Pyelonephritis 09/09/2016  Falls frequently 06/24/2016   Tension headache 11/10/2013   Chest pain 11/03/2011   ARF (acute renal failure) ([place]) 04/30/2011   UTI (urinary tract infection) 04/30/2011   CKD (chronic kidney disease), stage IV ([place]) 04/30/2011   Anemia 04/30/2011   S/p nephrectomy 04/30/2011   Macular degeneration 04/30/2011   Cataract 04/30/2011   Bladder cancer ([place]) 04/30/2011   Weakness generalized 04/29/2011   Seizure disorder ([place]) 04/29/2011   Benign hypertension 04/29/2011   CAD (coronary artery disease) 04/29/2011   Hyperlipidemia 04/29/2011   Anxiety 04/29/2011   Dizziness - light-headed 04/29/2011   Chronic abdominal pain 04/29/2011   Mixed hyperlipidemia 05/24/2008   Essential hypertension, benign 05/24/2008   Postsurgical aortocoronary bypass status 05/24/2008     Problem List Items Addressed This Visit   None   Visit Diagnoses    History of bladder cancer    -  Primary   ESBL E. coli carrier       Hydroureteronephrosis       Bladder outlet obstruction         86 yo female hx bladder cancer s/p right nephrectomy, ckd 4, bladder obstruction/incontinence referred by urology for evaluation of hx of uti and esbl organism in culture  She has had several trials of abx previously, and while some cases not appropriate abx selection, it doesn't appear to be the case of uti causing chronic abd discomfort/dysuria. Her symptoms appear to have resolved with a combination of gemtesa for her bladder hyperactivity and foley catheter placement for what appears to be chronic bladder outlet obstruction with hydroureteronephrosis complication.  We discussed not treating assym[ptomatic bacteriuria  We discussed also while the foley catheter predispose her to infection  as well, it appears in her case possibly the lesser of 2 evils this time given her only having a solitary kidney and bladder outlet obstruction which could make ckd worse and more pressing. Also with the chronic abd pain being better with it in place as well.   -no abx indicated at this time -follow up with urology to discuss foley catheter management -follow up with id clinic if fever/chill, flank pain, confusion, urgency/frequency/dysuria   I am having Yuval J. Glahn maintain her rosuvastatin, Multiple Vitamins-Minerals (PRESERVISION AREDS PO), polyethylene glycol, AZO Bladder Control/Go-Less, gabapentin, clonazePAM, levETIRAcetam, acetaminophen, aspirin EC, HYDROcodone-acetaminophen, linaclotide, Lokelma, mirabegron ER, triamcinolone, tizanidine, NONFORMULARY OR COMPOUNDED ITEM, Belsomra, diphenhydrAMINE, Gemtesa, amLODipine, and sodium bicarbonate.   I spend 60 minutes reviewing chart, and provide counseling/education for patient  Follow-up: No follow-ups on file.      Jabier Mutton, [facility] for Infectious Disease [place] -- -- pager   [phone] cell 03/28/2020, 2:43 PM

## 2020-04-25 ENCOUNTER — Emergency Department (HOSPITAL_COMMUNITY)
Admission: EM | Admit: 2020-04-25 | Discharge: 2020-04-26 | Disposition: A | Discharge status: Home / Self Care | Payer: Medicare Other | Attending: Emergency Medicine | Admitting: Emergency Medicine

## 2020-04-25 ENCOUNTER — Emergency Department (HOSPITAL_COMMUNITY): Payer: Medicare Other

## 2020-04-25 ENCOUNTER — Other Ambulatory Visit: Payer: Self-pay

## 2020-04-25 ENCOUNTER — Encounter (HOSPITAL_COMMUNITY): Payer: Self-pay

## 2020-04-25 DIAGNOSIS — Z7982 Long term (current) use of aspirin: Secondary | ICD-10-CM | POA: Insufficient documentation

## 2020-04-25 DIAGNOSIS — U071 COVID-19: Secondary | ICD-10-CM | POA: Insufficient documentation

## 2020-04-25 DIAGNOSIS — I2510 Atherosclerotic heart disease of native coronary artery without angina pectoris: Secondary | ICD-10-CM | POA: Insufficient documentation

## 2020-04-25 DIAGNOSIS — N184 Chronic kidney disease, stage 4 (severe): Secondary | ICD-10-CM | POA: Insufficient documentation

## 2020-04-25 DIAGNOSIS — Z79899 Other long term (current) drug therapy: Secondary | ICD-10-CM | POA: Diagnosis not present

## 2020-04-25 DIAGNOSIS — Z8551 Personal history of malignant neoplasm of bladder: Secondary | ICD-10-CM | POA: Diagnosis not present

## 2020-04-25 DIAGNOSIS — I1310 Hypertensive heart and chronic kidney disease without heart failure, with stage 1 through stage 4 chronic kidney disease, or unspecified chronic kidney disease: Secondary | ICD-10-CM | POA: Insufficient documentation

## 2020-04-25 DIAGNOSIS — Z960 Presence of urogenital implants: Secondary | ICD-10-CM | POA: Insufficient documentation

## 2020-04-25 DIAGNOSIS — Z91040 Latex allergy status: Secondary | ICD-10-CM | POA: Insufficient documentation

## 2020-04-25 DIAGNOSIS — R109 Unspecified abdominal pain: Secondary | ICD-10-CM | POA: Diagnosis present

## 2020-04-25 DIAGNOSIS — Z955 Presence of coronary angioplasty implant and graft: Secondary | ICD-10-CM | POA: Insufficient documentation

## 2020-04-25 LAB — URINALYSIS, MICROSCOPIC (REFLEX)

## 2020-04-25 LAB — COMPREHENSIVE METABOLIC PANEL
ALT: 10 U/L (ref 0–44)
AST: 20 U/L (ref 15–41)
Albumin: 3.3 g/dL — ABNORMAL LOW (ref 3.5–5.0)
Alkaline Phosphatase: 77 U/L (ref 38–126)
Anion gap: 11 (ref 5–15)
BUN: 29 mg/dL — ABNORMAL HIGH (ref 8–23)
CO2: 24 mmol/L (ref 22–32)
Calcium: 8.9 mg/dL (ref 8.9–10.3)
Chloride: 104 mmol/L (ref 98–111)
Creatinine, Ser: 2.56 mg/dL — ABNORMAL HIGH (ref 0.44–1.00)
GFR, Estimated: 17 mL/min — ABNORMAL LOW (ref >60)
Glucose, Bld: 108 mg/dL — ABNORMAL HIGH (ref 70–99)
Potassium: 4.4 mmol/L (ref 3.5–5.1)
Sodium: 139 mmol/L (ref 135–145)
Total Bilirubin: 0.8 mg/dL (ref 0.3–1.2)
Total Protein: 6.8 g/dL (ref 6.5–8.1)

## 2020-04-25 LAB — LACTIC ACID, PLASMA: Lactic Acid, Venous: 0.9 mmol/L (ref 0.5–1.9)

## 2020-04-25 LAB — URINALYSIS, ROUTINE W REFLEX MICROSCOPIC
Bilirubin Urine: NEGATIVE
Glucose, UA: NEGATIVE mg/dL
Ketones, ur: NEGATIVE mg/dL
Nitrite: POSITIVE — AB
Protein, ur: 100 mg/dL — AB
Specific Gravity, Urine: 1.02 (ref 1.005–1.030)
pH: 7 (ref 5.0–8.0)

## 2020-04-25 LAB — CBC
HCT: 37 % (ref 36.0–46.0)
Hemoglobin: 11.3 g/dL — ABNORMAL LOW (ref 12.0–15.0)
MCH: 28.3 pg (ref 26.0–34.0)
MCHC: 30.5 g/dL (ref 30.0–36.0)
MCV: 92.7 fL (ref 80.0–100.0)
Platelets: 150 10*3/uL (ref 150–400)
RBC: 3.99 MIL/uL (ref 3.87–5.11)
RDW: 14.5 % (ref 11.5–15.5)
WBC: 6.7 10*3/uL (ref 4.0–10.5)
nRBC: 0 % (ref 0.0–0.2)

## 2020-04-25 LAB — RESP PANEL BY RT-PCR (FLU A&B, COVID) ARPGX2
Influenza A by PCR: NEGATIVE
Influenza B by PCR: NEGATIVE
SARS Coronavirus 2 by RT PCR: POSITIVE — AB

## 2020-04-25 LAB — LIPASE, BLOOD: Lipase: 41 U/L (ref 11–51)

## 2020-04-25 MED ORDER — ONDANSETRON HCL 4 MG/2ML IJ SOLN
4.0000 mg | Freq: Once | INTRAMUSCULAR | Status: AC
  Administered 2020-04-25: 4 mg via INTRAVENOUS
  Filled 2020-04-25: qty 2

## 2020-04-25 MED ORDER — MORPHINE SULFATE (PF) 2 MG/ML IV SOLN
2.0000 mg | Freq: Once | INTRAVENOUS | Status: AC
  Administered 2020-04-25: 2 mg via INTRAVENOUS
  Filled 2020-04-25: qty 1

## 2020-04-25 MED ORDER — SODIUM CHLORIDE 0.9 % IV BOLUS
500.0000 mL | Freq: Once | INTRAVENOUS | Status: AC
  Administered 2020-04-25: 500 mL via INTRAVENOUS

## 2020-04-25 MED ORDER — MORPHINE SULFATE (PF) 4 MG/ML IV SOLN
4.0000 mg | Freq: Once | INTRAVENOUS | Status: AC
  Administered 2020-04-25: 4 mg via INTRAVENOUS
  Filled 2020-04-25: qty 1

## 2020-04-25 NOTE — ED Notes (Signed)
Pt aao4, gcs15, continues to pull off leads despite education. VSS at this time. Side rails up, call bell in reach.

## 2020-04-25 NOTE — ED Triage Notes (Signed)
Pt from [facility] [place] with ems c.o right sided abd pain. Pt WC bound, indwelling foley in place. Pt alert

## 2020-04-25 NOTE — ED Notes (Signed)
PTAR called @ 1806-per Brayton Layman, RN called by Levada Dy

## 2020-04-25 NOTE — ED Notes (Signed)
Pts sister updated at this time.

## 2020-04-25 NOTE — Discharge Instructions (Addendum)
[name] [name] was evaluated today in the emergency department for her right upper quadrant belly pain.  Her vital signs, blood work, and imaging studies were very reassuring.  There is some distention of her gallbladder, without signs of infection or gallstones that are obstructing the drainage of her gallbladder at this time.  Recommend she follow-up with her primary care doctor and/or general surgeon listed below for reevaluation of her right upper quadrant pain and possible further imaging studies.  Additionally she should follow-up with her primary care doctor within the next 48 to 72 hours for follow-up on her urine culture and determination if she needs antibiotic therapy for UTI.  She has tested positive for COVID-19 at this time.  Recommend she isolate for the next 7 days in order to protect those around her.  Return to the emergency department if she develops any chest pain, difficulty breathing, nausea or vomiting that does not stop, or any other new severe symptoms.

## 2020-04-25 NOTE — ED Provider Notes (Signed)
[facility] EMERGENCY DEPARTMENT Provider Note   CSN: [id number] Arrival date & time: 04/25/20  2094     History Chief Complaint  Patient presents with  . Abdominal Pain    [name] [name] is a 87 y.o. female with extensive medical history who presents from her facility for concern of right-sided abdominal pain.  Patient is wheelchair-bound at baseline and has indwelling Foley catheter.  History provided by patient, though she is difficult to understand when she speaks, a poor historian.  She endorses worsening right-sided abdominal pain.  States that it is sore intermittently at baseline, but has been worse over the last few days.  Endorses recent admission for UTI.  According to her facility paperwork she is not on any antibiotics outpatient.  She denies any chest pain, shortness of breath, palpitations, nausea, vomiting, diarrhea.  Denies fevers or chills at home.  I personally reviewed this patient's medical records.  She has history of hyperlipidemia, hypertension, bladder cancer, obesity, coronary artery disease, macular degeneration, seizure disorder.  Patient states she was informed she needs to be on dialysis, however she declined hemodialysis treatment. Patient recently admitted to the hospital in 03/2021 for chronic cystitis.  HPI     Past Medical History:  Diagnosis Date  . Abnormal gait   . Asthenia   . Back pain   . Bladder cancer ([place])   . CAD (coronary artery disease)   . Chronic pain   . CKD (chronic kidney disease), stage IV ([place])   . Dizziness   . Dysphagia   . 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 Essential hypertension, benign   . Hypokalemia   . Insomnia   . Iron deficiency   . Macular degeneration   . Mixed hyperlipidemia   . Obesity   . Postsurgical aortocoronary bypass status     Patient Active Problem List   Diagnosis Date Noted  . Recurrent UTI 03/11/2020  . Exudative age-related macular degeneration of right eye with  inactive choroidal neovascularization ([place]) 12/20/2019  . Degenerative retinal drusen of both eyes 12/20/2019  . Exudative age-related macular degeneration of left eye with inactive choroidal neovascularization ([place]) 12/20/2019  . Traumatic subdural hematoma ([place]) 08/08/2018  . Pyelonephritis 09/09/2016  . Falls frequently 06/24/2016  . Tension headache 11/10/2013  . Chest pain 11/03/2011  . ARF (acute renal failure) ([place]) 04/30/2011  . UTI (urinary tract infection) 04/30/2011  . CKD (chronic kidney disease), stage IV ([place]) 04/30/2011  . Anemia 04/30/2011  . S/p nephrectomy 04/30/2011  . Macular degeneration 04/30/2011  . Cataract 04/30/2011  . Bladder cancer ([place]) 04/30/2011  . Weakness generalized 04/29/2011  . Seizure disorder ([place]) 04/29/2011  . Benign hypertension 04/29/2011  . CAD (coronary artery disease) 04/29/2011  . Hyperlipidemia 04/29/2011  . Anxiety 04/29/2011  . Dizziness - light-headed 04/29/2011  . Chronic abdominal pain 04/29/2011  . Mixed hyperlipidemia 05/24/2008  . Essential hypertension, benign 05/24/2008  . Postsurgical aortocoronary bypass status 05/24/2008    Past Surgical History:  Procedure Laterality Date  . CATARACT EXTRACTION    . CORONARY ARTERY BYPASS GRAFT    . IRIDECTOMY    . NEPHRECTOMY  2000     OB History    Gravida  3   Para  3   Term  3   Preterm      AB      Living  3     SAB      IAB      Ectopic  Multiple      Live Births              Family History  Problem Relation Age of Onset  . Colon cancer Mother   . Heart attack Father   . Diabetes Father   . Stroke Other   . Coronary artery disease Other   . Cancer Other   . Diabetes Other     Social History   Tobacco Use  . Smoking status: Never Smoker  . Smokeless tobacco: Never Used  Vaping Use  . Vaping Use: Never used  Substance Use Topics  . Alcohol use: No  . Drug use: No    Home Medications Prior to Admission medications   Medication  Sig Start Date End Date Taking? Authorizing Provider  acetaminophen (TYLENOL) 325 MG tablet Take 650 mg by mouth every 8 (eight) hours. With hydrocodone    [provider]  amLODipine (NORVASC) 10 MG tablet Take 1 tablet (10 mg total) by mouth daily. 03/14/20   Shelly Coss, MD  aspirin EC 81 MG tablet Take 81 mg by mouth daily. Swallow whole.    [provider]  clonazePAM (KLONOPIN) 0.5 MG tablet Take 1 tablet (0.5 mg total) by mouth at bedtime. Patient taking differently: Take 0.5 mg by mouth 2 (two) times daily.  08/10/18   Domenic Polite, MD  diphenhydrAMINE [facility]) 25 mg capsule Take 25 mg by mouth every 8 (eight) hours as needed for itching.    [provider]  gabapentin (NEURONTIN) 300 MG capsule Take 1 capsule (300 mg total) by mouth at bedtime. 08/10/18   Domenic Polite, MD  HYDROcodone-acetaminophen (NORCO/VICODIN) 5-325 MG tablet Take 1 tablet by mouth every 8 (eight) hours.     [provider]  levETIRAcetam (KEPPRA) 250 MG tablet Take 250 mg by mouth 2 (two) times daily.     [provider]  linaclotide (LINZESS) 290 MCG CAPS capsule Take 290 mcg by mouth daily.    [provider]  mirabegron ER (MYRBETRIQ) 25 MG TB24 tablet Take 25 mg by mouth daily.    [provider]  Multiple Vitamins-Minerals (PRESERVISION AREDS PO) Take 1 capsule by mouth daily.    [provider]  NONFORMULARY OR COMPOUNDED ITEM Apply 1 application topically in the morning and at bedtime. Anti -itch lotion 0.5-0.5% ( Mix with triamcinolone)    [provider]  polyethylene glycol (MIRALAX / GLYCOLAX) packet Take 17 g by mouth every other day. As needed for constipation.    [provider]  Pumpkin Seed-Soy Germ (AZO BLADDER CONTROL/GO-LESS) CAPS Take 1 capsule by mouth daily.     [provider]  rosuvastatin (CRESTOR) 10 MG tablet Take 10 mg by mouth daily.  01/06/11   [provider]  sodium  bicarbonate 650 MG tablet Take 1 tablet (650 mg total) by mouth 2 (two) times daily. 03/13/20   Shelly Coss, MD  sodium zirconium cyclosilicate (LOKELMA) 10 g PACK packet Take 10 g by mouth See admin instructions. Takes twice weekly on Monday and Thursday    [provider]  Suvorexant (BELSOMRA) 10 MG TABS Take 10 mg by mouth at bedtime.    [provider]  tizanidine (ZANAFLEX) 2 MG capsule Take 2 mg by mouth 2 (two) times daily as needed for muscle spasms.    [provider]  triamcinolone cream (KENALOG) 0.1 % Apply 1 application topically 2 (two) times daily. Mix with Sarna lotion    [provider]  Vibegron (  GEMTESA) 75 MG TABS Take 75 mg by mouth daily.    [provider]    Allergies    Adhesive [tape], Keflet [cephalexin], Latex, Penicillins, and Sulfa antibiotics  Review of Systems   Review of Systems  Constitutional: Negative.   HENT: Negative.   Eyes: Negative.   Respiratory: Negative.   Cardiovascular: Negative.   Gastrointestinal: Positive for abdominal pain. Negative for nausea and vomiting.  Genitourinary:       Chronic foley catheter.   Musculoskeletal: Negative.   Skin: Negative.   Neurological: Positive for weakness.    Physical Exam Updated Vital Signs BP (!) 166/60   Pulse 66   Temp 99.1 F (37.3 C) (Oral)   Resp 16   SpO2 98%   Physical Exam Vitals and nursing note reviewed.  Constitutional:      Appearance: She is obese.  HENT:     Head: Normocephalic and atraumatic.     Nose: Nose normal.     Mouth/Throat:     Mouth: Mucous membranes are moist.     Pharynx: Oropharynx is clear. Uvula midline. No oropharyngeal exudate or posterior oropharyngeal erythema.     Tonsils: No tonsillar exudate.  Eyes:     General:        Right eye: No discharge.        Left eye: No discharge.     Conjunctiva/sclera: Conjunctivae normal.     Pupils: Pupils are equal, round, and reactive to light.     Comments: EOMs  not assessed due to impaired vision from macular degeneration.  Neck:     Trachea: Trachea and phonation normal.  Cardiovascular:     Rate and Rhythm: Normal rate and regular rhythm.     Pulses: Normal pulses.          Radial pulses are 2+ on the right side and 2+ on the left side.       Dorsalis pedis pulses are 2+ on the right side and 2+ on the left side.     Heart sounds: Murmur heard.   Systolic murmur is present with a grade of 3/6.   Pulmonary:     Effort: Pulmonary effort is normal. No respiratory distress.     Breath sounds: Examination of the right-lower field reveals decreased breath sounds. Examination of the left-lower field reveals decreased breath sounds and rales. Decreased breath sounds and rales present. No wheezing.  Chest:     Chest wall: No swelling, tenderness, crepitus or edema.  Abdominal:     General: Bowel sounds are normal. There is no distension.     Palpations: Abdomen is soft.     Tenderness: There is abdominal tenderness in the right upper quadrant and epigastric area. There is no right CVA tenderness, left CVA tenderness, guarding or rebound.  Genitourinary:    Comments: Foley catheter, yellow urine in the bag Musculoskeletal:        General: No deformity.     Cervical back: Neck supple. No crepitus. No pain with movement, spinous process tenderness or muscular tenderness.     Right lower leg: 1+ Edema present.     Left lower leg: 1+ Edema present.     Comments: 4/5 strength bilaterally in plantar and dorsiflexion.  4/5 grip strength bilaterally  Lymphadenopathy:     Cervical: No cervical adenopathy.  Skin:    General: Skin is warm and dry.     Capillary Refill: Capillary refill takes less than 2 seconds.  Neurological:  Mental Status: She is alert. Mental status is at baseline.     Sensory: Sensation is intact.     Motor: Motor function is intact.  Psychiatric:        Mood and Affect: Mood normal.     ED Results / Procedures / Treatments    Labs (all labs ordered are listed, but only abnormal results are displayed) Labs Reviewed  COMPREHENSIVE METABOLIC PANEL - Abnormal; Notable for the following components:      Result Value   Glucose, Bld 108 (*)    BUN 29 (*)    Creatinine, Ser 2.56 (*)    Albumin 3.3 (*)    GFR, Estimated 17 (*)    All other components within normal limits  CBC - Abnormal; Notable for the following components:   Hemoglobin 11.3 (*)    All other components within normal limits  URINALYSIS, ROUTINE W REFLEX MICROSCOPIC - Abnormal; Notable for the following components:   APPearance CLOUDY (*)    Hgb urine dipstick TRACE (*)    Protein, ur 100 (*)    Nitrite POSITIVE (*)    Leukocytes,Ua MODERATE (*)    All other components within normal limits  URINALYSIS, MICROSCOPIC (REFLEX) - Abnormal; Notable for the following components:   Bacteria, UA MANY (*)    All other components within normal limits  URINE CULTURE  CULTURE, BLOOD (ROUTINE X 2)  CULTURE, BLOOD (ROUTINE X 2)  LIPASE, BLOOD  LACTIC ACID, PLASMA  LACTIC ACID, PLASMA    EKG None  Radiology CT Abdomen Pelvis Wo Contrast  Result Date: 04/25/2020 CLINICAL DATA:  Right-sided abdominal pain. EXAM: CT ABDOMEN AND PELVIS WITHOUT CONTRAST TECHNIQUE: Multidetector CT imaging of the abdomen and pelvis was performed following the standard protocol without IV contrast. COMPARISON:  CT abdomen pelvis dated March 11, 2020. FINDINGS: Lower chest: New small left pleural effusion with increased mild left lower lobe atelectasis. Hepatobiliary: No focal liver abnormality is seen. Progressive gallbladder distension. No gallstones or gallbladder wall thickening. Chronic mild common bile duct dilatation measuring 10 mm, unchanged. Pancreas: Mildly atrophic. No ductal dilatation or surrounding inflammatory changes. Spleen: Normal in size without focal abnormality. Adrenals/Urinary Tract: The adrenal glands are unremarkable. Prior right nephrectomy. Unchanged  mildly atrophic left kidney with small cysts. No renal calculi or hydronephrosis. The bladder is decompressed by a Foley catheter. Stomach/Bowel: Stomach is within normal limits. Appendix is not visualized but there are no signs of inflammation at the base of the cecum. No evidence of bowel wall thickening, distention, or inflammatory changes. Sigmoid colonic diverticulosis. Vascular/Lymphatic: Aortic atherosclerosis. No enlarged abdominal or pelvic lymph nodes. Reproductive: Status post hysterectomy. Unchanged 3.1 cm simple-appearing cyst in the left ovary, stable since 2018. Other: Unchanged tiny fat containing umbilical hernia. No free fluid or pneumoperitoneum. Musculoskeletal: No acute or significant osseous findings. Old left sacral ala and bilateral pubic rami fractures. Old L1 compression deformity status post cement augmentation. IMPRESSION: 1. Progressive gallbladder distension without gallstones or gallbladder wall thickening. Chronic mild common bile duct dilatation. Given right-sided abdominal pain, further evaluation with right upper quadrant ultrasound is recommended. 2. New small left pleural effusion with increased mild left lower lobe atelectasis. 3. Aortic Atherosclerosis (ICD10-I70.0). Electronically Signed   By: Titus Dubin M.D.   On: 04/25/2020 11:55   DG Chest Portable 1 View  Result Date: 04/25/2020 CLINICAL DATA:  Shortness of breath.  Abdominal pain. EXAM: PORTABLE CHEST 1 VIEW COMPARISON:  CT 10/10/2019.  Chest x-ray 10/10/2019. FINDINGS: Prior median sternotomy. Stable cardiomegaly.  No focal infiltrate. Mild bibasilar atelectasis. No pleural effusion or pneumothorax. Stable left-sided pleural thickening consistent scarring. No acute bony abnormality. Old right rib fractures are again noted. Degenerative changes both shoulders. Degenerative changes thoracic spine. IMPRESSION: 1. Prior median sternotomy. Stable cardiomegaly. No pulmonary venous congestion. 2.  Mild bibasilar  atelectasis. Electronically Signed   By: Marcello Moores  Register   On: 04/25/2020 12:30    Procedures Procedures (including critical care time)  Medications Ordered in ED Medications  sodium chloride 0.9 % bolus 500 mL (500 mLs Intravenous New Bag/Given 04/25/20 1110)  morphine 2 MG/ML injection 2 mg (2 mg Intravenous Given 04/25/20 1110)    ED Course  I have reviewed the triage vital signs and the nursing notes.  Pertinent labs & imaging results that were available during my care of the patient were reviewed by me and considered in my medical decision making (see chart for details).    MDM Rules/Calculators/A&P                          87 year old female with history of urosepsis who presents with concern for right upper quadrant pain.  The differential diagnosis for RUQ includes but is not limited to:  Cholelithiasis / choledocholithiasis / cholecystitis / cholangitis, hepatitis (eg. viral, alcoholic, toxic),liver abscess, pancreatitis, liver / pancreatic / biliary tract cancer, ischemic hepatopathy (shock liver), hepatic vein obstruction (Budd-Chiari syndrome), liver cell adenoma, peptic ulcer disease (duodenal), functional or nonulcer dyspepsia, right lower lobe pneumonia, pyelonephritis, urinary calculi, herpes zoster, trauma or musculoskeletal pain, herniated disk, abdominal abscess, intestinal ischemia, physical or sexual abuse, endometriosis,appendicitis, UTI/renal colic, IBD.   Patient hypertensive on intake to 169/70.  Vital signs otherwise normal.  Patient is borderline febrile with temperature 99.1 F.  Physical exam significant for generalized abdominal tenderness to palpation, most significant in the right upper quadrant.  We will proceed with basic laboratory studies, analgesia offered.  Proceed with CT of the abdomen and pelvis.  CBC mild anemia, with hemoglobin 11.3.  CMP with elevated BUN/creatinine however, at patient baseline.  (Patient referred for hemodialysis, however declined  treatment.).  UA positive for nitrates with moderate leukocytes, patient baseline given indwelling Foley catheter, chronic cystitis.  Hold off on antibiotics at this time, pending urine culture. Lipase normal, 41. Lactic acid normal.  CT abdomen pelvis with progressively distended gallbladder without gallstones or gallbladder wall thickening.  Chronic mild CBD dilatation.  Right upper quadrant ultrasound obtained for further evaluation.  This revealed moderately distended gallbladder with some layering echogenic sludge without definitive gallstones.  CBD within normal limits given patient's age.  Patient reevaluated continues and is feeling much better after second dose of pain medication. Additionally, she is COVID+.   Given reassuring vital signs, physical exam, laboratory, and imaging studies, no further work-up is warranted in the emergency department at this time.  Suspect patient's COVID-19 positive status is a contributing to her abdominal pain.  Will discharge with instructions to follow-up with general surgeon regarding possible biliary colic; may need HIDA scan.  [place] voiced understanding of her medical evaluation and treatment plan.  Each of her questions were answered to her expressed satisfaction.  Return precautions given.  Patient is stable and appropriate for discharge at this time.  CHIANNA SPIRITO was evaluated in Emergency Department on 04/25/2020 for the symptoms described in the history of present illness. She was evaluated in the context of the global COVID-19 pandemic, which necessitated consideration that the patient might be at  risk for infection with the SARS-CoV-2 virus that causes COVID-19. Institutional protocols and algorithms that pertain to the evaluation of patients at risk for COVID-19 are in a state of rapid change based on information released by regulatory bodies including the CDC and federal and state organizations. These policies and algorithms were followed during the  patient's care in the ED.  This chart was dictated using voice recognition software, Dragon. Despite the best efforts of this provider to proofread and correct errors, errors may still occur which can change documentation meaning.   Final Clinical Impression(s) / ED Diagnoses Final diagnoses:  None    Rx / DC Orders ED Discharge Orders    None       Emeline Darling, PA-C 04/25/20 1842    Elnora Morrison, MD 04/27/20 1615

## 2020-04-25 NOTE — ED Notes (Signed)
pts sister updated at this time 

## 2020-04-25 NOTE — ED Notes (Signed)
md zavitz notified pts covid +

## 2020-04-25 NOTE — ED Provider Notes (Incomplete)
Shared service with APP.  I have personally seen and examined the patient, providing direct face to face care.  Physical exam findings and plan include patient presents with intermittent abdominal pain worse right mid upper area.  On exam mild tenderness, no guarding, chronic Foley in place.  Blood pressure mild elevated, normal heart rate.  Discussed differential diagnosis with patient, CT scan showed large gallbladder plan for formal ultrasound, pain meds as needed.  Patient has no confusion or fever.  No diagnosis found.

## 2020-04-26 ENCOUNTER — Telehealth: Payer: Self-pay

## 2020-04-26 NOTE — Telephone Encounter (Signed)
Called to discuss with patient about COVID-19 symptoms and the use of one of the available treatments for those with mild to moderate Covid symptoms and at a high risk of hospitalization.  Pt appears to qualify for outpatient treatment due to co-morbid conditions and/or a member of an at-risk group in accordance with the FDA Emergency Use Authorization.    Symptom onset: 04/25/20 Vaccinated: No per sister Booster? No per sister Immunocompromised? No Qualifiers: HTN,CKD,CAD  Unable to reach pt - Spoke with sister and pt. Had no other symptom than abdominal pain.    Marcello Moores

## 2020-04-28 LAB — URINE CULTURE: Culture: >=100000 — AB

## 2020-04-29 ENCOUNTER — Telehealth: Payer: Self-pay | Admitting: Emergency Medicine

## 2020-04-29 NOTE — Telephone Encounter (Signed)
Post ED Visit - Positive Culture Follow-up  Culture report reviewed by antimicrobial stewardship pharmacist: [place] Team []  Elenor Quinones, Pharm.D. []  Heide Guile, Pharm.D., BCPS AQ-ID []  [place] Neptune, Pharm.D., BCPS []  Alycia Rossetti, Pharm.D., BCPS []  [place], Pharm.D., BCPS, AAHIVP []  Legrand Como, Pharm.D., BCPS, AAHIVP []  Salome Arnt, PharmD, BCPS []  Johnnette Gourd, PharmD, BCPS []  Hughes Better, PharmD, BCPS [x]  Bertis Ruddy, PharmD []  Laqueta Linden, PharmD, BCPS []  Albertina Parr, PharmD  [place] Team []  Leodis Sias, PharmD []  Lindell Spar, PharmD []  Royetta Asal, PharmD []  Graylin Shiver, Rph []  Rema Fendt) Glennon Mac, PharmD []  Arlyn Dunning, PharmD []  Netta Cedars, PharmD []  Dia Sitter, PharmD []  Leone Haven, PharmD []  Gretta Arab, PharmD []  Theodis Shove, PharmD []  Peggyann Juba, PharmD []  Reuel Boom, PharmD   Positive urine culture No further patient follow-up is required at this time.  Milus Mallick 04/29/2020, 5:21 PM

## 2020-04-30 LAB — CULTURE, BLOOD (ROUTINE X 2)
Culture: NO GROWTH
Special Requests: ADEQUATE

## 2020-05-06 ENCOUNTER — Other Ambulatory Visit: Payer: Self-pay

## 2020-05-06 ENCOUNTER — Emergency Department (HOSPITAL_COMMUNITY)
Admission: EM | Admit: 2020-05-06 | Discharge: 2020-05-06 | Disposition: A | Discharge status: Home / Self Care | Payer: Medicare Other | Attending: Emergency Medicine | Admitting: Emergency Medicine

## 2020-05-06 ENCOUNTER — Emergency Department (HOSPITAL_COMMUNITY): Payer: Medicare Other

## 2020-05-06 DIAGNOSIS — Z79899 Other long term (current) drug therapy: Secondary | ICD-10-CM | POA: Insufficient documentation

## 2020-05-06 DIAGNOSIS — R1011 Right upper quadrant pain: Secondary | ICD-10-CM

## 2020-05-06 DIAGNOSIS — B9689 Other specified bacterial agents as the cause of diseases classified elsewhere: Secondary | ICD-10-CM | POA: Insufficient documentation

## 2020-05-06 DIAGNOSIS — N184 Chronic kidney disease, stage 4 (severe): Secondary | ICD-10-CM | POA: Diagnosis not present

## 2020-05-06 DIAGNOSIS — N390 Urinary tract infection, site not specified: Secondary | ICD-10-CM | POA: Diagnosis not present

## 2020-05-06 DIAGNOSIS — Z7982 Long term (current) use of aspirin: Secondary | ICD-10-CM | POA: Diagnosis not present

## 2020-05-06 DIAGNOSIS — I2510 Atherosclerotic heart disease of native coronary artery without angina pectoris: Secondary | ICD-10-CM | POA: Diagnosis not present

## 2020-05-06 DIAGNOSIS — Y732 Prosthetic and other implants, materials and accessory gastroenterology and urology devices associated with adverse incidents: Secondary | ICD-10-CM | POA: Insufficient documentation

## 2020-05-06 DIAGNOSIS — Z91040 Latex allergy status: Secondary | ICD-10-CM | POA: Diagnosis not present

## 2020-05-06 DIAGNOSIS — R079 Chest pain, unspecified: Secondary | ICD-10-CM | POA: Insufficient documentation

## 2020-05-06 DIAGNOSIS — I129 Hypertensive chronic kidney disease with stage 1 through stage 4 chronic kidney disease, or unspecified chronic kidney disease: Secondary | ICD-10-CM | POA: Diagnosis not present

## 2020-05-06 DIAGNOSIS — T83511A Infection and inflammatory reaction due to indwelling urethral catheter, initial encounter: Secondary | ICD-10-CM | POA: Insufficient documentation

## 2020-05-06 DIAGNOSIS — Z951 Presence of aortocoronary bypass graft: Secondary | ICD-10-CM | POA: Diagnosis not present

## 2020-05-06 LAB — LIPASE, BLOOD: Lipase: 70 U/L — ABNORMAL HIGH (ref 11–51)

## 2020-05-06 LAB — COMPREHENSIVE METABOLIC PANEL
ALT: 12 U/L (ref 0–44)
AST: 23 U/L (ref 15–41)
Albumin: 4.1 g/dL (ref 3.5–5.0)
Alkaline Phosphatase: 74 U/L (ref 38–126)
Anion gap: 12 (ref 5–15)
BUN: 38 mg/dL — ABNORMAL HIGH (ref 8–23)
CO2: 27 mmol/L (ref 22–32)
Calcium: 9.5 mg/dL (ref 8.9–10.3)
Chloride: 104 mmol/L (ref 98–111)
Creatinine, Ser: 2.63 mg/dL — ABNORMAL HIGH (ref 0.44–1.00)
GFR, Estimated: 17 mL/min — ABNORMAL LOW (ref >60)
Glucose, Bld: 117 mg/dL — ABNORMAL HIGH (ref 70–99)
Potassium: 4.1 mmol/L (ref 3.5–5.1)
Sodium: 143 mmol/L (ref 135–145)
Total Bilirubin: 0.7 mg/dL (ref 0.3–1.2)
Total Protein: 7.8 g/dL (ref 6.5–8.1)

## 2020-05-06 LAB — URINALYSIS, ROUTINE W REFLEX MICROSCOPIC
Bilirubin Urine: NEGATIVE
Glucose, UA: NEGATIVE mg/dL
Ketones, ur: 5 mg/dL — AB
Nitrite: NEGATIVE
Protein, ur: 100 mg/dL — AB
RBC / HPF: >50 RBC/hpf — ABNORMAL HIGH (ref 0–5)
Specific Gravity, Urine: 1.012 (ref 1.005–1.030)
pH: 7 (ref 5.0–8.0)

## 2020-05-06 LAB — CBC
HCT: 41.2 % (ref 36.0–46.0)
Hemoglobin: 12.8 g/dL (ref 12.0–15.0)
MCH: 28.5 pg (ref 26.0–34.0)
MCHC: 31.1 g/dL (ref 30.0–36.0)
MCV: 91.8 fL (ref 80.0–100.0)
Platelets: 224 10*3/uL (ref 150–400)
RBC: 4.49 MIL/uL (ref 3.87–5.11)
RDW: 14.4 % (ref 11.5–15.5)
WBC: 6.8 10*3/uL (ref 4.0–10.5)
nRBC: 0 % (ref 0.0–0.2)

## 2020-05-06 LAB — TROPONIN I (HIGH SENSITIVITY)
Troponin I (High Sensitivity): 12 ng/L (ref <18)
Troponin I (High Sensitivity): 11 ng/L (ref <18)

## 2020-05-06 MED ORDER — LEVOFLOXACIN 500 MG PO TABS: 500.0000 mg | ORAL_TABLET | Freq: Every day | ORAL | 0 refills | Status: DC

## 2020-05-06 MED ORDER — MORPHINE SULFATE (PF) 4 MG/ML IV SOLN
4.0000 mg | Freq: Once | INTRAVENOUS | Status: AC
  Administered 2020-05-06: 4 mg via INTRAVENOUS
  Filled 2020-05-06: qty 1

## 2020-05-06 MED ORDER — HYDROCODONE-ACETAMINOPHEN 5-325 MG PO TABS
1.0000 | ORAL_TABLET | Freq: Once | ORAL | Status: AC
  Administered 2020-05-06: 1 via ORAL
  Filled 2020-05-06: qty 1

## 2020-05-06 MED ORDER — ONDANSETRON HCL 4 MG/2ML IJ SOLN
4.0000 mg | Freq: Once | INTRAMUSCULAR | Status: AC
  Administered 2020-05-06: 4 mg via INTRAVENOUS
  Filled 2020-05-06: qty 2

## 2020-05-06 MED ORDER — LEVOFLOXACIN IN D5W 750 MG/150ML IV SOLN
750.0000 mg | Freq: Once | INTRAVENOUS | Status: AC
  Administered 2020-05-06: 750 mg via INTRAVENOUS
  Filled 2020-05-06: qty 150

## 2020-05-06 NOTE — ED Notes (Signed)
[facility] called for transport.  

## 2020-05-06 NOTE — ED Notes (Signed)
This RN has tried to call [facility] x[facility] without success. No one answers the phone.

## 2020-05-06 NOTE — ED Triage Notes (Signed)
Patient reports to the ER for Abdominal/chest pain. Patient has hypertension as well. Patient given 0.4 of nitro via [facility].  Patient is from [facility].  Patient is wheelchair bound at baseline Patient diagnozed with COVID in the last 2 weeks

## 2020-05-06 NOTE — ED Notes (Signed)
Patient's sister informed of patient's status of plan to be discharged

## 2020-05-06 NOTE — ED Provider Notes (Addendum)
[name] [name]   CSN: [id number] Arrival date & time: 05/06/20  1410     History Chief Complaint  Patient presents with  . Abdominal Pain    [name] [name] is a 87 y.o. female.  Pt presents to the ED today with chest and abdominal pain.  Pt has been having abdominal pain for several days.  She was seen in the ED on 1/19.  She was diagnosed with Covid and a UTI.  Pt has an indwelling catheter and urine grew out Klebsiella.  They did not send her home with abx.  Pharmacy made a [name] that no further treatment was needed.  It is unclear if she was put on abx at the facility.  Pt feels like she still has a uti.  On her visit on 1/19, she had a CT abd/pelvis and a GB US.  It showed a distended GB with sludge, but no obvious stones.  No cholecystitis.  Pt developed the cp today.  She said it feels like pressure.  EMS gave her 1 nitro which has helped.  The facility gave her ASA.  She denies any fevers or cough.        Past Medical History:  Diagnosis Date  . Abnormal gait   . Asthenia   . Back pain   . Bladder cancer ([place])   . CAD (coronary artery disease)   . Chronic pain   . CKD (chronic kidney disease), stage IV ([place])   . Dizziness   . Dysphagia   . 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 Essential hypertension, benign   . Hypokalemia   . Insomnia   . Iron deficiency   . Macular degeneration   . Mixed hyperlipidemia   . Obesity   . Postsurgical aortocoronary bypass status     Patient Active Problem List   Diagnosis Date Noted  . Recurrent UTI 03/11/2020  . Exudative age-related macular degeneration of right eye with inactive choroidal neovascularization ([place]) 12/20/2019  . Degenerative retinal drusen of both eyes 12/20/2019  . Exudative age-related macular degeneration of left eye with inactive choroidal neovascularization ([place]) 12/20/2019  . Traumatic subdural hematoma ([place]) 08/08/2018  . Pyelonephritis 09/09/2016  . Falls  frequently 06/24/2016  . Tension headache 11/10/2013  . Chest pain 11/03/2011  . ARF (acute renal failure) ([place]) 04/30/2011  . UTI (urinary tract infection) 04/30/2011  . CKD (chronic kidney disease), stage IV ([place]) 04/30/2011  . Anemia 04/30/2011  . S/p nephrectomy 04/30/2011  . Macular degeneration 04/30/2011  . Cataract 04/30/2011  . Bladder cancer ([place]) 04/30/2011  . Weakness generalized 04/29/2011  . Seizure disorder ([place]) 04/29/2011  . Benign hypertension 04/29/2011  . CAD (coronary artery disease) 04/29/2011  . Hyperlipidemia 04/29/2011  . Anxiety 04/29/2011  . Dizziness - light-headed 04/29/2011  . Chronic abdominal pain 04/29/2011  . Mixed hyperlipidemia 05/24/2008  . Essential hypertension, benign 05/24/2008  . Postsurgical aortocoronary bypass status 05/24/2008    Past Surgical History:  Procedure Laterality Date  . CATARACT EXTRACTION    . CORONARY ARTERY BYPASS GRAFT    . IRIDECTOMY    . NEPHRECTOMY  2000     OB History    Gravida  3   Para  3   Term  3   Preterm      AB      Living  3     SAB      IAB      Ectopic  Multiple      Live Births              Family History  Problem Relation Age of Onset  . Colon cancer Mother   . Heart attack Father   . Diabetes Father   . Stroke Other   . Coronary artery disease Other   . Cancer Other   . Diabetes Other     Social History   Tobacco Use  . Smoking status: Never Smoker  . Smokeless tobacco: Never Used  Vaping Use  . Vaping Use: Never used  Substance Use Topics  . Alcohol use: No  . Drug use: No    Home Medications Prior to Admission medications   Medication Sig Start Date End Date Taking? Authorizing Provider  levofloxacin (LEVAQUIN) 500 MG tablet Take 1 tablet (500 mg total) by mouth daily. 05/06/20  Yes Isla Pence, MD  acetaminophen (TYLENOL) 325 MG tablet Take 650 mg by mouth every 8 (eight) hours. With hydrocodone    [provider]  amLODipine  (NORVASC) 10 MG tablet Take 1 tablet (10 mg total) by mouth daily. 03/14/20   Shelly Coss, MD  aspirin EC 81 MG tablet Take 81 mg by mouth daily. Swallow whole.    [provider]  clonazePAM (KLONOPIN) 0.5 MG tablet Take 1 tablet (0.5 mg total) by mouth at bedtime. Patient taking differently: Take 0.5 mg by mouth 2 (two) times daily.  08/10/18   Domenic Polite, MD  diphenhydrAMINE [facility]) 25 mg capsule Take 25 mg by mouth every 8 (eight) hours as needed for itching.    [provider]  gabapentin (NEURONTIN) 300 MG capsule Take 1 capsule (300 mg total) by mouth at bedtime. 08/10/18   Domenic Polite, MD  HYDROcodone-acetaminophen (NORCO/VICODIN) 5-325 MG tablet Take 1 tablet by mouth every 8 (eight) hours.     [provider]  levETIRAcetam (KEPPRA) 250 MG tablet Take 250 mg by mouth 2 (two) times daily.     [provider]  linaclotide (LINZESS) 290 MCG CAPS capsule Take 290 mcg by mouth daily.    [provider]  mirabegron ER (MYRBETRIQ) 25 MG TB24 tablet Take 25 mg by mouth daily.    [provider]  Multiple Vitamins-Minerals (PRESERVISION AREDS PO) Take 1 capsule by mouth daily.    [provider]  NONFORMULARY OR COMPOUNDED ITEM Apply 1 application topically in the morning and at bedtime. Anti -itch lotion 0.5-0.5% ( Mix with triamcinolone)    [provider]  polyethylene glycol (MIRALAX / GLYCOLAX) packet Take 17 g by mouth every other day. As needed for constipation.    [provider]  Pumpkin Seed-Soy Germ (AZO BLADDER CONTROL/GO-LESS) CAPS Take 1 capsule by mouth daily.     [provider]  rosuvastatin (CRESTOR) 10 MG tablet Take 10 mg by mouth daily.  01/06/11   [provider]  sodium bicarbonate 650 MG tablet Take 1 tablet (650 mg total) by mouth 2 (two) times daily. 03/13/20   Shelly Coss, MD  sodium zirconium cyclosilicate (LOKELMA) 10 g PACK packet Take 10 g by mouth See admin  instructions. Takes twice weekly on Monday and Thursday    [provider]  Suvorexant (BELSOMRA) 10 MG TABS Take 10 mg by mouth at bedtime.    [provider]  tizanidine (ZANAFLEX) 2 MG capsule Take 2 mg by mouth 2 (two) times daily as needed for muscle spasms.    [provider]  triamcinolone cream (KENALOG) 0.1 %  Apply 1 application topically 2 (two) times daily. Mix with Sarna lotion    [provider]  Vibegron (GEMTESA) 75 MG TABS Take 75 mg by mouth daily.    [provider]    Allergies    Adhesive [tape], Keflet [cephalexin], Latex, Penicillins, and Sulfa antibiotics  Review of Systems   Review of Systems  Cardiovascular: Positive for chest pain.  Gastrointestinal: Positive for abdominal pain.  All other systems reviewed and are negative.   Physical Exam Updated Vital Signs BP 103/62   Pulse 71   Temp 98.4 F (36.9 C) (Oral)   Resp 15   SpO2 95%   Physical Exam Vitals and nursing [name] reviewed.  Constitutional:      Appearance: She is well-developed.  HENT:     Head: Normocephalic and atraumatic.     Mouth/Throat:     Mouth: Mucous membranes are moist.     Pharynx: Oropharynx is clear.  Eyes:     Extraocular Movements: Extraocular movements intact.     Pupils: Pupils are equal, round, and reactive to light.  Cardiovascular:     Rate and Rhythm: Normal rate and regular rhythm.     Heart sounds: Normal heart sounds.  Pulmonary:     Effort: Pulmonary effort is normal.     Breath sounds: Normal breath sounds.  Abdominal:     General: Abdomen is flat. Bowel sounds are normal.     Palpations: Abdomen is soft.     Tenderness: There is abdominal tenderness in the right upper quadrant and epigastric area.  Skin:    General: Skin is warm and dry.     Capillary Refill: Capillary refill takes less than 2 seconds.  Neurological:     General: No focal deficit present.     Mental Status: She is alert and oriented to person,  place, and time.  Psychiatric:        Mood and Affect: Mood normal.        Behavior: Behavior normal.     ED Results / Procedures / Treatments   Labs (all labs ordered are listed, but only abnormal results are displayed) Labs Reviewed  COMPREHENSIVE METABOLIC PANEL - Abnormal; Notable for the following components:      Result Value   Glucose, Bld 117 (*)    BUN 38 (*)    Creatinine, Ser 2.63 (*)    GFR, Estimated 17 (*)    All other components within normal limits  LIPASE, BLOOD - Abnormal; Notable for the following components:   Lipase 70 (*)    All other components within normal limits  URINALYSIS, ROUTINE W REFLEX MICROSCOPIC - Abnormal; Notable for the following components:   APPearance CLOUDY (*)    Hgb urine dipstick MODERATE (*)    Ketones, ur 5 (*)    Protein, ur 100 (*)    Leukocytes,Ua MODERATE (*)    RBC / HPF >50 (*)    Bacteria, UA MANY (*)    All other components within normal limits  URINE CULTURE  CBC  TROPONIN I (HIGH SENSITIVITY)  TROPONIN I (HIGH SENSITIVITY)    EKG EKG Interpretation  Date/Time:  Sunday May 06 2020 14:27:31 EST Ventricular Rate:  77 PR Interval:    QRS Duration: 96 QT Interval:  419 QTC Calculation: 475 R Axis:   16 Text Interpretation: Sinus rhythm Abnormal R-wave progression, late transition No significant change since last tracing Confirmed by Isla Pence [phone]) on 05/06/2020 3:55:14 PM   Radiology DG Chest  Port 1 View  Result Date: 05/06/2020 CLINICAL DATA:  Chest pain EXAM: PORTABLE CHEST 1 VIEW COMPARISON:  Chest x-rays dated 04/25/2020 and 10/10/2019. FINDINGS: Heart size and mediastinal contours are stable. Lungs are clear. No pleural effusion or pneumothorax is seen. Median sternotomy wires appear intact and stable in alignment. IMPRESSION: No active disease. No evidence of pneumonia or pulmonary edema. Electronically Signed   By: Franki Cabot M.D.   On: 05/06/2020 15:20    Procedures Procedures    Medications Ordered in ED Medications  levofloxacin (LEVAQUIN) IVPB 750 mg (750 mg Intravenous New Bag/Given 05/06/20 1619)  HYDROcodone-acetaminophen (NORCO/VICODIN) 5-325 MG per tablet 1 tablet (has no administration in time range)  morphine 4 MG/ML injection 4 mg (4 mg Intravenous Given 05/06/20 1539)  ondansetron (ZOFRAN) injection 4 mg (4 mg Intravenous Given 05/06/20 1538)    ED Course  I have reviewed the triage vital signs and the nursing notes.  Pertinent labs & imaging results that were available during my care of the patient were reviewed by me and considered in my medical decision making (see chart for details).    MDM Rules/Calculators/A&P                          I looked on pt's med list from the facility and there are no abx listed.  Pt's urine culture reviewed.  Klebsiella resistant to ampicillin and nitrofurantoin.  Pt allergic to keflex and sulfa.  Levaquin started.  LFTs and bilirubin nl.  Kidney function is stable with stage 4 ckd.  Cardiac work up negative.  Pt told me that she does not like the facility where she lives and does not want to go back there.  I will consult SW to help facilitate transfer to another place, but I think that can be done as an outpatient.  She is stable for d/c back to her facility.  She is out of her quarantine window, so I will order an outpatient hida scan for her ruq pain.  I am also going to refer her to GI.  She is stable for d/c.  Return if worse.  Final Clinical Impression(s) / ED Diagnoses Final diagnoses:  Urinary tract infection associated with indwelling urethral catheter, initial encounter ([place])  Chest pain, unspecified type  Right upper quadrant abdominal pain  CKD (chronic kidney disease) stage 4, GFR 15-29 ml/min ([place])    Rx / DC Orders ED Discharge Orders         Ordered    levofloxacin (LEVAQUIN) 500 MG tablet  Daily        05/06/20 1742    NM Hepato W/EF        05/06/20 1746    Ambulatory referral to  Gastroenterology        05/06/20 [place], Sana Tessmer, MD 05/06/20 1747    Isla Pence, MD 05/06/20 [place], Langley Ingalls, MD 05/06/20 1749

## 2020-05-09 LAB — URINE CULTURE: Culture: >=100000 — AB

## 2020-05-11 ENCOUNTER — Other Ambulatory Visit: Payer: Self-pay

## 2020-05-11 ENCOUNTER — Non-Acute Institutional Stay: Payer: Medicare Other | Admitting: Hospice

## 2020-05-11 DIAGNOSIS — N184 Chronic kidney disease, stage 4 (severe): Secondary | ICD-10-CM

## 2020-05-11 DIAGNOSIS — Z515 Encounter for palliative care: Secondary | ICD-10-CM

## 2020-05-11 NOTE — Progress Notes (Signed)
PATIENT NAME: [name] [name] [address] [place] [address] [phone] (home)  DOB: [birth year] MRN: [id number]  PRIMARY CARE PROVIDER:    Celene Squibb, MD,  [place] [address] [phone]  REFERRING PROVIDER:   Celene Squibb, MD [place],  [place] [address] [phone]  RESPONSIBLE PARTY:   Extended Emergency Contact Information Primary Emergency Contact: Reed,Carolyn Address: [address]          [place], [place] [address] [place] Phone: [phone] Relation: Sister Secondary Emergency Contact: Pettigrew,David Address: [place] [address], [place] [address] [place] Mobile Phone: [phone] Relation: Son  CHIEF COMPLAIN: Initial palliative care consult/CKD stage 4  Visit is to build trust and highlight Palliative Medicine as specialized medical care for people living with serious illness, aimed at facilitating better quality of life through symptoms relief, assisting with advance care plan and establishing goals of care. NP called carolyn and updated her on visit. She endorsed Palliative service. Patient was evaluated for Hospice but was not eligible. Discussion on the difference between Palliative and Hospice care.  Visit consisted of counseling and education dealing with the complex and emotionally intense issues of symptom management and palliative care in the setting of serious and potentially life-threatening illness. Palliative care team will continue to support patient, patient's family, and medical team.  RECOMMENDATIONS/PLAN:   Advance Care Planning: Our advance care planning conversation included a discussion about  the value and importance of advance care planning, exploration of goals of care in the event of a sudden injury or illness, identification and preparation of a healthcare agent and review and updating or creation of an advance directive document.  CODE  STATUS: Reviewed code status. Patient is DO NOT RESUSCITATE. Signed DNR  Form in facility chart; same document uploaded to Epic today.  GOALS OF CARE: Goals of care include to maximize quality of life and symptom management. MOST form selections in facility chart include Comfort measures, no feeding tube, IV fluids if indicated, determine use of antibiotic when infecion occrs. I spent 46 minutes providing this initial consultation. More than 50% of the time in this consultation was spent on counseling patient and coordinating communication. -------------------------------------------------------------------------------------------------------------------------------------- Symptom Management/Plan:  Patient with CKD stage 4, still making adequate urine, refuses any plan for dialysis. Denies pain/discomfort. Has a patent Foley Cath. Continue with Amlodipine, Azo Bladder. Avoid nephrotoxic drugs; weigh benefit of use vs risks. Nephrologist consult as needed.  Provided general support and encouragement, no other unmet needs identified at this time. Follow up: Palliative will continue to monitor for symptom management/decline and make recommendations as needed. Return 6 weeks or prn. Encouraged to call provider sooner with any concerns.   PPS: 40%  Family /Caregiver/Community Supports:     HISTORY OF PRESENT ILLNESS:  [name] [name] is a 87 y.o. female with multiple medical problems including CKD stage 4, currently making adequate urine, denies kidney pain felt at the back, no edema no shortness of breath; she has a foley cath. She denies urinary symptoms. Also with history of Bladder CA, CAD, seizure disorder.  History obtained from review of EMR, discussion with patient/family. Rest of 10 point ROS asked and negative.  HOSPICE ELIGIBILITY/DIAGNOSIS: TBD  PAST MEDICAL HISTORY:  Past Medical History:  Diagnosis Date  . Abnormal gait   . Asthenia   . Back pain   .  Bladder cancer ([place])   . CAD  (coronary artery disease)   . Chronic pain   . CKD (chronic kidney disease), stage IV ([place])   . Dizziness   . Dysphagia   . Epileptic grand mal status ([place])   . Essential hypertension, benign   . Hypokalemia   . Insomnia   . Iron deficiency   . Macular degeneration   . Mixed hyperlipidemia   . Obesity   . Postsurgical aortocoronary bypass status      SOCIAL HX:  Social History   Tobacco Use  . Smoking status: Never Smoker  . Smokeless tobacco: Never Used  Substance Use Topics  . Alcohol use: No    FAMILY HX:  Family History  Problem Relation Age of Onset  . Colon cancer Mother   . Heart attack Father   . Diabetes Father   . Stroke Other   . Coronary artery disease Other   . Cancer Other   . Diabetes Other     Labs:   Recent Labs  Lab 05/06/20 1429  WBC 6.8  HGB 12.8  HCT 41.2  PLT 224  MCV 91.8   Recent Labs  Lab 05/06/20 1429  NA 143  K 4.1  CL 104  CO2 27  BUN 38*  CREATININE 2.63*  GLUCOSE 117*   Latest GFR by Cockcroft Gault (not valid in AKI or ESRD) CrCl cannot be calculated (Unknown ideal weight.). Recent Labs  Lab 05/06/20 1429  AST 23  ALT 12  ALKPHOS 74   No components found for: ALB No results for input(s): APTT, INR in the last 168 hours.  Invalid input(s): PTPATIENT No results for input(s): BNP, PROBNP in the last 168 hours. Review or lab tests,/diagnostics  ALLERGIES:  Allergies  Allergen Reactions  . Adhesive [Tape] Other (See Comments)    Tears patients skin off.   [name] [Cephalexin] Nausea And Vomiting  . Latex Itching  . Penicillins Hives and Itching  . Sulfa Antibiotics Hives and Itching      PERTINENT MEDICATIONS:  Outpatient Encounter Medications as of 05/11/2020  Medication Sig  . acetaminophen (TYLENOL) 325 MG tablet Take 650 mg by mouth every 8 (eight) hours. With hydrocodone  . amLODipine (NORVASC) 10 MG tablet Take 1 tablet (10 mg total) by mouth daily.  Marland Kitchen aspirin EC 81 MG tablet Take 81 mg by mouth  daily. Swallow whole.  . clonazePAM (KLONOPIN) 0.5 MG tablet Take 1 tablet (0.5 mg total) by mouth at bedtime. (Patient taking differently: Take 0.5 mg by mouth 2 (two) times daily. )  . diphenhydrAMINE (BANOPHEN) 25 mg capsule Take 25 mg by mouth every 8 (eight) hours as needed for itching.  . gabapentin (NEURONTIN) 300 MG capsule Take 1 capsule (300 mg total) by mouth at bedtime.  Marland Kitchen HYDROcodone-acetaminophen (NORCO/VICODIN) 5-325 MG tablet Take 1 tablet by mouth every 8 (eight) hours.   . levETIRAcetam (KEPPRA) 250 MG tablet Take 250 mg by mouth 2 (two) times daily.   Marland Kitchen levofloxacin (LEVAQUIN) 500 MG tablet Take 1 tablet (500 mg total) by mouth daily.  Marland Kitchen linaclotide (LINZESS) 290 MCG CAPS capsule Take 290 mcg by mouth daily.  . mirabegron ER (MYRBETRIQ) 25 MG TB24 tablet Take 25 mg by mouth daily.  . Multiple Vitamins-Minerals (PRESERVISION AREDS PO) Take 1 capsule by mouth daily.  . NONFORMULARY OR COMPOUNDED ITEM Apply 1 application topically in the morning and at bedtime. Anti -itch lotion 0.5-0.5% ( Mix with triamcinolone)  . polyethylene glycol (MIRALAX /  GLYCOLAX) packet Take 17 g by mouth every other day. As needed for constipation.  . Pumpkin Seed-Soy Germ (AZO BLADDER CONTROL/GO-LESS) CAPS Take 1 capsule by mouth daily.   . rosuvastatin (CRESTOR) 10 MG tablet Take 10 mg by mouth daily.   . sodium bicarbonate 650 MG tablet Take 1 tablet (650 mg total) by mouth 2 (two) times daily.  . sodium zirconium cyclosilicate (LOKELMA) 10 g PACK packet Take 10 g by mouth See admin instructions. Takes twice weekly on Monday and Thursday  . Suvorexant (BELSOMRA) 10 MG TABS Take 10 mg by mouth at bedtime.  . tizanidine (ZANAFLEX) 2 MG capsule Take 2 mg by mouth 2 (two) times daily as needed for muscle spasms.  Marland Kitchen triamcinolone cream (KENALOG) 0.1 % Apply 1 application topically 2 (two) times daily. Mix with Sarna lotion  . Vibegron (GEMTESA) 75 MG TABS Take 75 mg by mouth daily.   No  facility-administered encounter medications on file as of 05/11/2020.    ROS  General: NAD Constitution: Denies fever/chills EYES: denies vision changes ENMT: denies Xerostomia Cardiovascular: denies chest pain Pulmonary: denies  cough, denies dyspnea  Abdomen: endorses fair appetite, denies constipation or diarrhea GU: denies dysuria MSK:  endorses ROM limitations, no falls reported; no joint pain Skin: denies rashes/bruising Neurological: endorses weakness, denies pain, denies insomnia Psych: Endorses positive mood Heme/lymph/immuno: denies bruises, no abnormal bleeding   PHYSICAL EXAM  Height:   5 feet 5    Weight: 163 Ibs Constitutional: In no acute distress, well developed and well nourished Cardiovascular: regular rate and rhythm Pulmonary: no cough, no work of breathing, normal respiratory effort Abdomen: soft, non tender, positive bowel sounds in all quadrants GU:  no suprapubic tenderness; Foley Cath in place, patent Eyes: Normal lids, no discharge, sclera anicteric ENMT: Moist mucous membranes Musculoskeletal:  no edema in BLE Skin: no rash to visible skin, dry skin,warm without cyanosis Psych: non-anxious affect Neurological: Weakness but otherwise non focal Heme/lymph/immuno: no bruises, no bleeding   Palliative Care was asked to follow this patient by consultation request of Celene Squibb, MD to help address advance care planning and complex decision making. Thank you for the opportunity to participate in the care of [name] [name] Please call our office at [phone] if we can be of additional assistance.  Note: Portions of this note were generated with [employer]obbyist. Dictation errors may occur despite best attempts at proofreading.  Teodoro Spray, NP

## 2020-05-28 ENCOUNTER — Non-Acute Institutional Stay: Payer: Medicare Other | Admitting: Hospice

## 2020-05-28 ENCOUNTER — Other Ambulatory Visit: Payer: Self-pay

## 2020-05-28 DIAGNOSIS — Z515 Encounter for palliative care: Secondary | ICD-10-CM

## 2020-05-28 DIAGNOSIS — M5440 Lumbago with sciatica, unspecified side: Secondary | ICD-10-CM

## 2020-05-28 NOTE — Progress Notes (Signed)
[facility] Consult Note Telephone: [phone]  Fax: [phone]  PATIENT NAME: [name] [name] DOB: [birth year] MRN: [id number]  PRIMARY CARE PROVIDER:   Celene Squibb, MD Celene Squibb, MD [place],  [place] [address]  REFERRING PROVIDER: Celene Squibb, MD Celene Squibb, MD [place],  [place] [address]  RESPONSIBLE PARTY:   Extended Emergency Contact Information Primary Emergency Contact: Reed,Carolyn Address: [place]          [place], [place] [address] [place] Phone: [phone] Relation: Sister Secondary Emergency Contact: Pettigrew,David Address: [place] [address] [place] [address] [place] Mobile Phone: [phone] Relation: Son   Visit is to build trust and highlight Palliative Medicine as specialized medical care for people living with serious illness, aimed at facilitating better quality of life through symptoms relief, assisting with advance care plan and establishing goals of care.   CHIEF COMPLAINT: Follow up palliative visit/low back pain  RECOMMENDATIONS/PLAN:   CODE STATUS:  Patient is DO NOT RESUSCITATE.   GOALS OF CARE: Goals of care include to maximize quality of life and symptom management. MOST form selections in facility chart include Comfort measures, no feeding tube, IV fluids if indicated, determine use of antibiotic when infecion occurs.  Visit consisted of counseling and education dealing with the complex and emotionally intense issues of symptom management and palliative care in the setting of serious and potentially life-threatening illness. Palliative care team will continue to support patient, patient's family, and medical team.  I spent 20 minutes providing this consultation. More than 50% of the time in this consultation was spent on coordinating communication.   -------------------------------------------------------------------------------------------------------------------------------------------------- 3. Symptom management/Plan:  Low back pain: Continue hydrocodone as currently scheduled 5/325 mg 3 times daily as needed.  Use heat pack as needed. Palliative will continue to monitor for symptom management/decline and make recommendations as needed. Return 2 months or prn. Encouraged to call provider sooner with any concerns.   HISTORY OF PRESENT ILLNESS:  [name] [name] is a 87 y.o. female with multiple medical problems including low back pain which is chronic, intermittent and sometimes radiates to either of her legs.this afternoon 7/10; just took hydrocodone and pain at 3/10 which is her pain threshold.  Patient reports it is worse on waking up but gets better during the day.  Pain has been going on for more than 2 years and has impaired her independence and activities of daily living.  History of CKD4, CAD, bladder cancer-Foley cath in place, seizure disorder.  History obtained from review of EMR, discussion with patient/family. Review and summarization of Epic records shows history from other than patient. Rest of 10 point ROS asked and negative. Palliative Care was asked to follow this patient by consultation request of Celene Squibb, MD to help address complex decision making in the context of goals of care.   CODE STATUS: DO NOT RESUSCITATE  PPS: 40%  HOSPICE ELIGIBILITY/DIAGNOSIS: TBD  PAST MEDICAL HISTORY:  Past Medical History:  Diagnosis Date  . Abnormal gait   . Asthenia   . Back pain   . Bladder cancer ([place])   . CAD (coronary artery disease)   . Chronic pain   . CKD (chronic kidney disease), stage IV ([place])   . Dizziness   . Dysphagia   . Epileptic grand mal status ([place])   .  Essential hypertension, benign   . Hypokalemia   . Insomnia   . Iron deficiency   . Macular degeneration   . Mixed hyperlipidemia   . Obesity   .  Postsurgical aortocoronary bypass status     SOCIAL HX: @SOCX  Patient in assisted living for ongoing care   FAMILY HX:  Family History  Problem Relation Age of Onset  . Colon cancer Mother   . Heart attack Father   . Diabetes Father   . Stroke Other   . Coronary artery disease Other   . Cancer Other   . Diabetes Other    Review lab tests/diagnostics No results for input(s): WBC, HGB, HCT, PLT, MCV in the last 168 hours. No results for input(s): NA, K, CL, CO2, BUN, CREATININE, GLUCOSE in the last 168 hours. Latest GFR by Cockcroft Gault (not valid in AKI or ESRD) CrCl cannot be calculated (Patient's most recent lab result is older than the maximum 21 days allowed.). No results for input(s): AST, ALT, ALKPHOS, GGT in the last 168 hours.  Invalid input(s): TBILI, CONJBILI, ALB, TOTALPROTEIN No components found for: ALB No results for input(s): APTT, INR in the last 168 hours.  Invalid input(s): PTPATIENT No results for input(s): BNP, PROBNP in the last 168 hours.  ALLERGIES:  Allergies  Allergen Reactions  . Adhesive [Tape] Other (See Comments)    Tears patients skin off.   [name] [Cephalexin] Nausea And Vomiting  . Latex Itching  . Penicillins Hives and Itching  . Sulfa Antibiotics Hives and Itching      PERTINENT MEDICATIONS:  Outpatient Encounter Medications as of 05/28/2020  Medication Sig  . acetaminophen (TYLENOL) 325 MG tablet Take 650 mg by mouth every 8 (eight) hours. With hydrocodone  . amLODipine (NORVASC) 10 MG tablet Take 1 tablet (10 mg total) by mouth daily.  Marland Kitchen aspirin EC 81 MG tablet Take 81 mg by mouth daily. Swallow whole.  . clonazePAM (KLONOPIN) 0.5 MG tablet Take 1 tablet (0.5 mg total) by mouth at bedtime. (Patient taking differently: Take 0.5 mg by mouth 2 (two) times daily. )  . diphenhydrAMINE (BANOPHEN) 25 mg capsule Take 25 mg by mouth every 8 (eight) hours as needed for itching.  . gabapentin (NEURONTIN) 300 MG capsule Take 1 capsule (300 mg  total) by mouth at bedtime.  Marland Kitchen HYDROcodone-acetaminophen (NORCO/VICODIN) 5-325 MG tablet Take 1 tablet by mouth every 8 (eight) hours.   . levETIRAcetam (KEPPRA) 250 MG tablet Take 250 mg by mouth 2 (two) times daily.   Marland Kitchen levofloxacin (LEVAQUIN) 500 MG tablet Take 1 tablet (500 mg total) by mouth daily.  Marland Kitchen linaclotide (LINZESS) 290 MCG CAPS capsule Take 290 mcg by mouth daily.  . mirabegron ER (MYRBETRIQ) 25 MG TB24 tablet Take 25 mg by mouth daily.  . Multiple Vitamins-Minerals (PRESERVISION AREDS PO) Take 1 capsule by mouth daily.  . NONFORMULARY OR COMPOUNDED ITEM Apply 1 application topically in the morning and at bedtime. Anti -itch lotion 0.5-0.5% ( Mix with triamcinolone)  . polyethylene glycol (MIRALAX / GLYCOLAX) packet Take 17 g by mouth every other day. As needed for constipation.  . Pumpkin Seed-Soy Germ (AZO BLADDER CONTROL/GO-LESS) CAPS Take 1 capsule by mouth daily.   . rosuvastatin (CRESTOR) 10 MG tablet Take 10 mg by mouth daily.   . sodium bicarbonate 650 MG tablet Take 1 tablet (650 mg total) by mouth 2 (two) times daily.  . sodium zirconium cyclosilicate (LOKELMA) 10 g PACK packet Take 10 g by mouth See admin  instructions. Takes twice weekly on Monday and Thursday  . Suvorexant (BELSOMRA) 10 MG TABS Take 10 mg by mouth at bedtime.  . tizanidine (ZANAFLEX) 2 MG capsule Take 2 mg by mouth 2 (two) times daily as needed for muscle spasms.  Marland Kitchen triamcinolone cream (KENALOG) 0.1 % Apply 1 application topically 2 (two) times daily. Mix with Sarna lotion  . Vibegron (GEMTESA) 75 MG TABS Take 75 mg by mouth daily.   No facility-administered encounter medications on file as of 05/28/2020.    ROS General: NAD EYES: denies vision changes ENMT: denies xerostomia Cardiovascular: denies chest pain Pulmonary: denies  cough, denies SOB  Abdomen: endorses fair appetite, no constipation or diarrhea GU: denies dysuria MSK:  endorses ROM limitations, no falls reported, endorses low back  pain Skin: denies rashes or wounds Neurological: endorses weakness, denies pain, denies insomnia Psych: Endorses positive mood Heme/lymph/immuno: denies bruises, abnormal bleeding   PHYSICAL EXAM  Height 5 feet 5 inches weight 163 pounds General: In no acute distress, well-developed well-nourished Cardiovascular: regular rate and rhythm Pulmonary: no cough, no increased work of breathing, normal respiratory effort Abdomen: soft, non tender, positive bowel sounds in all quadrants GU:  no suprapubic tenderness, Foley cath in place, patent Eyes: Normal lids, no discharge, sclera anicteric ENMT: Moist mucous membranes Musculoskeletal:  no edema in BLE, Skin: no rash to visible skin, warm without cyanosis Psych: non-anxious affect Neurological: Weakness but otherwise non focal Heme/lymph/immuno: no bruises, no bleeding  Thank you for the opportunity to participate in the care of [name] [name] Please call our office at [phone] if we can be of additional assistance.  Note: Portions of this note were generated with [employer]. Dictation errors may occur despite best attempts at proofreading.  Teodoro Spray, NP

## 2020-05-31 ENCOUNTER — Encounter: Payer: Self-pay | Admitting: Nurse Practitioner

## 2020-05-31 ENCOUNTER — Ambulatory Visit (INDEPENDENT_AMBULATORY_CARE_PROVIDER_SITE_OTHER): Payer: Medicare Other | Admitting: Nurse Practitioner

## 2020-05-31 VITALS — BP 138/60 | HR 67 | Ht 62.0 in | Wt 144.0 lb

## 2020-05-31 DIAGNOSIS — R10A1 Flank pain, right side: Secondary | ICD-10-CM

## 2020-05-31 DIAGNOSIS — R935 Abnormal findings on diagnostic imaging of other abdominal regions, including retroperitoneum: Secondary | ICD-10-CM

## 2020-05-31 DIAGNOSIS — K5909 Other constipation: Secondary | ICD-10-CM

## 2020-05-31 DIAGNOSIS — R109 Unspecified abdominal pain: Secondary | ICD-10-CM | POA: Diagnosis not present

## 2020-05-31 DIAGNOSIS — R1011 Right upper quadrant pain: Secondary | ICD-10-CM

## 2020-05-31 NOTE — Progress Notes (Signed)
ASSESSMENT AND PLAN     # 87 yo female with RUQ / right flank pain evaluated several times in ED over the years. ED has expressed concern for biliary colic. Recent CT scan scan and US showed a moderately distended gallbladder with possible sludge without signs of acute cholecystitis, mild chronic dilation of CBD 9-10 mm.  Liver chemistries have been normal. Lipase mildly elevated at 70. Site of her pain seems to be more in right flank rather than RUQ. Exam is limited due to patient's mobility but there is a large bulge in right flank. Patient has been told she has a large hernia there ( site of remote right nephrectomy) but that she may not survive surgical repair.  -- Patient hasn't had any recent abdominal pain but I will ask Dr. Tarri Glenn to review recent US and CT scan for hepatobiliary pathology. [place] mentions mentions possible debris or sludge in the enlarged CBD but again liver chemistries are normal. .   # Indwelling foley, recurrent UTIs. Followed by Urology.   # Chronic constipation. Frequent constipation despite daily Linzess and Miralax as needed.  --Only drinks 30 oz water daily. Needs to increase water intake to at least 48 oz daily.  --Sometimes gets loose stool which may be overflow diarrhea and I explained this concept to patient and her sister. Advised against using anti-diarrheal agents which may exacerbate constipation. Instead just hold the Miralax or even Linzess if having diarrhea --Start daily Benefiber in 8 oz of water daily.   HISTORY OF PRESENT ILLNESS     Primary Gastroenterologist : new Chief Complaint : none  [name] [name] is a 87 y.o. female with PMH / [place] significant for  Scoliosis, HTN, hyperlipidemia, seizures, CKD 4, CAD / remote CABG, bladder cancer, R kidney cancer s/p nephrectomy, chronic right flank pain,  appendectomy   Patient is here with her sister. The ED arranged for this appointment for evaluation of RUQ pain.   04/25/20 ED visit for RUQ pain.  Liver chemistries and WBC normal. Non-contrast CT scan of abd/ pelvis negative for bowel pathology other than sigmoid colonic diverticulosis, progressive gallbladder distention without gallstones or gallbladder wall thickening, chronic mild CBD dilation. [place] remarkable for moderately distended gallbladder. No definite gallstones. Some layering echogenic sludge is noted. No gallbladder wall thickening . CBD 9.6 mm, with possible debris or sludge. UA was suspicious but ED held off on antibiotics pending culture. Discharged from ED with recommendations to see a surgeon for possible biliary colic. Of note she was found to be COVID positive in ED.   05/06/20 Return to ED for evaluation of chest pain and abdominal pain. Cardiac evaluation negative. Urine culture from 04/24/20 ED visit found to to positive for Klebsiella,  she was given prescription for Levaquin. Also, outpatient HIDA ordered and patient referred to GI.   Patient has chronic right flank / right sided abdominal pain She is s/p remote right nephrectomy and gives a history of a large hernia where kidney was removed.  It has been about 3 weeks since ED visit. Patient  cannot remember any details about the RUQ pain but points to a large bulging area in right flank  and says this is where she always hurts. She says Urology told her she probably would not survive hernia surgery. Patient denies nausea / vomiting. She endorses chronic, intermittent constipation but admits to only drinking about 3 small bottles of water a day. Additionally her diet consists mainly of starches. Patient believes she  takes Linzess everyday and also takes Miralax as needed. Sometimes she has loose stool but nursing facility treats that with imodium     Data Reviewed: 04/25/20 Non contrast CT scan  IMPRESSION: 1. Progressive gallbladder distension without gallstones or gallbladder wall thickening. Chronic mild common bile duct dilatation. Given right-sided abdominal pain,  further evaluation with right upper quadrant ultrasound is recommended. 2. New small left pleural effusion with increased mild left lower lobe atelectasis. 3. Aortic Atherosclerosis (ICD10-I70.0).  04/25/20 RUQ US IMPRESSION: 1. Moderately distended gallbladder with some layering echogenic sludge. No definite gallstones. 2. Common bile duct probably within normal limits given the patient's age. Possible sludge  05/06/20 EKG Text Interpretation:   Sinus rhythm Abnormal R-wave progression, late transition No significant change since last tracing Confirmed by Isla Pence [phone]) on 05/06/2020 3:55:14 PM   Past Medical History:  Diagnosis Date  . Abnormal gait   . Asthenia   . Back pain   . Bladder cancer ([place])   . CAD (coronary artery disease)   . Chronic pain   . CKD (chronic kidney disease), stage IV ([place])   . Dizziness   . Epileptic grand mal status ([place])   . Essential hypertension, benign   . Hypokalemia   . Insomnia   . Iron deficiency   . Macular degeneration   . Mixed hyperlipidemia   . Obesity   . Postsurgical aortocoronary bypass status      Past Surgical History:  Procedure Laterality Date  . CATARACT EXTRACTION    . CORONARY ARTERY BYPASS GRAFT    . IRIDECTOMY    . NEPHRECTOMY  2000   Family History  Problem Relation Age of Onset  . Colon cancer Mother   . Heart attack Father   . Diabetes Father   . Stroke Other   . Coronary artery disease Other   . Cancer Other   . Diabetes Other    Social History   Tobacco Use  . Smoking status: Never Smoker  . Smokeless tobacco: Never Used  Vaping Use  . Vaping Use: Never used  Substance Use Topics  . Alcohol use: No  . Drug use: No   Current Outpatient Medications  Medication Sig Dispense Refill  . acetaminophen (TYLENOL) 325 MG tablet Take 650 mg by mouth every 8 (eight) hours. With hydrocodone    . amLODipine (NORVASC) 10 MG tablet Take 1 tablet (10 mg total) by mouth daily. 30 tablet 1  . aspirin  EC 81 MG tablet Take 81 mg by mouth daily. Swallow whole.    . clonazePAM (KLONOPIN) 0.5 MG tablet Take 1 tablet (0.5 mg total) by mouth at bedtime. (Patient taking differently: Take 0.5 mg by mouth 2 (two) times daily.) 5 tablet 0  . diphenhydrAMINE (BENADRYL) 25 mg capsule Take 25 mg by mouth every 8 (eight) hours as needed for itching.    . gabapentin (NEURONTIN) 300 MG capsule Take 1 capsule (300 mg total) by mouth at bedtime.    Marland Kitchen HYDROcodone-acetaminophen (NORCO/VICODIN) 5-325 MG tablet Take 1 tablet by mouth every 8 (eight) hours.    . levETIRAcetam (KEPPRA) 250 MG tablet Take 250 mg by mouth 2 (two) times daily.     Marland Kitchen levofloxacin (LEVAQUIN) 500 MG tablet Take 1 tablet (500 mg total) by mouth daily. 7 tablet 0  . linaclotide (LINZESS) 290 MCG CAPS capsule Take 290 mcg by mouth daily.    . mirabegron ER (MYRBETRIQ) 25 MG TB24 tablet Take 25 mg by mouth daily.    Marland Kitchen  Multiple Vitamins-Minerals (PRESERVISION AREDS PO) Take 1 capsule by mouth daily.    . NONFORMULARY OR COMPOUNDED ITEM Apply 1 application topically in the morning and at bedtime. Anti -itch lotion 0.5-0.5% ( Mix with triamcinolone)    . polyethylene glycol (MIRALAX / GLYCOLAX) packet Take 17 g by mouth every other day. As needed for constipation.    . Pumpkin Seed-Soy Germ (AZO BLADDER CONTROL/GO-LESS) CAPS Take 1 capsule by mouth daily.     . rosuvastatin (CRESTOR) 10 MG tablet Take 10 mg by mouth daily.     . sodium bicarbonate 650 MG tablet Take 1 tablet (650 mg total) by mouth 2 (two) times daily. 60 tablet 1  . sodium zirconium cyclosilicate (LOKELMA) 10 g PACK packet Take 10 g by mouth See admin instructions. Takes twice weekly on Monday and Thursday    . Suvorexant (BELSOMRA) 10 MG TABS Take 10 mg by mouth at bedtime.    . tizanidine (ZANAFLEX) 2 MG capsule Take 2 mg by mouth 2 (two) times daily as needed for muscle spasms.    Marland Kitchen triamcinolone cream (KENALOG) 0.1 % Apply 1 application topically 2 (two) times daily. Mix with  Sarna lotion    . Vibegron (GEMTESA) 75 MG TABS Take 75 mg by mouth daily.     No current facility-administered medications for this visit.   Allergies  Allergen Reactions  . Adhesive [Tape] Other (See Comments)    Tears patients skin off.   [name] [Cephalexin] Nausea And Vomiting  . Latex Itching  . Penicillins Hives and Itching  . Sulfa Antibiotics Hives and Itching     Review of Systems: Positive for chronic back pain, chronic bilateral lower extremity pain.  All other systems reviewed and negative except where noted in HPI.   PHYSICAL EXAM :    Wt Readings from Last 3 Encounters:  05/31/20 144 lb (65.3 kg)  03/28/20 163 lb (73.9 kg)  03/11/20 150 lb (68 kg)    BP 138/60   Pulse 67   Ht 5\' 2"  (1.575 m)   Wt 144 lb (65.3 kg)   BMI 26.34 kg/m  Constitutional:  Pleasant female in no acute distress in wheelchair Psychiatric: Normal mood and affect. Behavior is normal. EENT: Pupils normal.  Conjunctivae are normal. No scleral icterus. Neck supple.  Cardiovascular: Normal rate, regular rhythm. No edema Pulmonary/chest: Effort normal and breath sounds normal. No wheezing, rales or rhonchi. Abdominal: Soft, nondistended, nontender. Bowel sounds active throughout. Limited exam with patient in wheelchair. Marland Kitchen Neurological: Alert and oriented to person place and time. Skin: Skin is warm and dry. No rashes noted.  Tye Savoy, NP  05/31/2020, 2:28 PM

## 2020-05-31 NOTE — Patient Instructions (Signed)
If you are age 85 or older, your body mass index should be between 23-30. Your Body mass index is 26.34 kg/m. If this is out of the aforementioned range listed, please consider follow up with your Primary Care Provider.  If you are age 93 or younger, your body mass index should be between 19-25. Your Body mass index is 26.34 kg/m. If this is out of the aformentioned range listed, please consider follow up with your Primary Care Provider.   Increase water intake to 48 ounces a day.  START Benefiber daily.  Please DO NOT give Imodium or any other anti-diarrheals. If having loose stools, HOLD constipation medications.   Follow up as needed.  Thank you for entrusting me with your care and choosing [facility].  Tye Savoy, NP-C

## 2020-06-01 NOTE — Progress Notes (Signed)
Reviewed and agree with management plans. Recent CT and ultrasound reviewed. Unclear if symptoms are biliary colic and/or if she would be a surgical candidate if they are.   Kimberly L. Tarri Glenn, MD, MPH

## 2020-06-05 ENCOUNTER — Emergency Department (HOSPITAL_COMMUNITY): Payer: Medicare Other

## 2020-06-05 ENCOUNTER — Other Ambulatory Visit: Payer: Self-pay

## 2020-06-05 ENCOUNTER — Encounter (HOSPITAL_COMMUNITY): Payer: Self-pay

## 2020-06-05 ENCOUNTER — Ambulatory Visit (HOSPITAL_COMMUNITY)
Admission: EM | Admit: 2020-06-05 | Discharge: 2020-06-05 | Disposition: A | Discharge status: Home / Self Care | Payer: Medicare Other | Source: Home / Self Care

## 2020-06-05 ENCOUNTER — Inpatient Hospital Stay (HOSPITAL_COMMUNITY)
Admission: EM | Admit: 2020-06-05 | Discharge: 2020-06-08 | DRG: 083 | Disposition: A | Discharge status: Skilled Nursing Facility | Payer: Medicare Other | Attending: Internal Medicine | Admitting: Internal Medicine

## 2020-06-05 DIAGNOSIS — Z66 Do not resuscitate: Secondary | ICD-10-CM | POA: Diagnosis present

## 2020-06-05 DIAGNOSIS — Z7982 Long term (current) use of aspirin: Secondary | ICD-10-CM

## 2020-06-05 DIAGNOSIS — I129 Hypertensive chronic kidney disease with stage 1 through stage 4 chronic kidney disease, or unspecified chronic kidney disease: Secondary | ICD-10-CM | POA: Diagnosis present

## 2020-06-05 DIAGNOSIS — N184 Chronic kidney disease, stage 4 (severe): Secondary | ICD-10-CM | POA: Diagnosis present

## 2020-06-05 DIAGNOSIS — Z881 Allergy status to other antibiotic agents status: Secondary | ICD-10-CM

## 2020-06-05 DIAGNOSIS — W1812XA Fall from or off toilet with subsequent striking against object, initial encounter: Secondary | ICD-10-CM | POA: Diagnosis present

## 2020-06-05 DIAGNOSIS — W19XXXA Unspecified fall, initial encounter: Secondary | ICD-10-CM

## 2020-06-05 DIAGNOSIS — I2510 Atherosclerotic heart disease of native coronary artery without angina pectoris: Secondary | ICD-10-CM | POA: Diagnosis not present

## 2020-06-05 DIAGNOSIS — Y92091 Bathroom in other non-institutional residence as the place of occurrence of the external cause: Secondary | ICD-10-CM

## 2020-06-05 DIAGNOSIS — E782 Mixed hyperlipidemia: Secondary | ICD-10-CM | POA: Diagnosis present

## 2020-06-05 DIAGNOSIS — Z8616 Personal history of COVID-19: Secondary | ICD-10-CM | POA: Diagnosis not present

## 2020-06-05 DIAGNOSIS — Z91040 Latex allergy status: Secondary | ICD-10-CM

## 2020-06-05 DIAGNOSIS — R296 Repeated falls: Secondary | ICD-10-CM | POA: Diagnosis present

## 2020-06-05 DIAGNOSIS — Z79891 Long term (current) use of opiate analgesic: Secondary | ICD-10-CM

## 2020-06-05 DIAGNOSIS — I10 Essential (primary) hypertension: Secondary | ICD-10-CM | POA: Diagnosis not present

## 2020-06-05 DIAGNOSIS — Z880 Allergy status to penicillin: Secondary | ICD-10-CM | POA: Diagnosis not present

## 2020-06-05 DIAGNOSIS — Z8249 Family history of ischemic heart disease and other diseases of the circulatory system: Secondary | ICD-10-CM

## 2020-06-05 DIAGNOSIS — S065X9A Traumatic subdural hemorrhage with loss of consciousness of unspecified duration, initial encounter: Principal | ICD-10-CM | POA: Diagnosis present

## 2020-06-05 DIAGNOSIS — E669 Obesity, unspecified: Secondary | ICD-10-CM | POA: Diagnosis present

## 2020-06-05 DIAGNOSIS — Z87440 Personal history of urinary (tract) infections: Secondary | ICD-10-CM

## 2020-06-05 DIAGNOSIS — Z905 Acquired absence of kidney: Secondary | ICD-10-CM | POA: Diagnosis not present

## 2020-06-05 DIAGNOSIS — Z951 Presence of aortocoronary bypass graft: Secondary | ICD-10-CM

## 2020-06-05 DIAGNOSIS — Z882 Allergy status to sulfonamides status: Secondary | ICD-10-CM

## 2020-06-05 DIAGNOSIS — D631 Anemia in chronic kidney disease: Secondary | ICD-10-CM | POA: Diagnosis present

## 2020-06-05 DIAGNOSIS — Y9389 Activity, other specified: Secondary | ICD-10-CM | POA: Diagnosis not present

## 2020-06-05 DIAGNOSIS — Z91048 Other nonmedicinal substance allergy status: Secondary | ICD-10-CM | POA: Diagnosis not present

## 2020-06-05 DIAGNOSIS — S065X0A Traumatic subdural hemorrhage without loss of consciousness, initial encounter: Secondary | ICD-10-CM | POA: Diagnosis not present

## 2020-06-05 DIAGNOSIS — Z800 Family history of malignant neoplasm of digestive organs: Secondary | ICD-10-CM

## 2020-06-05 DIAGNOSIS — Z79899 Other long term (current) drug therapy: Secondary | ICD-10-CM | POA: Diagnosis not present

## 2020-06-05 DIAGNOSIS — G40909 Epilepsy, unspecified, not intractable, without status epilepticus: Secondary | ICD-10-CM | POA: Diagnosis present

## 2020-06-05 DIAGNOSIS — Z993 Dependence on wheelchair: Secondary | ICD-10-CM | POA: Diagnosis not present

## 2020-06-05 DIAGNOSIS — Z6830 Body mass index (BMI) 30.0-30.9, adult: Secondary | ICD-10-CM

## 2020-06-05 DIAGNOSIS — Z823 Family history of stroke: Secondary | ICD-10-CM

## 2020-06-05 DIAGNOSIS — Z833 Family history of diabetes mellitus: Secondary | ICD-10-CM

## 2020-06-05 DIAGNOSIS — Z9181 History of falling: Secondary | ICD-10-CM

## 2020-06-05 DIAGNOSIS — M7989 Other specified soft tissue disorders: Secondary | ICD-10-CM | POA: Diagnosis not present

## 2020-06-05 DIAGNOSIS — S065XAA Traumatic subdural hemorrhage with loss of consciousness status unknown, initial encounter: Secondary | ICD-10-CM | POA: Diagnosis present

## 2020-06-05 DIAGNOSIS — Z8551 Personal history of malignant neoplasm of bladder: Secondary | ICD-10-CM

## 2020-06-05 LAB — CBC WITH DIFFERENTIAL/PLATELET
Abs Immature Granulocytes: 0.02 10*3/uL (ref 0.00–0.07)
Basophils Absolute: 0.1 10*3/uL (ref 0.0–0.1)
Basophils Relative: 1 %
Eosinophils Absolute: 0.2 10*3/uL (ref 0.0–0.5)
Eosinophils Relative: 3 %
HCT: 36.3 % (ref 36.0–46.0)
Hemoglobin: 11.4 g/dL — ABNORMAL LOW (ref 12.0–15.0)
Immature Granulocytes: 0 %
Lymphocytes Relative: 35 %
Lymphs Abs: 2.5 10*3/uL (ref 0.7–4.0)
MCH: 28.6 pg (ref 26.0–34.0)
MCHC: 31.4 g/dL (ref 30.0–36.0)
MCV: 91.2 fL (ref 80.0–100.0)
Monocytes Absolute: 0.7 10*3/uL (ref 0.1–1.0)
Monocytes Relative: 10 %
Neutro Abs: 3.7 10*3/uL (ref 1.7–7.7)
Neutrophils Relative %: 51 %
Platelets: 192 10*3/uL (ref 150–400)
RBC: 3.98 MIL/uL (ref 3.87–5.11)
RDW: 14.7 % (ref 11.5–15.5)
WBC: 7.2 10*3/uL (ref 4.0–10.5)
nRBC: 0 % (ref 0.0–0.2)

## 2020-06-05 LAB — COMPREHENSIVE METABOLIC PANEL
ALT: 12 U/L (ref 0–44)
AST: 21 U/L (ref 15–41)
Albumin: 3.4 g/dL — ABNORMAL LOW (ref 3.5–5.0)
Alkaline Phosphatase: 74 U/L (ref 38–126)
Anion gap: 11 (ref 5–15)
BUN: 35 mg/dL — ABNORMAL HIGH (ref 8–23)
CO2: 24 mmol/L (ref 22–32)
Calcium: 9.2 mg/dL (ref 8.9–10.3)
Chloride: 111 mmol/L (ref 98–111)
Creatinine, Ser: 2.6 mg/dL — ABNORMAL HIGH (ref 0.44–1.00)
GFR, Estimated: 17 mL/min — ABNORMAL LOW (ref >60)
Glucose, Bld: 98 mg/dL (ref 70–99)
Potassium: 4.4 mmol/L (ref 3.5–5.1)
Sodium: 146 mmol/L — ABNORMAL HIGH (ref 135–145)
Total Bilirubin: 0.7 mg/dL (ref 0.3–1.2)
Total Protein: 6.9 g/dL (ref 6.5–8.1)

## 2020-06-05 LAB — MRSA PCR SCREENING: MRSA by PCR: NEGATIVE

## 2020-06-05 MED ORDER — ONDANSETRON HCL 4 MG/2ML IJ SOLN: 4.0000 mg | Freq: Four times a day (QID) | INTRAMUSCULAR | Status: DC | PRN

## 2020-06-05 MED ORDER — LEVETIRACETAM 500 MG PO TABS
500.0000 mg | ORAL_TABLET | Freq: Two times a day (BID) | ORAL | Status: DC
  Administered 2020-06-05 – 2020-06-08 (×6): 500 mg via ORAL
  Filled 2020-06-05 (×6): qty 1

## 2020-06-05 MED ORDER — ONDANSETRON HCL 4 MG PO TABS: 4.0000 mg | ORAL_TABLET | Freq: Four times a day (QID) | ORAL | Status: DC | PRN

## 2020-06-05 MED ORDER — ACETAMINOPHEN 650 MG RE SUPP: 650.0000 mg | Freq: Four times a day (QID) | RECTAL | Status: DC | PRN

## 2020-06-05 MED ORDER — CHLORHEXIDINE GLUCONATE CLOTH 2 % EX PADS
6.0000 | MEDICATED_PAD | Freq: Every day | CUTANEOUS | Status: DC
  Administered 2020-06-06 – 2020-06-08 (×3): 6 via TOPICAL

## 2020-06-05 MED ORDER — ACETAMINOPHEN 325 MG PO TABS
650.0000 mg | ORAL_TABLET | Freq: Four times a day (QID) | ORAL | Status: DC | PRN
  Administered 2020-06-05 – 2020-06-06 (×2): 650 mg via ORAL
  Filled 2020-06-05 (×2): qty 2

## 2020-06-05 NOTE — ED Triage Notes (Signed)
BIB ems from [facility]. unwitnessed Fall x4 in 48hrs. Wheelchair bound at baseline. Abrasion to left elbow. Hx dementia.

## 2020-06-05 NOTE — H&P (Signed)
History and Physical   [name] [name]:[id number] DOB: [birth year] DOA: 06/05/2020  Referring MD/NP/PA: Gust Brooms, PA  PCP: Celene Squibb, MD   Patient coming from: [facility]  Chief Complaint: Recurrent falls  HPI: [name] [name] is a 87 y.o. female with medical history significant of chronic kidney disease stage III, coronary artery disease, bladder cancer who has had recurrent falls at the facility.  She has had at least 4 episodes of fall in the last couple of days.  Patient is wheelchair-bound.  She was apparently trying to tie off her socks while she was eating on the side of the commode when she fell forward.  She is struck her head after falling.  No pain in the area.  Patient has been monitoring her brother who died recently.  Patient was seen and evaluated in the ER.  No chest pain no shortness of breath.  The nursing home is unable to continue taking care of the patient.  She is fully awake and communicating.  She was found to have small subdural hematoma.  Neurosurgery was consulted.  They recommended admission for observation and repeat CT tomorrow.  Patient has no focal findings..  ED Course: Temperature 97/7, blood pressure was 140/89 pulse 73 respiratory rate of 18 oxygen sats 96% room air.  White count is 7.2 hemoglobin 11.4 and platelets 192.  Sodium 146 potassium 4.4 chloride 111 CO2 24 BUN 35 creatinine 2.60 calcium 9.2.  Glucose 98.  CT cervical spine showed multilevel degenerative changes.  There is a mixed density left: Hemisphere fluid collection about 5 mm in maximum dimension consistent with acute on chronic subdural hematoma with no mass-effect.  Patient being admitted to the hospital with subdural hematoma.  Review of Systems: As per HPI otherwise 10 point review of systems negative.    Past Medical History:  Diagnosis Date  . Abnormal gait   . Asthenia   . Back pain   . Bladder cancer ([place])   . CAD (coronary artery disease)   . Chronic pain   . CKD  (chronic kidney disease), stage IV ([place])   . Dizziness   . Dysphagia   . 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 Essential hypertension, benign   . Hypokalemia   . Insomnia   . Iron deficiency   . Macular degeneration   . Mixed hyperlipidemia   . Obesity   . Postsurgical aortocoronary bypass status     Past Surgical History:  Procedure Laterality Date  . CATARACT EXTRACTION    . CORONARY ARTERY BYPASS GRAFT    . IRIDECTOMY    . NEPHRECTOMY  2000     reports that she has never smoked. She has never used smokeless tobacco. She reports that she does not drink alcohol and does not use drugs.  Allergies  Allergen Reactions  . Adhesive [Tape] Other (See Comments)    Tears patients skin off.   [name] [Cephalexin] Nausea And Vomiting  . Latex Itching  . Penicillins Hives and Itching  . Sulfa Antibiotics Hives and Itching    Family History  Problem Relation Age of Onset  . Colon cancer Mother   . Heart attack Father   . Diabetes Father   . Stroke Other   . Coronary artery disease Other   . Cancer Other   . Diabetes Other      Prior to Admission medications   Medication Sig Start Date End Date Taking? Authorizing Provider  acetaminophen (TYLENOL) 325  MG tablet Take 650 mg by mouth every 8 (eight) hours. With hydrocodone    [provider]  amLODipine (NORVASC) 10 MG tablet Take 1 tablet (10 mg total) by mouth daily. 03/14/20   Shelly Coss, MD  aspirin EC 81 MG tablet Take 81 mg by mouth daily. Swallow whole.    [provider]  clonazePAM (KLONOPIN) 0.5 MG tablet Take 1 tablet (0.5 mg total) by mouth at bedtime. Patient taking differently: Take 0.5 mg by mouth 2 (two) times daily. 08/10/18   Domenic Polite, MD  diphenhydrAMINE (BENADRYL) 25 mg capsule Take 25 mg by mouth every 8 (eight) hours as needed for itching.    [provider]  gabapentin (NEURONTIN) 300 MG capsule Take 1 capsule (300 mg total) by mouth at bedtime. 08/10/18   Domenic Polite, MD  HYDROcodone-acetaminophen (NORCO/VICODIN) 5-325 MG tablet Take 1 tablet by mouth every 8 (eight) hours.    [provider]  levETIRAcetam (KEPPRA) 250 MG tablet Take 250 mg by mouth 2 (two) times daily.     [provider]  levofloxacin (LEVAQUIN) 500 MG tablet Take 1 tablet (500 mg total) by mouth daily. 05/06/20   Isla Pence, MD  linaclotide Rolan Lipa) 290 MCG CAPS capsule Take 290 mcg by mouth daily.    [provider]  mirabegron ER (MYRBETRIQ) 25 MG TB24 tablet Take 25 mg by mouth daily.    [provider]  Multiple Vitamins-Minerals (PRESERVISION AREDS PO) Take 1 capsule by mouth daily.    [provider]  NONFORMULARY OR COMPOUNDED ITEM Apply 1 application topically in the morning and at bedtime. Anti -itch lotion 0.5-0.5% ( Mix with triamcinolone)    [provider]  polyethylene glycol (MIRALAX / GLYCOLAX) packet Take 17 g by mouth every other day. As needed for constipation.    [provider]  Pumpkin Seed-Soy Germ (AZO BLADDER CONTROL/GO-LESS) CAPS Take 1 capsule by mouth daily.     [provider]  rosuvastatin (CRESTOR) 10 MG tablet Take 10 mg by mouth daily.  01/06/11   [provider]  sodium bicarbonate 650 MG tablet Take 1 tablet (650 mg total) by mouth 2 (two) times daily. 03/13/20   Shelly Coss, MD  sodium zirconium cyclosilicate (LOKELMA) 10 g PACK packet Take 10 g by mouth See admin instructions. Takes twice weekly on Monday and Thursday    [provider]  Suvorexant (BELSOMRA) 10 MG TABS Take 10 mg by mouth at bedtime.    [provider]  tizanidine (ZANAFLEX) 2 MG capsule Take 2 mg by mouth 2 (two) times daily as needed for muscle spasms.    [provider]  triamcinolone cream (KENALOG) 0.1 % Apply 1 application topically 2 (two) times daily. Mix with Sarna lotion    [provider]  Vibegron (GEMTESA) 75 MG TABS Take 75 mg by mouth daily.     [provider]    Physical Exam: Vitals:   06/05/20 1730 06/05/20 1830 06/05/20 1900 06/05/20 1930  BP: (!) 159/70 (!) 148/89 (!) 165/76 (!) 177/77  Pulse: 66 68 68 72  Resp: 18 13 13 16   Temp:      TempSrc:      SpO2: 98% 97% 97% 99%  Weight:      Height:          Constitutional: Chronically ill looking, mildly confused, multiple bruises Vitals:   06/05/20 1730 06/05/20 1830 06/05/20 1900 06/05/20 1930  BP: (!) 159/70 (!) 148/89 (!) 165/76 Marland Kitchen)  177/77  Pulse: 66 68 68 72  Resp: 18 13 13 16   Temp:      TempSrc:      SpO2: 98% 97% 97% 99%  Weight:      Height:       Eyes: PERRL, lids and conjunctivae normal ENMT: Mucous membranes are moist. Posterior pharynx clear of any exudate or lesions.Normal dentition.  Neck: normal, supple, no masses, no thyromegaly Respiratory: clear to auscultation bilaterally, no wheezing, no crackles. Normal respiratory effort. No accessory muscle use.  Cardiovascular: Regular rate and rhythm, no murmurs / rubs / gallops. No extremity edema. 2+ pedal pulses. No carotid bruits.  Abdomen: no tenderness, no masses palpated. No hepatosplenomegaly. Bowel sounds positive.  Musculoskeletal: no clubbing / cyanosis. No joint deformity upper and lower extremities. Good ROM, no contractures. Normal muscle tone.  Skin: Multiple bruises Neurologic: CN 2-12 grossly intact. Sensation intact, DTR normal. Strength 5/5 in all 4.  Psychiatric: Normal judgment and insight. Alert and oriented x 3. Normal mood.     Labs on Admission: I have personally reviewed following labs and imaging studies  CBC: Recent Labs  Lab 06/05/20 1445  WBC 7.2  NEUTROABS 3.7  HGB 11.4*  HCT 36.3  MCV 91.2  PLT 045   Basic Metabolic Panel: Recent Labs  Lab 06/05/20 1445  NA 146*  K 4.4  CL 111  CO2 24  GLUCOSE 98  BUN 35*  CREATININE 2.60*  CALCIUM 9.2   GFR: Estimated Creatinine Clearance: 13 mL/min (A) (by C-G formula based on SCr of 2.6 mg/dL  (H)). Liver Function Tests: Recent Labs  Lab 06/05/20 1445  AST 21  ALT 12  ALKPHOS 74  BILITOT 0.7  PROT 6.9  ALBUMIN 3.4*   No results for input(s): LIPASE, AMYLASE in the last 168 hours. No results for input(s): AMMONIA in the last 168 hours. Coagulation Profile: No results for input(s): INR, PROTIME in the last 168 hours. Cardiac Enzymes: No results for input(s): CKTOTAL, CKMB, CKMBINDEX, TROPONINI in the last 168 hours. BNP (last 3 results) No results for input(s): PROBNP in the last 8760 hours. HbA1C: No results for input(s): HGBA1C in the last 72 hours. CBG: No results for input(s): GLUCAP in the last 168 hours. Lipid Profile: No results for input(s): CHOL, HDL, LDLCALC, TRIG, CHOLHDL, LDLDIRECT in the last 72 hours. Thyroid Function Tests: No results for input(s): TSH, T4TOTAL, FREET4, T3FREE, THYROIDAB in the last 72 hours. Anemia Panel: No results for input(s): VITAMINB12, FOLATE, FERRITIN, TIBC, IRON, RETICCTPCT in the last 72 hours. Urine analysis:    Component Value Date/Time   COLORURINE YELLOW 05/06/2020 1430   APPEARANCEUR CLOUDY (A) 05/06/2020 1430   LABSPEC 1.012 05/06/2020 1430   PHURINE 7.0 05/06/2020 1430   GLUCOSEU NEGATIVE 05/06/2020 1430   HGBUR MODERATE (A) 05/06/2020 1430   BILIRUBINUR NEGATIVE 05/06/2020 1430   KETONESUR 5 (A) 05/06/2020 1430   PROTEINUR 100 (A) 05/06/2020 1430   UROBILINOGEN 0.2 06/11/2013 2102   NITRITE NEGATIVE 05/06/2020 1430   LEUKOCYTESUR MODERATE (A) 05/06/2020 1430   Sepsis Labs: @LABRCNTIP (procalcitonin:4,lacticidven:4) )No results found for this or any previous visit (from the past 240 hour(s)).   Radiological Exams on Admission: DG Chest 2 View  Result Date: 06/05/2020 CLINICAL DATA:  Fall. EXAM: CHEST - 2 VIEW COMPARISON:  05/06/2020 FINDINGS: The heart size and mediastinal contours are within normal limits. There is atelectasis at the left lung base and potentially a small left pleural effusion. There is no  evidence of pulmonary edema, consolidation  or pneumothorax. The visualized skeletal structures are unremarkable. IMPRESSION: Left basilar atelectasis and possible small left pleural effusion. Electronically Signed   By: Aletta Edouard M.D.   On: 06/05/2020 16:02   CT Head Wo Contrast  Result Date: 06/05/2020 CLINICAL DATA:  Unwitnessed fall x4 over 48 hours EXAM: CT HEAD WITHOUT CONTRAST CT CERVICAL SPINE WITHOUT CONTRAST TECHNIQUE: Multidetector CT imaging of the head and cervical spine was performed following the standard protocol without intravenous contrast. Multiplanar CT image reconstructions of the cervical spine were also generated. COMPARISON:  CT head and C-spine October 10 2019 FINDINGS: CT HEAD FINDINGS Brain: Mixed density left whole hemispheric fluid collection measuring 5 mm in maximum dimension on coronal image 46. No evidence of acute large vascular territory infarction, hydrocephalus, or mass lesion/mass effect. Mild age related atrophy and chronic microvascular ischemic change. Right basal gangliar lacunar type infarct. Vascular: No hyperdense vessel. Atherosclerotic calcification of the internal carotid and vertebral arteries. Skull: Prior right parietal craniotomy.  No acute abnormality. Sinuses/Orbits: No acute finding. Other: None CT CERVICAL SPINE FINDINGS Alignment: Mild positional straightening of the normal cervical lordosis. No acute listhesis. Skull base and vertebrae: No acute fracture. No primary bone lesion or focal pathologic process. Soft tissues and spinal canal: No prevertebral fluid or swelling. No visible canal hematoma. Disc levels: Similar mild multilevel degenerative changes spine. Partial bony fusion at C2-C3. Upper chest: Left upper lobe calcified granuloma. Other: Bilateral carotid calcifications. IMPRESSION: 1. Mixed density left whole hemispheric fluid collection measuring 5 mm in maximum dimension, most consistent with an acute on chronic subdural hematoma. No  significant mass effect. 2. No evidence of acute fracture or traumatic listhesis of the cervical spine. 3. Similar mild multilevel degenerative changes of the cervical spine. These results were called by telephone at the time of interpretation on 06/05/2020 at 4:39 pm to provider [facility] , who verbally acknowledged these results. Electronically Signed   By: Dahlia Bailiff MD   On: 06/05/2020 16:43   CT Cervical Spine Wo Contrast  Result Date: 06/05/2020 CLINICAL DATA:  Unwitnessed fall x4 over 48 hours EXAM: CT HEAD WITHOUT CONTRAST CT CERVICAL SPINE WITHOUT CONTRAST TECHNIQUE: Multidetector CT imaging of the head and cervical spine was performed following the standard protocol without intravenous contrast. Multiplanar CT image reconstructions of the cervical spine were also generated. COMPARISON:  CT head and C-spine October 10 2019 FINDINGS: CT HEAD FINDINGS Brain: Mixed density left whole hemispheric fluid collection measuring 5 mm in maximum dimension on coronal image 46. No evidence of acute large vascular territory infarction, hydrocephalus, or mass lesion/mass effect. Mild age related atrophy and chronic microvascular ischemic change. Right basal gangliar lacunar type infarct. Vascular: No hyperdense vessel. Atherosclerotic calcification of the internal carotid and vertebral arteries. Skull: Prior right parietal craniotomy.  No acute abnormality. Sinuses/Orbits: No acute finding. Other: None CT CERVICAL SPINE FINDINGS Alignment: Mild positional straightening of the normal cervical lordosis. No acute listhesis. Skull base and vertebrae: No acute fracture. No primary bone lesion or focal pathologic process. Soft tissues and spinal canal: No prevertebral fluid or swelling. No visible canal hematoma. Disc levels: Similar mild multilevel degenerative changes spine. Partial bony fusion at C2-C3. Upper chest: Left upper lobe calcified granuloma. Other: Bilateral carotid calcifications. IMPRESSION: 1. Mixed density  left whole hemispheric fluid collection measuring 5 mm in maximum dimension, most consistent with an acute on chronic subdural hematoma. No significant mass effect. 2. No evidence of acute fracture or traumatic listhesis of the cervical spine. 3. Similar mild  multilevel degenerative changes of the cervical spine. These results were called by telephone at the time of interpretation on 06/05/2020 at 4:39 pm to provider [facility] , who verbally acknowledged these results. Electronically Signed   By: Dahlia Bailiff MD   On: 06/05/2020 16:43   VAS [place] LOWER EXTREMITY VENOUS (DVT) (ONLY [facility] & [facility] 7a-7p)  Result Date: 06/05/2020  Lower Venous DVT Study Indications: Swelling.  Risk Factors: None identified. Limitations: Poor ultrasound/tissue interface and patient positioning. Comparison Study: No prior studies. Performing Technologist: Oliver Hum RVT  Examination Guidelines: A complete evaluation includes B-mode imaging, spectral Doppler, color Doppler, and power Doppler as needed of all accessible portions of each vessel. Bilateral testing is considered an integral part of a complete examination. Limited examinations for reoccurring indications may be performed as noted. The reflux portion of the exam is performed with the patient in reverse Trendelenburg.  +---------+---------------+---------+-----------+----------+--------------+ RIGHT    CompressibilityPhasicitySpontaneityPropertiesThrombus Aging +---------+---------------+---------+-----------+----------+--------------+ CFV      Full           Yes      Yes                                 +---------+---------------+---------+-----------+----------+--------------+ SFJ      Full                                                        +---------+---------------+---------+-----------+----------+--------------+ FV Prox  Full                                                         +---------+---------------+---------+-----------+----------+--------------+ FV Mid   Full                                                        +---------+---------------+---------+-----------+----------+--------------+ FV DistalFull                                                        +---------+---------------+---------+-----------+----------+--------------+ PFV      Full                                                        +---------+---------------+---------+-----------+----------+--------------+ POP      Full           Yes      Yes                                 +---------+---------------+---------+-----------+----------+--------------+ PTV      Full                                                        +---------+---------------+---------+-----------+----------+--------------+  PERO     Full                                                        +---------+---------------+---------+-----------+----------+--------------+   +----+---------------+---------+-----------+----------+--------------+ LEFTCompressibilityPhasicitySpontaneityPropertiesThrombus Aging +----+---------------+---------+-----------+----------+--------------+ CFV Full           Yes      Yes                                 +----+---------------+---------+-----------+----------+--------------+     Summary: RIGHT: - There is no evidence of deep vein thrombosis in the lower extremity.  - No cystic structure found in the popliteal fossa.  LEFT: - No evidence of common femoral vein obstruction.  *See table(s) above for measurements and observations. Electronically signed by Curt Jews MD on 06/05/2020 at 7:15:09 PM.    Final     Assessment/Plan Principal Problem:   Traumatic subdural hematoma ([place]) Active Problems:   Mixed hyperlipidemia   Benign hypertension   CAD (coronary artery disease)   CKD (chronic kidney disease), stage IV ([place])   Falls frequently     #1 traumatic  subdural hematoma: Patient will be admitted.  Monitor closely with neuro checks.  Repeat head CT in the morning.  If no significant change patient will likely be distended with no further treatment.  Neurosurgery already involved.  #2 recurrent falls: Patient will need extensive PT and OT.  Will need to go to a higher level of care.  She currently resides at the long-term care facility with PT.  She needs skilled level care.  #3 benign essential hypertension: Confirm on resume home regimen.  #4 mixed hyperlipidemia: Confirm on resume home regimen  #5 coronary artery disease: Stable.  Continue to monitor  #6 chronic kidney disease stage IV: Appears to be at baseline   DVT prophylaxis: SCD Code Status: DNR Family Communication: No family at bedside Disposition Plan: To skilled facility Consults called: [facility] Admission status: Inpatient  Severity of Illness: The appropriate patient status for this patient is INPATIENT. Inpatient status is judged to be reasonable and necessary in order to provide the required intensity of service to ensure the patient's safety. The patient's presenting symptoms, physical exam findings, and initial radiographic and laboratory data in the context of their chronic comorbidities is felt to place them at high risk for further clinical deterioration. Furthermore, it is not anticipated that the patient will be medically stable for discharge from the hospital within 2 midnights of admission. The following factors support the patient status of inpatient.   " The patient's presenting symptoms include fall. " The worrisome physical exam findings include subdural hematoma on CT with generalized weakness. " The initial radiographic and laboratory data are worrisome because of abnormal CT with subdural hematoma. " The chronic co-morbidities include gait abnormalities.   * I certify that at the point of admission it is my clinical  judgment that the patient will require inpatient hospital care spanning beyond 2 midnights from the point of admission due to high intensity of service, high risk for further deterioration and high frequency of surveillance required.Barbette Merino MD [facility] Pager [phone]  If 7PM-7AM, please contact night-coverage [email or web address] Password [facility]  06/05/2020, 8:17 PM

## 2020-06-05 NOTE — Consult Note (Addendum)
Chief Complaint   Chief Complaint  Patient presents with  . Fall    HPI   Consult requested by: Corinna Capra, EDP [facility] Reason for consult: SDH  HPI: [name] [name] is a 87 y.o. female with multiple medical comorbidities who presented to the ED after a fall. Per patient, she was trying to get up off the commode when she lost her balance and fell forward striking head. No LOC. As part of work up a head CT was obtained which revealed a small left SDH. A NSY consultation was requested. Patient states she has fallen multiple times over the last several days. She attributes the falls to stress from the passing of her younger brother several days ago. She currently feels well. She denies HA, dizziness, changes in vision (blind right eye - chronic), N/T/W. She takes ASA 81mg  daily but otherwise not on any blood thinning medications.  Patient Active Problem List   Diagnosis Date Noted  . Recurrent UTI 03/11/2020  . Exudative age-related macular degeneration of right eye with inactive choroidal neovascularization ([place]) 12/20/2019  . Degenerative retinal drusen of both eyes 12/20/2019  . Exudative age-related macular degeneration of left eye with inactive choroidal neovascularization ([place]) 12/20/2019  . Traumatic subdural hematoma ([place]) 08/08/2018  . Pyelonephritis 09/09/2016  . Falls frequently 06/24/2016  . Tension headache 11/10/2013  . Chest pain 11/03/2011  . ARF (acute renal failure) ([place]) 04/30/2011  . UTI (urinary tract infection) 04/30/2011  . CKD (chronic kidney disease), stage IV ([place]) 04/30/2011  . Anemia 04/30/2011  . S/p nephrectomy 04/30/2011  . Macular degeneration 04/30/2011  . Cataract 04/30/2011  . Bladder cancer ([place]) 04/30/2011  . Weakness generalized 04/29/2011  . Seizure disorder ([place]) 04/29/2011  . Benign hypertension 04/29/2011  . CAD (coronary artery disease) 04/29/2011  . Hyperlipidemia 04/29/2011  . Anxiety 04/29/2011  . Dizziness - light-headed 04/29/2011   . Chronic abdominal pain 04/29/2011  . Mixed hyperlipidemia 05/24/2008  . Essential hypertension, benign 05/24/2008  . Postsurgical aortocoronary bypass status 05/24/2008    PMH: Past Medical History:  Diagnosis Date  . Abnormal gait   . Asthenia   . Back pain   . Bladder cancer ([place])   . CAD (coronary artery disease)   . Chronic pain   . CKD (chronic kidney disease), stage IV ([place])   . Dizziness   . Dysphagia   . Epileptic grand mal status ([place])   . Essential hypertension, benign   . Hypokalemia   . Insomnia   . Iron deficiency   . Macular degeneration   . Mixed hyperlipidemia   . Obesity   . Postsurgical aortocoronary bypass status     PSH: Past Surgical History:  Procedure Laterality Date  . CATARACT EXTRACTION    . CORONARY ARTERY BYPASS GRAFT    . IRIDECTOMY    . NEPHRECTOMY  2000    (Not in a hospital admission)   SH: Social History   Tobacco Use  . Smoking status: Never Smoker  . Smokeless tobacco: Never Used  Vaping Use  . Vaping Use: Never used  Substance Use Topics  . Alcohol use: No  . Drug use: No    MEDS: Prior to Admission medications   Medication Sig Start Date End Date Taking? Authorizing Provider  acetaminophen (TYLENOL) 325 MG tablet Take 650 mg by mouth every 8 (eight) hours. With hydrocodone    [provider]  amLODipine (NORVASC) 10 MG tablet Take 1 tablet (10 mg total) by mouth daily. 03/14/20  Shelly Coss, MD  aspirin EC 81 MG tablet Take 81 mg by mouth daily. Swallow whole.    [provider]  clonazePAM (KLONOPIN) 0.5 MG tablet Take 1 tablet (0.5 mg total) by mouth at bedtime. Patient taking differently: Take 0.5 mg by mouth 2 (two) times daily. 08/10/18   Domenic Polite, MD  diphenhydrAMINE (BENADRYL) 25 mg capsule Take 25 mg by mouth every 8 (eight) hours as needed for itching.    [provider]  gabapentin (NEURONTIN) 300 MG capsule Take 1 capsule (300 mg total) by mouth at bedtime. 08/10/18    Domenic Polite, MD  HYDROcodone-acetaminophen (NORCO/VICODIN) 5-325 MG tablet Take 1 tablet by mouth every 8 (eight) hours.    [provider]  levETIRAcetam (KEPPRA) 250 MG tablet Take 250 mg by mouth 2 (two) times daily.     [provider]  levofloxacin (LEVAQUIN) 500 MG tablet Take 1 tablet (500 mg total) by mouth daily. 05/06/20   Isla Pence, MD  linaclotide Rolan Lipa) 290 MCG CAPS capsule Take 290 mcg by mouth daily.    [provider]  mirabegron ER (MYRBETRIQ) 25 MG TB24 tablet Take 25 mg by mouth daily.    [provider]  Multiple Vitamins-Minerals (PRESERVISION AREDS PO) Take 1 capsule by mouth daily.    [provider]  NONFORMULARY OR COMPOUNDED ITEM Apply 1 application topically in the morning and at bedtime. Anti -itch lotion 0.5-0.5% ( Mix with triamcinolone)    [provider]  polyethylene glycol (MIRALAX / GLYCOLAX) packet Take 17 g by mouth every other day. As needed for constipation.    [provider]  Pumpkin Seed-Soy Germ (AZO BLADDER CONTROL/GO-LESS) CAPS Take 1 capsule by mouth daily.     [provider]  rosuvastatin (CRESTOR) 10 MG tablet Take 10 mg by mouth daily.  01/06/11   [provider]  sodium bicarbonate 650 MG tablet Take 1 tablet (650 mg total) by mouth 2 (two) times daily. 03/13/20   Shelly Coss, MD  sodium zirconium cyclosilicate (LOKELMA) 10 g PACK packet Take 10 g by mouth See admin instructions. Takes twice weekly on Monday and Thursday    [provider]  Suvorexant (BELSOMRA) 10 MG TABS Take 10 mg by mouth at bedtime.    [provider]  tizanidine (ZANAFLEX) 2 MG capsule Take 2 mg by mouth 2 (two) times daily as needed for muscle spasms.    [provider]  triamcinolone cream (KENALOG) 0.1 % Apply 1 application topically 2 (two) times daily. Mix with Sarna lotion    [provider]  Vibegron (GEMTESA) 75 MG TABS Take 75 mg by mouth  daily.    [provider]    ALLERGY: Allergies  Allergen Reactions  . Adhesive [Tape] Other (See Comments)    Tears patients skin off.   [name] [Cephalexin] Nausea And Vomiting  . Latex Itching  . Penicillins Hives and Itching  . Sulfa Antibiotics Hives and Itching    Social History   Tobacco Use  . Smoking status: Never Smoker  . Smokeless tobacco: Never Used  Substance Use Topics  . Alcohol use: No     Family History  Problem Relation Age of Onset  . Colon cancer Mother   . Heart attack Father   . Diabetes Father   . Stroke Other   . Coronary artery disease Other   . Cancer Other   . Diabetes Other      ROS   Review of Systems  All other systems reviewed and are negative.   Exam   Vitals:   06/05/20 1720 06/05/20 1730  BP: (!) 151/58 (!) 159/70  Pulse: 64 66  Resp: 16 18  Temp:    SpO2: 98% 98%   General appearance: WDWN, NAD GCS 15 Eyes: No scleral injection Cardiovascular: Regular rate and rhythm without murmurs, rubs, gallops. No edema or variciosities. Distal pulses normal. Pulmonary: Effort normal, non-labored breathing Musculoskeletal:     Muscle tone upper extremities: Normal    Muscle tone lower extremities: Normal    Motor exam: Upper Extremities Deltoid Bicep Tricep Grip  Right 5/5 5/5 5/5 5/5  Left 5/5 5/5 5/5 5/5   Lower Extremity IP Quad PF DF EHL  Right 5/5 5/5 5/5 5/5 5/5  Left 5/5 5/5 5/5 5/5 5/5   Neurological Mental Status:    - Patient is awake, alert, oriented to person, place, month, year, and situation    - Patient is able to give a clear and coherent history.    - No signs of aphasia or neglect Cranial Nerves    - II: Visual Fields are full. Left pupil reactice    - III/IV/VI: EOMI +ptosis right eye, but able to open weakly (chronic)    - V: Facial sensation is grossly normal    - VII: Facial movement is symmetric.     - VIII: hearing is intact to voice    - X: Uvula elevates symmetrically    - XI:  Shoulder shrug is symmetric.    - XII: tongue is midline without atrophy or fasciculations.  Sensory: Sensation grossly intact to LT  Results - Imaging/Labs   Results for orders placed or performed during the hospital encounter of 06/05/20 (from the past 48 hour(s))  CBC with Differential     Status: Abnormal   Collection Time: 06/05/20  2:45 PM  Result Value Ref Range   WBC 7.2 4.0 - 10.5 K/uL   RBC 3.98 3.87 - 5.11 MIL/uL   Hemoglobin 11.4 (L) 12.0 - 15.0 g/dL   HCT 36.3 36.0 - 46.0 %   MCV 91.2 80.0 - 100.0 fL   MCH 28.6 26.0 - 34.0 pg   MCHC 31.4 30.0 - 36.0 g/dL   RDW 14.7 11.5 - 15.5 %   Platelets 192 150 - 400 K/uL   nRBC 0.0 0.0 - 0.2 %   Neutrophils Relative % 51 %   Neutro Abs 3.7 1.7 - 7.7 K/uL   Lymphocytes Relative 35 %   Lymphs Abs 2.5 0.7 - 4.0 K/uL   Monocytes Relative 10 %   Monocytes Absolute 0.7 0.1 - 1.0 K/uL   Eosinophils Relative 3 %   Eosinophils Absolute 0.2 0.0 - 0.5 K/uL   Basophils Relative 1 %   Basophils Absolute 0.1 0.0 - 0.1 K/uL   Immature Granulocytes 0 %   Abs Immature Granulocytes 0.02 0.00 - 0.07 K/uL    Comment: Performed at [facility], [place] [address], [place], [place] [address]  Comprehensive metabolic panel     Status: Abnormal   Collection Time: 06/05/20  2:45 PM  Result Value Ref Range   Sodium 146 (H) 135 - 145 mmol/L   Potassium 4.4 3.5 - 5.1 mmol/L   Chloride 111 98 - 111 mmol/L   CO2 24 22 - 32 mmol/L   Glucose, Bld 98 70 - 99 mg/dL    Comment: Glucose reference range applies only to samples taken after fasting for at least 8 hours.  BUN 35 (H) 8 - 23 mg/dL   Creatinine, Ser 2.60 (H) 0.44 - 1.00 mg/dL   Calcium 9.2 8.9 - 10.3 mg/dL   Total Protein 6.9 6.5 - 8.1 g/dL   Albumin 3.4 (L) 3.5 - 5.0 g/dL   AST 21 15 - 41 U/L   ALT 12 0 - 44 U/L   Alkaline Phosphatase 74 38 - 126 U/L   Total Bilirubin 0.7 0.3 - 1.2 mg/dL   GFR, Estimated 17 (L) >60 mL/min    Comment: (NOTE) Calculated using the CKD-EPI  Creatinine Equation (2021)    Anion gap 11 5 - 15    Comment: Performed at [facility], [place] [address], [place], [place] [address]    DG Chest 2 View  Result Date: 06/05/2020 CLINICAL DATA:  Fall. EXAM: CHEST - 2 VIEW COMPARISON:  05/06/2020 FINDINGS: The heart size and mediastinal contours are within normal limits. There is atelectasis at the left lung base and potentially a small left pleural effusion. There is no evidence of pulmonary edema, consolidation or pneumothorax. The visualized skeletal structures are unremarkable. IMPRESSION: Left basilar atelectasis and possible small left pleural effusion. Electronically Signed   By: Aletta Edouard M.D.   On: 06/05/2020 16:02   CT Head Wo Contrast  Result Date: 06/05/2020 CLINICAL DATA:  Unwitnessed fall x4 over 48 hours EXAM: CT HEAD WITHOUT CONTRAST CT CERVICAL SPINE WITHOUT CONTRAST TECHNIQUE: Multidetector CT imaging of the head and cervical spine was performed following the standard protocol without intravenous contrast. Multiplanar CT image reconstructions of the cervical spine were also generated. COMPARISON:  CT head and C-spine October 10 2019 FINDINGS: CT HEAD FINDINGS Brain: Mixed density left whole hemispheric fluid collection measuring 5 mm in maximum dimension on coronal image 46. No evidence of acute large vascular territory infarction, hydrocephalus, or mass lesion/mass effect. Mild age related atrophy and chronic microvascular ischemic change. Right basal gangliar lacunar type infarct. Vascular: No hyperdense vessel. Atherosclerotic calcification of the internal carotid and vertebral arteries. Skull: Prior right parietal craniotomy.  No acute abnormality. Sinuses/Orbits: No acute finding. Other: None CT CERVICAL SPINE FINDINGS Alignment: Mild positional straightening of the normal cervical lordosis. No acute listhesis. Skull base and vertebrae: No acute fracture. No primary bone lesion or focal pathologic process. Soft  tissues and spinal canal: No prevertebral fluid or swelling. No visible canal hematoma. Disc levels: Similar mild multilevel degenerative changes spine. Partial bony fusion at C2-C3. Upper chest: Left upper lobe calcified granuloma. Other: Bilateral carotid calcifications. IMPRESSION: 1. Mixed density left whole hemispheric fluid collection measuring 5 mm in maximum dimension, most consistent with an acute on chronic subdural hematoma. No significant mass effect. 2. No evidence of acute fracture or traumatic listhesis of the cervical spine. 3. Similar mild multilevel degenerative changes of the cervical spine. These results were called by telephone at the time of interpretation on 06/05/2020 at 4:39 pm to provider [facility] , who verbally acknowledged these results. Electronically Signed   By: Dahlia Bailiff MD   On: 06/05/2020 16:43   CT Cervical Spine Wo Contrast  Result Date: 06/05/2020 CLINICAL DATA:  Unwitnessed fall x4 over 48 hours EXAM: CT HEAD WITHOUT CONTRAST CT CERVICAL SPINE WITHOUT CONTRAST TECHNIQUE: Multidetector CT imaging of the head and cervical spine was performed following the standard protocol without intravenous contrast. Multiplanar CT image reconstructions of the cervical spine were also generated. COMPARISON:  CT head and C-spine October 10 2019 FINDINGS: CT HEAD FINDINGS Brain: Mixed density left whole hemispheric fluid  collection measuring 5 mm in maximum dimension on coronal image 46. No evidence of acute large vascular territory infarction, hydrocephalus, or mass lesion/mass effect. Mild age related atrophy and chronic microvascular ischemic change. Right basal gangliar lacunar type infarct. Vascular: No hyperdense vessel. Atherosclerotic calcification of the internal carotid and vertebral arteries. Skull: Prior right parietal craniotomy.  No acute abnormality. Sinuses/Orbits: No acute finding. Other: None CT CERVICAL SPINE FINDINGS Alignment: Mild positional straightening of the normal  cervical lordosis. No acute listhesis. Skull base and vertebrae: No acute fracture. No primary bone lesion or focal pathologic process. Soft tissues and spinal canal: No prevertebral fluid or swelling. No visible canal hematoma. Disc levels: Similar mild multilevel degenerative changes spine. Partial bony fusion at C2-C3. Upper chest: Left upper lobe calcified granuloma. Other: Bilateral carotid calcifications. IMPRESSION: 1. Mixed density left whole hemispheric fluid collection measuring 5 mm in maximum dimension, most consistent with an acute on chronic subdural hematoma. No significant mass effect. 2. No evidence of acute fracture or traumatic listhesis of the cervical spine. 3. Similar mild multilevel degenerative changes of the cervical spine. These results were called by telephone at the time of interpretation on 06/05/2020 at 4:39 pm to provider [facility] , who verbally acknowledged these results. Electronically Signed   By: Dahlia Bailiff MD   On: 06/05/2020 16:43   VAS [place] LOWER EXTREMITY VENOUS (DVT) (ONLY [facility] & [facility] 7a-7p)  Result Date: 06/05/2020  Lower Venous DVT Study Indications: Swelling.  Risk Factors: None identified. Limitations: Poor ultrasound/tissue interface and patient positioning. Comparison Study: No prior studies. Performing Technologist: Oliver Hum RVT  Examination Guidelines: A complete evaluation includes B-mode imaging, spectral Doppler, color Doppler, and power Doppler as needed of all accessible portions of each vessel. Bilateral testing is considered an integral part of a complete examination. Limited examinations for reoccurring indications may be performed as noted. The reflux portion of the exam is performed with the patient in reverse Trendelenburg.  +---------+---------------+---------+-----------+----------+--------------+ RIGHT    CompressibilityPhasicitySpontaneityPropertiesThrombus Aging +---------+---------------+---------+-----------+----------+--------------+  CFV      Full           Yes      Yes                                 +---------+---------------+---------+-----------+----------+--------------+ SFJ      Full                                                        +---------+---------------+---------+-----------+----------+--------------+ FV Prox  Full                                                        +---------+---------------+---------+-----------+----------+--------------+ FV Mid   Full                                                        +---------+---------------+---------+-----------+----------+--------------+ FV DistalFull                                                        +---------+---------------+---------+-----------+----------+--------------+  PFV      Full                                                        +---------+---------------+---------+-----------+----------+--------------+ POP      Full           Yes      Yes                                 +---------+---------------+---------+-----------+----------+--------------+ PTV      Full                                                        +---------+---------------+---------+-----------+----------+--------------+ PERO     Full                                                        +---------+---------------+---------+-----------+----------+--------------+   +----+---------------+---------+-----------+----------+--------------+ LEFTCompressibilityPhasicitySpontaneityPropertiesThrombus Aging +----+---------------+---------+-----------+----------+--------------+ CFV Full           Yes      Yes                                 +----+---------------+---------+-----------+----------+--------------+     Summary: RIGHT: - There is no evidence of deep vein thrombosis in the lower extremity.  - No cystic structure found in the popliteal fossa.  LEFT: - No evidence of common femoral vein obstruction.  *See table(s) above  for measurements and observations.    Preliminary     Impression/Plan   87 y.o. female with multiple falls over the last several days found to have an acute on chronic left holohemipsheric SDH with max thickness 79mm. There is no mass effect or MLS. She is at neurologic baseline. There is no role for NS intervention for this SDH. Hopefully this will resolve with time. Rec admission for observation and repeat CT in the am for monitoring. If stable, patient can be discharged from our perspective.   - Keppra 500 mg BID x7days for routine seizure prophylaxis  - hold ASA in setting of SDH - q 4 hour neuro checks  Of note, I did have a long discussion with the patient about the unlikely event that this SDH should worsen to the point of requiring surgery. Patient is adamantly against surgical intervention should this worsen. I attempted to call patient's sister, Archie Patten, but was unsuccessful.  Please call for any concerns. Will follow up on repeat CT tomorrow am.  Ferne Reus, [facility]

## 2020-06-05 NOTE — Progress Notes (Signed)
Right lower extremity venous duplex has been completed. Preliminary results can be found in CV Proc through chart review.  Results were given to Dr. Maryan Rued.  06/05/20 4:49 PM [name] [name] RVT

## 2020-06-05 NOTE — Progress Notes (Signed)
Noted spoke with patients sister and clinical update given.[name] [name]

## 2020-06-05 NOTE — Progress Notes (Signed)
..   Transition of Care [facility]) - Emergency Department Mini Assessment   Patient Details  Name: [name] [name] MRN: [id number] Date of Birth: 28-Jul-1928  [facility] of [facility]) CM/SW Contact:    Jaeden [place] C Tarpley-Carter, [place] Phone Number: 06/05/2020, 2:47 PM   Clinical Narrative: [facility] CM/CSW spoke with [facility] at [place] [phone].  Pt can return to ALF with HH.  HH is with [facility].  [facility] is located at [address], [place], [place] [address].  TOC CM/RN will be notified.  Charlena Haub Tarpley-Carter, MSW, LCSW-A Pronouns:  She, Her, [place] ED Transitions of CareClinical Social Worker [email or web address] [phone]    ED Mini Assessment: What brought you to the Emergency Department? : Fall  Barriers to Discharge: No Barriers Identified     Means of departure: Ambulance  Interventions which prevented an admission or readmission: SNF Placement    Patient Contact and Communications Key Contact 1: [place] with: Laurie Panda Date: 06/05/20,     Contact Phone Number: [phone]    Patient states their goals for this hospitalization and ongoing recovery are:: To obtain PT and OT to assist with rehab.   Choice offered to / list presented to : NA  Admission diagnosis:  fall Patient Active Problem List   Diagnosis Date Noted  . Recurrent UTI 03/11/2020  . Exudative age-related macular degeneration of right eye with inactive choroidal neovascularization ([place]) 12/20/2019  . Degenerative retinal drusen of both eyes 12/20/2019  . Exudative age-related macular degeneration of left eye with inactive choroidal neovascularization ([place]) 12/20/2019  . Traumatic subdural hematoma ([place]) 08/08/2018  . Pyelonephritis 09/09/2016  . Falls frequently 06/24/2016  . Tension headache 11/10/2013  . Chest pain 11/03/2011  . ARF (acute renal failure) ([place]) 04/30/2011  . UTI (urinary tract infection) 04/30/2011  . CKD  (chronic kidney disease), stage IV ([place]) 04/30/2011  . Anemia 04/30/2011  . S/p nephrectomy 04/30/2011  . Macular degeneration 04/30/2011  . Cataract 04/30/2011  . Bladder cancer ([place]) 04/30/2011  . Weakness generalized 04/29/2011  . Seizure disorder ([place]) 04/29/2011  . Benign hypertension 04/29/2011  . CAD (coronary artery disease) 04/29/2011  . Hyperlipidemia 04/29/2011  . Anxiety 04/29/2011  . Dizziness - light-headed 04/29/2011  . Chronic abdominal pain 04/29/2011  . Mixed hyperlipidemia 05/24/2008  . Essential hypertension, benign 05/24/2008  . Postsurgical aortocoronary bypass status 05/24/2008   PCP:  Celene Squibb, MD Pharmacy:   [facility] [place], [place] [address] Phone: [phone] Fax: [phone]

## 2020-06-05 NOTE — ED Notes (Signed)
Pt urinary catheter leg bag changed to foley bag.

## 2020-06-05 NOTE — ED Provider Notes (Signed)
[facility] DEPT Provider Note   CSN: [id number] Arrival date & time: 06/05/20  1401     History Chief Complaint  Patient presents with  . Fall    [name] [name] is a 87 y.o. female.  87 y.o female with an extensive PMH of CKD, CAD, bladder cancer presents to the ED via EMS s/p fall.  According to EMS report, patient has had multiple falls in the past couple days, she is currently wheelchair-bound.  Patient does states she was trying to pull off her socks while she was eating on side of the commode, when she fell forward, does report striking her head however does not have any pain to the area.  Does report she recently found out that her baby brother had passed away, and has been more upset than usual.  History was obtained from nursing home staff who reported they're unable to care for patient as she is had multiple falls in the last couple days.  Patient denies any chest pain, shortness of breath, other complaints.   The history is provided by medical records, a caregiver and the patient.       Past Medical History:  Diagnosis Date  . Abnormal gait   . Asthenia   . Back pain   . Bladder cancer ([place])   . CAD (coronary artery disease)   . Chronic pain   . CKD (chronic kidney disease), stage IV ([place])   . Dizziness   . Dysphagia   . 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 Essential hypertension, benign   . Hypokalemia   . Insomnia   . Iron deficiency   . Macular degeneration   . Mixed hyperlipidemia   . Obesity   . Postsurgical aortocoronary bypass status     Patient Active Problem List   Diagnosis Date Noted  . SDH (subdural hematoma) ([place]) 06/05/2020  . Recurrent UTI 03/11/2020  . Exudative age-related macular degeneration of right eye with inactive choroidal neovascularization ([place]) 12/20/2019  . Degenerative retinal drusen of both eyes 12/20/2019  . Exudative age-related macular degeneration of left eye with inactive choroidal  neovascularization ([place]) 12/20/2019  . Traumatic subdural hematoma ([place]) 08/08/2018  . Pyelonephritis 09/09/2016  . Falls frequently 06/24/2016  . Tension headache 11/10/2013  . Chest pain 11/03/2011  . ARF (acute renal failure) ([place]) 04/30/2011  . UTI (urinary tract infection) 04/30/2011  . CKD (chronic kidney disease), stage IV ([place]) 04/30/2011  . Anemia 04/30/2011  . S/p nephrectomy 04/30/2011  . Macular degeneration 04/30/2011  . Cataract 04/30/2011  . Bladder cancer ([place]) 04/30/2011  . Weakness generalized 04/29/2011  . Seizure disorder ([place]) 04/29/2011  . Benign hypertension 04/29/2011  . CAD (coronary artery disease) 04/29/2011  . Hyperlipidemia 04/29/2011  . Anxiety 04/29/2011  . Dizziness - light-headed 04/29/2011  . Chronic abdominal pain 04/29/2011  . Mixed hyperlipidemia 05/24/2008  . Essential hypertension, benign 05/24/2008  . Postsurgical aortocoronary bypass status 05/24/2008    Past Surgical History:  Procedure Laterality Date  . CATARACT EXTRACTION    . CORONARY ARTERY BYPASS GRAFT    . IRIDECTOMY    . NEPHRECTOMY  2000     OB History    Gravida  3   Para  3   Term  3   Preterm      AB      Living  3     SAB      IAB      Ectopic  Multiple      Live Births              Family History  Problem Relation Age of Onset  . Colon cancer Mother   . Heart attack Father   . Diabetes Father   . Stroke Other   . Coronary artery disease Other   . Cancer Other   . Diabetes Other     Social History   Tobacco Use  . Smoking status: Never Smoker  . Smokeless tobacco: Never Used  Vaping Use  . Vaping Use: Never used  Substance Use Topics  . Alcohol use: No  . Drug use: No    Home Medications Prior to Admission medications   Medication Sig Start Date End Date Taking? Authorizing Provider  acetaminophen (TYLENOL) 325 MG tablet Take 650 mg by mouth every 8 (eight) hours. With hydrocodone    [provider]   amLODipine (NORVASC) 10 MG tablet Take 1 tablet (10 mg total) by mouth daily. 03/14/20   Shelly Coss, MD  aspirin EC 81 MG tablet Take 81 mg by mouth daily. Swallow whole.    [provider]  clonazePAM (KLONOPIN) 0.5 MG tablet Take 1 tablet (0.5 mg total) by mouth at bedtime. Patient taking differently: Take 0.5 mg by mouth 2 (two) times daily. 08/10/18   Domenic Polite, MD  diphenhydrAMINE (BENADRYL) 25 mg capsule Take 25 mg by mouth every 8 (eight) hours as needed for itching.    [provider]  gabapentin (NEURONTIN) 300 MG capsule Take 1 capsule (300 mg total) by mouth at bedtime. 08/10/18   Domenic Polite, MD  HYDROcodone-acetaminophen (NORCO/VICODIN) 5-325 MG tablet Take 1 tablet by mouth every 8 (eight) hours.    [provider]  levETIRAcetam (KEPPRA) 250 MG tablet Take 250 mg by mouth 2 (two) times daily.     [provider]  levofloxacin (LEVAQUIN) 500 MG tablet Take 1 tablet (500 mg total) by mouth daily. 05/06/20   Isla Pence, MD  linaclotide Rolan Lipa) 290 MCG CAPS capsule Take 290 mcg by mouth daily.    [provider]  mirabegron ER (MYRBETRIQ) 25 MG TB24 tablet Take 25 mg by mouth daily.    [provider]  Multiple Vitamins-Minerals (PRESERVISION AREDS PO) Take 1 capsule by mouth daily.    [provider]  NONFORMULARY OR COMPOUNDED ITEM Apply 1 application topically in the morning and at bedtime. Anti -itch lotion 0.5-0.5% ( Mix with triamcinolone)    [provider]  polyethylene glycol (MIRALAX / GLYCOLAX) packet Take 17 g by mouth every other day. As needed for constipation.    [provider]  Pumpkin Seed-Soy Germ (AZO BLADDER CONTROL/GO-LESS) CAPS Take 1 capsule by mouth daily.     [provider]  rosuvastatin (CRESTOR) 10 MG tablet Take 10 mg by mouth daily.  01/06/11   [provider]  sodium bicarbonate 650 MG tablet Take 1 tablet (650 mg total) by mouth 2 (two) times  daily. 03/13/20   Shelly Coss, MD  sodium zirconium cyclosilicate (LOKELMA) 10 g PACK packet Take 10 g by mouth See admin instructions. Takes twice weekly on Monday and Thursday    [provider]  Suvorexant (BELSOMRA) 10 MG TABS Take 10 mg by mouth at bedtime.    [provider]  tizanidine (ZANAFLEX) 2 MG capsule Take 2 mg by mouth 2 (two) times daily as needed for muscle spasms.    [provider]  triamcinolone cream (KENALOG) 0.1 % Apply  1 application topically 2 (two) times daily. Mix with Sarna lotion    [provider]  Vibegron (GEMTESA) 75 MG TABS Take 75 mg by mouth daily.    [provider]    Allergies    Adhesive [tape], Keflet [cephalexin], Latex, Penicillins, and Sulfa antibiotics  Review of Systems   Review of Systems  Constitutional: Negative for fever.  HENT: Negative for sore throat.   Respiratory: Negative for shortness of breath.   Cardiovascular: Negative for chest pain.  Gastrointestinal: Negative for abdominal pain.  Genitourinary: Negative for flank pain.  Musculoskeletal: Negative for back pain.  Skin: Negative for pallor and wound.  Neurological: Positive for weakness.  All other systems reviewed and are negative.   Physical Exam Updated Vital Signs BP (!) 148/89   Pulse 68   Temp 97.7 F (36.5 C) (Oral)   Resp 13   Ht 5\' 2"  (1.575 m)   Wt 65 kg   SpO2 97%   BMI 26.21 kg/m   Physical Exam Vitals and nursing note reviewed.  Constitutional:      Appearance: Normal appearance.  HENT:     Head: Normocephalic and atraumatic.     Nose: Nose normal.  Eyes:     Pupils: Pupils are equal, round, and reactive to light.  Cardiovascular:     Rate and Rhythm: Normal rate.     Comments: Swelling to right lower extremity.  Pulmonary:     Effort: Pulmonary effort is normal.     Breath sounds: No wheezing or rales.  Abdominal:     General: Abdomen is flat.     Palpations: Abdomen is soft.      Tenderness: There is no abdominal tenderness.  Musculoskeletal:     Cervical back: Normal range of motion and neck supple.     Right lower leg: Edema present.  Skin:    General: Skin is warm and dry.  Neurological:     Mental Status: She is alert. Mental status is at baseline.     ED Results / Procedures / Treatments   Labs (all labs ordered are listed, but only abnormal results are displayed) Labs Reviewed  CBC WITH DIFFERENTIAL/PLATELET - Abnormal; Notable for the following components:      Result Value   Hemoglobin 11.4 (*)    All other components within normal limits  COMPREHENSIVE METABOLIC PANEL - Abnormal; Notable for the following components:   Sodium 146 (*)    BUN 35 (*)    Creatinine, Ser 2.60 (*)    Albumin 3.4 (*)    GFR, Estimated 17 (*)    All other components within normal limits    EKG EKG Interpretation  Date/Time:  Tuesday June 05 2020 14:50:00 EST Ventricular Rate:  66 PR Interval:    QRS Duration: 96 QT Interval:  445 QTC Calculation: 467 R Axis:   41 Text Interpretation: Sinus rhythm Low voltage, precordial leads No significant change since last tracing Confirmed by Blanchie Dessert ([id number]) on 06/05/2020 3:11:27 PM   Radiology DG Chest 2 View  Result Date: 06/05/2020 CLINICAL DATA:  Fall. EXAM: CHEST - 2 VIEW COMPARISON:  05/06/2020 FINDINGS: The heart size and mediastinal contours are within normal limits. There is atelectasis at the left lung base and potentially a small left pleural effusion. There is no evidence of pulmonary edema, consolidation or pneumothorax. The visualized skeletal structures are unremarkable. IMPRESSION: Left basilar atelectasis and possible small left pleural effusion. Electronically Signed   By: Jenness Corner.D.  On: 06/05/2020 16:02   CT Head Wo Contrast  Result Date: 06/05/2020 CLINICAL DATA:  Unwitnessed fall x4 over 48 hours EXAM: CT HEAD WITHOUT CONTRAST CT CERVICAL SPINE WITHOUT CONTRAST TECHNIQUE:  Multidetector CT imaging of the head and cervical spine was performed following the standard protocol without intravenous contrast. Multiplanar CT image reconstructions of the cervical spine were also generated. COMPARISON:  CT head and C-spine October 10 2019 FINDINGS: CT HEAD FINDINGS Brain: Mixed density left whole hemispheric fluid collection measuring 5 mm in maximum dimension on coronal image 46. No evidence of acute large vascular territory infarction, hydrocephalus, or mass lesion/mass effect. Mild age related atrophy and chronic microvascular ischemic change. Right basal gangliar lacunar type infarct. Vascular: No hyperdense vessel. Atherosclerotic calcification of the internal carotid and vertebral arteries. Skull: Prior right parietal craniotomy.  No acute abnormality. Sinuses/Orbits: No acute finding. Other: None CT CERVICAL SPINE FINDINGS Alignment: Mild positional straightening of the normal cervical lordosis. No acute listhesis. Skull base and vertebrae: No acute fracture. No primary bone lesion or focal pathologic process. Soft tissues and spinal canal: No prevertebral fluid or swelling. No visible canal hematoma. Disc levels: Similar mild multilevel degenerative changes spine. Partial bony fusion at C2-C3. Upper chest: Left upper lobe calcified granuloma. Other: Bilateral carotid calcifications. IMPRESSION: 1. Mixed density left whole hemispheric fluid collection measuring 5 mm in maximum dimension, most consistent with an acute on chronic subdural hematoma. No significant mass effect. 2. No evidence of acute fracture or traumatic listhesis of the cervical spine. 3. Similar mild multilevel degenerative changes of the cervical spine. These results were called by telephone at the time of interpretation on 06/05/2020 at 4:39 pm to provider [facility] , who verbally acknowledged these results. Electronically Signed   By: Dahlia Bailiff MD   On: 06/05/2020 16:43   CT Cervical Spine Wo Contrast  Result  Date: 06/05/2020 CLINICAL DATA:  Unwitnessed fall x4 over 48 hours EXAM: CT HEAD WITHOUT CONTRAST CT CERVICAL SPINE WITHOUT CONTRAST TECHNIQUE: Multidetector CT imaging of the head and cervical spine was performed following the standard protocol without intravenous contrast. Multiplanar CT image reconstructions of the cervical spine were also generated. COMPARISON:  CT head and C-spine October 10 2019 FINDINGS: CT HEAD FINDINGS Brain: Mixed density left whole hemispheric fluid collection measuring 5 mm in maximum dimension on coronal image 46. No evidence of acute large vascular territory infarction, hydrocephalus, or mass lesion/mass effect. Mild age related atrophy and chronic microvascular ischemic change. Right basal gangliar lacunar type infarct. Vascular: No hyperdense vessel. Atherosclerotic calcification of the internal carotid and vertebral arteries. Skull: Prior right parietal craniotomy.  No acute abnormality. Sinuses/Orbits: No acute finding. Other: None CT CERVICAL SPINE FINDINGS Alignment: Mild positional straightening of the normal cervical lordosis. No acute listhesis. Skull base and vertebrae: No acute fracture. No primary bone lesion or focal pathologic process. Soft tissues and spinal canal: No prevertebral fluid or swelling. No visible canal hematoma. Disc levels: Similar mild multilevel degenerative changes spine. Partial bony fusion at C2-C3. Upper chest: Left upper lobe calcified granuloma. Other: Bilateral carotid calcifications. IMPRESSION: 1. Mixed density left whole hemispheric fluid collection measuring 5 mm in maximum dimension, most consistent with an acute on chronic subdural hematoma. No significant mass effect. 2. No evidence of acute fracture or traumatic listhesis of the cervical spine. 3. Similar mild multilevel degenerative changes of the cervical spine. These results were called by telephone at the time of interpretation on 06/05/2020 at 4:39 pm to provider [facility] , who  verbally  acknowledged these results. Electronically Signed   By: Dahlia Bailiff MD   On: 06/05/2020 16:43   VAS [place] LOWER EXTREMITY VENOUS (DVT) (ONLY [facility] & [facility] 7a-7p)  Result Date: 06/05/2020  Lower Venous DVT Study Indications: Swelling.  Risk Factors: None identified. Limitations: Poor ultrasound/tissue interface and patient positioning. Comparison Study: No prior studies. Performing Technologist: Oliver Hum RVT  Examination Guidelines: A complete evaluation includes B-mode imaging, spectral Doppler, color Doppler, and power Doppler as needed of all accessible portions of each vessel. Bilateral testing is considered an integral part of a complete examination. Limited examinations for reoccurring indications may be performed as noted. The reflux portion of the exam is performed with the patient in reverse Trendelenburg.  +---------+---------------+---------+-----------+----------+--------------+ RIGHT    CompressibilityPhasicitySpontaneityPropertiesThrombus Aging +---------+---------------+---------+-----------+----------+--------------+ CFV      Full           Yes      Yes                                 +---------+---------------+---------+-----------+----------+--------------+ SFJ      Full                                                        +---------+---------------+---------+-----------+----------+--------------+ FV Prox  Full                                                        +---------+---------------+---------+-----------+----------+--------------+ FV Mid   Full                                                        +---------+---------------+---------+-----------+----------+--------------+ FV DistalFull                                                        +---------+---------------+---------+-----------+----------+--------------+ PFV      Full                                                         +---------+---------------+---------+-----------+----------+--------------+ POP      Full           Yes      Yes                                 +---------+---------------+---------+-----------+----------+--------------+ PTV      Full                                                        +---------+---------------+---------+-----------+----------+--------------+  PERO     Full                                                        +---------+---------------+---------+-----------+----------+--------------+   +----+---------------+---------+-----------+----------+--------------+ LEFTCompressibilityPhasicitySpontaneityPropertiesThrombus Aging +----+---------------+---------+-----------+----------+--------------+ CFV Full           Yes      Yes                                 +----+---------------+---------+-----------+----------+--------------+     Summary: RIGHT: - There is no evidence of deep vein thrombosis in the lower extremity.  - No cystic structure found in the popliteal fossa.  LEFT: - No evidence of common femoral vein obstruction.  *See table(s) above for measurements and observations.    Preliminary     Procedures Procedures   Medications Ordered in ED Medications  levETIRAcetam (KEPPRA) tablet 500 mg (has no administration in time range)    ED Course  I have reviewed the triage vital signs and the nursing notes.  Pertinent labs & imaging results that were available during my care of the patient were reviewed by me and considered in my medical decision making (see chart for details).    MDM Rules/Calculators/A&P  Patient arrive into the ED via EMS s/p fall at facility, according to patient she was using the commode.  EMS report along with triage note indicates that patient has dementia, when I personally interviewed patient, she is able to recall events along with feeling weaker lately as she had a recent family member passed away.  She does report she  was responding to remove her socks when the episode occurred.  There is swelling to the right lower extremity, lungs are clear to auscultation, abdomen is soft and nontender to palpation.  Denies any chest pain, shortness of breath, loss of consciousness.  She denies striking her head.  As history of dementia was mentioned, will call [facility] in order to obtain further history from nursing staff.   2:28 PM Call placed to [facility], spoke to [employer], patient was found on the floor, she fell on the floor per roommate, and this morning fell again. Family arrived that baby brother had passed away yesterday and was told he is new since then she has been more shook up.  We are unable to meet her needs, "shes on the floor more than she is on the bed" does have a history of prior CKD 3, states that patient will likely need skilled facility versus a nursing home as they are unable to provide care for her.  Basic screening work-up was ordered for patient.  CBC without any leukocytosis, hemoglobin slightly decreased.  CMP without electrolyte normality, her creatinine level is 2.6, this is at patient's baseline.  Oxygen saturation has been 97% on room air without any signs of tachypnea or tachycardia.  Gust care patient with my attending Dr. Maryan Rued who also evaluated patient and recommends ultrasound to rule out any DVT due to her right leg swelling, this is negative in nature.  She is wheelchair-bound currently not anticoagulated.  CT of her cervical spine without any acute findings.  4:45 PM Called received from radiology who reports patient has an acute on chronic of subdural hematoma.   CT Head  showed:  1. Mixed density left whole hemispheric fluid collection measuring 5  mm in maximum dimension, most consistent with an acute on chronic  subdural hematoma. No significant mass effect.  2. No evidence of acute fracture or traumatic listhesis of the  cervical spine.  3. Similar mild multilevel  degenerative changes of the cervical  spine.    These results were called by telephone at the time of interpretation  on 06/05/2020 at 4:39 pm to provider [facility] , who verbally  acknowledged these results.     5:26 PM These results were called to neurosurgery in order to obtain further recommendations.  Vinny PA, to evaluate patient in the next hour.  He does recommend admission to [facility] long, along with repeat CT in the morning.  Spoke to hospitalist Dr. Jonelle Sidle who agreed of admitting patient for further management and care.  Patient has remained stable.   Portions of this note were generated with [employer]. Dictation errors may occur despite best attempts at proofreading.  Final Clinical Impression(s) / ED Diagnoses Final diagnoses:  Fall, initial encounter  Subdural hematoma [facility])    Rx / DC Orders ED Discharge Orders    None       Janeece Fitting, Hershal Coria 06/05/20 1914    Blanchie Dessert, MD 06/09/20 2151

## 2020-06-06 ENCOUNTER — Inpatient Hospital Stay (HOSPITAL_COMMUNITY): Payer: Medicare Other

## 2020-06-06 DIAGNOSIS — S065X0A Traumatic subdural hemorrhage without loss of consciousness, initial encounter: Secondary | ICD-10-CM | POA: Diagnosis not present

## 2020-06-06 DIAGNOSIS — N184 Chronic kidney disease, stage 4 (severe): Secondary | ICD-10-CM | POA: Diagnosis not present

## 2020-06-06 DIAGNOSIS — I10 Essential (primary) hypertension: Secondary | ICD-10-CM | POA: Diagnosis not present

## 2020-06-06 LAB — COMPREHENSIVE METABOLIC PANEL
ALT: 11 U/L (ref 0–44)
AST: 19 U/L (ref 15–41)
Albumin: 3 g/dL — ABNORMAL LOW (ref 3.5–5.0)
Alkaline Phosphatase: 69 U/L (ref 38–126)
Anion gap: 8 (ref 5–15)
BUN: 35 mg/dL — ABNORMAL HIGH (ref 8–23)
CO2: 23 mmol/L (ref 22–32)
Calcium: 8.6 mg/dL — ABNORMAL LOW (ref 8.9–10.3)
Chloride: 109 mmol/L (ref 98–111)
Creatinine, Ser: 2.55 mg/dL — ABNORMAL HIGH (ref 0.44–1.00)
GFR, Estimated: 18 mL/min — ABNORMAL LOW (ref >60)
Glucose, Bld: 136 mg/dL — ABNORMAL HIGH (ref 70–99)
Potassium: 4 mmol/L (ref 3.5–5.1)
Sodium: 140 mmol/L (ref 135–145)
Total Bilirubin: 0.6 mg/dL (ref 0.3–1.2)
Total Protein: 6.2 g/dL — ABNORMAL LOW (ref 6.5–8.1)

## 2020-06-06 LAB — CBC
HCT: 33.3 % — ABNORMAL LOW (ref 36.0–46.0)
Hemoglobin: 10.4 g/dL — ABNORMAL LOW (ref 12.0–15.0)
MCH: 28.3 pg (ref 26.0–34.0)
MCHC: 31.2 g/dL (ref 30.0–36.0)
MCV: 90.7 fL (ref 80.0–100.0)
Platelets: 182 10*3/uL (ref 150–400)
RBC: 3.67 MIL/uL — ABNORMAL LOW (ref 3.87–5.11)
RDW: 14.6 % (ref 11.5–15.5)
WBC: 6 10*3/uL (ref 4.0–10.5)
nRBC: 0 % (ref 0.0–0.2)

## 2020-06-06 MED ORDER — VIBEGRON 75 MG PO TABS: 75.0000 mg | ORAL_TABLET | Freq: Every day | ORAL | Status: DC

## 2020-06-06 MED ORDER — SUVOREXANT 10 MG PO TABS: 10.0000 mg | ORAL_TABLET | Freq: Every day | ORAL | Status: DC

## 2020-06-06 MED ORDER — ZOLPIDEM TARTRATE 5 MG PO TABS
5.0000 mg | ORAL_TABLET | Freq: Every evening | ORAL | Status: DC | PRN
  Administered 2020-06-06: 5 mg via ORAL
  Filled 2020-06-06: qty 1

## 2020-06-06 MED ORDER — ROSUVASTATIN CALCIUM 10 MG PO TABS
10.0000 mg | ORAL_TABLET | Freq: Every day | ORAL | Status: DC
  Administered 2020-06-07 – 2020-06-08 (×2): 10 mg via ORAL
  Filled 2020-06-06 (×2): qty 1

## 2020-06-06 MED ORDER — GABAPENTIN 300 MG PO CAPS
300.0000 mg | ORAL_CAPSULE | Freq: Every day | ORAL | Status: DC
  Administered 2020-06-06 – 2020-06-07 (×2): 300 mg via ORAL
  Filled 2020-06-06 (×2): qty 1

## 2020-06-06 MED ORDER — AMLODIPINE BESYLATE 5 MG PO TABS
2.5000 mg | ORAL_TABLET | Freq: Every day | ORAL | Status: DC
  Administered 2020-06-06 – 2020-06-08 (×3): 2.5 mg via ORAL
  Filled 2020-06-06 (×3): qty 1

## 2020-06-06 MED ORDER — HYDROCODONE-ACETAMINOPHEN 5-325 MG PO TABS
1.0000 | ORAL_TABLET | Freq: Four times a day (QID) | ORAL | Status: DC | PRN
  Administered 2020-06-06 – 2020-06-07 (×3): 1 via ORAL
  Filled 2020-06-06 (×3): qty 1

## 2020-06-06 MED ORDER — CLONAZEPAM 0.5 MG PO TABS
0.5000 mg | ORAL_TABLET | Freq: Every day | ORAL | Status: DC
  Administered 2020-06-06 – 2020-06-07 (×2): 0.5 mg via ORAL
  Filled 2020-06-06 (×2): qty 1

## 2020-06-06 NOTE — Plan of Care (Signed)
ON  going assessment , continue with current plan of care

## 2020-06-06 NOTE — Progress Notes (Signed)
Repeat CTH reviewed, no change in SDH. Would hold ASA for now, can f/u in outpatient NS clinic in 4 weeks.

## 2020-06-06 NOTE — Progress Notes (Addendum)
[facility] PROGRESS NOTE   [name] [name] ION:[id number] DOB: Jan 21, 1929 DOA: 06/05/2020  PCP: Celene Squibb, MD  Brief History/Interval Summary: 87 y.o. female with medical history significant of chronic kidney disease stage III, coronary artery disease, bladder cancer with chronic indwelling Foley catheter, seizure disorder, who has had recurrent falls at her assisted living facility.  The last time she fell she apparently hit her head on the floor.  She was sent over for further evaluation.  She was found to have a subdural hematoma.  Neurosurgery was consulted.  She was hospitalized.  Reason for Visit: Subdural hematoma  Consultants: Neurosurgery  Procedures: None yet  Antibiotics: Anti-infectives (From admission, onward)   None      Subjective/Interval History: Patient complains of pain on her right side of the head.  Denies any vision problems.  No nausea or vomiting.  Somewhat distracted.    Assessment/Plan:  Traumatic subdural hematoma Left-sided fluid collection was noted on CT head.  No mass-effect noted.  Patient seen by neurosurgery.  Plan is to repeat CT head this morning.  Patient does not have any neurological deficits.  She does have a headache.  Patient on Keppra for 7 days for seizure prophylaxis.  History of recurrent falls Patient is wheelchair-bound at baseline.  PT and OT evaluation is pending.  She lives in an assisted living facility.  May need higher level of care.  Known history of seizure disorder Patient was on Keppra prior to admission.  Currently on a higher dose per neurosurgery.  Benign essential hypertension Elevated blood pressures overnight could be due to pain.  Resume her prior to admission medications.  Chronic kidney disease stage IV Baseline creatinine as between 2.5 and 3.2.  Renal function appears to be at baseline.  Monitor urine output.  History of bladder cancer with indwelling Foley catheter Patient with chronic indwelling  Foley catheter with a recurrent history of UTIs.  She has normal WBC and is afebrile.  History of coronary artery disease and hyperlipidemia Stable.  Continue home medications.  Normocytic anemia Likely anemia of chronic disease.  Mild drop in hemoglobin is likely dilutional.  Recently positive COVID-19 test Patient tested positive for COVID-19 on 04/25/20.  Asymptomatic currently.  Obesity Estimated body mass index is 30.96 kg/m as calculated from the following:   Height as of this encounter: 5' (1.524 m).   Weight as of this encounter: 71.9 kg.   DVT Prophylaxis: SCDs Code Status: DNR Family Communication: No family at bedside Disposition Plan: Wait on PT and OT evaluation.  Status is: Inpatient  Remains inpatient appropriate because:Ongoing diagnostic testing needed not appropriate for outpatient work up and Inpatient level of care appropriate due to severity of illness   Dispo: The patient is from: ALF              Anticipated d/c is to: SNF              Patient currently is not medically stable to d/c.   Difficult to place patient No      Medications:  Scheduled: . Chlorhexidine Gluconate Cloth  6 each Topical Daily  . levETIRAcetam  500 mg Oral BID   Continuous:  KGM:[id number] **OR** acetaminophen, ondansetron **OR** ondansetron (ZOFRAN) IV   Objective:  Vital Signs  Vitals:   06/06/20 0300 06/06/20 0400 06/06/20 0700 06/06/20 0800  BP:   (!) 146/52 (!) 144/56  Pulse:   68 69  Resp:   15 15  Temp: 98 F (36.7  C) 98.2 F (36.8 C)  98.5 F (36.9 C)  TempSrc: Oral Oral  Oral  SpO2:   97% 97%  Weight:      Height:        Intake/Output Summary (Last 24 hours) at 06/06/2020 0852 Last data filed at 06/06/2020 0500 Gross per 24 hour  Intake --  Output 650 ml  Net -650 ml   Filed Weights   06/05/20 1414 06/05/20 2229  Weight: 65 kg 71.9 kg    General appearance: Awake alert.  In no distress.  Mildly distracted Resp: Clear to auscultation  bilaterally.  Normal effort Cardio: S1-S2 is normal regular.  No S3-S4.  No rubs murmurs or bruit GI: Abdomen is soft.  Nontender nondistended.  Bowel sounds are present normal.  No masses organomegaly Extremities: No edema.  Able to move all of her extremities Neurologic:  No focal neurological deficits.    Lab Results:  Data Reviewed: I have personally reviewed following labs and imaging studies  CBC: Recent Labs  Lab 06/05/20 1445 06/06/20 0241  WBC 7.2 6.0  NEUTROABS 3.7  --   HGB 11.4* 10.4*  HCT 36.3 33.3*  MCV 91.2 90.7  PLT 192 098    Basic Metabolic Panel: Recent Labs  Lab 06/05/20 1445 06/06/20 0241  NA 146* 140  K 4.4 4.0  CL 111 109  CO2 24 23  GLUCOSE 98 136*  BUN 35* 35*  CREATININE 2.60* 2.55*  CALCIUM 9.2 8.6*    GFR: Estimated Creatinine Clearance: 13.2 mL/min (A) (by C-G formula based on SCr of 2.55 mg/dL (H)).  Liver Function Tests: Recent Labs  Lab 06/05/20 1445 06/06/20 0241  AST 21 19  ALT 12 11  ALKPHOS 74 69  BILITOT 0.7 0.6  PROT 6.9 6.2*  ALBUMIN 3.4* 3.0*     Recent Results (from the past 240 hour(s))  MRSA PCR Screening     Status: None   Collection Time: 06/05/20  9:46 PM   Specimen: Nasopharyngeal  Result Value Ref Range Status   MRSA by PCR NEGATIVE NEGATIVE Final    Comment:        The GeneXpert MRSA Assay (FDA approved for NASAL specimens only), is one component of a comprehensive MRSA colonization surveillance program. It is not intended to diagnose MRSA infection nor to guide or monitor treatment for MRSA infections. Performed at [facility], [place] [address], [place], [place] [address]       Radiology Studies: DG Chest 2 View  Result Date: 06/05/2020 CLINICAL DATA:  Fall. EXAM: CHEST - 2 VIEW COMPARISON:  05/06/2020 FINDINGS: The heart size and mediastinal contours are within normal limits. There is atelectasis at the left lung base and potentially a small left pleural effusion. There  is no evidence of pulmonary edema, consolidation or pneumothorax. The visualized skeletal structures are unremarkable. IMPRESSION: Left basilar atelectasis and possible small left pleural effusion. Electronically Signed   By: Aletta Edouard M.D.   On: 06/05/2020 16:02   CT Head Wo Contrast  Result Date: 06/05/2020 CLINICAL DATA:  Unwitnessed fall x4 over 48 hours EXAM: CT HEAD WITHOUT CONTRAST CT CERVICAL SPINE WITHOUT CONTRAST TECHNIQUE: Multidetector CT imaging of the head and cervical spine was performed following the standard protocol without intravenous contrast. Multiplanar CT image reconstructions of the cervical spine were also generated. COMPARISON:  CT head and C-spine October 10 2019 FINDINGS: CT HEAD FINDINGS Brain: Mixed density left whole hemispheric fluid collection measuring 5 mm in maximum dimension on coronal image  46. No evidence of acute large vascular territory infarction, hydrocephalus, or mass lesion/mass effect. Mild age related atrophy and chronic microvascular ischemic change. Right basal gangliar lacunar type infarct. Vascular: No hyperdense vessel. Atherosclerotic calcification of the internal carotid and vertebral arteries. Skull: Prior right parietal craniotomy.  No acute abnormality. Sinuses/Orbits: No acute finding. Other: None CT CERVICAL SPINE FINDINGS Alignment: Mild positional straightening of the normal cervical lordosis. No acute listhesis. Skull base and vertebrae: No acute fracture. No primary bone lesion or focal pathologic process. Soft tissues and spinal canal: No prevertebral fluid or swelling. No visible canal hematoma. Disc levels: Similar mild multilevel degenerative changes spine. Partial bony fusion at C2-C3. Upper chest: Left upper lobe calcified granuloma. Other: Bilateral carotid calcifications. IMPRESSION: 1. Mixed density left whole hemispheric fluid collection measuring 5 mm in maximum dimension, most consistent with an acute on chronic subdural hematoma. No  significant mass effect. 2. No evidence of acute fracture or traumatic listhesis of the cervical spine. 3. Similar mild multilevel degenerative changes of the cervical spine. These results were called by telephone at the time of interpretation on 06/05/2020 at 4:39 pm to provider [facility] , who verbally acknowledged these results. Electronically Signed   By: Dahlia Bailiff MD   On: 06/05/2020 16:43   CT Cervical Spine Wo Contrast  Result Date: 06/05/2020 CLINICAL DATA:  Unwitnessed fall x4 over 48 hours EXAM: CT HEAD WITHOUT CONTRAST CT CERVICAL SPINE WITHOUT CONTRAST TECHNIQUE: Multidetector CT imaging of the head and cervical spine was performed following the standard protocol without intravenous contrast. Multiplanar CT image reconstructions of the cervical spine were also generated. COMPARISON:  CT head and C-spine October 10 2019 FINDINGS: CT HEAD FINDINGS Brain: Mixed density left whole hemispheric fluid collection measuring 5 mm in maximum dimension on coronal image 46. No evidence of acute large vascular territory infarction, hydrocephalus, or mass lesion/mass effect. Mild age related atrophy and chronic microvascular ischemic change. Right basal gangliar lacunar type infarct. Vascular: No hyperdense vessel. Atherosclerotic calcification of the internal carotid and vertebral arteries. Skull: Prior right parietal craniotomy.  No acute abnormality. Sinuses/Orbits: No acute finding. Other: None CT CERVICAL SPINE FINDINGS Alignment: Mild positional straightening of the normal cervical lordosis. No acute listhesis. Skull base and vertebrae: No acute fracture. No primary bone lesion or focal pathologic process. Soft tissues and spinal canal: No prevertebral fluid or swelling. No visible canal hematoma. Disc levels: Similar mild multilevel degenerative changes spine. Partial bony fusion at C2-C3. Upper chest: Left upper lobe calcified granuloma. Other: Bilateral carotid calcifications. IMPRESSION: 1. Mixed density  left whole hemispheric fluid collection measuring 5 mm in maximum dimension, most consistent with an acute on chronic subdural hematoma. No significant mass effect. 2. No evidence of acute fracture or traumatic listhesis of the cervical spine. 3. Similar mild multilevel degenerative changes of the cervical spine. These results were called by telephone at the time of interpretation on 06/05/2020 at 4:39 pm to provider [facility] , who verbally acknowledged these results. Electronically Signed   By: Dahlia Bailiff MD   On: 06/05/2020 16:43   VAS [place] LOWER EXTREMITY VENOUS (DVT) (ONLY [facility] & [facility] 7a-7p)  Result Date: 06/05/2020  Lower Venous DVT Study Indications: Swelling.  Risk Factors: None identified. Limitations: Poor ultrasound/tissue interface and patient positioning. Comparison Study: No prior studies. Performing Technologist: Oliver Hum RVT  Examination Guidelines: A complete evaluation includes B-mode imaging, spectral Doppler, color Doppler, and power Doppler as needed of all accessible portions of each vessel. Bilateral testing is  considered an integral part of a complete examination. Limited examinations for reoccurring indications may be performed as noted. The reflux portion of the exam is performed with the patient in reverse Trendelenburg.  +---------+---------------+---------+-----------+----------+--------------+ RIGHT    CompressibilityPhasicitySpontaneityPropertiesThrombus Aging +---------+---------------+---------+-----------+----------+--------------+ CFV      Full           Yes      Yes                                 +---------+---------------+---------+-----------+----------+--------------+ SFJ      Full                                                        +---------+---------------+---------+-----------+----------+--------------+ FV Prox  Full                                                         +---------+---------------+---------+-----------+----------+--------------+ FV Mid   Full                                                        +---------+---------------+---------+-----------+----------+--------------+ FV DistalFull                                                        +---------+---------------+---------+-----------+----------+--------------+ PFV      Full                                                        +---------+---------------+---------+-----------+----------+--------------+ POP      Full           Yes      Yes                                 +---------+---------------+---------+-----------+----------+--------------+ PTV      Full                                                        +---------+---------------+---------+-----------+----------+--------------+ PERO     Full                                                        +---------+---------------+---------+-----------+----------+--------------+   +----+---------------+---------+-----------+----------+--------------+ LEFTCompressibilityPhasicitySpontaneityPropertiesThrombus Aging +----+---------------+---------+-----------+----------+--------------+ CFV Full  Yes      Yes                                 +----+---------------+---------+-----------+----------+--------------+     Summary: RIGHT: - There is no evidence of deep vein thrombosis in the lower extremity.  - No cystic structure found in the popliteal fossa.  LEFT: - No evidence of common femoral vein obstruction.  *See table(s) above for measurements and observations. Electronically signed by Curt Jews MD on 06/05/2020 at 7:15:09 PM.    Final        LOS: 1 day   [facility] Pager on [email or web address]  06/06/2020, 8:52 AM

## 2020-06-07 DIAGNOSIS — S065X0A Traumatic subdural hemorrhage without loss of consciousness, initial encounter: Secondary | ICD-10-CM | POA: Diagnosis not present

## 2020-06-07 DIAGNOSIS — N184 Chronic kidney disease, stage 4 (severe): Secondary | ICD-10-CM | POA: Diagnosis not present

## 2020-06-07 DIAGNOSIS — I10 Essential (primary) hypertension: Secondary | ICD-10-CM | POA: Diagnosis not present

## 2020-06-07 LAB — CBC
HCT: 35.7 % — ABNORMAL LOW (ref 36.0–46.0)
Hemoglobin: 11.1 g/dL — ABNORMAL LOW (ref 12.0–15.0)
MCH: 28.6 pg (ref 26.0–34.0)
MCHC: 31.1 g/dL (ref 30.0–36.0)
MCV: 92 fL (ref 80.0–100.0)
Platelets: 163 10*3/uL (ref 150–400)
RBC: 3.88 MIL/uL (ref 3.87–5.11)
RDW: 14.6 % (ref 11.5–15.5)
WBC: 6.4 10*3/uL (ref 4.0–10.5)
nRBC: 0 % (ref 0.0–0.2)

## 2020-06-07 LAB — BASIC METABOLIC PANEL
Anion gap: 9 (ref 5–15)
BUN: 36 mg/dL — ABNORMAL HIGH (ref 8–23)
CO2: 24 mmol/L (ref 22–32)
Calcium: 9 mg/dL (ref 8.9–10.3)
Chloride: 107 mmol/L (ref 98–111)
Creatinine, Ser: 2.54 mg/dL — ABNORMAL HIGH (ref 0.44–1.00)
GFR, Estimated: 18 mL/min — ABNORMAL LOW (ref >60)
Glucose, Bld: 100 mg/dL — ABNORMAL HIGH (ref 70–99)
Potassium: 4.3 mmol/L (ref 3.5–5.1)
Sodium: 140 mmol/L (ref 135–145)

## 2020-06-07 MED ORDER — HYDROCODONE-ACETAMINOPHEN 5-325 MG PO TABS: 1.0000 | ORAL_TABLET | Freq: Three times a day (TID) | ORAL | 0 refills | Status: DC

## 2020-06-07 MED ORDER — LEVETIRACETAM 250 MG PO TABS: ORAL_TABLET | ORAL | Status: DC

## 2020-06-07 MED ORDER — BELSOMRA 10 MG PO TABS: 10.0000 mg | ORAL_TABLET | Freq: Every day | ORAL | 0 refills | Status: DC

## 2020-06-07 MED ORDER — CLONAZEPAM 0.5 MG PO TABS: 0.5000 mg | ORAL_TABLET | Freq: Every day | ORAL | 0 refills | Status: DC

## 2020-06-07 NOTE — Progress Notes (Signed)
[facility] PROGRESS NOTE   [name] [name] EOF:[id number] DOB: 10-22-1928 DOA: 06/05/2020  PCP: Celene Squibb, MD  Brief History/Interval Summary: 87 y.o. female with medical history significant of chronic kidney disease stage III, coronary artery disease, bladder cancer with chronic indwelling Foley catheter, seizure disorder, who has had recurrent falls at her assisted living facility.  The last time she fell she apparently hit her head on the floor.  She was sent over for further evaluation.  She was found to have a subdural hematoma.  Neurosurgery was consulted.  She was hospitalized.  Reason for Visit: Subdural hematoma  Consultants: Neurosurgery  Procedures: None yet  Antibiotics: Anti-infectives (From admission, onward)   None      Subjective/Interval History: Patient noted to be asleep.  She woke up easily.  States that the pain in her hip and back area is better.  Headache is also improving.  No new complaints.      Assessment/Plan:  Traumatic subdural hematoma Left-sided fluid collection was noted on CT head.  No mass-effect noted.  Patient seen by neurosurgery.  CT head was repeated yesterday which showed stability.  Reviewed by neurosurgery.  They recommend continuing to hold the aspirin.  Follow-up in their office in 4 weeks.  Patient on higher dose of Keppra for 7 days.  History of recurrent falls Patient lives in assisted living facility.  Seen by PT and OT who recommended SNF for short-term rehab.    Known history of seizure disorder Patient was on Keppra prior to admission.  Currently on a higher dose per neurosurgery.  Change back to her usual dose after 7 days.  Benign essential hypertension Elevated blood pressures overnight could be due to pain.  Better today compared to yesterday.    Chronic kidney disease stage IV Baseline creatinine as between 2.5 and 3.2.  Renal function remains at baseline.  Continue to monitor urine output.   History of  bladder cancer with indwelling Foley catheter Patient with chronic indwelling Foley catheter with a recurrent history of UTIs.  She has normal WBC and is afebrile.  History of coronary artery disease and hyperlipidemia Stable.  Continue home medications.  Normocytic anemia Likely anemia of chronic disease.  Mild drop in hemoglobin is likely dilutional.  Hemoglobin noted to be stable.  Recently positive COVID-19 test Patient tested positive for COVID-19 on 04/25/20.  Asymptomatic currently.  Obesity Estimated body mass index is 30.96 kg/m as calculated from the following:   Height as of this encounter: 5' (1.524 m).   Weight as of this encounter: 71.9 kg.   DVT Prophylaxis: SCDs Code Status: DNR Family Communication: No family at bedside Disposition Plan: SNF for short-term rehab  Status is: Inpatient  Remains inpatient appropriate because:Ongoing diagnostic testing needed not appropriate for outpatient work up and Inpatient level of care appropriate due to severity of illness   Dispo: The patient is from: ALF              Anticipated d/c is to: SNF              Patient currently is not medically stable to d/c.   Difficult to place patient No      Medications:  Scheduled: . amLODipine  2.5 mg Oral Daily  . Chlorhexidine Gluconate Cloth  6 each Topical Daily  . clonazePAM  0.5 mg Oral QHS  . gabapentin  300 mg Oral QHS  . levETIRAcetam  500 mg Oral BID  . rosuvastatin  10 mg  Oral Daily  . Vibegron  75 mg Oral Daily   Continuous:  NIO:[id number] **OR** acetaminophen, HYDROcodone-acetaminophen, ondansetron **OR** ondansetron (ZOFRAN) IV, zolpidem   Objective:  Vital Signs  Vitals:   06/06/20 1545 06/06/20 2055 06/06/20 2354 06/07/20 0445  BP: (!) 133/51 (!) 162/81 122/71 (!) 139/58  Pulse: 69 69 70 65  Resp: 17 18 18 20   Temp: 98.4 F (36.9 C) 98.3 F (36.8 C) 97.8 F (36.6 C) (!) 97.4 F (36.3 C)  TempSrc: Oral Oral Oral Oral  SpO2: (!) 89% 98% 94%  93%  Weight:      Height:        Intake/Output Summary (Last 24 hours) at 06/07/2020 1052 Last data filed at 06/07/2020 0500 Gross per 24 hour  Intake -  Output 1300 ml  Net -1300 ml   Filed Weights   06/05/20 1414 06/05/20 2229  Weight: 65 kg 71.9 kg    General appearance: Somnolent but easily arousable.  Mildly distracted Resp: Clear to auscultation bilaterally.  Normal effort Cardio: S1-S2 is normal regular.  No S3-S4.  No rubs murmurs or bruit GI: Abdomen is soft.  Nontender nondistended.  Bowel sounds are present normal.  No masses organomegaly Extremities: Good range of motion of both lower extremities. Neurologic: No focal neurological deficits.     Lab Results:  Data Reviewed: I have personally reviewed following labs and imaging studies  CBC: Recent Labs  Lab 06/05/20 1445 06/06/20 0241 06/07/20 0515  WBC 7.2 6.0 6.4  NEUTROABS 3.7  --   --   HGB 11.4* 10.4* 11.1*  HCT 36.3 33.3* 35.7*  MCV 91.2 90.7 92.0  PLT 192 182 993    Basic Metabolic Panel: Recent Labs  Lab 06/05/20 1445 06/06/20 0241 06/07/20 0515  NA 146* 140 140  K 4.4 4.0 4.3  CL 111 109 107  CO2 24 23 24   GLUCOSE 98 136* 100*  BUN 35* 35* 36*  CREATININE 2.60* 2.55* 2.54*  CALCIUM 9.2 8.6* 9.0    GFR: Estimated Creatinine Clearance: 13.3 mL/min (A) (by C-G formula based on SCr of 2.54 mg/dL (H)).  Liver Function Tests: Recent Labs  Lab 06/05/20 1445 06/06/20 0241  AST 21 19  ALT 12 11  ALKPHOS 74 69  BILITOT 0.7 0.6  PROT 6.9 6.2*  ALBUMIN 3.4* 3.0*     Recent Results (from the past 240 hour(s))  MRSA PCR Screening     Status: None   Collection Time: 06/05/20  9:46 PM   Specimen: Nasopharyngeal  Result Value Ref Range Status   MRSA by PCR NEGATIVE NEGATIVE Final    Comment:        The GeneXpert MRSA Assay (FDA approved for NASAL specimens only), is one component of a comprehensive MRSA colonization surveillance program. It is not intended to diagnose  MRSA infection nor to guide or monitor treatment for MRSA infections. Performed at [facility], [place] [address] [place], [place] [address]       Radiology Studies: DG Chest 2 View  Result Date: 06/05/2020 CLINICAL DATA:  Fall. EXAM: CHEST - 2 VIEW COMPARISON:  05/06/2020 FINDINGS: The heart size and mediastinal contours are within normal limits. There is atelectasis at the left lung base and potentially a small left pleural effusion. There is no evidence of pulmonary edema, consolidation or pneumothorax. The visualized skeletal structures are unremarkable. IMPRESSION: Left basilar atelectasis and possible small left pleural effusion. Electronically Signed   By: Aletta Edouard M.D.   On: 06/05/2020 16:02  CT HEAD WO CONTRAST  Result Date: 06/06/2020 CLINICAL DATA:  Recurrent falls, history chronic kidney disease stage III, coronary artery disease, bladder cancer EXAM: CT HEAD WITHOUT CONTRAST TECHNIQUE: Contiguous axial images were obtained from the base of the skull through the vertex without intravenous contrast. Sagittal and coronal MPR images reconstructed from axial data set. COMPARISON:  06/05/2020 FINDINGS: Brain: Generalized atrophy. Normal ventricular morphology. No midline shift or mass effect. Small vessel chronic ischemic changes of deep cerebral white matter. Again identified thin LEFT hemispheric subdural collection up to 5 mm thick consistent with acute on chronic subdural hematoma. Additional high attenuation hemorrhage along the falx consistent with thin subdural hematoma. No abnormal RIGHT-sided fluid collection identified. No intraparenchymal hemorrhage or acute infarction. 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 Vascular: Atherosclerotic calcification of internal carotid and vertebral arteries at skull base Skull: Prior RIGHT parietal craniotomy. No additional osseous abnormalities. Sinuses/Orbits: Clear Other: N/A IMPRESSION:  Atrophy with small vessel chronic ischemic changes of deep cerebral white matter. 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 Acute on chronic LEFT hemispheric subdural hematoma up to 5 mm thick, unchanged. Additional thin subdural hematoma along the falx, better visualized. No new intraparenchymal abnormalities. Findings called to Dr. Maryland Pink on 06/06/2020 at 1051 hours. Electronically Signed   By: Lavonia Dana M.D.   On: 06/06/2020 10:52   CT Head Wo Contrast  Result Date: 06/05/2020 CLINICAL DATA:  Unwitnessed fall x4 over 48 hours EXAM: CT HEAD WITHOUT CONTRAST CT CERVICAL SPINE WITHOUT CONTRAST TECHNIQUE: Multidetector CT imaging of the head and cervical spine was performed following the standard protocol without intravenous contrast. Multiplanar CT image reconstructions of the cervical spine were also generated. COMPARISON:  CT head and C-spine October 10 2019 FINDINGS: CT HEAD FINDINGS Brain: Mixed density left whole hemispheric fluid collection measuring 5 mm in maximum dimension on coronal image 46. No evidence of acute large vascular territory infarction, hydrocephalus, or mass lesion/mass effect. Mild age related atrophy and chronic microvascular ischemic change. Right basal gangliar lacunar type infarct. Vascular: No hyperdense vessel. Atherosclerotic calcification of the internal carotid and vertebral arteries. Skull: Prior right parietal craniotomy.  No acute abnormality. Sinuses/Orbits: No acute finding. Other: None CT CERVICAL SPINE FINDINGS Alignment: Mild positional straightening of the normal cervical lordosis. No acute listhesis. Skull base and vertebrae: No acute fracture. No primary bone lesion or focal pathologic process. Soft tissues and spinal canal: No prevertebral fluid or swelling. No visible canal hematoma. Disc levels: Similar mild multilevel degenerative changes spine. Partial bony fusion at C2-C3. Upper chest: Left upper lobe calcified granuloma. Other:  Bilateral carotid calcifications. IMPRESSION: 1. Mixed density left whole hemispheric fluid collection measuring 5 mm in maximum dimension, most consistent with an acute on chronic subdural hematoma. No significant mass effect. 2. No evidence of acute fracture or traumatic listhesis of the cervical spine. 3. Similar mild multilevel degenerative changes of the cervical spine. These results were called by telephone at the time of interpretation on 06/05/2020 at 4:39 pm to provider [facility] , who verbally acknowledged these results. Electronically Signed   By: Dahlia Bailiff MD   On: 06/05/2020 16:43   CT Cervical Spine Wo Contrast  Result Date: 06/05/2020 CLINICAL DATA:  Unwitnessed fall x4 over 48 hours EXAM: CT HEAD WITHOUT CONTRAST CT CERVICAL SPINE WITHOUT CONTRAST TECHNIQUE: Multidetector CT imaging of the head and cervical spine was performed following the standard protocol without intravenous contrast. Multiplanar CT image reconstructions of  the cervical spine were also generated. COMPARISON:  CT head and C-spine October 10 2019 FINDINGS: CT HEAD FINDINGS Brain: Mixed density left whole hemispheric fluid collection measuring 5 mm in maximum dimension on coronal image 46. No evidence of acute large vascular territory infarction, hydrocephalus, or mass lesion/mass effect. Mild age related atrophy and chronic microvascular ischemic change. Right basal gangliar lacunar type infarct. Vascular: No hyperdense vessel. Atherosclerotic calcification of the internal carotid and vertebral arteries. Skull: Prior right parietal craniotomy.  No acute abnormality. Sinuses/Orbits: No acute finding. Other: None CT CERVICAL SPINE FINDINGS Alignment: Mild positional straightening of the normal cervical lordosis. No acute listhesis. Skull base and vertebrae: No acute fracture. No primary bone lesion or focal pathologic process. Soft tissues and spinal canal: No prevertebral fluid or swelling. No visible canal hematoma. Disc levels:  Similar mild multilevel degenerative changes spine. Partial bony fusion at C2-C3. Upper chest: Left upper lobe calcified granuloma. Other: Bilateral carotid calcifications. IMPRESSION: 1. Mixed density left whole hemispheric fluid collection measuring 5 mm in maximum dimension, most consistent with an acute on chronic subdural hematoma. No significant mass effect. 2. No evidence of acute fracture or traumatic listhesis of the cervical spine. 3. Similar mild multilevel degenerative changes of the cervical spine. These results were called by telephone at the time of interpretation on 06/05/2020 at 4:39 pm to provider [facility] , who verbally acknowledged these results. Electronically Signed   By: Dahlia Bailiff MD   On: 06/05/2020 16:43   VAS [place] LOWER EXTREMITY VENOUS (DVT) (ONLY [facility] & [facility] 7a-7p)  Result Date: 06/05/2020  Lower Venous DVT Study Indications: Swelling.  Risk Factors: None identified. Limitations: Poor ultrasound/tissue interface and patient positioning. Comparison Study: No prior studies. Performing Technologist: Oliver Hum RVT  Examination Guidelines: A complete evaluation includes B-mode imaging, spectral Doppler, color Doppler, and power Doppler as needed of all accessible portions of each vessel. Bilateral testing is considered an integral part of a complete examination. Limited examinations for reoccurring indications may be performed as noted. The reflux portion of the exam is performed with the patient in reverse Trendelenburg.  +---------+---------------+---------+-----------+----------+--------------+ RIGHT    CompressibilityPhasicitySpontaneityPropertiesThrombus Aging +---------+---------------+---------+-----------+----------+--------------+ CFV      Full           Yes      Yes                                 +---------+---------------+---------+-----------+----------+--------------+ SFJ      Full                                                         +---------+---------------+---------+-----------+----------+--------------+ FV Prox  Full                                                        +---------+---------------+---------+-----------+----------+--------------+ FV Mid   Full                                                        +---------+---------------+---------+-----------+----------+--------------+  FV DistalFull                                                        +---------+---------------+---------+-----------+----------+--------------+ PFV      Full                                                        +---------+---------------+---------+-----------+----------+--------------+ POP      Full           Yes      Yes                                 +---------+---------------+---------+-----------+----------+--------------+ PTV      Full                                                        +---------+---------------+---------+-----------+----------+--------------+ PERO     Full                                                        +---------+---------------+---------+-----------+----------+--------------+   +----+---------------+---------+-----------+----------+--------------+ LEFTCompressibilityPhasicitySpontaneityPropertiesThrombus Aging +----+---------------+---------+-----------+----------+--------------+ CFV Full           Yes      Yes                                 +----+---------------+---------+-----------+----------+--------------+     Summary: RIGHT: - There is no evidence of deep vein thrombosis in the lower extremity.  - No cystic structure found in the popliteal fossa.  LEFT: - No evidence of common femoral vein obstruction.  *See table(s) above for measurements and observations. Electronically signed by Curt Jews MD on 06/05/2020 at 7:15:09 PM.    Final        LOS: 2 days   [facility] Pager on [email or web address]  06/07/2020, 10:52 AM

## 2020-06-07 NOTE — Progress Notes (Signed)
Occupational Therapy Evaluation  Patient resides at ALF, reports unless she asks for help she performs dressing, bathing, toileting herself. States she believes she fell this last time (hx of mult falls) while trying to manage LB clothing and indwelling catheter "I got tangled up." Pt requiring min A for bed mobility and min A for partial stand pivot to recliner chair. Provided assist guiding hips into chair with patient sitting quickly with cues to center self in chair. Recommend continued acute OT services to maximize patient safety, activity tolerance, balance in order to facilitate D/C to venue listed below.    06/07/20 0900  OT Visit Information  Last OT Received On 06/07/20  Assistance Needed +1  PT/OT/SLP Co-Evaluation/Treatment Yes  Reason for Co-Treatment For patient/therapist safety;To address functional/ADL transfers  OT goals addressed during session ADL's and self-care  History of Present Illness 87 y.o. female with medical history significant of chronic kidney disease stage III, coronary artery disease, bladder cancer with chronic indwelling Foley catheter, seizure disorder, who has had recurrent falls at her assisted living facility.  She was found to have a subdural hematoma.  Neurosurgery was consulted, no intervention..  Precautions  Precautions Fall  Precaution Comments blind right eye, low vision left  Restrictions  Weight Bearing Restrictions No  Home Living  Family/patient expects to be discharged to: Assisted living  Home Equipment Wheelchair - manual  Additional Comments from  [facility]  Prior Function  Level of Independence Needs assistance  Gait / Transfers Assistance Needed pt reported she transfers to/from wheelchair unassisted. nonambulatory  ADL's / Homemaking Assistance Needed pt reported she bathes, toilets and dresses herself  Comments per chart, frequent falls at ALF. Pt stated she doesn't receive any assistance except meds  Communication   Communication No difficulties  Pain Assessment  Pain Assessment Faces  Faces Pain Scale 4  Pain Location back  Pain Descriptors / Indicators Aching  Pain Intervention(s) Monitored during session  Cognition  Arousal/Alertness Awake/alert  Behavior During Therapy WFL for tasks assessed/performed  Overall Cognitive Status Within Functional Limits for tasks assessed  General Comments following directions appropriately, thought she was at [facility] otherwise oriented to month, year  Upper Extremity Assessment  Upper Extremity Assessment Generalized weakness  Lower Extremity Assessment  Lower Extremity Assessment Defer to PT evaluation  Cervical / Trunk Assessment  Cervical / Trunk Assessment Kyphotic  ADL  Overall ADL's  Needs assistance/impaired  Eating/Feeding Set up;Sitting  Grooming Set up;Sitting  Upper Body Bathing Set up;Sitting  Lower Body Bathing Sitting/lateral leans;Sit to/from stand;Moderate assistance  Upper Body Dressing  Set up;Sitting  Lower Body Dressing Sitting/lateral leans;Moderate assistance  Toilet Transfer Minimal assistance;BSC;Squat-pivot  Toilet Transfer Details (indicate cue type and reason) to recliner, min A to support L side and guide hips into chair  Toileting- Clothing Manipulation and Hygiene Moderate assistance;Sitting/lateral lean;Sit to/from stand  Toileting - Clothing Manipulation Details (indicate cue type and reason) pt reports fell trying to manage pants and catheter at ALF  Functional mobility during ADLs Minimal assistance  General ADL Comments patient requiring increased assistance for self care due to decreased safety, high fall risk, decreased activity tolerance  Bed Mobility  Overal bed mobility Needs Assistance  Bed Mobility Rolling;Sidelying to Sit  Rolling Min guard  Sidelying to sit Min assist  General bed mobility comments hand guidance to reach rails and roll to side. HHA to pull to sit upright.  Transfers  Overall transfer level Needs  assistance  Equipment used None  Transfers Squat Pivot  Transfers  General transfer comment patient initiates reaching for chair arm to pivot, pt partial stand with  min A to guide hips into chair  Balance  Overall balance assessment History of Falls;Needs assistance  Sitting-balance support Feet supported  Sitting balance-Leahy Scale Fair  Standing balance support During functional activity  Standing balance-Leahy Scale Poor  Standing balance comment reliant on support  OT - End of Session  Activity Tolerance Patient tolerated treatment well  Patient left in chair;with call bell/phone within reach;with chair alarm set  Nurse Communication Mobility status  OT Assessment  OT Recommendation/Assessment Patient needs continued OT Services  OT Visit Diagnosis Other abnormalities of gait and mobility (R26.89);History of falling (Z91.81)  OT Problem List Decreased strength;Decreased activity tolerance;Impaired balance (sitting and/or standing);Decreased safety awareness  OT Plan  OT Frequency (ACUTE ONLY) Min 2X/week  OT Treatment/Interventions (ACUTE ONLY) Self-care/ADL training;DME and/or AE instruction;Therapeutic activities;Patient/family education;Balance training  AM-PAC OT "6 Clicks" Daily Activity Outcome Measure (Version 2)  Help from another person eating meals? 3  Help from another person taking care of personal grooming? 3  Help from another person toileting, which includes using toliet, bedpan, or urinal? 2  Help from another person bathing (including washing, rinsing, drying)? 2  Help from another person to put on and taking off regular upper body clothing? 3  Help from another person to put on and taking off regular lower body clothing? 2  6 Click Score 15  OT Recommendation  Follow Up Recommendations SNF  OT Equipment None recommended by OT  Individuals Consulted  Consulted and Agree with Results and Recommendations Patient  Acute Rehab OT Goals  Patient Stated Goal agreed  to  getting out of bed to recliner  OT Goal Formulation With patient  Time For Goal Achievement 06/21/20  Potential to Achieve Goals Good  OT Time Calculation  OT Start Time (ACUTE ONLY) 0742  OT Stop Time (ACUTE ONLY) 0805  OT Time Calculation (min) 23 min  OT General Charges  $OT Visit 1 Visit  OT Evaluation  $OT Eval Low Complexity 1 Low  Written Expression  Dominant Hand Right   Delbert Phenix OT OT pager: [phone]

## 2020-06-07 NOTE — Evaluation (Signed)
Physical Therapy Evaluation Patient Details Name: [name] [name] MRN: [id number] DOB: [birth year] Today's Date: 06/07/2020   History of Present Illness  87 y.o. female with medical history significant of chronic kidney disease stage III, coronary artery disease, bladder cancer with chronic indwelling Foley catheter, seizure disorder, who has had recurrent falls at her assisted living facility.  She was found to have a subdural hematoma.  Neurosurgery was consulted, no intervention..  Clinical Impression  The patient is very pleasant. Required min  Assist for bed mobility and partial stand and pivot to recliner. Patient is WC dependent for mobility. Patient will benefit from higher level of care with history multiple falls . Pt admitted with above diagnosis.   Pt currently with functional limitations due to the deficits listed below (see PT Problem List). Pt will benefit from skilled PT to increase their independence and safety with mobility to allow discharge to the venue listed below.       Follow Up Recommendations SNF    Equipment Recommendations  None recommended by PT    Recommendations for Other Services       Precautions / Restrictions Precautions Precautions: Fall Precaution Comments: blind right eye, low vision left      Mobility  Bed Mobility Overal bed mobility: Needs Assistance Bed Mobility: Rolling;Sidelying to Sit Rolling: Min guard Sidelying to sit: Min assist       General bed mobility comments: hand guidance to reach rails and roll to side. HHA to pull to sit upright.    Transfers Overall transfer level: Needs assistance   Transfers: Sit to/from Stand;Stand Pivot Transfers;Squat Pivot Transfers Sit to Stand: Min assist   Squat pivot transfers: Min assist     General transfer comment: patient able to reach for chair arm, partially stands and pivots to recliner. Partially stood from recliner for pasd to be placed.  Ambulation/Gait                 Stairs            Wheelchair Mobility    Modified Rankin (Stroke Patients Only)       Balance Overall balance assessment: History of Falls;Needs assistance Sitting-balance support: Feet supported;Bilateral upper extremity supported Sitting balance-Leahy Scale: Fair     Standing balance support: During functional activity Standing balance-Leahy Scale: Poor Standing balance comment: reliant on support                             Pertinent Vitals/Pain Pain Assessment: Faces Faces Pain Scale: Hurts little more Pain Location: back Pain Descriptors / Indicators: Aching Pain Intervention(s): Monitored during session;Limited activity within patient's tolerance    Home Living Family/patient expects to be discharged to:: Assisted living               Home Equipment: Wheelchair - manual Additional Comments: from  [facility]    Prior Function Level of Independence: Needs assistance   Gait / Transfers Assistance Needed: pt reported she transfers to/from wheelchair unassisted. nonambulatory  ADL's / Homemaking Assistance Needed: pt reported she bathes/dresses herself  Comments: per chart, frequent falls at ALF. Pt stated she doesn't receive any assistance except meds     Hand Dominance        Extremity/Trunk Assessment        Lower Extremity Assessment Lower Extremity Assessment: Generalized weakness    Cervical / Trunk Assessment Cervical / Trunk Assessment: Kyphotic  Communication   Communication: No difficulties  Cognition Arousal/Alertness: Awake/alert Behavior During Therapy: WFL for tasks assessed/performed Overall Cognitive Status: Within Functional Limits for tasks assessed                                 General Comments: oriented to [facility] and year      General Comments      Exercises     Assessment/Plan    PT Assessment Patient needs continued PT services  PT Problem List Decreased strength;Decreased  mobility;Decreased safety awareness;Decreased activity tolerance;Decreased balance;Decreased knowledge of use of DME;Pain;Decreased knowledge of precautions       PT Treatment Interventions Functional mobility training;Therapeutic activities;Therapeutic exercise;Patient/family education;Wheelchair mobility training    PT Goals (Current goals can be found in the Care Plan section)  Acute Rehab PT Goals Patient Stated Goal: agreed to  getting out of bed to recliner PT Goal Formulation: With patient Time For Goal Achievement: 06/21/20 Potential to Achieve Goals: Good    Frequency Min 2X/week   Barriers to discharge        Co-evaluation PT/OT/SLP Co-Evaluation/Treatment: Yes Reason for Co-Treatment: For patient/therapist safety PT goals addressed during session: Mobility/safety with mobility OT goals addressed during session: ADL's and self-care       AM-PAC PT "6 Clicks" Mobility  Outcome Measure Help needed turning from your back to your side while in a flat bed without using bedrails?: A Little Help needed moving from lying on your back to sitting on the side of a flat bed without using bedrails?: A Little Help needed moving to and from a bed to a chair (including a wheelchair)?: A Lot Help needed standing up from a chair using your arms (e.g., wheelchair or bedside chair)?: A Lot Help needed to walk in hospital room?: Total Help needed climbing 3-5 steps with a railing? : Total 6 Click Score: 12    End of Session   Activity Tolerance: Patient tolerated treatment well Patient left: in chair;with call bell/phone within reach;with chair alarm set Nurse Communication: Mobility status PT Visit Diagnosis: History of falling (Z91.81)    Time: 2956-2130 PT Time Calculation (min) (ACUTE ONLY): 21 min   Charges:   PT Evaluation $PT Eval Low Complexity: [place] Pager [phone] Office [phone]   Claretha Cooper 06/07/2020, 8:31 AM

## 2020-06-07 NOTE — Discharge Instructions (Signed)
Subdural Hematoma  A subdural hematoma is a collection of blood between the brain and its outer covering (dura). As the amount of blood increases, pressure builds on the brain. There are two types of subdural hematomas:  Acute. This type develops shortly after a hard, direct hit to the head and causes blood to collect very quickly. This is a medical emergency. If it is not diagnosed and treated quickly, it can lead to severe brain injury or death.  Chronic. This is when bleeding develops more slowly, over weeks or months. In some cases, this type does not cause symptoms. What are the causes? This condition is caused by bleeding (hemorrhage) from a broken (ruptured) blood vessel. In most cases, a blood vessel ruptures and bleeds because of a head injury, such as from a hard, direct hit. Head injuries can happen in car accidents, falls, assaults, or while playing sports. In rare cases, a hemorrhage can happen without a known cause (spontaneously), especially if you take blood thinners (anticoagulants). What increases the risk? This condition is more likely to develop in:  Older people.  Infants.  People who take blood thinners.  People who have head injuries.  People who abuse alcohol. What are the signs or symptoms? Symptoms of this condition can vary depending on the size of the hematoma. Symptoms can be mild, severe, or life-threatening. They include:  Headaches.  Nausea or vomiting.  Changes in vision, such as double vision or loss of vision.  Changes in speech or trouble understanding what people say.  Loss of balance or trouble walking.  Weakness, numbness, or tingling in the arms or legs, especially on one side of the body.  Seizures.  Change in personality.  Increased sleepiness.  Memory loss.  Loss of consciousness.  Coma. Symptoms of acute subdural hematoma can develop over minutes or hours. Symptoms of chronic subdural hematoma may develop over weeks or  months. How is this diagnosed? This condition is diagnosed based on the results of:  A physical exam.  Tests of strength, reflexes, coordination, senses, manner of walking (gait), and facial and eye movements (neurological exam).  Imaging tests, such as an MRI or a CT scan. How is this treated? Treatment for this condition depends on the type of hematoma and how severe it is. Treatment for acute hematoma may include:  Emergency surgery to drain blood or remove a blood clot.  Medicines that help the body get rid of excess fluids (diuretics). These may help to reduce pressure in the brain.  Assisted breathing (ventilation). Treatment for chronic hematoma may include:  Observation and bed rest at the hospital.  Surgery. If you take blood thinners, you may need to stop taking them for a short time. You may also be given anti-seizure (anticonvulsant) medicine. Sometimes, no treatment is needed for chronic subdural hematoma. Follow these instructions at home: Activity  Avoid situations where you could injure your head again, such as in competitive sports, downhill snow sports, and horseback riding. Do not do these activities until your health care provider approves. ? Wear protective gear, such as a helmet, when participating in activities such as biking or contact sports.  Avoid too much visual stimulation while recovering. This means limiting how much you read and limiting your screen time on a smart phone, tablet, computer, or TV.  Rest as told by your health care provider. Rest helps the brain heal.  Try to avoid activities that cause physical or mental stress. Return to work or school as told by  your health care provider.  Do not lift anything that is heavier than 5 lb (2.3 kg), or the limit you are told, until your health care provider says that it is safe.  Do not drive, ride a bike, or use heavy machinery until your health care provider approves.  Always wear your seat belt  when you are in a motor vehicle. Alcohol use  Do not drink alcohol if your health care provider tells you not to drink.  If you drink alcohol, limit how much you use to: ? 0-1 drink a day for women. ? 0-2 drinks a day for men. General instructions  Monitor your symptoms, and ask people around you to do the same. Recovery from brain injuries varies. Talk with your health care provider about what to expect.  Take over-the-counter and prescription medicines only as told by your health care provider. Do not take blood thinners or NSAIDs unless your health care provider approves. These include aspirin, ibuprofen, naproxen, and warfarin.  Keep your home environment safe to reduce the risk of falling.  Keep all follow-up visits as told by your health care provider. This is important. Where to find more information  [employer] of Neurological Disorders and Stroke: MasterBoxes.it  American Academy of Neurology (AAN): http://keith.biz/  Brain Injury Association of [place]: www.biausa.org Get help right away if you:  Are taking blood thinners and you fall or you experience minor trauma to the head. If you take any blood thinners, even a very small injury can cause a subdural hematoma.  Have a bleeding disorder and you fall or you experience minor trauma to the head.  Develop any of the following symptoms after a head injury: ? Clear fluid draining from your nose or ears. ? Nausea or vomiting. ? Changes in speech or trouble understanding what people say. ? Seizures. ? Drowsiness or a decrease in alertness. ? Double vision. ? Numbness or inability to move (paralysis) in any part of your body. ? Difficulty walking or poor coordination. ? Difficulty thinking. ? Confusion or forgetfulness. ? Personality changes. ? Irrational or aggressive behavior. These symptoms may represent a serious problem that is an emergency. Do not wait to see if the symptoms will go away. Get medical help  right away. Call your local emergency services (911 in the [place] Do not drive yourself to the hospital. Summary  A subdural hematoma is a collection of blood between the brain and its outer covering (dura).  Treatment for this condition depends on what type of subdural hematoma you have and how severe it is.  Symptoms can vary from mild to severe to life-threatening.  Monitor your symptoms, and ask others around you to do the same. This information is not intended to replace advice given to you by your health care provider. Make sure you discuss any questions you have with your health care provider. Document Revised: 02/22/2018 Document Reviewed: 02/22/2018 Elsevier Patient Education  2021 [place] Prevention in the Home, Adult Falls can cause injuries and can happen to people of all ages. There are many things you can do to make your home safe and to help prevent falls. Ask for help when making these changes. What actions can I take to prevent falls? General Instructions  Use good lighting in all rooms. Replace any light bulbs that burn out.  Turn on the lights in dark areas. Use night-lights.  Keep items that you use often in easy-to-reach places. Lower the shelves around your home if  needed.  Set up your furniture so you have a clear path. Avoid moving your furniture around.  Do not have throw rugs or other things on the floor that can make you trip.  Avoid walking on wet floors.  If any of your floors are uneven, fix them.  Add color or contrast paint or tape to clearly mark and help you see: ? Grab bars or handrails. ? First and last steps of staircases. ? Where the edge of each step is.  If you use a stepladder: ? Make sure that it is fully opened. Do not climb a closed stepladder. ? Make sure the sides of the stepladder are locked in place. ? Ask someone to hold the stepladder while you use it.  Know where your pets are when moving through your  home. What can I do in the bathroom?  Keep the floor dry. Clean up any water on the floor right away.  Remove soap buildup in the tub or shower.  Use nonskid mats or decals on the floor of the tub or shower.  Attach bath mats securely with double-sided, nonslip rug tape.  If you need to sit down in the shower, use a plastic, nonslip stool.  Install grab bars by the toilet and in the tub and shower. Do not use towel bars as grab bars.      What can I do in the bedroom?  Make sure that you have a light by your bed that is easy to reach.  Do not use any sheets or blankets for your bed that hang to the floor.  Have a firm chair with side arms that you can use for support when you get dressed. What can I do in the kitchen?  Clean up any spills right away.  If you need to reach something above you, use a step stool with a grab bar.  Keep electrical cords out of the way.  Do not use floor polish or wax that makes floors slippery. What can I do with my stairs?  Do not leave any items on the stairs.  Make sure that you have a light switch at the top and the bottom of the stairs.  Make sure that there are handrails on both sides of the stairs. Fix handrails that are broken or loose.  Install nonslip stair treads on all your stairs.  Avoid having throw rugs at the top or bottom of the stairs.  Choose a carpet that does not hide the edge of the steps on the stairs.  Check carpeting to make sure that it is firmly attached to the stairs. Fix carpet that is loose or worn. What can I do on the outside of my home?  Use bright outdoor lighting.  Fix the edges of walkways and driveways and fix any cracks.  Remove anything that might make you trip as you walk through a door, such as a raised step or threshold.  Trim any bushes or trees on paths to your home.  Check to see if handrails are loose or broken and that both sides of all steps have handrails.  Install guardrails along  the edges of any raised decks and porches.  Clear paths of anything that can make you trip, such as tools or rocks.  Have leaves, snow, or ice cleared regularly.  Use sand or salt on paths during winter.  Clean up any spills in your garage right away. This includes grease or oil spills. What other actions can I  take?  Wear shoes that: ? Have a low heel. Do not wear high heels. ? Have rubber bottoms. ? Feel good on your feet and fit well. ? Are closed at the toe. Do not wear open-toe sandals.  Use tools that help you move around if needed. These include: ? Canes. ? Walkers. ? Scooters. ? Crutches.  Review your medicines with your doctor. Some medicines can make you feel dizzy. This can increase your chance of falling. Ask your doctor what else you can do to help prevent falls. Where to find more information  Centers for Disease Control and Prevention, STEADI: http://www.wolf.info/  National Institute on Aging: http://kim-miller.com/ Contact a doctor if:  You are afraid of falling at home.  You feel weak, drowsy, or dizzy at home.  You fall at home. Summary  There are many simple things that you can do to make your home safe and to help prevent falls.  Ways to make your home safe include removing things that can make you trip and installing grab bars in the bathroom.  Ask for help when making these changes in your home. This information is not intended to replace advice given to you by your health care provider. Make sure you discuss any questions you have with your health care provider. Document Revised: 10/26/2019 Document Reviewed: 10/26/2019 Elsevier Patient Education  [place].

## 2020-06-07 NOTE — TOC Initial Note (Signed)
Transition of Care [facility]) - Initial/Assessment Note    Patient Details  Name: [name] [name] MRN: [id number] Date of Birth: 04-Apr-1929  [facility] CM/SW Contact:    Shade Flood, LCSW Phone Number: 06/07/2020, 9:55 AM  Clinical Narrative:                  Pt admitted from [place]. PT/OT recommending SNF rehab at dc. Spoke with pt in her room and spoke with pt's sister by phone to discuss. Both pt and her sister are agreeable to SNF rehab referrals. Reviewed SNF provider options and will refer as requested. Will follow up with pt and family once bed offers available.  Insurance auth started.   Expected Discharge Plan: Skilled Nursing Facility Barriers to Discharge: SNF Pending bed offer,Insurance Authorization   Patient Goals and CMS Choice Patient states their goals for this hospitalization and ongoing recovery are:: To obtain PT and OT to assist with rehab.   Choice offered to / list presented to : NA  Expected Discharge Plan and Services Expected Discharge Plan: [place] In-house Referral: Clinical Social Work     Living arrangements for the past 2 months: [place]                                      Prior Living Arrangements/Services Living arrangements for the past 2 months: [place] Lives with:: Facility Resident Patient language and need for interpreter reviewed:: Yes Do you feel safe going back to the place where you live?: Yes      Need for Family Participation in Patient Care: No (Comment) Care giver support system in place?: Yes (comment) Current home services: DME Criminal Activity/Legal Involvement Pertinent to Current Situation/Hospitalization: No - Comment as needed  Activities of Daily Living Home Assistive Devices/Equipment: Bedside commode/3-in-1,Eyeglasses ADL Screening (condition at time of admission) Patient's cognitive ability adequate to safely complete daily activities?:  Yes Is the patient deaf or have difficulty hearing?: No Does the patient have difficulty seeing, even when wearing glasses/contacts?: Yes Does the patient have difficulty concentrating, remembering, or making decisions?: No Patient able to express need for assistance with ADLs?: Yes Does the patient have difficulty dressing or bathing?: No Independently performs ADLs?: Yes (appropriate for developmental age) Does the patient have difficulty walking or climbing stairs?: Yes Weakness of Legs: Both Weakness of Arms/Hands: Both  Permission Sought/Granted Permission sought to share information with : Chartered certified accountant granted to share information with : Yes, Verbal Permission Granted     Permission granted to share info w AGENCY: snfs        Emotional Assessment Appearance:: Appears younger than stated age Attitude/Demeanor/Rapport: Engaged Affect (typically observed): Pleasant Orientation: : Oriented to Self,Oriented to Place,Oriented to Situation Alcohol / Substance Use: Not Applicable Psych Involvement: No (comment)  Admission diagnosis:  Subdural hematoma ([place]) [X72.6O0B] SDH (subdural hematoma) ([place]) [S06.5X9A] Fall, initial encounter [W19.XXXA] Patient Active Problem List   Diagnosis Date Noted  . SDH (subdural hematoma) ([place]) 06/05/2020  . Recurrent UTI 03/11/2020  . Exudative age-related macular degeneration of right eye with inactive choroidal neovascularization ([place]) 12/20/2019  . Degenerative retinal drusen of both eyes 12/20/2019  . Exudative age-related macular degeneration of left eye with inactive choroidal neovascularization ([place]) 12/20/2019  . Traumatic subdural hematoma ([place]) 08/08/2018  . Pyelonephritis 09/09/2016  . Falls frequently 06/24/2016  . Tension headache 11/10/2013  .  Chest pain 11/03/2011  . ARF (acute renal failure) ([place]) 04/30/2011  . UTI (urinary tract infection) 04/30/2011  . CKD (chronic kidney disease), stage IV ([place])  04/30/2011  . Anemia 04/30/2011  . S/p nephrectomy 04/30/2011  . Macular degeneration 04/30/2011  . Cataract 04/30/2011  . Bladder cancer ([place]) 04/30/2011  . Weakness generalized 04/29/2011  . Seizure disorder ([place]) 04/29/2011  . Benign hypertension 04/29/2011  . CAD (coronary artery disease) 04/29/2011  . Hyperlipidemia 04/29/2011  . Anxiety 04/29/2011  . Dizziness - light-headed 04/29/2011  . Chronic abdominal pain 04/29/2011  . Mixed hyperlipidemia 05/24/2008  . Essential hypertension, benign 05/24/2008  . Postsurgical aortocoronary bypass status 05/24/2008   PCP:  Celene Squibb, MD Pharmacy:   [facility] [place], [address] [place] [address] Phone: [phone] Fax: [phone]     Social Determinants of Health (SDOH) Interventions    Readmission Risk Interventions Readmission Risk Prevention Plan 06/07/2020  Transportation Screening Complete  Social Work Consult for [place] Planning/Counseling Complete  Palliative Care Screening Not Applicable  Medication Review Press photographer) Complete  Some recent data might be hidden

## 2020-06-07 NOTE — [place] FL2 (Signed)
[place] MEDICAID FL2 LEVEL OF CARE SCREENING TOOL     IDENTIFICATION  Patient Name: [name] [name] Birthdate: October 21, 1928 Sex: female Admission Date (Current Location): 06/05/2020  [facility] and [place] Number:  Herbalist and Address:  [facility],  [place] [address] [place]      Provider Number: [id number]  Attending Physician Name and Address:  Bonnielee Haff, MD  Relative Name and Phone Number:       Current Level of Care: Hospital Recommended Level of Care: [place] Prior Approval Number:    Date Approved/Denied:   PASRR Number: [id number]  Discharge Plan: SNF    Current Diagnoses: Patient Active Problem List   Diagnosis Date Noted  . SDH (subdural hematoma) ([place]) 06/05/2020  . Recurrent UTI 03/11/2020  . Exudative age-related macular degeneration of right eye with inactive choroidal neovascularization ([place]) 12/20/2019  . Degenerative retinal drusen of both eyes 12/20/2019  . Exudative age-related macular degeneration of left eye with inactive choroidal neovascularization ([place]) 12/20/2019  . Traumatic subdural hematoma ([place]) 08/08/2018  . Pyelonephritis 09/09/2016  . Falls frequently 06/24/2016  . Tension headache 11/10/2013  . Chest pain 11/03/2011  . ARF (acute renal failure) ([place]) 04/30/2011  . UTI (urinary tract infection) 04/30/2011  . CKD (chronic kidney disease), stage IV ([place]) 04/30/2011  . Anemia 04/30/2011  . S/p nephrectomy 04/30/2011  . Macular degeneration 04/30/2011  . Cataract 04/30/2011  . Bladder cancer ([place]) 04/30/2011  . Weakness generalized 04/29/2011  . Seizure disorder ([place]) 04/29/2011  . Benign hypertension 04/29/2011  . CAD (coronary artery disease) 04/29/2011  . Hyperlipidemia 04/29/2011  . Anxiety 04/29/2011  . Dizziness - light-headed 04/29/2011  . Chronic abdominal pain 04/29/2011  . Mixed hyperlipidemia 05/24/2008  . Essential hypertension, benign 05/24/2008  . Postsurgical  aortocoronary bypass status 05/24/2008    Orientation RESPIRATION BLADDER Height & Weight     Self,Situation,Place  Normal Indwelling catheter (foley last changed 05/30/20) Weight: 158 lb 8.2 oz (71.9 kg) Height:  5' (152.4 cm)  BEHAVIORAL SYMPTOMS/MOOD NEUROLOGICAL BOWEL NUTRITION STATUS      Continent Diet (see dc summary)  AMBULATORY STATUS COMMUNICATION OF NEEDS Skin   Extensive Assist Verbally Normal                       Personal Care Assistance Level of Assistance  Bathing,Feeding,Dressing Bathing Assistance: Limited assistance Feeding assistance: Independent Dressing Assistance: Limited assistance     Functional Limitations Info  Sight,Hearing,Speech Sight Info: Adequate Hearing Info: Adequate Speech Info: Adequate    SPECIAL CARE FACTORS FREQUENCY  OT (By licensed OT),PT (By licensed PT)     PT Frequency: 5x week OT Frequency: 3x week            Contractures Contractures Info: Not present    Additional Factors Info  Code Status,Allergies Code Status Info: DNR Allergies Info: Adhesive tape, Cephalosporins, Keflex, Latex, Macrobid, Penicillins, Sulfa Antibiotics           Current Medications (06/07/2020):  This is the current hospital active medication list Current Facility-Administered Medications  Medication Dose Route Frequency Provider Last Rate Last Admin  . acetaminophen (TYLENOL) tablet 650 mg  650 mg Oral Q6H PRN Elwyn Reach, MD   650 mg at 06/06/20 0802   Or  . acetaminophen (TYLENOL) suppository 650 mg  650 mg Rectal Q6H PRN Gala Romney L, MD      . amLODipine (NORVASC) tablet 2.5 mg  2.5 mg Oral Daily Bonnielee Haff,  MD   2.5 mg at 06/06/20 1054  . Chlorhexidine Gluconate Cloth 2 % PADS 6 each  6 each Topical Daily Elwyn Reach, MD   6 each at 06/06/20 0802  . clonazePAM (KLONOPIN) tablet 0.5 mg  0.5 mg Oral QHS Bonnielee Haff, MD   0.5 mg at 06/06/20 2100  . gabapentin (NEURONTIN) capsule 300 mg  300 mg Oral QHS Bonnielee Haff, MD   300 mg at 06/06/20 2100  . HYDROcodone-acetaminophen (NORCO/VICODIN) 5-325 MG per tablet 1 tablet  1 tablet Oral Q6H PRN Bonnielee Haff, MD   1 tablet at 06/06/20 1734  . levETIRAcetam (KEPPRA) tablet 500 mg  500 mg Oral BID Traci Sermon, PA-C   500 mg at 06/06/20 2100  . ondansetron (ZOFRAN) tablet 4 mg  4 mg Oral Q6H PRN Elwyn Reach, MD       Or  . ondansetron (ZOFRAN) injection 4 mg  4 mg Intravenous Q6H PRN Gala Romney L, MD      . rosuvastatin (CRESTOR) tablet 10 mg  10 mg Oral Daily Bonnielee Haff, MD      . Vibegron TABS 75 mg  75 mg Oral Daily Bonnielee Haff, MD      . zolpidem [facility]) tablet 5 mg  5 mg Oral QHS PRN Bonnielee Haff, MD   5 mg at 06/06/20 2100     Discharge Medications: Please see discharge summary for a list of discharge medications.  Relevant Imaging Results:  Relevant Lab Results:   Additional Information SSN: [id number] LCSW

## 2020-06-08 DIAGNOSIS — S065X0A Traumatic subdural hemorrhage without loss of consciousness, initial encounter: Secondary | ICD-10-CM | POA: Diagnosis not present

## 2020-06-08 NOTE — Progress Notes (Signed)
This nurse called [facility] x2 to give report, left on the line both times on hold without an answer from receiving nurse.

## 2020-06-08 NOTE — Care Management Important Message (Signed)
Important Message  Patient Details IM Letter placed in Patient's door caddy. Name: [name] [name] MRN: [id number] Date of Birth: [birth year]   Medicare Important Message Given:  Yes     Kerin Salen 06/08/2020, 10:09 AM

## 2020-06-08 NOTE — Discharge Summary (Signed)
[facility]  Physician Discharge Summary   Patient ID: [name] [name] MRN: [id number] DOB/AGE: Oct 06, 1928 87 y.o.  Admit date: 06/05/2020 Discharge date: 06/08/2020  PCP: [name] Squibb, MD  DISCHARGE DIAGNOSES:  Subdural hematoma History of recurrent falls Seizure disorder Essential hypertension Chronic kidney disease stage IV History of bladder cancer with chronic indwelling Foley History of coronary artery disease and hyperlipidemia Normocytic anemia Recent positive COVID-19 test   RECOMMENDATIONS FOR OUTPATIENT FOLLOW UP: 1. Patient needs outpatient follow-up with neurosurgery, [name] [name], in 4 weeks    Home Health: Going to SNF Equipment/Devices: None  CODE STATUS: DNR  DISCHARGE CONDITION: fair  Diet recommendation: Low-sodium  INITIAL HISTORY: 87 y.o.femalewith medical history significant ofchronic kidney disease stage III, coronary artery disease, bladder cancer with chronic indwelling Foley catheter, seizure disorder, who has had recurrent falls at her assisted living facility.  The last time she fell she apparently hit her head on the floor.  She was sent over for further evaluation.  She was found to have a subdural hematoma.  Neurosurgery was consulted.  She was hospitalized.  Consultants: Neurosurgery   HOSPITAL COURSE:   Traumatic subdural hematoma Left-sided fluid collection was noted on CT head.  No mass-effect noted.  Patient seen by neurosurgery.  CT head was repeated which showed stability.  Reviewed by neurosurgery.  They recommend continuing to hold the aspirin.  Follow-up in their office in 4 weeks.  Patient on higher dose of Keppra for 7 days.  History of recurrent falls Patient lives in assisted living facility.  Seen by PT and OT who recommended SNF for short-term rehab.    History of seizure disorder Patient was on Keppra prior to admission.  Currently on a higher dose per neurosurgery.  Change back to her usual dose after 7  days.  Benign essential hypertension Blood pressure is reasonably well controlled at this time.  Continue amlodipine.  Chronic kidney disease stage IV Baseline creatinine as between 2.5 and 3.2.  Renal function remains at baseline.    History of bladder cancer with indwelling Foley catheter Patient with chronic indwelling Foley catheter with a recurrent history of UTIs.    Not an active issue during this hospitalization.  History of coronary artery disease and hyperlipidemia Stable.  Continue home medications.  Normocytic anemia Likely anemia of chronic disease.  Mild drop in hemoglobin is likely dilutional.  Hemoglobin noted to be stable.  Recently positive COVID-19 test Patient tested positive for COVID-19 on 04/25/20.  Asymptomatic currently.  Obesity Estimated body mass index is 30.96 kg/m as calculated from the following:   Height as of this encounter: 5' (1.524 m).   Weight as of this encounter: 71.9 kg.  Patient is stable.  Okay for discharge to SNF today.   PERTINENT LABS:  The results of significant diagnostics from this hospitalization (including imaging, microbiology, ancillary and laboratory) are listed below for reference.    Microbiology: Recent Results (from the past 240 hour(s))  MRSA PCR Screening     Status: None   Collection Time: 06/05/20  9:46 PM   Specimen: Nasopharyngeal  Result Value Ref Range Status   MRSA by PCR NEGATIVE NEGATIVE Final    Comment:        The GeneXpert MRSA Assay (FDA approved for NASAL specimens only), is one component of a comprehensive MRSA colonization surveillance program. It is not intended to diagnose MRSA infection nor to guide or monitor treatment for MRSA infections. Performed at [facility], [address]  [name] [name], [place] [address]      Labs:  COVID-19 Labs   Lab Results  Component Value Date   SARSCOV2NAA POSITIVE (A) 04/25/2020   [place] NEGATIVE 03/11/2020    [place] NEGATIVE 08/08/2018      Basic Metabolic Panel: Recent Labs  Lab 06/05/20 1445 06/06/20 0241 06/07/20 0515  NA 146* 140 140  K 4.4 4.0 4.3  CL 111 109 107  CO2 24 23 24   GLUCOSE 98 136* 100*  BUN 35* 35* 36*  CREATININE 2.60* 2.55* 2.54*  CALCIUM 9.2 8.6* 9.0   Liver Function Tests: Recent Labs  Lab 06/05/20 1445 06/06/20 0241  AST 21 19  ALT 12 11  ALKPHOS 74 69  BILITOT 0.7 0.6  PROT 6.9 6.2*  ALBUMIN 3.4* 3.0*   CBC: Recent Labs  Lab 06/05/20 1445 06/06/20 0241 06/07/20 0515  WBC 7.2 6.0 6.4  NEUTROABS 3.7  --   --   HGB 11.4* 10.4* 11.1*  HCT 36.3 33.3* 35.7*  MCV 91.2 90.7 92.0  PLT 192 182 163     IMAGING STUDIES DG Chest 2 View  Result Date: 06/05/2020 CLINICAL DATA:  Fall. EXAM: CHEST - 2 VIEW COMPARISON:  05/06/2020 FINDINGS: The heart size and mediastinal contours are within normal limits. There is atelectasis at the left lung base and potentially a small left pleural effusion. There is no evidence of pulmonary edema, consolidation or pneumothorax. The visualized skeletal structures are unremarkable. IMPRESSION: Left basilar atelectasis and possible small left pleural effusion. Electronically Signed   By: [name] [name] M.D.   On: 06/05/2020 16:02   CT HEAD WO CONTRAST  Result Date: 06/06/2020 CLINICAL DATA:  Recurrent falls, history chronic kidney disease stage III, coronary artery disease, bladder cancer EXAM: CT HEAD WITHOUT CONTRAST TECHNIQUE: Contiguous axial images were obtained from the base of the skull through the vertex without intravenous contrast. Sagittal and coronal MPR images reconstructed from axial data set. COMPARISON:  06/05/2020 FINDINGS: Brain: Generalized atrophy. Normal ventricular morphology. No midline shift or mass effect. Small vessel chronic ischemic changes of deep cerebral white matter. Again identified thin LEFT hemispheric subdural collection up to 5 mm thick consistent with acute on chronic subdural hematoma.  Additional high attenuation hemorrhage along the falx consistent with thin subdural hematoma. No abnormal RIGHT-sided fluid collection identified. No intraparenchymal hemorrhage or acute infarction. 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 Vascular: Atherosclerotic calcification of internal carotid and vertebral arteries at skull base Skull: Prior RIGHT parietal craniotomy. No additional osseous abnormalities. Sinuses/Orbits: Clear Other: N/A IMPRESSION: Atrophy with small vessel chronic ischemic changes of deep cerebral white matter. 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 Acute on chronic LEFT hemispheric subdural hematoma up to 5 mm thick, unchanged. Additional thin subdural hematoma along the falx, better visualized. No new intraparenchymal abnormalities. Findings called to Dr. Maryland Pink on 06/06/2020 at 1051 hours. Electronically Signed   By: Lavonia Dana M.D.   On: 06/06/2020 10:52   CT Head Wo Contrast  Result Date: 06/05/2020 CLINICAL DATA:  Unwitnessed fall x4 over 48 hours EXAM: CT HEAD WITHOUT CONTRAST CT CERVICAL SPINE WITHOUT CONTRAST TECHNIQUE: Multidetector CT imaging of the head and cervical spine was performed following the standard protocol without intravenous contrast. Multiplanar CT image reconstructions of the cervical spine were also generated. COMPARISON:  CT head and C-spine October 10 2019 FINDINGS: CT HEAD FINDINGS Brain: Mixed density left whole hemispheric fluid collection measuring 5 mm in maximum dimension on  coronal image 46. No evidence of acute large vascular territory infarction, hydrocephalus, or mass lesion/mass effect. Mild age related atrophy and chronic microvascular ischemic change. Right basal gangliar lacunar type infarct. Vascular: No hyperdense vessel. Atherosclerotic calcification of the internal carotid and vertebral arteries. Skull: Prior right parietal craniotomy.  No acute abnormality. Sinuses/Orbits:  No acute finding. Other: None CT CERVICAL SPINE FINDINGS Alignment: Mild positional straightening of the normal cervical lordosis. No acute listhesis. Skull base and vertebrae: No acute fracture. No primary bone lesion or focal pathologic process. Soft tissues and spinal canal: No prevertebral fluid or swelling. No visible canal hematoma. Disc levels: Similar mild multilevel degenerative changes spine. Partial bony fusion at C2-C3. Upper chest: Left upper lobe calcified granuloma. Other: Bilateral carotid calcifications. IMPRESSION: 1. Mixed density left whole hemispheric fluid collection measuring 5 mm in maximum dimension, most consistent with an acute on chronic subdural hematoma. No significant mass effect. 2. No evidence of acute fracture or traumatic listhesis of the cervical spine. 3. Similar mild multilevel degenerative changes of the cervical spine. These results were called by telephone at the time of interpretation on 06/05/2020 at 4:39 pm to provider [facility] , who verbally acknowledged these results. Electronically Signed   By: Dahlia Bailiff MD   On: 06/05/2020 16:43   CT Cervical Spine Wo Contrast  Result Date: 06/05/2020 CLINICAL DATA:  Unwitnessed fall x4 over 48 hours EXAM: CT HEAD WITHOUT CONTRAST CT CERVICAL SPINE WITHOUT CONTRAST TECHNIQUE: Multidetector CT imaging of the head and cervical spine was performed following the standard protocol without intravenous contrast. Multiplanar CT image reconstructions of the cervical spine were also generated. COMPARISON:  CT head and C-spine October 10 2019 FINDINGS: CT HEAD FINDINGS Brain: Mixed density left whole hemispheric fluid collection measuring 5 mm in maximum dimension on coronal image 46. No evidence of acute large vascular territory infarction, hydrocephalus, or mass lesion/mass effect. Mild age related atrophy and chronic microvascular ischemic change. Right basal gangliar lacunar type infarct. Vascular: No hyperdense vessel. Atherosclerotic  calcification of the internal carotid and vertebral arteries. Skull: Prior right parietal craniotomy.  No acute abnormality. Sinuses/Orbits: No acute finding. Other: None CT CERVICAL SPINE FINDINGS Alignment: Mild positional straightening of the normal cervical lordosis. No acute listhesis. Skull base and vertebrae: No acute fracture. No primary bone lesion or focal pathologic process. Soft tissues and spinal canal: No prevertebral fluid or swelling. No visible canal hematoma. Disc levels: Similar mild multilevel degenerative changes spine. Partial bony fusion at C2-C3. Upper chest: Left upper lobe calcified granuloma. Other: Bilateral carotid calcifications. IMPRESSION: 1. Mixed density left whole hemispheric fluid collection measuring 5 mm in maximum dimension, most consistent with an acute on chronic subdural hematoma. No significant mass effect. 2. No evidence of acute fracture or traumatic listhesis of the cervical spine. 3. Similar mild multilevel degenerative changes of the cervical spine. These results were called by telephone at the time of interpretation on 06/05/2020 at 4:39 pm to provider [facility] , who verbally acknowledged these results. Electronically Signed   By: Dahlia Bailiff MD   On: 06/05/2020 16:43   VAS [place] LOWER EXTREMITY VENOUS (DVT) (ONLY [facility] & [facility] 7a-7p)  Result Date: 06/05/2020  Lower Venous DVT Study Indications: Swelling.  Risk Factors: None identified. Limitations: Poor ultrasound/tissue interface and patient positioning. Comparison Study: No prior studies. Performing Technologist: Oliver Hum RVT  Examination Guidelines: A complete evaluation includes B-mode imaging, spectral Doppler, color Doppler, and power Doppler as needed of all accessible portions of each vessel. Bilateral  testing is considered an integral part of a complete examination. Limited examinations for reoccurring indications may be performed as noted. The reflux portion of the exam is performed with the patient in  reverse Trendelenburg.  +---------+---------------+---------+-----------+----------+--------------+ RIGHT    CompressibilityPhasicitySpontaneityPropertiesThrombus Aging +---------+---------------+---------+-----------+----------+--------------+ CFV      Full           Yes      Yes                                 +---------+---------------+---------+-----------+----------+--------------+ SFJ      Full                                                        +---------+---------------+---------+-----------+----------+--------------+ FV Prox  Full                                                        +---------+---------------+---------+-----------+----------+--------------+ FV Mid   Full                                                        +---------+---------------+---------+-----------+----------+--------------+ FV DistalFull                                                        +---------+---------------+---------+-----------+----------+--------------+ PFV      Full                                                        +---------+---------------+---------+-----------+----------+--------------+ POP      Full           Yes      Yes                                 +---------+---------------+---------+-----------+----------+--------------+ PTV      Full                                                        +---------+---------------+---------+-----------+----------+--------------+ PERO     Full                                                        +---------+---------------+---------+-----------+----------+--------------+   +----+---------------+---------+-----------+----------+--------------+ LEFTCompressibilityPhasicitySpontaneityPropertiesThrombus Aging +----+---------------+---------+-----------+----------+--------------+ CFV Full  Yes      Yes                                  +----+---------------+---------+-----------+----------+--------------+     Summary: RIGHT: - There is no evidence of deep vein thrombosis in the lower extremity.  - No cystic structure found in the popliteal fossa.  LEFT: - No evidence of common femoral vein obstruction.  *See table(s) above for measurements and observations. Electronically signed by Curt Jews MD on 06/05/2020 at 7:15:09 PM.    Final     DISCHARGE EXAMINATION: Vitals:   06/07/20 0445 06/07/20 1323 06/07/20 2059 06/08/20 0528  BP: (!) 139/58 (!) 122/56 138/72 126/60  Pulse: 65 71 74 65  Resp: 20 14 18 20   Temp: (!) 97.4 F (36.3 C) 97.6 F (36.4 C)    TempSrc: Oral Oral    SpO2: 93% 91% 90% 93%  Weight:      Height:       General appearance: Awake alert.  In no distress Resp: Clear to auscultation bilaterally.  Normal effort Cardio: S1-S2 is normal regular.  No S3-S4.  No rubs murmurs or bruit GI: Abdomen is soft.  Nontender nondistended.  Bowel sounds are present normal.  No masses organomegaly    DISPOSITION: SNF  Discharge Instructions    Call MD for:  difficulty breathing, headache or visual disturbances   Complete by: As directed    Call MD for:  extreme fatigue   Complete by: As directed    Call MD for:  persistant dizziness or light-headedness   Complete by: As directed    Call MD for:  persistant nausea and vomiting   Complete by: As directed    Call MD for:  severe uncontrolled pain   Complete by: As directed    Call MD for:  temperature >100.4   Complete by: As directed    Diet - low sodium heart healthy   Complete by: As directed    Discharge instructions   Complete by: As directed    Please review instructions on the discharge summary.  You were cared for by a hospitalist during your hospital stay. If you have any questions about your discharge medications or the care you received while you were in the hospital after you are discharged, you can call the unit and asked to speak with the  hospitalist on call if the hospitalist that took care of you is not available. Once you are discharged, your primary care physician will handle any further medical issues. Please note that NO REFILLS for any discharge medications will be authorized once you are discharged, as it is imperative that you return to your primary care physician (or establish a relationship with a primary care physician if you do not have one) for your aftercare needs so that they can reassess your need for medications and monitor your lab values. If you do not have a primary care physician, you can call [phone] for a physician referral.   Increase activity slowly   Complete by: As directed        Allergies as of 06/08/2020      Reactions   Adhesive [tape] Other (See Comments)   Tears patients skin off.    Cephalosporins Other (See Comments)   unknown   Keflet [cephalexin] Nausea And Vomiting   Latex Itching   Macrobid [nitrofurantoin] Other (See Comments)   unknown   Penicillins Hives, Itching  Sulfa Antibiotics Hives, Itching      Medication List    STOP taking these medications   aspirin EC 81 MG tablet   levofloxacin 500 MG tablet Commonly known as: LEVAQUIN   sodium bicarbonate 650 MG tablet     TAKE these medications   acetaminophen 325 MG tablet Commonly known as: TYLENOL Take 325 mg by mouth every 6 (six) hours. With hydrocodone   amLODipine 2.5 MG tablet Commonly known as: NORVASC Take 2.5 mg by mouth daily. What changed: Another medication with the same name was removed. Continue taking this medication, and follow the directions you see here.   AZO Bladder Control/Go-Less Caps Take 1 capsule by mouth daily.   Belsomra 10 MG Tabs Generic drug: Suvorexant Take 10 mg by mouth at bedtime.   Benefiber Powd Take 1 Scoop by mouth daily at 6 (six) AM.   clonazePAM 0.5 MG tablet Commonly known as: KLONOPIN Take 1 tablet (0.5 mg total) by mouth at bedtime. What changed: when to take  this   diphenhydrAMINE 25 mg capsule Commonly known as: BENADRYL Take 25 mg by mouth every 8 (eight) hours as needed for itching.   gabapentin 300 MG capsule Commonly known as: NEURONTIN Take 1 capsule (300 mg total) by mouth at bedtime.   Gemtesa 75 MG Tabs Generic drug: Vibegron Take 75 mg by mouth daily.   HYDROcodone-acetaminophen 5-325 MG tablet Commonly known as: NORCO/VICODIN Take 1 tablet by mouth every 8 (eight) hours.   levETIRAcetam 250 MG tablet Commonly known as: KEPPRA Take 500mg  twice daily till 06/13/20 and then 250mg  twice daily as before What changed:   how much to take  how to take this  when to take this  additional instructions   linaclotide 290 MCG Caps capsule Commonly known as: LINZESS Take 290 mcg by mouth daily.   NONFORMULARY OR COMPOUNDED ITEM Apply 1 application topically in the morning and at bedtime. Anti -itch lotion 0.5-0.5% ( Mix with triamcinolone)   polyethylene glycol 17 g packet Commonly known as: MIRALAX / GLYCOLAX Take 17 g by mouth See admin instructions. Every other day as needed for constipation.   PRESERVISION AREDS PO Take 1 capsule by mouth daily.   rosuvastatin 10 MG tablet Commonly known as: CRESTOR Take 10 mg by mouth daily.   tizanidine 2 MG capsule Commonly known as: ZANAFLEX Take 2 mg by mouth 2 (two) times daily as needed for muscle spasms.   triamcinolone 0.1 % Commonly known as: KENALOG Apply 1 application topically 2 (two) times daily. Mix with Sarna lotion         Contact information for follow-up providers    Consuella Lose, MD. Schedule an appointment as soon as possible for a visit in 4 week(s).   Specialty: Neurosurgery Why: for subdural hematoma  Contact information: [address] [place] [address] [phone]        [name] Squibb, MD. Schedule an appointment as soon as possible for a visit in 2 week(s).   Specialty: Internal Medicine Contact  information: [place] [address] [phone]            Contact information for after-discharge care    Destination    [facility] .   Service: Skilled Chiropodist information: [place]                  TOTAL DISCHARGE TIME: 35 minutes  Bonnielee Haff  [facility] operational officer on [email or web address]  06/08/2020, 9:41 AM

## 2020-06-08 NOTE — TOC Transition Note (Signed)
Transition of Care [facility]) - CM/SW Discharge Note   Patient Details  Name: [name] [name] MRN: [id number] Date of Birth: 05-18-1928  [facility] CM/SW Contact:  Shade Flood, LCSW Phone Number: 06/08/2020, 10:29 AM   Clinical Narrative:     Pt stable for dc today per MD. Plan remains for transfer to [facility] for SNF rehab. Updated pt's sister, Hoyle Sauer. Spoke with Loie at [facility]. They are prepared to accept pt today. RN to call report to [phone] ask for [facility]. DC envelope at the desk. [facility] will transport.  There are no other TOC needs for dc.   Final next level of care: Skilled Nursing Facility Barriers to Discharge: Barriers Resolved   Patient Goals and CMS Choice Patient states their goals for this hospitalization and ongoing recovery are:: To obtain PT and OT to assist with rehab. CMS Medicare.gov Compare Post Acute Care list provided to:: Patient Choice offered to / list presented to : Patient  Discharge Placement   Existing PASRR number confirmed : 06/07/20          Patient chooses bed at: Other - please specify in the comment section below: ([facility]) Patient to be transferred to facility by: [facility] Name of family member notified: Carolynt Patient and family notified of of transfer: 06/08/20  Discharge Plan and Services In-house Referral: Clinical Social Work                                   Social Determinants of Health (SDOH) Interventions     Readmission Risk Interventions Readmission Risk Prevention Plan 06/07/2020  Transportation Screening Complete  Social Work Consult for [place] Planning/Counseling Complete  Palliative Care Screening Not Applicable  Medication Review Press photographer) Complete  Some recent data might be hidden

## 2020-09-18 ENCOUNTER — Non-Acute Institutional Stay: Payer: Medicare Other | Admitting: Hospice

## 2020-09-18 ENCOUNTER — Other Ambulatory Visit: Payer: Self-pay

## 2020-09-18 DIAGNOSIS — G8929 Other chronic pain: Secondary | ICD-10-CM

## 2020-09-18 DIAGNOSIS — Z515 Encounter for palliative care: Secondary | ICD-10-CM

## 2020-09-18 DIAGNOSIS — R269 Unspecified abnormalities of gait and mobility: Secondary | ICD-10-CM

## 2020-09-18 NOTE — Progress Notes (Signed)
[facility] Consult Note Telephone: [phone]  Fax: [phone]  PATIENT NAME: [name] [name] DOB: [birth year] MRN: [id number]  PRIMARY CARE PROVIDER:   Everlena Cooper, NP   REFERRING PROVIDER: Everlena Cooper, NP   RESPONSIBLE PARTY:  [place] Information     Name Relation Home Work Mobile   Reed,Carolyn Sister [phone]     Warrick Parisian   [phone]   Pettigrew,Cheryl Other   [phone]       Visit is to build trust and highlight Palliative Medicine as specialized medical care for people living with serious illness, aimed at facilitating better quality of life through symptoms relief, assisting with advance care planning and complex medical decision making.   RECOMMENDATIONS/PLAN:   Advance Care Planning/Code Status: Code status reviewed today. Patient affirmed she is a Do Not Resuscitate.   Goals of Care: Goals of care include to maximize quality of life and symptom management. MOST form selections in facility chart include Comfort measures, no feeding tube, IV fluids if indicated, determine use of antibiotic when infecion occurs.  Visit consisted of counseling and education dealing with the complex and emotionally intense issues of symptom management and palliative care in the setting of serious and potentially life-threatening illness. Patient reported his brother died two months ago. Ample emotional support provided. Palliative care team will continue to support patient, patient's family, and medical team.  I spent  16 minutes providing this consultation. More than 50% of the time in this consultation was spent on coordinating communication.  -------------------------------------------------------------------------------------------------------------------------------------------------- Symptom management/Plan:  Unsteady Gait: Completed PT/OT. Safety and Fall precautions discussed. Patient is transfers only,  no longer ambulatory. PT/OT consult as needed.  Restorative exercises. Low back pain:Thermal therapy discussed; position changes for comfort. Hydrocodone, Tizanidine as ordered. Routine CBC BMP Follow up: Palliative care will continue to follow for complex medical decision making, advance care planning, and clarification of goals. Return 6 weeks or prn. Encouraged to call provider sooner with any concerns.  CHIEF COMPLAINT: Palliative follow up visit/Unsteady gait  HISTORY OF PRESENT ILLNESS:  [name] [name] a 87 y.o. female with multiple medical problems including gait instability worsening in the last month, patient no longer able to ambulate, transfers only.  Gait instability is worsened by low back pain, impairing her independence in activities of daily living.  Contact-guard assistance is helpful with transfers.  Recently completed PT OT did not make any difference, per patient.  Gait instability recently resulted in a fall for which she was hospitalized in March this year and.  At the hospital it was determined she had traumatic subdural hematoma from the fall; she was treated conservatively with no surgical intervention. Head CT showed stability. History of CKD4, CAD, bladder cancer-Foley cath in place, seizure disorder, recurrent falls, depression. History obtained from review of EMR, discussion with primary team, family and/or patient. Records reviewed and summarized above. All 10 point systems reviewed and are negative except as documented in history of present illness above  Review and summarization of Epic records shows history from other than patient.   Palliative Care was asked to follow this patient o help address complex decision making in the context of advance care planning and goals of care clarification.   PPS: 40%  ROS General: NAD EYES: denies vision changes ENMT: denies dysphagia Cardiovascular: denies chest pain/discomfort Pulmonary: denies cough, denies SOB Abdomen:  endorses good appetite, denies constipation/diarrhea GU: denies dysuria, urinary frequency MSK: Endorses gait imbalance and weakness, Skin: denies rashes or  wounds Neurological: denies pain, denies insomnia Psych: Endorses positive mood Heme/lymph/immuno: denies bruises, abnormal bleeding  PHYSICAL EXAM  Height/Weight: 5 feet 2 inches/154.8Ibs General: In no acute distress, appropriately dressed Cardiovascular: regular rate and rhythm; no edema in BLE Pulmonary: no cough, no increased work of breathing, normal respiratory effort Abdomen: soft, non tender, no guarding, positive bowel sounds in all quadrants GU:  no suprapubic tenderness; Foley cath in place Eyes: Normal lids, no discharge ENMT: Moist mucous membranes Musculoskeletal: weakness, gait imbalance Skin: no rash to visible skin, warm without cyanosis,  Psych: non-anxious affect Neurological: Weakness but otherwise non focal Heme/lymph/immuno: no bruises, no bleeding  PERTINENT MEDICATIONS:  Outpatient Encounter Medications as of 09/18/2020  Medication Sig   acetaminophen (TYLENOL) 325 MG tablet Take 325 mg by mouth every 6 (six) hours. With hydrocodone   amLODipine (NORVASC) 2.5 MG tablet Take 2.5 mg by mouth daily.   clonazePAM (KLONOPIN) 0.5 MG tablet Take 1 tablet (0.5 mg total) by mouth at bedtime.   diphenhydrAMINE (BENADRYL) 25 mg capsule Take 25 mg by mouth every 8 (eight) hours as needed for itching.   gabapentin (NEURONTIN) 300 MG capsule Take 1 capsule (300 mg total) by mouth at bedtime.   HYDROcodone-acetaminophen (NORCO/VICODIN) 5-325 MG tablet Take 1 tablet by mouth every 8 (eight) hours.   levETIRAcetam (KEPPRA) 250 MG tablet Take 500mg  twice daily till 06/13/20 and then 250mg  twice daily as before   linaclotide (LINZESS) 290 MCG CAPS capsule Take 290 mcg by mouth daily.   Multiple Vitamins-Minerals (PRESERVISION AREDS PO) Take 1 capsule by mouth daily.   NONFORMULARY OR COMPOUNDED ITEM Apply 1 application  topically in the morning and at bedtime. Anti -itch lotion 0.5-0.5% ( Mix with triamcinolone)   polyethylene glycol (MIRALAX / GLYCOLAX) packet Take 17 g by mouth See admin instructions. Every other day as needed for constipation.   Pumpkin Seed-Soy Germ (AZO BLADDER CONTROL/GO-LESS) CAPS Take 1 capsule by mouth daily.    rosuvastatin (CRESTOR) 10 MG tablet Take 10 mg by mouth daily.    Suvorexant (BELSOMRA) 10 MG TABS Take 10 mg by mouth at bedtime.   tizanidine (ZANAFLEX) 2 MG capsule Take 2 mg by mouth 2 (two) times daily as needed for muscle spasms.   triamcinolone cream (KENALOG) 0.1 % Apply 1 application topically 2 (two) times daily. Mix with Sarna lotion   Vibegron (GEMTESA) 75 MG TABS Take 75 mg by mouth daily.   Wheat Dextrin (BENEFIBER) POWD Take 1 Scoop by mouth daily at 6 (six) AM.   No facility-administered encounter medications on file as of 09/18/2020.    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 Range 06/07/2020 05:15  Sodium Latest Ref Range: 135 - 145 mmol/L 140  Potassium Latest Ref Range: 3.5 - 5.1 mmol/L 4.3  Chloride Latest Ref Range: 98 - 111 mmol/L 107  CO2 Latest Ref Range: 22 - 32 mmol/L 24  Glucose Latest Ref Range: 70 - 99 mg/dL 100 (H)  BUN Latest Ref Range: 8 - 23 mg/dL 36 (H)  Creatinine Latest Ref Range: 0.44 - 1.00 mg/dL 2.54 (H)  Calcium Latest Ref Range: 8.9 - 10.3 mg/dL 9.0   Anion gap Latest Ref Range: 5 - 15  9  GFR, Estimated Latest Ref Range: >60 mL/min 18 (L)    ALLERGIES:  Allergies  Allergen Reactions   Adhesive [Tape] Other (See Comments)    Tears patients skin off.    Cephalosporins Other (See Comments)    unknown   Keflet [Cephalexin] Nausea And Vomiting   Latex Itching   Macrobid [Nitrofurantoin] Other (See Comments)    unknown   Penicillins Hives and Itching   Sulfa Antibiotics Hives and Itching       Thank you for the opportunity to participate in the care of SOLANA COGGIN Please call our office at [phone] if we can be of additional assistance.  Note: Portions of this note were generated with Lobbyist. Dictation errors may occur despite best attempts at proofreading.  Teodoro Spray, NP

## 2021-01-09 ENCOUNTER — Other Ambulatory Visit: Payer: Self-pay

## 2021-01-09 ENCOUNTER — Non-Acute Institutional Stay: Payer: Medicare Other | Admitting: Hospice

## 2021-01-09 DIAGNOSIS — R269 Unspecified abnormalities of gait and mobility: Secondary | ICD-10-CM

## 2021-01-09 DIAGNOSIS — G8929 Other chronic pain: Secondary | ICD-10-CM

## 2021-01-09 DIAGNOSIS — Z515 Encounter for palliative care: Secondary | ICD-10-CM

## 2021-01-09 NOTE — Progress Notes (Signed)
[facility] Consult Note Telephone: [phone]  Fax: [phone]  PATIENT NAME: [name] [name] DOB: [birth year] MRN: [id number]  PRIMARY CARE PROVIDER:   Celene Squibb, MD Celene Squibb, MD [place],  [place] [address]  REFERRING PROVIDER: Celene Squibb, MD Celene Squibb, MD [place],  [place] [address]  RESPONSIBLE PARTY:  [place]     Name Relation Home Work Mobile   Reed,Carolyn Sister [phone]     Warrick Parisian   [phone]   Pettigrew,Cheryl Other   [phone]       Visit is to build trust and highlight Palliative Medicine as specialized medical care for people living with serious illness, aimed at facilitating better quality of life through symptoms relief, assisting with advance care planning and complex medical decision making. This is a follow up visit.  RECOMMENDATIONS/PLAN:   Advance Care Planning/Code Status:Patient is a Do Not Resuscitate  Goals of Care: Goals of care include to maximize quality of life and symptom management. MOST form selections in facility chart include Comfort measures, no feeding tube, IV fluids if indicated, determine use of antibiotic when infecion occurs. Palliative care team will continue to support patient, patient's family, and medical team.  Symptom management/Plan:  Low back pain: Managed with Hydrocodone, Tizanidine. Continue to participate in restorative activities in the facility Unsteady gait: Fall and safety precautions in place. No report of fall. Continue ongoing supportive care.  Follow up: Palliative care will continue to follow for complex medical decision making, advance care planning, and clarification of goals. Return 6 weeks or prn. Encouraged to call provider sooner with any concerns.  CHIEF COMPLAINT: Palliative follow up  HISTORY OF PRESENT ILLNESS:  [name] [name] a 87 y.o. female with multiple medical problems  including chronic low back pain currently managed with Hydrocodone and Tizanidine. History of gait imbalance, CKD4, CAD, bladder cancer-Foley cath in place, seizure disorder, recurrent falls, depression. History of traumatic subdural hematoma from the fall, which was treated conservatively with no surgical intervention.  Patient reported she got her flu shot yesterday and did not want to get out of bed and participate in activities but feeling a lot better and up in her wheelchair today.  History obtained from review of EMR, discussion with primary team, family and/or patient. Records reviewed and summarized above. All 10 point systems reviewed and are negative except as documented in history of present illness above  Review and summarization of Epic records shows history from other than patient.   Palliative Care was asked to follow this patient o help address complex decision making in the context of advance care planning and goals of care clarification.   PHYSICAL EXAM  General: In no acute distress, appropriately dressed Cardiovascular: regular rate and rhythm; no edema in BLE Pulmonary: no cough, no increased work of breathing, normal respiratory effort Abdomen: soft, non tender, no guarding, positive bowel sounds in all quadrants GU:  no suprapubic tenderness; Foley cath in place Eyes: Normal lids, no discharge ENMT: Moist mucous membranes Musculoskeletal: weakness, gait imbalance, gets around mostly in her wheelchair Skin: no rash to visible skin, warm without cyanosis,  Psych: non-anxious affect Neurological: Weakness but otherwise non focal Heme/lymph/immuno: no bruises, no bleeding  PERTINENT MEDICATIONS:  Outpatient Encounter Medications as of 01/09/2021  Medication Sig   acetaminophen (TYLENOL) 325 MG tablet Take 325 mg by mouth every 6 (six) hours. With hydrocodone   amLODipine (NORVASC)  2.5 MG tablet Take 2.5 mg by mouth daily.   clonazePAM (KLONOPIN) 0.5 MG tablet Take 1 tablet  (0.5 mg total) by mouth at bedtime.   diphenhydrAMINE (BENADRYL) 25 mg capsule Take 25 mg by mouth every 8 (eight) hours as needed for itching.   gabapentin (NEURONTIN) 300 MG capsule Take 1 capsule (300 mg total) by mouth at bedtime.   HYDROcodone-acetaminophen (NORCO/VICODIN) 5-325 MG tablet Take 1 tablet by mouth every 8 (eight) hours.   levETIRAcetam (KEPPRA) 250 MG tablet Take 500mg  twice daily till 06/13/20 and then 250mg  twice daily as before   linaclotide (LINZESS) 290 MCG CAPS capsule Take 290 mcg by mouth daily.   Multiple Vitamins-Minerals (PRESERVISION AREDS PO) Take 1 capsule by mouth daily.   NONFORMULARY OR COMPOUNDED ITEM Apply 1 application topically in the morning and at bedtime. Anti -itch lotion 0.5-0.5% ( Mix with triamcinolone)   polyethylene glycol (MIRALAX / GLYCOLAX) packet Take 17 g by mouth See admin instructions. Every other day as needed for constipation.   Pumpkin Seed-Soy Germ (AZO BLADDER CONTROL/GO-LESS) CAPS Take 1 capsule by mouth daily.    rosuvastatin (CRESTOR) 10 MG tablet Take 10 mg by mouth daily.    Suvorexant (BELSOMRA) 10 MG TABS Take 10 mg by mouth at bedtime.   tizanidine (ZANAFLEX) 2 MG capsule Take 2 mg by mouth 2 (two) times daily as needed for muscle spasms.   triamcinolone cream (KENALOG) 0.1 % Apply 1 application topically 2 (two) times daily. Mix with Sarna lotion   Vibegron (GEMTESA) 75 MG TABS Take 75 mg by mouth daily.   Wheat Dextrin (BENEFIBER) POWD Take 1 Scoop by mouth daily at 6 (six) AM.   No facility-administered encounter medications on file as of 01/09/2021.    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    Cephalosporins Other (See Comments)    unknown   Keflet [Cephalexin] Nausea And Vomiting   Latex Itching   Macrobid [Nitrofurantoin] Other (See Comments)    unknown   Penicillins Hives and Itching   Sulfa Antibiotics Hives and Itching      I spent 40 minutes providing this consultation; this includes time spent with patient/family, chart review and documentation. More than 50% of the time in this consultation was spent on counseling and coordinating communication   Thank you for the opportunity to participate in the care of LILO WALLINGTON Please call our office at [phone] if we can be of additional assistance.  Note: Portions of this note were generated with Lobbyist. Dictation errors may occur despite best attempts at proofreading.  Teodoro Spray, NP

## 2021-01-17 ENCOUNTER — Emergency Department (HOSPITAL_COMMUNITY): Payer: Medicare Other

## 2021-01-17 ENCOUNTER — Observation Stay (HOSPITAL_COMMUNITY): Payer: Medicare Other

## 2021-01-17 ENCOUNTER — Other Ambulatory Visit: Payer: Self-pay

## 2021-01-17 ENCOUNTER — Inpatient Hospital Stay (HOSPITAL_COMMUNITY)
Admission: EM | Admit: 2021-01-17 | Discharge: 2021-01-24 | DRG: 155 | Disposition: A | Discharge status: Skilled Nursing Facility | Payer: Medicare Other | Source: Skilled Nursing Facility | Attending: Internal Medicine | Admitting: Internal Medicine

## 2021-01-17 ENCOUNTER — Encounter (HOSPITAL_COMMUNITY): Payer: Self-pay

## 2021-01-17 DIAGNOSIS — Z79891 Long term (current) use of opiate analgesic: Secondary | ICD-10-CM

## 2021-01-17 DIAGNOSIS — Z951 Presence of aortocoronary bypass graft: Secondary | ICD-10-CM

## 2021-01-17 DIAGNOSIS — R52 Pain, unspecified: Secondary | ICD-10-CM | POA: Diagnosis not present

## 2021-01-17 DIAGNOSIS — Z993 Dependence on wheelchair: Secondary | ICD-10-CM

## 2021-01-17 DIAGNOSIS — N390 Urinary tract infection, site not specified: Secondary | ICD-10-CM | POA: Diagnosis present

## 2021-01-17 DIAGNOSIS — Z8249 Family history of ischemic heart disease and other diseases of the circulatory system: Secondary | ICD-10-CM

## 2021-01-17 DIAGNOSIS — E876 Hypokalemia: Secondary | ICD-10-CM | POA: Diagnosis present

## 2021-01-17 DIAGNOSIS — N184 Chronic kidney disease, stage 4 (severe): Secondary | ICD-10-CM | POA: Diagnosis present

## 2021-01-17 DIAGNOSIS — Z87440 Personal history of urinary (tract) infections: Secondary | ICD-10-CM

## 2021-01-17 DIAGNOSIS — I2510 Atherosclerotic heart disease of native coronary artery without angina pectoris: Secondary | ICD-10-CM | POA: Diagnosis present

## 2021-01-17 DIAGNOSIS — R252 Cramp and spasm: Secondary | ICD-10-CM | POA: Diagnosis not present

## 2021-01-17 DIAGNOSIS — T380X5A Adverse effect of glucocorticoids and synthetic analogues, initial encounter: Secondary | ICD-10-CM | POA: Diagnosis not present

## 2021-01-17 DIAGNOSIS — Z20822 Contact with and (suspected) exposure to covid-19: Secondary | ICD-10-CM | POA: Diagnosis present

## 2021-01-17 DIAGNOSIS — Z91040 Latex allergy status: Secondary | ICD-10-CM

## 2021-01-17 DIAGNOSIS — E860 Dehydration: Secondary | ICD-10-CM | POA: Diagnosis present

## 2021-01-17 DIAGNOSIS — C679 Malignant neoplasm of bladder, unspecified: Secondary | ICD-10-CM | POA: Diagnosis not present

## 2021-01-17 DIAGNOSIS — G40909 Epilepsy, unspecified, not intractable, without status epilepticus: Secondary | ICD-10-CM

## 2021-01-17 DIAGNOSIS — R519 Headache, unspecified: Secondary | ICD-10-CM | POA: Diagnosis not present

## 2021-01-17 DIAGNOSIS — G8929 Other chronic pain: Secondary | ICD-10-CM | POA: Diagnosis present

## 2021-01-17 DIAGNOSIS — E782 Mixed hyperlipidemia: Secondary | ICD-10-CM | POA: Diagnosis present

## 2021-01-17 DIAGNOSIS — G4700 Insomnia, unspecified: Secondary | ICD-10-CM | POA: Diagnosis present

## 2021-01-17 DIAGNOSIS — H3530 Unspecified macular degeneration: Secondary | ICD-10-CM | POA: Diagnosis present

## 2021-01-17 DIAGNOSIS — I10 Essential (primary) hypertension: Secondary | ICD-10-CM

## 2021-01-17 DIAGNOSIS — K1121 Acute sialoadenitis: Principal | ICD-10-CM | POA: Diagnosis present

## 2021-01-17 DIAGNOSIS — I129 Hypertensive chronic kidney disease with stage 1 through stage 4 chronic kidney disease, or unspecified chronic kidney disease: Secondary | ICD-10-CM | POA: Diagnosis present

## 2021-01-17 DIAGNOSIS — K1120 Sialoadenitis, unspecified: Secondary | ICD-10-CM | POA: Diagnosis not present

## 2021-01-17 DIAGNOSIS — Z9109 Other allergy status, other than to drugs and biological substances: Secondary | ICD-10-CM

## 2021-01-17 DIAGNOSIS — Z882 Allergy status to sulfonamides status: Secondary | ICD-10-CM

## 2021-01-17 DIAGNOSIS — D72829 Elevated white blood cell count, unspecified: Secondary | ICD-10-CM | POA: Diagnosis not present

## 2021-01-17 DIAGNOSIS — Z66 Do not resuscitate: Secondary | ICD-10-CM | POA: Diagnosis present

## 2021-01-17 DIAGNOSIS — Z881 Allergy status to other antibiotic agents status: Secondary | ICD-10-CM

## 2021-01-17 DIAGNOSIS — F32A Depression, unspecified: Secondary | ICD-10-CM | POA: Diagnosis present

## 2021-01-17 DIAGNOSIS — N179 Acute kidney failure, unspecified: Secondary | ICD-10-CM | POA: Diagnosis not present

## 2021-01-17 DIAGNOSIS — Z905 Acquired absence of kidney: Secondary | ICD-10-CM

## 2021-01-17 DIAGNOSIS — Z79899 Other long term (current) drug therapy: Secondary | ICD-10-CM

## 2021-01-17 DIAGNOSIS — Z880 Allergy status to penicillin: Secondary | ICD-10-CM

## 2021-01-17 LAB — CBC WITH DIFFERENTIAL/PLATELET
Abs Immature Granulocytes: 0.04 10*3/uL (ref 0.00–0.07)
Basophils Absolute: 0 10*3/uL (ref 0.0–0.1)
Basophils Relative: 0 %
Eosinophils Absolute: 0.2 10*3/uL (ref 0.0–0.5)
Eosinophils Relative: 3 %
HCT: 39.8 % (ref 36.0–46.0)
Hemoglobin: 12.8 g/dL (ref 12.0–15.0)
Immature Granulocytes: 0 %
Lymphocytes Relative: 24 %
Lymphs Abs: 2.3 10*3/uL (ref 0.7–4.0)
MCH: 29.3 pg (ref 26.0–34.0)
MCHC: 32.2 g/dL (ref 30.0–36.0)
MCV: 91.1 fL (ref 80.0–100.0)
Monocytes Absolute: 0.8 10*3/uL (ref 0.1–1.0)
Monocytes Relative: 8 %
Neutro Abs: 6.1 10*3/uL (ref 1.7–7.7)
Neutrophils Relative %: 65 %
Platelets: 202 10*3/uL (ref 150–400)
RBC: 4.37 MIL/uL (ref 3.87–5.11)
RDW: 15.1 % (ref 11.5–15.5)
WBC: 9.5 10*3/uL (ref 4.0–10.5)
nRBC: 0 % (ref 0.0–0.2)

## 2021-01-17 LAB — URINALYSIS, ROUTINE W REFLEX MICROSCOPIC
Bilirubin Urine: NEGATIVE
Glucose, UA: NEGATIVE mg/dL
Ketones, ur: 5 mg/dL — AB
Nitrite: POSITIVE — AB
Protein, ur: 30 mg/dL — AB
Specific Gravity, Urine: 1.009 (ref 1.005–1.030)
WBC, UA: >50 WBC/hpf — ABNORMAL HIGH (ref 0–5)
pH: 5 (ref 5.0–8.0)

## 2021-01-17 LAB — COMPREHENSIVE METABOLIC PANEL
ALT: 11 U/L (ref 0–44)
AST: 25 U/L (ref 15–41)
Albumin: 3.4 g/dL — ABNORMAL LOW (ref 3.5–5.0)
Alkaline Phosphatase: 72 U/L (ref 38–126)
Anion gap: 12 (ref 5–15)
BUN: 36 mg/dL — ABNORMAL HIGH (ref 8–23)
CO2: 18 mmol/L — ABNORMAL LOW (ref 22–32)
Calcium: 9 mg/dL (ref 8.9–10.3)
Chloride: 108 mmol/L (ref 98–111)
Creatinine, Ser: 3.92 mg/dL — ABNORMAL HIGH (ref 0.44–1.00)
GFR, Estimated: 10 mL/min — ABNORMAL LOW (ref >60)
Glucose, Bld: 95 mg/dL (ref 70–99)
Potassium: 3.7 mmol/L (ref 3.5–5.1)
Sodium: 138 mmol/L (ref 135–145)
Total Bilirubin: 0.7 mg/dL (ref 0.3–1.2)
Total Protein: 7 g/dL (ref 6.5–8.1)

## 2021-01-17 LAB — LACTIC ACID, PLASMA
Lactic Acid, Venous: 1.1 mmol/L (ref 0.5–1.9)
Lactic Acid, Venous: 1.1 mmol/L (ref 0.5–1.9)

## 2021-01-17 LAB — TROPONIN I (HIGH SENSITIVITY)
Troponin I (High Sensitivity): 12 ng/L (ref <18)
Troponin I (High Sensitivity): 16 ng/L (ref <18)

## 2021-01-17 LAB — RESP PANEL BY RT-PCR (FLU A&B, COVID) ARPGX2
Influenza A by PCR: NEGATIVE
Influenza B by PCR: NEGATIVE
SARS Coronavirus 2 by RT PCR: NEGATIVE

## 2021-01-17 LAB — LIPASE, BLOOD: Lipase: 39 U/L (ref 11–51)

## 2021-01-17 MED ORDER — SODIUM CHLORIDE 0.9 % IV SOLN
250.0000 mg | Freq: Two times a day (BID) | INTRAVENOUS | Status: DC
  Administered 2021-01-17: 250 mg via INTRAVENOUS
  Filled 2021-01-17 (×3): qty 2.5

## 2021-01-17 MED ORDER — ONDANSETRON HCL 4 MG PO TABS: 4.0000 mg | ORAL_TABLET | Freq: Four times a day (QID) | ORAL | Status: DC | PRN

## 2021-01-17 MED ORDER — SODIUM CHLORIDE 0.9 % IV SOLN
1.0000 g | INTRAVENOUS | Status: DC
  Administered 2021-01-17: 1 g via INTRAVENOUS
  Filled 2021-01-17: qty 10

## 2021-01-17 MED ORDER — ONDANSETRON HCL 4 MG/2ML IJ SOLN
4.0000 mg | Freq: Four times a day (QID) | INTRAMUSCULAR | Status: DC | PRN
  Administered 2021-01-23: 4 mg via INTRAVENOUS
  Filled 2021-01-17: qty 2

## 2021-01-17 MED ORDER — ENOXAPARIN SODIUM 40 MG/0.4ML IJ SOSY: 40.0000 mg | PREFILLED_SYRINGE | INTRAMUSCULAR | Status: DC

## 2021-01-17 MED ORDER — SODIUM CHLORIDE 0.9 % IV SOLN
1.0000 g | INTRAVENOUS | Status: DC
  Administered 2021-01-17 – 2021-01-19 (×3): 1 g via INTRAVENOUS
  Filled 2021-01-17 (×4): qty 1

## 2021-01-17 MED ORDER — SODIUM CHLORIDE 0.9 % IV BOLUS
1000.0000 mL | Freq: Once | INTRAVENOUS | Status: AC
  Administered 2021-01-17: 1000 mL via INTRAVENOUS

## 2021-01-17 MED ORDER — SODIUM CHLORIDE 0.9 % IV SOLN: Freq: Once | INTRAVENOUS | Status: AC

## 2021-01-17 MED ORDER — HEPARIN SODIUM (PORCINE) 5000 UNIT/ML IJ SOLN
5000.0000 [IU] | Freq: Three times a day (TID) | INTRAMUSCULAR | Status: DC
  Administered 2021-01-18 – 2021-01-24 (×21): 5000 [IU] via SUBCUTANEOUS
  Filled 2021-01-17 (×21): qty 1

## 2021-01-17 MED ORDER — METRONIDAZOLE 500 MG/100ML IV SOLN
500.0000 mg | Freq: Two times a day (BID) | INTRAVENOUS | Status: DC
  Administered 2021-01-17: 500 mg via INTRAVENOUS
  Filled 2021-01-17 (×2): qty 100

## 2021-01-17 MED ORDER — HYDROMORPHONE HCL 1 MG/ML IJ SOLN: 0.5000 mg | INTRAMUSCULAR | Status: DC | PRN

## 2021-01-17 MED ORDER — LACTATED RINGERS IV SOLN: INTRAVENOUS | Status: DC

## 2021-01-17 MED ORDER — HYDROMORPHONE HCL 1 MG/ML IJ SOLN
0.2500 mg | INTRAMUSCULAR | Status: DC | PRN
  Administered 2021-01-18 (×2): 0.5 mg via INTRAVENOUS
  Administered 2021-01-18 (×2): 1 mg via INTRAVENOUS
  Administered 2021-01-18: 0.5 mg via INTRAVENOUS
  Administered 2021-01-19 – 2021-01-22 (×10): 1 mg via INTRAVENOUS
  Filled 2021-01-17 (×15): qty 1

## 2021-01-17 NOTE — ED Triage Notes (Signed)
Pt bib [facility] from [place] at [facility] with complaints of right sided pain and swelling to right cheek. Pt states she noticed the pain and stiffness last night. Pt is wheelchair bound at baseline. AOx4. EMS vitals: 142/80, 60HR, 16R, 98%RA, 97.3T

## 2021-01-17 NOTE — ED Notes (Signed)
Patient transported to Ultrasound 

## 2021-01-17 NOTE — ED Provider Notes (Addendum)
[facility] EMERGENCY DEPARTMENT Provider Note   CSN: [id number] Arrival date & time: 01/17/21  1254     History Chief Complaint  Patient presents with   Facial Swelling   Pain    [name] [name] is a 87 y.o. female.  HPI    87 year old female with a history of hyperlipidemia, hypertension, epilepsy, macular degeneration, bladder cancer, coronary artery disease, history of subdural hematoma, CKD stage IV, gait imbalance, Foley catheter in place who presents with concern for right sided pain and weakness.  Reports going to bed around 8PM last night and waking with severe pain to the entire right side of her body. Reports pain to right arm, right chest, right abdomen, right leg as well as area of pain or swelling preauricularly.  Denies numbness. Reports she cannot move right lower extremity or right upper extremity due to both pain and weakness.  She denies cough, dyspnea, nausea, vomiting, diarrhea. No acute visual changes or speech changes  Has a history of low back pain which has been managed with hydrocodone and tizanidine.  Palliative care has been following.   Past Medical History:  Diagnosis Date   Abnormal gait    Asthenia    Back pain    Bladder cancer (HCC)    CAD (coronary artery disease)    Chronic pain    CKD (chronic kidney disease), stage IV (HCC)    Dizziness    Dysphagia    Epileptic grand mal status ([place])    Essential hypertension, benign    Hypokalemia    Insomnia    Iron deficiency    Macular degeneration    Mixed hyperlipidemia    Obesity    Postsurgical aortocoronary bypass status     Patient Active Problem List   Diagnosis Date Noted   Acute parotitis 01/17/2021   SDH (subdural hematoma) 06/05/2020   Recurrent UTI 03/11/2020   Exudative age-related macular degeneration of right eye with inactive choroidal neovascularization ([place]) 12/20/2019   Degenerative retinal drusen of both eyes 12/20/2019   Exudative age-related macular  degeneration of left eye with inactive choroidal neovascularization ([place]) 12/20/2019   Traumatic subdural hematoma 08/08/2018   Pyelonephritis 09/09/2016   Falls frequently 06/24/2016   Tension headache 11/10/2013   Chest pain 11/03/2011   ARF (acute renal failure) ([place]) 04/30/2011   UTI (urinary tract infection) 04/30/2011   CKD (chronic kidney disease), stage IV ([place]) 04/30/2011   Anemia 04/30/2011   S/p nephrectomy 04/30/2011   Macular degeneration 04/30/2011   Cataract 04/30/2011   Bladder cancer ([place]) 04/30/2011   Weakness generalized 04/29/2011   Seizure disorder ([place]) 04/29/2011   Benign hypertension 04/29/2011   CAD (coronary artery disease) 04/29/2011   Hyperlipidemia 04/29/2011   Anxiety 04/29/2011   Dizziness - light-headed 04/29/2011   Chronic abdominal pain 04/29/2011   Mixed hyperlipidemia 05/24/2008   Essential hypertension, benign 05/24/2008   Postsurgical aortocoronary bypass status 05/24/2008    Past Surgical History:  Procedure Laterality Date   CATARACT EXTRACTION     CORONARY ARTERY BYPASS GRAFT     IRIDECTOMY     NEPHRECTOMY  2000     OB History     Gravida  3   Para  3   Term  3   Preterm      AB      Living  3      SAB      IAB      Ectopic      Multiple  Live Births              Family History  Problem Relation Age of Onset   Colon cancer Mother    Heart attack Father    Diabetes Father    Stroke Other    Coronary artery disease Other    Cancer Other    Diabetes Other     Social History   Tobacco Use   Smoking status: Never   Smokeless tobacco: Never  Vaping Use   Vaping Use: Never used  Substance Use Topics   Alcohol use: No   Drug use: No    Home Medications Prior to Admission medications   Medication Sig Start Date End Date Taking? Authorizing Provider  acetaminophen (TYLENOL) 325 MG tablet Take 325 mg by mouth every 6 (six) hours as needed for mild pain.   Yes [provider]   amLODipine (NORVASC) 2.5 MG tablet Take 2.5 mg by mouth daily.   Yes [provider]  clonazePAM (KLONOPIN) 0.5 MG tablet Take 1 tablet (0.5 mg total) by mouth at bedtime. 06/07/20  Yes Bonnielee Haff, MD  Cranberry 450 MG TABS Take 450 mg by mouth daily.   Yes [provider]  diphenhydrAMINE (BENADRYL) 25 mg capsule Take 25 mg by mouth every 8 (eight) hours as needed for itching.   Yes [provider]  Eyelid Cleansers (EYESCRUB) PADS Apply 1 each topically daily.   Yes [provider]  gabapentin (NEURONTIN) 300 MG capsule Take 1 capsule (300 mg total) by mouth at bedtime. 08/10/18  Yes Domenic Polite, MD  HYDROcodone-acetaminophen (NORCO/VICODIN) 5-325 MG tablet Take 1 tablet by mouth every 8 (eight) hours. Patient taking differently: Take 1 tablet by mouth in the morning and at bedtime. 06/07/20  Yes Bonnielee Haff, MD  levETIRAcetam (KEPPRA) 250 MG tablet Take 500mg  twice daily till 06/13/20 and then 250mg  twice daily as before Patient taking differently: Take 250 mg by mouth 2 (two) times daily. 06/07/20  Yes Bonnielee Haff, MD  linaclotide [facility] 290 MCG CAPS capsule Take 290 mcg by mouth daily.   Yes [provider]  loperamide (IMODIUM) 2 MG capsule Take 2 mg by mouth every 4 (four) hours as needed for diarrhea or loose stools.   Yes [provider]  melatonin 3 MG TABS tablet Take 3 mg by mouth at bedtime.   Yes [provider]  Multiple Vitamins-Minerals (PRESERVISION AREDS PO) Take 1 capsule by mouth daily.   Yes [provider]  NONFORMULARY OR COMPOUNDED ITEM Apply 1 application topically in the morning and at bedtime. Anti -itch lotion 0.5-0.5% ( Mix with triamcinolone)   Yes [provider]  oxybutynin (DITROPAN-XL) 10 MG 24 hr tablet Take 10 mg by mouth daily.   Yes [provider]  phenol (CHLORASEPTIC) 1.4 % LIQD Use as directed 1 spray in the mouth or throat every 4 (four) hours as needed for  throat irritation / pain.   Yes [provider]  rosuvastatin (CRESTOR) 10 MG tablet Take 10 mg by mouth daily.  01/06/11  Yes [provider]  sertraline (ZOLOFT) 25 MG tablet Take 25 mg by mouth daily.   Yes [provider]  tizanidine (ZANAFLEX) 2 MG capsule Take 2 mg by mouth 2 (two) times daily as needed for muscle spasms.   Yes [provider]  triamcinolone cream (KENALOG) 0.1 % Apply 1 application topically 2 (two) times daily. Mix with Sarna lotion   Yes [provider]  Wheat Dextrin (  BENEFIBER) POWD Take 1 Scoop by mouth daily at 6 (six) AM.   Yes [provider]    Allergies    Adhesive [tape], Cephalosporins, Keflet [cephalexin], Latex, Macrobid [nitrofurantoin], Penicillins, and Sulfa antibiotics  Review of Systems   Review of Systems  Constitutional:  Negative for fever.  HENT:  Negative for sore throat.   Eyes:  Negative for visual disturbance.  Respiratory:  Negative for cough and shortness of breath.   Cardiovascular:  Positive for chest pain.  Gastrointestinal:  Positive for abdominal pain. Negative for diarrhea, nausea and vomiting.  Genitourinary:  Negative for difficulty urinating.  Musculoskeletal:  Positive for arthralgias and myalgias. Negative for back pain and neck pain.  Skin:  Negative for rash.  Neurological:  Positive for facial asymmetry and weakness. Negative for dizziness, syncope, speech difficulty, numbness and headaches.   Physical Exam Updated Vital Signs BP (!) 165/60 (BP Location: Left Arm)   Pulse 78   Temp (!) 97.5 F (36.4 C) (Oral)   Resp 20   Ht 5\' 2"  (1.575 m)   Wt 75.3 kg   SpO2 96%   BMI 30.36 kg/m   Physical Exam Vitals and nursing note reviewed.  Constitutional:      General: She is not in acute distress.    Appearance: She is well-developed. She is not diaphoretic.  HENT:     Head: Atraumatic.     Jaw: Swelling (extending superiorly, preauricular,) present. No trismus.      Salivary Glands: Right salivary gland is tender.  Eyes:     Conjunctiva/sclera: Conjunctivae normal.  Cardiovascular:     Rate and Rhythm: Normal rate and regular rhythm.     Heart sounds: Normal heart sounds. No murmur heard.   No friction rub. No gallop.  Pulmonary:     Effort: Pulmonary effort is normal. No respiratory distress.     Breath sounds: Normal breath sounds. No wheezing or rales.  Abdominal:     General: There is no distension.     Palpations: Abdomen is soft.     Tenderness: There is abdominal tenderness (right sided). There is no guarding.  Musculoskeletal:        General: No tenderness.     Cervical back: Normal range of motion.  Skin:    General: Skin is warm and dry.     Findings: No erythema or rash.  Neurological:     Mental Status: She is alert and oriented to person, place, and time.     Comments: Reports unable to lift right leg Right arm with pain when testing strength, able to push and pull to keep arm still but otherwise not following commands to lift right arm.  Does not try to lift right leg at all. Facies symmetric, normal EOM, visual fields, dysarthria,visual field normal    ED Results / Procedures / Treatments   Labs (all labs ordered are listed, but only abnormal results are displayed) Labs Reviewed  COMPREHENSIVE METABOLIC PANEL - Abnormal; Notable for the following components:      Result Value   CO2 18 (*)    BUN 36 (*)    Creatinine, Ser 3.92 (*)    Albumin 3.4 (*)    GFR, Estimated 10 (*)    All other components within normal limits  URINALYSIS, ROUTINE W REFLEX MICROSCOPIC - Abnormal; Notable for the following components:   APPearance TURBID (*)    Hgb urine dipstick MODERATE (*)    Ketones, ur 5 (*)    Protein,  ur 30 (*)    Nitrite POSITIVE (*)    Leukocytes,Ua LARGE (*)    WBC, UA >50 (*)    Bacteria, UA MANY (*)    Non Squamous Epithelial 0-5 (*)    All other components within normal limits  RESP PANEL BY RT-PCR (FLU A&B,  COVID) [id number]  URINE CULTURE  MRSA NEXT GEN BY PCR, NASAL  CBC WITH DIFFERENTIAL/PLATELET  LIPASE, BLOOD  LACTIC ACID, PLASMA  LACTIC ACID, PLASMA  BASIC METABOLIC PANEL  CBC  TROPONIN I (HIGH SENSITIVITY)  TROPONIN I (HIGH SENSITIVITY)    EKG EKG Interpretation  Date/Time:  Thursday January 17 2021 13:01:24 EDT Ventricular Rate:  57 PR Interval:  205 QRS Duration: 96 QT Interval:  458 QTC Calculation: 446 R Axis:   7 Text Interpretation: Sinus rhythm Low voltage, precordial leads Confirmed by Lennice Sites ([id number]) on 01/17/2021 3:14:22 PM  Radiology CT HEAD WO CONTRAST (5MM)  Result Date: 01/17/2021 CLINICAL DATA:  Nystagmus or other irregular eye movement right sided pain and weakness; maxillofacial pain eval parotid/submandibular swelling EXAM: CT HEAD WITHOUT CONTRAST CT MAXILLOFACIAL WITHOUT CONTRAST TECHNIQUE: Multidetector CT imaging of the head and maxillofacial structures were performed using the standard protocol without intravenous contrast. Multiplanar CT image reconstructions of the maxillofacial structures were also generated. COMPARISON:  06/06/2020 CT head FINDINGS: CT HEAD FINDINGS Brain: There is no acute intracranial hemorrhage, mass effect, or edema. Gray-white differentiation is preserved. The extra-axial collection along the left cerebral convexity has resolved. Falcine subdural hematoma is also expectedly resolved. Prominence of the ventricles and sulci reflects stable parenchymal volume loss. Patchy low-attenuation in the supratentorial white matter probably reflects stable chronic microvascular ischemic changes. Small chronic infarcts of the right corona radiata and basal ganglia as seen previously. Vascular: There is intracranial atherosclerotic calcification at the skull base. Skull: Unremarkable apart from right parietal craniotomy. Other: Mastoid air cells are clear. CT MAXILLOFACIAL FINDINGS Osseous: No acute abnormality. Orbits: Bilateral lens replacements.   Otherwise unremarkable. Sinuses: No significant opacification. Soft tissues: There is infiltration of the fat surrounding the right parotid. The inferior extent is not imaged. Right parotid is enlarged with increased density. No stones within the gland or duct. IMPRESSION: No acute intracranial abnormality. Subdural collections have resolved. Stable chronic/nonemergent findings detailed above. Findings most consistent with right parotitis. Electronically Signed   By: Macy Mis M.D.   On: 01/17/2021 16:43   CT Cervical Spine Wo Contrast  Result Date: 01/17/2021 CLINICAL DATA:  Cervical radiculopathy, infection suspected EXAM: CT CERVICAL SPINE WITHOUT CONTRAST TECHNIQUE: Multidetector CT imaging of the cervical spine was performed without intravenous contrast. Multiplanar CT image reconstructions were also generated. COMPARISON:  June 05, 2020 FINDINGS: Alignment: Stable. Skull base and vertebrae: Stable vertebral body heights. Partial fusion across the C2-C3 disc space is again noted. As before, there is fusion of the C2-C3 facets. There is no destructive osseous lesion. Soft tissues and spinal canal: No prevertebral fluid or swelling. No visible canal hematoma. Disc levels: Multilevel degenerative changes are present including disc space narrowing, endplate osteophytes, and facet and uncovertebral hypertrophy. These findings are not substantially changed from prior study. There is no high-grade osseous encroachment on the spinal canal. Foraminal narrowing is greatest on the left at C3-C4 and bilaterally at C5-C6. Upper chest: No apical lung mass. Other: Right periparotid inflammatory changes extend to the level of the hyoid. IMPRESSION: No acute abnormality or significant change since prior examination. Multilevel degenerative changes are stable. Electronically Signed   By: Addison Lank.D.  On: 01/17/2021 16:50   US RENAL  Result Date: 01/17/2021 CLINICAL DATA:  Acute renal insufficiency EXAM:  RENAL / URINARY TRACT ULTRASOUND COMPLETE COMPARISON:  01/17/2021 FINDINGS: Right Kidney: Surgically absent. Left Kidney: Renal measurements: 8.3 x 4.8 x 4.6 cm = volume: 96 mL. Renal cortical thinning, with increased renal cortical echotexture consistent with medical renal disease. No hydronephrosis or nephrolithiasis. Bladder: Foley catheter decompresses the bladder.  Evaluation is limited. Other: None. IMPRESSION: 1. Status post right nephrectomy. 2. Left renal cortical thinning and increased cortical echotexture consistent with medical renal disease. Electronically Signed   By: Randa Ngo M.D.   On: 01/17/2021 21:34   CT CHEST ABDOMEN PELVIS WO CONTRAST  Result Date: 01/17/2021 CLINICAL DATA:  Nonlocalized acute abdominal pain. EXAM: CT CHEST, ABDOMEN AND PELVIS WITHOUT CONTRAST TECHNIQUE: Multidetector CT imaging of the chest, abdomen and pelvis was performed following the standard protocol without IV contrast. COMPARISON:  CT abdomen pelvis 04/25/2020 FINDINGS: CT CHEST FINDINGS Cardiovascular: Normal heart size. No significant pericardial effusion. The thoracic aorta is normal in caliber. Moderate to severe atherosclerotic plaque of the thoracic aorta. Four-vessel coronary artery calcifications status post coronary artery bypass graft. Mediastinum/Nodes: No gross hilar adenopathy, noting limited sensitivity for the detection of hilar adenopathy on this noncontrast study. No enlarged mediastinal or axillary lymph nodes. Thyroid gland, trachea, and esophagus demonstrate no significant findings. Lungs/Pleura: Scattered punctate calcifications within the lungs likely sequelae of prior granulomatous disease. No pulmonary mass. Focal consolidation. Trace left pleural effusion. Associated left lower lobe passive atelectasis. No right pleural effusion. No pneumothorax. Musculoskeletal: No chest wall abnormality. No suspicious lytic or blastic osseous lesions. No acute displaced fracture. Sternotomy wires  appear intact. Severe degenerative changes of the left shoulder. CT ABDOMEN PELVIS FINDINGS Hepatobiliary: No focal liver abnormality. Nonspecific slightly hydropic gallbladder. No gallstones, gallbladder wall thickening, or pericholecystic fluid. No biliary dilatation. Pancreas: No focal lesion. Normal pancreatic contour. No surrounding inflammatory changes. No main pancreatic ductal dilatation. Spleen: Normal in size without focal abnormality. Adrenals/Urinary Tract: No adrenal nodule bilaterally. Status post right nephrectomy. Left renal cortical scarring. No nephrolithiasis and no hydronephrosis. Urothelial thickening of the left proximal ureter. No ureterolithiasis or hydroureter. Urinary bladder wall thickening and perivesicular fat stranding. Foley catheter terminates within the urinary bladder lumen. Stomach/Bowel: Stomach is within normal limits. Question incidentally noted partial small bowel malrotation. No evidence of bowel wall thickening or dilatation. Colonic diverticulosis. The appendix not definitely identified. Vascular/Lymphatic: No abdominal aorta or iliac aneurysm. Severe atherosclerotic plaque of the aorta and its branches. No abdominal, pelvic, or inguinal lymphadenopathy. Reproductive: Status post hysterectomy. No adnexal masses. Other: No intraperitoneal free fluid. No intraperitoneal free gas. No organized fluid collection. Musculoskeletal: No abdominal wall hernia or abnormality. No suspicious lytic or blastic osseous lesions. No acute displaced fracture. Severe degenerative changes of the lumbar spine. Kyphoplasty of the L1 level. Dextroscoliosis of the spine centered at the L1-L2 level. IMPRESSION: 1. Please note limited evaluation on this noncontrast study. 2. Trace left pleural effusion. 3. Urothelial thickening of the left proximal ureter as well as urinary bladder wall thickening and perivesicular fat stranding consistent with infection. Correlate with urinalysis. 4. Scattered  colonic diverticulosis with no acute diverticulitis. 5.  Aortic Atherosclerosis (ICD10-I70.0). 6. Status post right nephrectomy. Electronically Signed   By: Iven Finn M.D.   On: 01/17/2021 16:44   CT Maxillofacial Wo Contrast  Result Date: 01/17/2021 CLINICAL DATA:  Nystagmus or other irregular eye movement right sided pain and weakness; maxillofacial pain eval parotid/submandibular swelling  EXAM: CT HEAD WITHOUT CONTRAST CT MAXILLOFACIAL WITHOUT CONTRAST TECHNIQUE: Multidetector CT imaging of the head and maxillofacial structures were performed using the standard protocol without intravenous contrast. Multiplanar CT image reconstructions of the maxillofacial structures were also generated. COMPARISON:  06/06/2020 CT head FINDINGS: CT HEAD FINDINGS Brain: There is no acute intracranial hemorrhage, mass effect, or edema. Gray-white differentiation is preserved. The extra-axial collection along the left cerebral convexity has resolved. Falcine subdural hematoma is also expectedly resolved. Prominence of the ventricles and sulci reflects stable parenchymal volume loss. Patchy low-attenuation in the supratentorial white matter probably reflects stable chronic microvascular ischemic changes. Small chronic infarcts of the right corona radiata and basal ganglia as seen previously. Vascular: There is intracranial atherosclerotic calcification at the skull base. Skull: Unremarkable apart from right parietal craniotomy. Other: Mastoid air cells are clear. CT MAXILLOFACIAL FINDINGS Osseous: No acute abnormality. Orbits: Bilateral lens replacements.  Otherwise unremarkable. Sinuses: No significant opacification. Soft tissues: There is infiltration of the fat surrounding the right parotid. The inferior extent is not imaged. Right parotid is enlarged with increased density. No stones within the gland or duct. IMPRESSION: No acute intracranial abnormality. Subdural collections have resolved. Stable chronic/nonemergent  findings detailed above. Findings most consistent with right parotitis. Electronically Signed   By: Macy Mis M.D.   On: 01/17/2021 16:43    Procedures Procedures   Medications Ordered in ED Medications  levETIRAcetam (KEPPRA) 250 mg in sodium chloride 0.9 % 100 mL IVPB (has no administration in time range)  lactated ringers infusion ( Intravenous New Bag/Given 01/17/21 2017)  meropenem (MERREM) 1 g in sodium chloride 0.9 % 100 mL IVPB (1 g Intravenous New Bag/Given 01/17/21 2146)  ondansetron (ZOFRAN) tablet 4 mg (has no administration in time range)    Or  ondansetron (ZOFRAN) injection 4 mg (has no administration in time range)  heparin injection 5,000 Units (has no administration in time range)  HYDROmorphone (DILAUDID) injection 0.25-1 mg (has no administration in time range)  sodium chloride 0.9 % bolus 1,000 mL (0 mLs Intravenous Stopped 01/17/21 1642)  0.9 %  sodium chloride infusion (0 mLs Intravenous Stopped 01/17/21 2018)    ED Course  I have reviewed the triage vital signs and the nursing notes.  Pertinent labs & imaging results that were available during my care of the patient were reviewed by me and considered in my medical decision making (see chart for details).    MDM Rules/Calculators/A&P                            87 year old female with a history of hyperlipidemia, hypertension, epilepsy, macular degeneration, bladder cancer, coronary artery disease, history of subdural hematoma, CKD stage IV, gait imbalance, Foley catheter in place who presents with concern for right sided pain and weakness.  Has diffuse pain to right side of body, weakness also noted. Also has right sided facial swelling, possible parotid gland or other sialolithiasis. Discussed possible CTA given diffuse areas of pain, dissection on ddx, however with renal function so significant abnormal and she is palliative, agree to forego contrasted scan.  Consider CVA, dissection, parotitis, abscess,  pneumonia, intraabdominal etiology, spinal etiology with diffuse areas of pain and large ddx. Given right sided weakness/pain, will consult Neurology for further evaluation.  CT head, cspine, face, c/a/p without contrast pending to evaluate for other emergent diagnoses.  Has good pulses bilaterally, overall suspicion for dissection is low but did discuss this possibility with pt.  Final Clinical Impression(s) / ED Diagnoses Final diagnoses:  Pain  AKI (acute kidney injury) ([place])  Parotiditis  Urinary tract infection associated with indwelling urethral catheter, initial encounter [facility])    Rx / DC Orders ED Discharge Orders     None        Gareth Morgan, MD 01/17/21 2249    Gareth Morgan, MD 01/17/21 2251

## 2021-01-17 NOTE — Consult Note (Signed)
Neurology Consult H&P  [name] [name] MR# [id number] 01/17/2021  CC: right arm pain with decreased range of motion.  History is obtained from: patient and chart.  HPI: [name] [name] is a 87 y.o. female PMHx as reviewed below hyperlipidemia, hypertension, epilepsy, macular degeneration, bladder cancer, coronary artery disease, history of subdural hematoma, CKD stage IV, gait imbalance with right neck swelling and pain causing decreased range of motion in her right arm.  ROS: A complete ROS was performed and is negative except as noted in the HPI.  Past Medical History:  Diagnosis Date   Abnormal gait    Asthenia    Back pain    Bladder cancer (HCC)    CAD (coronary artery disease)    Chronic pain    CKD (chronic kidney disease), stage IV (HCC)    Dizziness    Dysphagia    Epileptic grand mal status ([place])    Essential hypertension, benign    Hypokalemia    Insomnia    Iron deficiency    Macular degeneration    Mixed hyperlipidemia    Obesity    Postsurgical aortocoronary bypass status      Family History  Problem Relation Age of Onset   Colon cancer Mother    Heart attack Father    Diabetes Father    Stroke Other    Coronary artery disease Other    Cancer Other    Diabetes Other     Social History:  reports that she has never smoked. She has never used smokeless tobacco. She reports that she does not drink alcohol and does not use drugs.   Prior to Admission medications   Medication Sig Start Date End Date Taking? Authorizing Provider  acetaminophen (TYLENOL) 325 MG tablet Take 325 mg by mouth every 6 (six) hours as needed for mild pain.   Yes [provider]  amLODipine (NORVASC) 2.5 MG tablet Take 2.5 mg by mouth daily.   Yes [provider]  clonazePAM (KLONOPIN) 0.5 MG tablet Take 1 tablet (0.5 mg total) by mouth at bedtime. 06/07/20  Yes Bonnielee Haff, MD  Cranberry 450 MG TABS Take 450 mg by mouth daily.   Yes [provider]   diphenhydrAMINE (BENADRYL) 25 mg capsule Take 25 mg by mouth every 8 (eight) hours as needed for itching.   Yes [provider]  Eyelid Cleansers (EYESCRUB) PADS Apply 1 each topically daily.   Yes [provider]  gabapentin (NEURONTIN) 300 MG capsule Take 1 capsule (300 mg total) by mouth at bedtime. 08/10/18  Yes Domenic Polite, MD  HYDROcodone-acetaminophen (NORCO/VICODIN) 5-325 MG tablet Take 1 tablet by mouth every 8 (eight) hours. Patient taking differently: Take 1 tablet by mouth in the morning and at bedtime. 06/07/20  Yes Bonnielee Haff, MD  levETIRAcetam (KEPPRA) 250 MG tablet Take 500mg  twice daily till 06/13/20 and then 250mg  twice daily as before Patient taking differently: Take 250 mg by mouth 2 (two) times daily. 06/07/20  Yes Bonnielee Haff, MD  linaclotide [facility]) 290 MCG CAPS capsule Take 290 mcg by mouth daily.   Yes [provider]  loperamide (IMODIUM) 2 MG capsule Take 2 mg by mouth every 4 (four) hours as needed for diarrhea or loose stools.   Yes [provider]  melatonin 3 MG TABS tablet Take 3 mg by mouth at bedtime.   Yes [provider]  Multiple Vitamins-Minerals (PRESERVISION AREDS PO) Take 1 capsule by mouth daily.   Yes [provider]  NONFORMULARY OR COMPOUNDED ITEM Apply 1 application topically in the morning and at bedtime. Anti -itch lotion 0.5-0.5% ( Mix with triamcinolone)   Yes [provider]  oxybutynin (DITROPAN-XL) 10 MG 24 hr tablet Take 10 mg by mouth daily.   Yes [provider]  phenol (CHLORASEPTIC) 1.4 % LIQD Use as directed 1 spray in the mouth or throat every 4 (four) hours as needed for throat irritation / pain.   Yes [provider]  rosuvastatin (CRESTOR) 10 MG tablet Take 10 mg by mouth daily.  01/06/11  Yes [provider]  sertraline (ZOLOFT) 25 MG tablet Take 25 mg by mouth daily.   Yes [provider]  tizanidine (ZANAFLEX) 2 MG capsule Take 2  mg by mouth 2 (two) times daily as needed for muscle spasms.   Yes [provider]  triamcinolone cream (KENALOG) 0.1 % Apply 1 application topically 2 (two) times daily. Mix with Sarna lotion   Yes [provider]  Wheat Dextrin (BENEFIBER) POWD Take 1 Scoop by mouth daily at 6 (six) AM.   Yes [provider]   Exam: Current vital signs: BP (!) 153/64   Pulse 76   Temp (!) 97.5 F (36.4 C) (Oral)   Resp 20   Ht 5\' 2"  (1.575 m)   Wt 75.3 kg   SpO2 98%   BMI 30.36 kg/m   Physical Exam  Constitutional: Appears well-developed and well-nourished.  Psych: Affect appropriate to situation Eyes: No scleral injection HENT: No OP obstruction. Neck swelling in the right submandibular region exquisitely painful to palpation Head: Normocephalic.  Cardiovascular: Normal rate and regular rhythm.  Respiratory: Effort normal, symmetric excursions bilaterally, no audible wheezing. GI: Soft.  No distension. There is no tenderness.  Skin: WDI  Neuro: Mental Status: Patient is awake, alert, oriented to person, place, month, year, and situation. Patient is able to give a clear and coherent history. Paucity of speech. Trismus. Intact comprehension and repetition. No signs of aphasia or neglect. Visual Fields are full. Pupils are equal, round, and reactive to light. EOMI without ptosis or diploplia.  Facial sensation is symmetric to temperature Facial movement is symmetric.  Hearing is intact to voice. Uvula midline and palate elevates symmetrically. Shoulder shrug is symmetric. Tongue is midline without atrophy or fasciculations.  Tone is normal. Right upper extremity with good strength however limited by pain. Sensation is symmetric to light touch and temperature in the arms and legs. Deep Tendon Reflexes: 2+ and symmetric in the biceps and patellae. Toes are downgoing bilaterally. FNF and HKS are intact bilaterally. Gait - Deferred  I have reviewed labs in  epic and the pertinent results are: Na 138.  I have reviewed the images obtained: None available  Assessment: [name] [name] is a 87 y.o. female PMHx as above with trismus and right submandibular swelling which is exquisitely painful to palpation causing pain limited decreased ROM in RUE.  Impression:  Trismus. Right parotitis.  CT head and neck. Recommend ABX with good parotid penetration.  Electronically signed by:  Lynnae Sandhoff, MD Page: [phone] 01/17/2021, 6:41 PM

## 2021-01-17 NOTE — ED Notes (Signed)
Pt unable to raise right arm up due to pain. Sensation is equal bilaterally

## 2021-01-17 NOTE — ED Notes (Signed)
Sister Gwenevere Abbot [phone] would like an update

## 2021-01-17 NOTE — Progress Notes (Signed)
Pharmacy Antibiotic Note  [name] [name] is a 87 y.o. female admitted on 01/17/2021 presenting with submandibular swelling, right-sided body pain and weakness.  Hx ESBL E. Coli in UCx 2021.  Pharmacy has been consulted for Merrem dosing.  AoCKD, SCr 3.92  Plan: Merrem 1g IV q24 hours Monitor renal function, Cx, clinical progression and ability to narrow  Height: 5\' 2"  (157.5 cm) Weight: 75.3 kg (166 lb) IBW/kg (Calculated) : 50.1  Temp (24hrs), Avg:97.5 F (36.4 C), Min:97.5 F (36.4 C), Max:97.5 F (36.4 C)  Recent Labs  Lab 01/17/21 1456 01/17/21 1539 01/17/21 1749  WBC  --  9.5  --   CREATININE  --  3.92*  --   LATICACIDVEN 1.1  --  1.1    Estimated Creatinine Clearance: 9.1 mL/min (A) (by C-G formula based on SCr of 3.92 mg/dL (H)).    Allergies  Allergen Reactions   Adhesive [Tape] Other (See Comments)    Tears patients skin off.    Cephalosporins Other (See Comments)    unknown   Keflet [Cephalexin] Nausea And Vomiting   Latex Itching   Macrobid [Nitrofurantoin] Other (See Comments)    unknown   Penicillins Hives and Itching   Sulfa Antibiotics Hives and Itching   Bertis Ruddy, PharmD Clinical Pharmacist ED Pharmacist Phone # [phone] 01/17/2021 8:18 PM

## 2021-01-17 NOTE — H&P (Addendum)
History and Physical    [name] [name] YCX:[id number] DOB: [birth year] DOA: 01/17/2021  PCP: Celene Squibb, MD  Patient coming from: [place] have personally briefly reviewed patient's old medical records in [place]  Chief Complaint: R facial and neck pain  HPI: [name] [name] is a 87 y.o. female with medical history significant of CKD 4, bladder CA, HTN, seizure disorder, chronic back and abdominal pain, recurrent UTIs, chronic foley use, wheelchair bound at baseline.  Pt with h/o ESBL e.coli UTIs in 2021.  Pt presents to ED with c/o R sided facial and neck pain and swelling.  Onset with pain and stiffness last night.  Symptoms worsened today with worsening trismus.  Pain with palpation or movement.  Nothing makes symptoms better.  Pt has dentures (has no teeth left).  Pain causing decreased ROM of R arm.   ED Course: Seen by neurology who have low suspicion of stroke.  CT head/c-spine and maxilofacial: Pt has R parotitis, inferior aspect not evaluated by exam.  UA with large LE, positive, nitrites, > 50 WBC.  Creat 3.9 up from 2.5 in March.  Though BUN of 36 is unchanged from March.  Bicarb 18.  Lactate nl, WBC nl.   Review of Systems: As per HPI, otherwise all review of systems negative.  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 She has never used  smokeless tobacco. She reports that she does not drink alcohol and does not use drugs.  Allergies  Allergen Reactions   Adhesive [Tape] Other (See Comments)    Tears patients skin off.    Cephalosporins Other (See Comments)    unknown   Keflet [Cephalexin] Nausea And Vomiting   Latex Itching   Macrobid [Nitrofurantoin] Other (See Comments)    unknown   Penicillins Hives and Itching   Sulfa Antibiotics Hives and Itching    Family History  Problem Relation Age of Onset   Colon cancer Mother    Heart attack Father    Diabetes Father    Stroke Other    Coronary artery disease Other    Cancer Other    Diabetes Other      Prior to Admission medications   Medication Sig Start Date End Date Taking? Authorizing Provider  acetaminophen (TYLENOL) 325 MG tablet Take 325 mg by mouth every 6 (six) hours as needed for mild pain.   Yes [provider]  amLODipine (NORVASC) 2.5 MG tablet Take 2.5 mg by mouth daily.   Yes [provider]  clonazePAM (KLONOPIN) 0.5 MG tablet Take 1 tablet (0.5 mg total) by mouth at bedtime. 06/07/20  Yes Bonnielee Haff, MD  Cranberry 450 MG  TABS Take 450 mg by mouth daily.   Yes [provider]  diphenhydrAMINE (BENADRYL) 25 mg capsule Take 25 mg by mouth every 8 (eight) hours as needed for itching.   Yes [provider]  Eyelid Cleansers (EYESCRUB) PADS Apply 1 each topically daily.   Yes [provider]  gabapentin (NEURONTIN) 300 MG capsule Take 1 capsule (300 mg total) by mouth at bedtime. 08/10/18  Yes Domenic Polite, MD  HYDROcodone-acetaminophen (NORCO/VICODIN) 5-325 MG tablet Take 1 tablet by mouth every 8 (eight) hours. Patient taking differently: Take 1 tablet by mouth in the morning and at bedtime. 06/07/20  Yes Bonnielee Haff, MD  levETIRAcetam (KEPPRA) 250 MG tablet Take 500mg  twice daily till 06/13/20 and then 250mg  twice daily as before Patient taking differently: Take 250 mg by mouth 2 (two) times daily.  06/07/20  Yes Bonnielee Haff, MD  linaclotide [facility]) 290 MCG CAPS capsule Take 290 mcg by mouth daily.   Yes [provider]  loperamide (IMODIUM) 2 MG capsule Take 2 mg by mouth every 4 (four) hours as needed for diarrhea or loose stools.   Yes [provider]  melatonin 3 MG TABS tablet Take 3 mg by mouth at bedtime.   Yes [provider]  Multiple Vitamins-Minerals (PRESERVISION AREDS PO) Take 1 capsule by mouth daily.   Yes [provider]  NONFORMULARY OR COMPOUNDED ITEM Apply 1 application topically in the morning and at bedtime. Anti -itch lotion 0.5-0.5% ( Mix with triamcinolone)   Yes [provider]  oxybutynin (DITROPAN-XL) 10 MG 24 hr tablet Take 10 mg by mouth daily.   Yes [provider]  phenol (CHLORASEPTIC) 1.4 % LIQD Use as directed 1 spray in the mouth or throat every 4 (four) hours as needed for throat irritation / pain.   Yes [provider]  rosuvastatin (CRESTOR) 10 MG tablet Take 10 mg by mouth daily.  01/06/11  Yes [provider]  sertraline (ZOLOFT) 25 MG tablet Take 25 mg by mouth daily.   Yes [provider]  tizanidine (ZANAFLEX) 2 MG capsule Take 2 mg by mouth 2 (two) times daily as needed for muscle spasms.   Yes [provider]  triamcinolone cream (KENALOG) 0.1 % Apply 1 application topically 2 (two) times daily. Mix with Sarna lotion   Yes [provider]  Wheat Dextrin (BENEFIBER) POWD Take 1 Scoop by mouth daily at 6 (six) AM.   Yes [provider]    Physical Exam: Vitals:   01/17/21 1830 01/17/21 1845 01/17/21 1900 01/17/21 2007  BP: (!) 153/64 (!) 156/59 111/80 (!) 169/55  Pulse: 76 75 79 76  Resp: 20 15 12 17   Temp:      TempSrc:      SpO2: 98% 99% 97% 97%  Weight:      Height:        Constitutional: NAD, calm, comfortable Eyes: PERRL, lids and conjunctivae normal ENMT: Mucous membranes are Dry. Trismus present, cant get good look at  salivary duct. Neck: Erythema, edema, and TTP to R parotid gland, R face, R submandibular space, and infection appears to be tracking down the R SCM.  L side appears spared at this point. Respiratory: clear to auscultation bilaterally, no wheezing, no crackles. Normal respiratory effort. No accessory muscle use.  Cardiovascular: Regular rate and rhythm, no murmurs / rubs / gallops. No extremity edema. 2+ pedal pulses. No carotid bruits.  Abdomen: no tenderness, no masses palpated. No hepatosplenomegaly. Bowel sounds positive.  Musculoskeletal: no clubbing / cyanosis. No joint deformity upper and lower extremities. Good ROM, no contractures. Normal muscle tone.  Skin: no rashes, lesions, ulcers. No induration Neurologic: CN 2-12 grossly intact. Sensation intact, DTR normal. Strength 5/5 in all 4.  Psychiatric: Normal judgment and insight. Alert and oriented x 3. Normal mood.    Labs on Admission: I have personally reviewed following labs and imaging studies  CBC: Recent Labs  Lab 01/17/21 1539  WBC 9.5  NEUTROABS 6.1  HGB 12.8  HCT 39.8  MCV 91.1  PLT 983   Basic Metabolic Panel: Recent Labs  Lab 01/17/21 1539  NA 138  K 3.7  CL 108  CO2 18*  GLUCOSE 95  BUN 36*  CREATININE 3.92*  CALCIUM 9.0   GFR: Estimated Creatinine Clearance: 9.1 mL/min (A) (by C-G formula based on SCr of 3.92 mg/dL (H)). Liver Function Tests: Recent Labs  Lab 01/17/21 1539  AST 25  ALT 11  ALKPHOS 72  BILITOT 0.7  PROT 7.0  ALBUMIN 3.4*   Recent Labs  Lab 01/17/21 1539  LIPASE 39   No results for input(s): AMMONIA in the last 168 hours. Coagulation Profile: No results for input(s): INR, PROTIME in the last 168 hours. Cardiac Enzymes: No results for input(s): CKTOTAL, CKMB, CKMBINDEX, TROPONINI in the last 168 hours. BNP (last 3 results) No results for input(s): PROBNP in the last 8760 hours. HbA1C: No results for input(s): HGBA1C in the last 72 hours. CBG: No results for input(s):  GLUCAP in the last 168 hours. Lipid Profile: No results for input(s): CHOL, HDL, LDLCALC, TRIG, CHOLHDL, LDLDIRECT in the last 72 hours. Thyroid Function Tests: No results for input(s): TSH, T4TOTAL, FREET4, T3FREE, THYROIDAB in the last 72 hours. Anemia Panel: No results for input(s): VITAMINB12, FOLATE, FERRITIN, TIBC, IRON, RETICCTPCT in the last 72 hours. Urine analysis:    Component Value Date/Time   COLORURINE YELLOW 01/17/2021 1603   APPEARANCEUR TURBID (A) 01/17/2021 1603   LABSPEC 1.009 01/17/2021 1603   PHURINE 5.0 01/17/2021 1603   GLUCOSEU NEGATIVE 01/17/2021 1603   HGBUR MODERATE (A) 01/17/2021 1603   BILIRUBINUR NEGATIVE 01/17/2021 1603   KETONESUR 5 (A) 01/17/2021 1603   PROTEINUR 30 (A) 01/17/2021 1603   UROBILINOGEN 0.2 06/11/2013 2102   NITRITE POSITIVE (A) 01/17/2021 1603   LEUKOCYTESUR LARGE (A) 01/17/2021 1603    Radiological Exams on Admission: CT HEAD WO CONTRAST (5MM)  Result Date: 01/17/2021 CLINICAL DATA:  Nystagmus or other irregular eye movement right sided pain and weakness; maxillofacial pain eval parotid/submandibular swelling EXAM: CT HEAD WITHOUT CONTRAST CT MAXILLOFACIAL WITHOUT CONTRAST TECHNIQUE: Multidetector CT imaging of the head and maxillofacial structures were performed using the standard protocol without intravenous contrast. Multiplanar CT image reconstructions of the maxillofacial structures were also generated. COMPARISON:  06/06/2020 CT head FINDINGS: CT HEAD FINDINGS Brain: There is no acute intracranial hemorrhage, mass effect, or edema. Gray-white differentiation is preserved. The extra-axial collection along the left cerebral convexity has resolved. Falcine subdural hematoma is also expectedly resolved. Prominence of the ventricles and sulci reflects stable parenchymal volume loss. Patchy low-attenuation in the supratentorial white matter probably reflects stable chronic microvascular ischemic changes. Small chronic infarcts of the right  corona radiata and basal ganglia as seen previously. Vascular: There is intracranial atherosclerotic calcification at the skull base. Skull: Unremarkable apart from right parietal craniotomy. Other: Mastoid air cells are clear. CT MAXILLOFACIAL FINDINGS Osseous: No acute abnormality. Orbits: Bilateral lens replacements.  Otherwise unremarkable. Sinuses: No significant opacification. Soft tissues: There  is infiltration of the fat surrounding the right parotid. The inferior extent is not imaged. Right parotid is enlarged with increased density. No stones within the gland or duct. IMPRESSION: No acute intracranial abnormality. Subdural collections have resolved. Stable chronic/nonemergent findings detailed above. Findings most consistent with right parotitis. Electronically Signed   By: Macy Mis M.D.   On: 01/17/2021 16:43   CT Cervical Spine Wo Contrast  Result Date: 01/17/2021 CLINICAL DATA:  Cervical radiculopathy, infection suspected EXAM: CT CERVICAL SPINE WITHOUT CONTRAST TECHNIQUE: Multidetector CT imaging of the cervical spine was performed without intravenous contrast. Multiplanar CT image reconstructions were also generated. COMPARISON:  June 05, 2020 FINDINGS: Alignment: Stable. Skull base and vertebrae: Stable vertebral body heights. Partial fusion across the C2-C3 disc space is again noted. As before, there is fusion of the C2-C3 facets. There is no destructive osseous lesion. Soft tissues and spinal canal: No prevertebral fluid or swelling. No visible canal hematoma. Disc levels: Multilevel degenerative changes are present including disc space narrowing, endplate osteophytes, and facet and uncovertebral hypertrophy. These findings are not substantially changed from prior study. There is no high-grade osseous encroachment on the spinal canal. Foraminal narrowing is greatest on the left at C3-C4 and bilaterally at C5-C6. Upper chest: No apical lung mass. Other: Right periparotid inflammatory  changes extend to the level of the hyoid. IMPRESSION: No acute abnormality or significant change since prior examination. Multilevel degenerative changes are stable. Electronically Signed   By: Macy Mis M.D.   On: 01/17/2021 16:50   CT CHEST ABDOMEN PELVIS WO CONTRAST  Result Date: 01/17/2021 CLINICAL DATA:  Nonlocalized acute abdominal pain. EXAM: CT CHEST, ABDOMEN AND PELVIS WITHOUT CONTRAST TECHNIQUE: Multidetector CT imaging of the chest, abdomen and pelvis was performed following the standard protocol without IV contrast. COMPARISON:  CT abdomen pelvis 04/25/2020 FINDINGS: CT CHEST FINDINGS Cardiovascular: Normal heart size. No significant pericardial effusion. The thoracic aorta is normal in caliber. Moderate to severe atherosclerotic plaque of the thoracic aorta. Four-vessel coronary artery calcifications status post coronary artery bypass graft. Mediastinum/Nodes: No gross hilar adenopathy, noting limited sensitivity for the detection of hilar adenopathy on this noncontrast study. No enlarged mediastinal or axillary lymph nodes. Thyroid gland, trachea, and esophagus demonstrate no significant findings. Lungs/Pleura: Scattered punctate calcifications within the lungs likely sequelae of prior granulomatous disease. No pulmonary mass. Focal consolidation. Trace left pleural effusion. Associated left lower lobe passive atelectasis. No right pleural effusion. No pneumothorax. Musculoskeletal: No chest wall abnormality. No suspicious lytic or blastic osseous lesions. No acute displaced fracture. Sternotomy wires appear intact. Severe degenerative changes of the left shoulder. CT ABDOMEN PELVIS FINDINGS Hepatobiliary: No focal liver abnormality. Nonspecific slightly hydropic gallbladder. No gallstones, gallbladder wall thickening, or pericholecystic fluid. No biliary dilatation. Pancreas: No focal lesion. Normal pancreatic contour. No surrounding inflammatory changes. No main pancreatic ductal  dilatation. Spleen: Normal in size without focal abnormality. Adrenals/Urinary Tract: No adrenal nodule bilaterally. Status post right nephrectomy. Left renal cortical scarring. No nephrolithiasis and no hydronephrosis. Urothelial thickening of the left proximal ureter. No ureterolithiasis or hydroureter. Urinary bladder wall thickening and perivesicular fat stranding. Foley catheter terminates within the urinary bladder lumen. Stomach/Bowel: Stomach is within normal limits. Question incidentally noted partial small bowel malrotation. No evidence of bowel wall thickening or dilatation. Colonic diverticulosis. The appendix not definitely identified. Vascular/Lymphatic: No abdominal aorta or iliac aneurysm. Severe atherosclerotic plaque of the aorta and its branches. No abdominal, pelvic, or inguinal lymphadenopathy. Reproductive: Status post hysterectomy. No adnexal masses. Other: No intraperitoneal free  fluid. No intraperitoneal free gas. No organized fluid collection. Musculoskeletal: No abdominal wall hernia or abnormality. No suspicious lytic or blastic osseous lesions. No acute displaced fracture. Severe degenerative changes of the lumbar spine. Kyphoplasty of the L1 level. Dextroscoliosis of the spine centered at the L1-L2 level. IMPRESSION: 1. Please note limited evaluation on this noncontrast study. 2. Trace left pleural effusion. 3. Urothelial thickening of the left proximal ureter as well as urinary bladder wall thickening and perivesicular fat stranding consistent with infection. Correlate with urinalysis. 4. Scattered colonic diverticulosis with no acute diverticulitis. 5.  Aortic Atherosclerosis (ICD10-I70.0). 6. Status post right nephrectomy. Electronically Signed   By: Iven Finn M.D.   On: 01/17/2021 16:44   CT Maxillofacial Wo Contrast  Result Date: 01/17/2021 CLINICAL DATA:  Nystagmus or other irregular eye movement right sided pain and weakness; maxillofacial pain eval  parotid/submandibular swelling EXAM: CT HEAD WITHOUT CONTRAST CT MAXILLOFACIAL WITHOUT CONTRAST TECHNIQUE: Multidetector CT imaging of the head and maxillofacial structures were performed using the standard protocol without intravenous contrast. Multiplanar CT image reconstructions of the maxillofacial structures were also generated. COMPARISON:  06/06/2020 CT head FINDINGS: CT HEAD FINDINGS Brain: There is no acute intracranial hemorrhage, mass effect, or edema. Gray-white differentiation is preserved. The extra-axial collection along the left cerebral convexity has resolved. Falcine subdural hematoma is also expectedly resolved. Prominence of the ventricles and sulci reflects stable parenchymal volume loss. Patchy low-attenuation in the supratentorial white matter probably reflects stable chronic microvascular ischemic changes. Small chronic infarcts of the right corona radiata and basal ganglia as seen previously. Vascular: There is intracranial atherosclerotic calcification at the skull base. Skull: Unremarkable apart from right parietal craniotomy. Other: Mastoid air cells are clear. CT MAXILLOFACIAL FINDINGS Osseous: No acute abnormality. Orbits: Bilateral lens replacements.  Otherwise unremarkable. Sinuses: No significant opacification. Soft tissues: There is infiltration of the fat surrounding the right parotid. The inferior extent is not imaged. Right parotid is enlarged with increased density. No stones within the gland or duct. IMPRESSION: No acute intracranial abnormality. Subdural collections have resolved. Stable chronic/nonemergent findings detailed above. Findings most consistent with right parotitis. Electronically Signed   By: Macy Mis M.D.   On: 01/17/2021 16:43    EKG: Independently reviewed.  Assessment/Plan Principal Problem:   Acute parotitis Active Problems:   Seizure disorder ([place])   Benign hypertension   ARF (acute renal failure) (HCC)   CKD (chronic kidney disease), stage  IV (HCC)   Bladder cancer (HCC)   Recurrent UTI    Acute parotitis - With infection extending into R submandibular space and down right SCM Given h/o ESBL and extent of infection / risk of worsening: will use merrem empirically for the moment. Check MRSA PCR nares STAT, if positive add MRSA coverage Dilaudid PRN for pain if needed AKI on CKD 4 - Suspect dehydration playing a role, may also be some component of progression of CKD IVF: 1L bolus in ED then LR at 100 Strict intake and output Check renal US (h/o nephrectomy) want to r/o obstruction Repeat BMP in AM Seizure disorder - Ordered her Keppra IV in case unable to take POs HTN - Add PRN med if needed if unable to take POs Bladder CA - Chronic, with chronic foley use Sees pal care CAUTI vs colonization - Urine Cx pending On merrem for parotid infection  DVT prophylaxis: Heparin [place] Code Status: DNR Family Communication: No family in room Disposition Plan: SNF after admit Consults called: Neuro, see note Admission status: Place in obs  Sharday Michl Jerilynn Mages DO [facility]  How to contact the [facility] Attending or Consulting provider [place] or covering provider during after hours [name], for this patient?  Check the care team in [facility] and look for a) attending/consulting [facility] provider listed and b) the [facility] team listed Log into [email or web address]  Amion Physician Scheduling and messaging for groups and whole hospitals  On call and physician scheduling software for group practices, residents, hospitalists and other medical providers for call, clinic, rotation and shift schedules. On[employer] is a hospital-wide system for scheduling doctors and paging doctors on call. EasyPlot is for scientific plotting and data analysis.  [email or web address]  and use [place]'s universal password to access. If you do not have the password, please contact the hospital operator.  Locate the [facility] provider you are looking for under [facility] and page to a number that you can be directly reached. If you still have difficulty reaching the provider, please page the [facility] (Director on Call) for the Hospitalists listed on amion for assistance.  01/17/2021, 8:52 PM

## 2021-01-17 NOTE — ED Provider Notes (Addendum)
  I personally evaluated the patient during the encounter and completed a history, physical, procedures, medical decision making to contribute to the overall care of the patient and decision making for the patient briefly, the patient is a 87 y.o. female with history of bladder cancer with chronic Foley catheter, CKD, CAD who presents to the ED with right-sided facial pain, right-sided body pain and weakness.  Overall I assumed care of this patient from prior ED doc at 4:30 PM.  Infectious work-up was initiated.  It appears that she likely has parotid swelling to the right side of her face.  Neurologically she appears intact but difficult to assess as she is having significant pain through the right side of her body.  She is in a wheelchair at baseline.  Neurology was consulted to come evaluate but have low suspicion for stroke.  Medically she looks very dehydrated.  Lab work including CT scans of her head, neck, chest, abdomen, pelvis have been ordered.  Overall labs are significant for elevated creatinine at almost double her baseline.  CT scan show a right-sided parotiditis.  She also appears to have a catheter associated UTI.  Pharmacy to give abx recs given allergies.  She does not appear to be septic.  But she would benefit from IV antibiotics and IV fluids.    This chart was dictated using voice recognition software.  Despite best efforts to proofread,  errors can occur which can change the documentation meaning.    EKG Interpretation  Date/Time:  Thursday January 17 2021 13:01:24 EDT Ventricular Rate:  57 PR Interval:  205 QRS Duration: 96 QT Interval:  458 QTC Calculation: 446 R Axis:   7 Text Interpretation: Sinus rhythm Low voltage, precordial leads Confirmed by Lennice Sites ([id number]) on 01/17/2021 3:14:22 PM            Lennice Sites, DO 01/17/21 1941    Ronnald Nian, [place], DO 01/17/21 1943    Lennice Sites, DO 01/17/21 1950

## 2021-01-18 ENCOUNTER — Encounter (HOSPITAL_COMMUNITY): Payer: Self-pay | Admitting: Internal Medicine

## 2021-01-18 DIAGNOSIS — C679 Malignant neoplasm of bladder, unspecified: Secondary | ICD-10-CM | POA: Diagnosis present

## 2021-01-18 DIAGNOSIS — R519 Headache, unspecified: Secondary | ICD-10-CM | POA: Diagnosis present

## 2021-01-18 DIAGNOSIS — G40909 Epilepsy, unspecified, not intractable, without status epilepticus: Secondary | ICD-10-CM | POA: Diagnosis present

## 2021-01-18 DIAGNOSIS — I2510 Atherosclerotic heart disease of native coronary artery without angina pectoris: Secondary | ICD-10-CM | POA: Diagnosis present

## 2021-01-18 DIAGNOSIS — Z8249 Family history of ischemic heart disease and other diseases of the circulatory system: Secondary | ICD-10-CM | POA: Diagnosis not present

## 2021-01-18 DIAGNOSIS — G8929 Other chronic pain: Secondary | ICD-10-CM | POA: Diagnosis present

## 2021-01-18 DIAGNOSIS — Z951 Presence of aortocoronary bypass graft: Secondary | ICD-10-CM | POA: Diagnosis not present

## 2021-01-18 DIAGNOSIS — H3530 Unspecified macular degeneration: Secondary | ICD-10-CM | POA: Diagnosis present

## 2021-01-18 DIAGNOSIS — E782 Mixed hyperlipidemia: Secondary | ICD-10-CM | POA: Diagnosis present

## 2021-01-18 DIAGNOSIS — E876 Hypokalemia: Secondary | ICD-10-CM | POA: Diagnosis present

## 2021-01-18 DIAGNOSIS — Z87440 Personal history of urinary (tract) infections: Secondary | ICD-10-CM | POA: Diagnosis not present

## 2021-01-18 DIAGNOSIS — G4700 Insomnia, unspecified: Secondary | ICD-10-CM | POA: Diagnosis present

## 2021-01-18 DIAGNOSIS — Z66 Do not resuscitate: Secondary | ICD-10-CM | POA: Diagnosis present

## 2021-01-18 DIAGNOSIS — Z20822 Contact with and (suspected) exposure to covid-19: Secondary | ICD-10-CM | POA: Diagnosis present

## 2021-01-18 DIAGNOSIS — I129 Hypertensive chronic kidney disease with stage 1 through stage 4 chronic kidney disease, or unspecified chronic kidney disease: Secondary | ICD-10-CM | POA: Diagnosis present

## 2021-01-18 DIAGNOSIS — I10 Essential (primary) hypertension: Secondary | ICD-10-CM | POA: Diagnosis not present

## 2021-01-18 DIAGNOSIS — T380X5A Adverse effect of glucocorticoids and synthetic analogues, initial encounter: Secondary | ICD-10-CM | POA: Diagnosis not present

## 2021-01-18 DIAGNOSIS — E860 Dehydration: Secondary | ICD-10-CM | POA: Diagnosis present

## 2021-01-18 DIAGNOSIS — Z905 Acquired absence of kidney: Secondary | ICD-10-CM | POA: Diagnosis not present

## 2021-01-18 DIAGNOSIS — K1121 Acute sialoadenitis: Secondary | ICD-10-CM | POA: Diagnosis present

## 2021-01-18 DIAGNOSIS — D72829 Elevated white blood cell count, unspecified: Secondary | ICD-10-CM | POA: Diagnosis not present

## 2021-01-18 DIAGNOSIS — N184 Chronic kidney disease, stage 4 (severe): Secondary | ICD-10-CM | POA: Diagnosis present

## 2021-01-18 DIAGNOSIS — N179 Acute kidney failure, unspecified: Secondary | ICD-10-CM | POA: Diagnosis present

## 2021-01-18 DIAGNOSIS — F32A Depression, unspecified: Secondary | ICD-10-CM | POA: Diagnosis present

## 2021-01-18 DIAGNOSIS — Z993 Dependence on wheelchair: Secondary | ICD-10-CM | POA: Diagnosis not present

## 2021-01-18 LAB — MRSA NEXT GEN BY PCR, NASAL: MRSA by PCR Next Gen: NOT DETECTED

## 2021-01-18 LAB — BASIC METABOLIC PANEL
Anion gap: 16 — ABNORMAL HIGH (ref 5–15)
BUN: 32 mg/dL — ABNORMAL HIGH (ref 8–23)
CO2: 13 mmol/L — ABNORMAL LOW (ref 22–32)
Calcium: 8.9 mg/dL (ref 8.9–10.3)
Chloride: 110 mmol/L (ref 98–111)
Creatinine, Ser: 3.29 mg/dL — ABNORMAL HIGH (ref 0.44–1.00)
GFR, Estimated: 13 mL/min — ABNORMAL LOW (ref >60)
Glucose, Bld: 133 mg/dL — ABNORMAL HIGH (ref 70–99)
Potassium: 4.1 mmol/L (ref 3.5–5.1)
Sodium: 139 mmol/L (ref 135–145)

## 2021-01-18 LAB — CBC
HCT: 42.8 % (ref 36.0–46.0)
Hemoglobin: 13.8 g/dL (ref 12.0–15.0)
MCH: 29.1 pg (ref 26.0–34.0)
MCHC: 32.2 g/dL (ref 30.0–36.0)
MCV: 90.3 fL (ref 80.0–100.0)
Platelets: 193 10*3/uL (ref 150–400)
RBC: 4.74 MIL/uL (ref 3.87–5.11)
RDW: 15.1 % (ref 11.5–15.5)
WBC: 17.9 10*3/uL — ABNORMAL HIGH (ref 4.0–10.5)
nRBC: 0 % (ref 0.0–0.2)

## 2021-01-18 MED ORDER — AMLODIPINE BESYLATE 5 MG PO TABS
2.5000 mg | ORAL_TABLET | Freq: Every day | ORAL | Status: DC
  Administered 2021-01-18 – 2021-01-23 (×6): 2.5 mg via ORAL
  Filled 2021-01-18 (×7): qty 1

## 2021-01-18 MED ORDER — LEVETIRACETAM IN NACL 500 MG/100ML IV SOLN
500.0000 mg | INTRAVENOUS | Status: DC
  Administered 2021-01-19 – 2021-01-20 (×2): 500 mg via INTRAVENOUS
  Filled 2021-01-18 (×4): qty 100

## 2021-01-18 MED ORDER — CHLORHEXIDINE GLUCONATE CLOTH 2 % EX PADS
6.0000 | MEDICATED_PAD | Freq: Every day | CUTANEOUS | Status: DC
  Administered 2021-01-19 – 2021-01-24 (×6): 6 via TOPICAL

## 2021-01-18 NOTE — ED Notes (Signed)
Admitting MD at bedside.

## 2021-01-18 NOTE — Progress Notes (Signed)
Recurrent  PROGRESS NOTE  [name]    DOB: [birth year], 87 y.o.  XBM:[id number]  PCP: Celene Squibb, MD   Code Status: DNR   DOA: 01/17/2021   LOS: 0  Brief Narrative of Current Hospitalization  [name] [name] is a 87 y.o. female with a PMH significant for CKD 4, history of bladder cancer, HTN, seizure disorder, chronic back pain, UTIs, chronic Foley, wheelchair-bound at baseline. They presented from SNF to the ED on 01/17/2021 with right facial pain x 2 days. In the ED, it was found that they had parotitis. They were treated with IV fluids and antibiotics.  Patient was admitted to medicine service for further workup and management of parotiditis as outlined in detail below.  01/18/21 -stable  Assessment & Plan  Principal Problem:   Acute parotitis Active Problems:   Seizure disorder ([place])   Benign hypertension   AKI (acute kidney injury) ([place])   CKD (chronic kidney disease), stage IV ([facility])   Bladder cancer ([facility])   Recurrent UTI  Parotitis-started on meropenem in ED due to poor clinical picture and multiple allergies.  Patient is clinically much improved today without respiratory or oropharynx compression.  MRSA PCR there is negative -Repeat meropenem dose tonight which is 24-hour coverage based on her renal dosing. -If patient continues to do well tomorrow, pharmacy recommends Rocephin plus Flagyl -Continue IV fluid hydration -BMP and CBC a.m. -Monitor fever curve -Strict I's/O -Analgesia as needed -SLP evaluation to see if patient can tolerate her p.o. medications  AKI CKD 4-has chronic indwelling catheter but is asymptomatic for UTI (scheduled outpatient appointment for foley exchange on 10/18).  Renal ultrasound consistent with right nephrectomy, left renal cortical thinning and increased cortical echotexture consistent with medical renal disease.  Likely in setting of dehydration and acute illness.  Creatinine improving today 3.92> 3.29.  Urine culture pending. -Continue IV  hydration -Nephrology following, appreciate recommendations -Renal dose medications  Seizure disorder- chronic, stable -Continue home Keppra  HTN-elevated in 150s over 60s.  Home meds have been held due to believing patient could not tolerate p.o. medications -Reordering home amlodipine, -Discontinuing rosuvastatin as patient is 87 years old and the benefits of statin medications is null  Bladder cancer-stable.  Follow-up outpatient with palliative.  Depression-patient takes sertraline.  Patient also on several medications that can cause CNS depression including gabapentin, diphenhydramine, clonazepam, oxybutynin, tizanidine-we will discontinue those at this time -Continue home medication  DVT prophylaxis: heparin injection 5,000 Units Start: 01/17/21 2200  Diet:  Diet Orders (From admission, onward)     Start     Ordered   01/17/21 2052  Diet NPO time specified Except for: Sips with Meds, Ice Chips, Other (See Comments)  Diet effective now       Comments: Sips of water if able to take  Question Answer Comment  Except for Sips with Meds   Except for Ice Chips   Except for Other (See Comments)      01/17/21 2051            Subjective 01/18/21    Pt reports improved pain. Denies difficulty with breathing or sensation of difficulty swallowing but had not eaten yet this morning.   Disposition Plan & Communication  Status is: Inpatient  The patient will require care spanning > 2 midnights and should be moved to inpatient because: IV antibiotic treatment required   Family Communication: Sister on phone  Consults, Procedures, Significant Events  Consultants:  None  Procedures/significant events:  None  Antimicrobials:  Anti-infectives (From admission, onward)    Start     Dose/Rate Route Frequency Ordered Stop   01/17/21 2015  meropenem (MERREM) 1 g in sodium chloride 0.9 % 100 mL IVPB        1 g 200 mL/hr over 30 Minutes Intravenous Every 24 hours 01/17/21 2014      01/17/21 1945  cefTRIAXone (ROCEPHIN) 1 g in sodium chloride 0.9 % 100 mL IVPB  Status:  Discontinued        1 g 200 mL/hr over 30 Minutes Intravenous Every 24 hours 01/17/21 1941 01/17/21 2005   01/17/21 1945  metroNIDAZOLE (FLAGYL) IVPB 500 mg  Status:  Discontinued        500 mg 100 mL/hr over 60 Minutes Intravenous Every 12 hours 01/17/21 1941 01/17/21 2005        Objective   Vitals:   01/18/21 1046 01/18/21 1200 01/18/21 1230 01/18/21 1300  BP: (!) 174/60 (!) 147/45 (!) 151/62 (!) 127/96  Pulse: 70 68    Resp: 14 19 13 19   Temp:      TempSrc:      SpO2: 100% 100%    Weight:      Height:        Intake/Output Summary (Last 24 hours) at 01/18/2021 1343 Last data filed at 01/18/2021 0650 Gross per 24 hour  Intake 1136.65 ml  Output 1000 ml  Net 136.65 ml    Filed Weights   01/17/21 1311  Weight: 75.3 kg    Patient BMI: Body mass index is 30.36 kg/m.   Physical Exam: General: awake, alert, NAD HEENT: atraumatic, clear conjunctiva, anicteric sclera, dry mucus membranes, hearing grossly normal.  Positive for large firm swelling on right side of face from anterior ear to mid neck.  Patient Dors is only mild tenderness to palpation. Respiratory: CTAB, no wheezes, rales or rhonchi, normal respiratory effort. Cardiovascular: quick capillary refill  Nervous: A&O x3. no gross focal neurologic deficits, normal speech Extremities: moves all equally, nonpitting edema, normal tone Skin: dry, intact, normal temperature, normal color, No rashes, lesions or ulcers Psychiatry: normal mood, congruent affect  Labs   I have personally reviewed following labs and imaging studies Admission on 01/17/2021  Component Date Value Ref Range Status   WBC 01/17/2021 9.5  4.0 - 10.5 K/uL Final   RBC 01/17/2021 4.37  3.87 - 5.11 MIL/uL Final   Hemoglobin 01/17/2021 12.8  12.0 - 15.0 g/dL Final   HCT 01/17/2021 39.8  36.0 - 46.0 % Final   MCV 01/17/2021 91.1  80.0 - 100.0 fL Final   MCH  01/17/2021 29.3  26.0 - 34.0 pg Final   MCHC 01/17/2021 32.2  30.0 - 36.0 g/dL Final   RDW 01/17/2021 15.1  11.5 - 15.5 % Final   Platelets 01/17/2021 202  150 - 400 K/uL Final   nRBC 01/17/2021 0.0  0.0 - 0.2 % Final   Neutrophils Relative % 01/17/2021 65  % Final   Neutro Abs 01/17/2021 6.1  1.7 - 7.7 K/uL Final   Lymphocytes Relative 01/17/2021 24  % Final   Lymphs Abs 01/17/2021 2.3  0.7 - 4.0 K/uL Final   Monocytes Relative 01/17/2021 8  % Final   Monocytes Absolute 01/17/2021 0.8  0.1 - 1.0 K/uL Final   Eosinophils Relative 01/17/2021 3  % Final   Eosinophils Absolute 01/17/2021 0.2  0.0 - 0.5 K/uL Final   Basophils Relative 01/17/2021 0  % Final   Basophils Absolute 01/17/2021 0.0  0.0 -  0.1 K/uL Final   Immature Granulocytes 01/17/2021 0  % Final   Abs Immature Granulocytes 01/17/2021 0.04  0.00 - 0.07 K/uL Final   Sodium 01/17/2021 138  135 - 145 mmol/L Final   Potassium 01/17/2021 3.7  3.5 - 5.1 mmol/L Final   Chloride 01/17/2021 108  98 - 111 mmol/L Final   CO2 01/17/2021 18 (A) 22 - 32 mmol/L Final   Glucose, Bld 01/17/2021 95  70 - 99 mg/dL Final   BUN 01/17/2021 36 (A) 8 - 23 mg/dL Final   Creatinine, Ser 01/17/2021 3.92 (A) 0.44 - 1.00 mg/dL Final   Calcium 01/17/2021 9.0  8.9 - 10.3 mg/dL Final   Total Protein 01/17/2021 7.0  6.5 - 8.1 g/dL Final   Albumin 01/17/2021 3.4 (A) 3.5 - 5.0 g/dL Final   AST 01/17/2021 25  15 - 41 U/L Final   ALT 01/17/2021 11  0 - 44 U/L Final   Alkaline Phosphatase 01/17/2021 72  38 - 126 U/L Final   Total Bilirubin 01/17/2021 0.7  0.3 - 1.2 mg/dL Final   GFR, Estimated 01/17/2021 10 (A) >60 mL/min Final   Anion gap 01/17/2021 12  5 - 15 Final   Lipase 01/17/2021 39  11 - 51 U/L Final   Color, Urine 01/17/2021 YELLOW  YELLOW Final   APPearance 01/17/2021 TURBID (A) CLEAR Final   Specific Gravity, Urine 01/17/2021 1.009  1.005 - 1.030 Final   pH 01/17/2021 5.0  5.0 - 8.0 Final   Glucose, UA 01/17/2021 NEGATIVE  NEGATIVE mg/dL Final    Hgb urine dipstick 01/17/2021 MODERATE (A) NEGATIVE Final   Bilirubin Urine 01/17/2021 NEGATIVE  NEGATIVE Final   Ketones, ur 01/17/2021 5 (A) NEGATIVE mg/dL Final   Protein, ur 01/17/2021 30 (A) NEGATIVE mg/dL Final   Nitrite 01/17/2021 POSITIVE (A) NEGATIVE Final   Leukocytes,Ua 01/17/2021 LARGE (A) NEGATIVE Final   RBC / HPF 01/17/2021 21-50  0 - 5 RBC/hpf Final   WBC, UA 01/17/2021 >50 (A) 0 - 5 WBC/hpf Final   Bacteria, UA 01/17/2021 MANY (A) NONE SEEN Final   WBC Clumps 01/17/2021 PRESENT   Final   Mucus 01/17/2021 PRESENT   Final   Non Squamous Epithelial 01/17/2021 0-5 (A) NONE SEEN Final   Troponin I (High Sensitivity) 01/17/2021 12  <18 ng/L Final   Lactic Acid, Venous 01/17/2021 1.1  0.5 - 1.9 mmol/L Final   Lactic Acid, Venous 01/17/2021 1.1  0.5 - 1.9 mmol/L Final   SARS Coronavirus 2 by RT PCR 01/17/2021 NEGATIVE  NEGATIVE Final   Influenza A by PCR 01/17/2021 NEGATIVE  NEGATIVE Final   Influenza B by PCR 01/17/2021 NEGATIVE  NEGATIVE Final   Troponin I (High Sensitivity) 01/17/2021 16  <18 ng/L Final   Sodium 01/18/2021 139  135 - 145 mmol/L Final   Potassium 01/18/2021 4.1  3.5 - 5.1 mmol/L Final   Chloride 01/18/2021 110  98 - 111 mmol/L Final   CO2 01/18/2021 13 (A) 22 - 32 mmol/L Final   Glucose, Bld 01/18/2021 133 (A) 70 - 99 mg/dL Final   BUN 01/18/2021 32 (A) 8 - 23 mg/dL Final   Creatinine, Ser 01/18/2021 3.29 (A) 0.44 - 1.00 mg/dL Final   Calcium 01/18/2021 8.9  8.9 - 10.3 mg/dL Final   GFR, Estimated 01/18/2021 13 (A) >60 mL/min Final   Anion gap 01/18/2021 16 (A) 5 - 15 Final   WBC 01/18/2021 17.9 (A) 4.0 - 10.5 K/uL Final   RBC 01/18/2021 4.74  3.87 - 5.11  MIL/uL Final   Hemoglobin 01/18/2021 13.8  12.0 - 15.0 g/dL Final   HCT 01/18/2021 42.8  36.0 - 46.0 % Final   MCV 01/18/2021 90.3  80.0 - 100.0 fL Final   MCH 01/18/2021 29.1  26.0 - 34.0 pg Final   MCHC 01/18/2021 32.2  30.0 - 36.0 g/dL Final   RDW 01/18/2021 15.1  11.5 - 15.5 % Final   Platelets  01/18/2021 193  150 - 400 K/uL Final   nRBC 01/18/2021 0.0  0.0 - 0.2 % Final   MRSA by PCR Next Gen 01/17/2021 NOT DETECTED  NOT DETECTED Final    Imaging Studies  CT HEAD WO CONTRAST (5MM)  Result Date: 01/17/2021 CLINICAL DATA:  Nystagmus or other irregular eye movement right sided pain and weakness; maxillofacial pain eval parotid/submandibular swelling EXAM: CT HEAD WITHOUT CONTRAST CT MAXILLOFACIAL WITHOUT CONTRAST TECHNIQUE: Multidetector CT imaging of the head and maxillofacial structures were performed using the standard protocol without intravenous contrast. Multiplanar CT image reconstructions of the maxillofacial structures were also generated. COMPARISON:  06/06/2020 CT head FINDINGS: CT HEAD FINDINGS Brain: There is no acute intracranial hemorrhage, mass effect, or edema. Gray-white differentiation is preserved. The extra-axial collection along the left cerebral convexity has resolved. Falcine subdural hematoma is also expectedly resolved. Prominence of the ventricles and sulci reflects stable parenchymal volume loss. Patchy low-attenuation in the supratentorial white matter probably reflects stable chronic microvascular ischemic changes. Small chronic infarcts of the right corona radiata and basal ganglia as seen previously. Vascular: There is intracranial atherosclerotic calcification at the skull base. Skull: Unremarkable apart from right parietal craniotomy. Other: Mastoid air cells are clear. CT MAXILLOFACIAL FINDINGS Osseous: No acute abnormality. Orbits: Bilateral lens replacements.  Otherwise unremarkable. Sinuses: No significant opacification. Soft tissues: There is infiltration of the fat surrounding the right parotid. The inferior extent is not imaged. Right parotid is enlarged with increased density. No stones within the gland or duct. IMPRESSION: No acute intracranial abnormality. Subdural collections have resolved. Stable chronic/nonemergent findings detailed above. Findings most  consistent with right parotitis. Electronically Signed   By: Macy Mis M.D.   On: 01/17/2021 16:43   CT Cervical Spine Wo Contrast  Result Date: 01/17/2021 CLINICAL DATA:  Cervical radiculopathy, infection suspected EXAM: CT CERVICAL SPINE WITHOUT CONTRAST TECHNIQUE: Multidetector CT imaging of the cervical spine was performed without intravenous contrast. Multiplanar CT image reconstructions were also generated. COMPARISON:  June 05, 2020 FINDINGS: Alignment: Stable. Skull base and vertebrae: Stable vertebral body heights. Partial fusion across the C2-C3 disc space is again noted. As before, there is fusion of the C2-C3 facets. There is no destructive osseous lesion. Soft tissues and spinal canal: No prevertebral fluid or swelling. No visible canal hematoma. Disc levels: Multilevel degenerative changes are present including disc space narrowing, endplate osteophytes, and facet and uncovertebral hypertrophy. These findings are not substantially changed from prior study. There is no high-grade osseous encroachment on the spinal canal. Foraminal narrowing is greatest on the left at C3-C4 and bilaterally at C5-C6. Upper chest: No apical lung mass. Other: Right periparotid inflammatory changes extend to the level of the hyoid. IMPRESSION: No acute abnormality or significant change since prior examination. Multilevel degenerative changes are stable. Electronically Signed   By: Macy Mis M.D.   On: 01/17/2021 16:50   US RENAL  Result Date: 01/17/2021 CLINICAL DATA:  Acute renal insufficiency EXAM: RENAL / URINARY TRACT ULTRASOUND COMPLETE COMPARISON:  01/17/2021 FINDINGS: Right Kidney: Surgically absent. Left Kidney: Renal measurements: 8.3 x 4.8 x 4.6  cm = volume: 96 mL. Renal cortical thinning, with increased renal cortical echotexture consistent with medical renal disease. No hydronephrosis or nephrolithiasis. Bladder: Foley catheter decompresses the bladder.  Evaluation is limited. Other: None.  IMPRESSION: 1. Status post right nephrectomy. 2. Left renal cortical thinning and increased cortical echotexture consistent with medical renal disease. Electronically Signed   By: Randa Ngo M.D.   On: 01/17/2021 21:34   CT CHEST ABDOMEN PELVIS WO CONTRAST  Result Date: 01/17/2021 CLINICAL DATA:  Nonlocalized acute abdominal pain. EXAM: CT CHEST, ABDOMEN AND PELVIS WITHOUT CONTRAST TECHNIQUE: Multidetector CT imaging of the chest, abdomen and pelvis was performed following the standard protocol without IV contrast. COMPARISON:  CT abdomen pelvis 04/25/2020 FINDINGS: CT CHEST FINDINGS Cardiovascular: Normal heart size. No significant pericardial effusion. The thoracic aorta is normal in caliber. Moderate to severe atherosclerotic plaque of the thoracic aorta. Four-vessel coronary artery calcifications status post coronary artery bypass graft. Mediastinum/Nodes: No gross hilar adenopathy, noting limited sensitivity for the detection of hilar adenopathy on this noncontrast study. No enlarged mediastinal or axillary lymph nodes. Thyroid gland, trachea, and esophagus demonstrate no significant findings. Lungs/Pleura: Scattered punctate calcifications within the lungs likely sequelae of prior granulomatous disease. No pulmonary mass. Focal consolidation. Trace left pleural effusion. Associated left lower lobe passive atelectasis. No right pleural effusion. No pneumothorax. Musculoskeletal: No chest wall abnormality. No suspicious lytic or blastic osseous lesions. No acute displaced fracture. Sternotomy wires appear intact. Severe degenerative changes of the left shoulder. CT ABDOMEN PELVIS FINDINGS Hepatobiliary: No focal liver abnormality. Nonspecific slightly hydropic gallbladder. No gallstones, gallbladder wall thickening, or pericholecystic fluid. No biliary dilatation. Pancreas: No focal lesion. Normal pancreatic contour. No surrounding inflammatory changes. No main pancreatic ductal dilatation. Spleen:  Normal in size without focal abnormality. Adrenals/Urinary Tract: No adrenal nodule bilaterally. Status post right nephrectomy. Left renal cortical scarring. No nephrolithiasis and no hydronephrosis. Urothelial thickening of the left proximal ureter. No ureterolithiasis or hydroureter. Urinary bladder wall thickening and perivesicular fat stranding. Foley catheter terminates within the urinary bladder lumen. Stomach/Bowel: Stomach is within normal limits. Question incidentally noted partial small bowel malrotation. No evidence of bowel wall thickening or dilatation. Colonic diverticulosis. The appendix not definitely identified. Vascular/Lymphatic: No abdominal aorta or iliac aneurysm. Severe atherosclerotic plaque of the aorta and its branches. No abdominal, pelvic, or inguinal lymphadenopathy. Reproductive: Status post hysterectomy. No adnexal masses. Other: No intraperitoneal free fluid. No intraperitoneal free gas. No organized fluid collection. Musculoskeletal: No abdominal wall hernia or abnormality. No suspicious lytic or blastic osseous lesions. No acute displaced fracture. Severe degenerative changes of the lumbar spine. Kyphoplasty of the L1 level. Dextroscoliosis of the spine centered at the L1-L2 level. IMPRESSION: 1. Please note limited evaluation on this noncontrast study. 2. Trace left pleural effusion. 3. Urothelial thickening of the left proximal ureter as well as urinary bladder wall thickening and perivesicular fat stranding consistent with infection. Correlate with urinalysis. 4. Scattered colonic diverticulosis with no acute diverticulitis. 5.  Aortic Atherosclerosis (ICD10-I70.0). 6. Status post right nephrectomy. Electronically Signed   By: Iven Finn M.D.   On: 01/17/2021 16:44   CT Maxillofacial Wo Contrast  Result Date: 01/17/2021 CLINICAL DATA:  Nystagmus or other irregular eye movement right sided pain and weakness; maxillofacial pain eval parotid/submandibular swelling EXAM: CT  HEAD WITHOUT CONTRAST CT MAXILLOFACIAL WITHOUT CONTRAST TECHNIQUE: Multidetector CT imaging of the head and maxillofacial structures were performed using the standard protocol without intravenous contrast. Multiplanar CT image reconstructions of the maxillofacial structures were also generated. COMPARISON:  06/06/2020 CT head FINDINGS: CT HEAD FINDINGS Brain: There is no acute intracranial hemorrhage, mass effect, or edema. Gray-white differentiation is preserved. The extra-axial collection along the left cerebral convexity has resolved. Falcine subdural hematoma is also expectedly resolved. Prominence of the ventricles and sulci reflects stable parenchymal volume loss. Patchy low-attenuation in the supratentorial white matter probably reflects stable chronic microvascular ischemic changes. Small chronic infarcts of the right corona radiata and basal ganglia as seen previously. Vascular: There is intracranial atherosclerotic calcification at the skull base. Skull: Unremarkable apart from right parietal craniotomy. Other: Mastoid air cells are clear. CT MAXILLOFACIAL FINDINGS Osseous: No acute abnormality. Orbits: Bilateral lens replacements.  Otherwise unremarkable. Sinuses: No significant opacification. Soft tissues: There is infiltration of the fat surrounding the right parotid. The inferior extent is not imaged. Right parotid is enlarged with increased density. No stones within the gland or duct. IMPRESSION: No acute intracranial abnormality. Subdural collections have resolved. Stable chronic/nonemergent findings detailed above. Findings most consistent with right parotitis. Electronically Signed   By: Macy Mis M.D.   On: 01/17/2021 16:43   Medications   Scheduled Meds:  amLODipine  2.5 mg Oral Daily   heparin  5,000 Units Subcutaneous Q8H     LOS: 0 days   Time spent: >47min  Richarda Osmond, DO [facility] 01/18/2021, 1:43 PM   To contact the [facility] Attending or Consulting provider  for this patient: Check the care team in [facility] for a) attending/consulting [place] provider listed and b) the [facility] team listed Log into [email or web address] and use [place] universal password to access. If you do not have the password, please contact the hospital operator. Locate the [facility] provider you are looking for under [facility] and page to a number that you can be directly reached. If you still have difficulty reaching the provider, please page the [facility] (Director on Call) for the Hospitalists listed on amion for assistance.

## 2021-01-18 NOTE — Progress Notes (Signed)
Recurrent  PROGRESS NOTE  [name] [name]    DOB: [birth year], 87 y.o.  LPF:[id number]  PCP: Celene Squibb, MD   Code Status: DNR   DOA: 01/17/2021   LOS: 0  Brief Narrative of Current Hospitalization  [name] [name] is a 87 y.o. female with a PMH significant for CKD 4, history of bladder cancer, HTN, seizure disorder, chronic back pain, UTIs, chronic Foley, wheelchair-bound at baseline. They presented from SNF to the ED on 01/17/2021 with right facial pain x 2 days. In the ED, it was found that they had parotitis. They were treated with IV fluids and antibiotics.  Patient was admitted to medicine service for further workup and management of parotiditis as outlined in detail below.  01/18/21 -stable  Assessment & Plan  Principal Problem:   Acute parotitis Active Problems:   Seizure disorder ([place])   Benign hypertension   ARF (acute renal failure) (HCC)   CKD (chronic kidney disease), stage IV (HCC)   Bladder cancer (HCC)   Recurrent UTI  Parotitis-started on meropenem in ED due to poor clinical picture and multiple allergies.  Patient is clinically much improved today without respiratory or oropharynx compression.  MRSA PCR there is negative -Repeat meropenem dose tonight which is 24-hour coverage based on her renal dosing. -If patient continues to do well tomorrow, pharmacy recommends Rocephin plus Flagyl -Continue IV fluid hydration -BMP and CBC a.m. -Monitor fever curve -Strict I's/O -Analgesia as needed -SLP evaluation to see if patient can tolerate her p.o. medications  AKI CKD 4-has chronic indwelling catheter but is asymptomatic for UTI.  Renal ultrasound consistent with right nephrectomy, left renal cortical thinning and increased cortical echotexture consistent with medical renal disease.  Likely in setting of dehydration and acute illness.  Creatinine improving today 3.92> 3.29.  Urine culture pending. -Continue IV hydration -Nephrology following, appreciate  recommendations -Renal dose medications  Seizure disorder- chronic, stable -Continue home Keppra  HTN-elevated in 150s over 60s.  Home meds have been held due to believing patient could not tolerate p.o. medications -Reordering home amlodipine, -Discontinuing rosuvastatin as patient is 87 years old and the benefits of statin medications is null  Bladder cancer-stable.  Follow-up outpatient with palliative.  Depression-patient takes sertraline.  Patient also on several medications that can cause CNS depression including gabapentin, diphenhydramine, clonazepam, oxybutynin, tizanidine-we will discontinue those at this time -Continue home medication  DVT prophylaxis: heparin injection 5,000 Units Start: 01/17/21 2200  Diet:  Diet Orders (From admission, onward)     Start     Ordered   01/17/21 2052  Diet NPO time specified Except for: Sips with Meds, Ice Chips, Other (See Comments)  Diet effective now       Comments: Sips of water if able to take  Question Answer Comment  Except for Sips with Meds   Except for Ice Chips   Except for Other (See Comments)      01/17/21 2051            Subjective 01/18/21    Pt reports improved pain. Denies difficulty with breathing or sensation of difficulty swallowing but had not eaten yet this morning.   Disposition Plan & Communication  Status is: Inpatient  The patient will require care spanning > 2 midnights and should be moved to inpatient because: IV antibiotic treatment required   Family Communication: Sister on phone  Consults, Procedures, Significant Events  Consultants:  None  Procedures/significant events:  None  Antimicrobials:  Anti-infectives (From admission, onward)  Start     Dose/Rate Route Frequency Ordered Stop   01/17/21 2015  meropenem (MERREM) 1 g in sodium chloride 0.9 % 100 mL IVPB        1 g 200 mL/hr over 30 Minutes Intravenous Every 24 hours 01/17/21 2014     01/17/21 1945  cefTRIAXone (ROCEPHIN) 1 g  in sodium chloride 0.9 % 100 mL IVPB  Status:  Discontinued        1 g 200 mL/hr over 30 Minutes Intravenous Every 24 hours 01/17/21 1941 01/17/21 2005   01/17/21 1945  metroNIDAZOLE (FLAGYL) IVPB 500 mg  Status:  Discontinued        500 mg 100 mL/hr over 60 Minutes Intravenous Every 12 hours 01/17/21 1941 01/17/21 2005        Objective   Vitals:   01/18/21 0245 01/18/21 0300 01/18/21 0645 01/18/21 0731  BP: (!) 151/58 (!) 151/64 (!) 167/64 (!) 166/56  Pulse: 69 72 71 73  Resp: 15 16 16 15   Temp:    98.4 F (36.9 C)  TempSrc:    Oral  SpO2: 92% 96% 98% 100%  Weight:      Height:        Intake/Output Summary (Last 24 hours) at 01/18/2021 0816 Last data filed at 01/18/2021 0650 Gross per 24 hour  Intake 1136.65 ml  Output 1000 ml  Net 136.65 ml   Filed Weights   01/17/21 1311  Weight: 75.3 kg    Patient BMI: Body mass index is 30.36 kg/m.   Physical Exam: General: awake, alert, NAD HEENT: atraumatic, clear conjunctiva, anicteric sclera, dry mucus membranes, hearing grossly normal.  Positive for large firm swelling on right side of face from anterior ear to mid neck.  Patient Dors is only mild tenderness to palpation. Respiratory: CTAB, no wheezes, rales or rhonchi, normal respiratory effort. Cardiovascular: quick capillary refill  Nervous: A&O x3. no gross focal neurologic deficits, normal speech Extremities: moves all equally, nonpitting edema, normal tone Skin: dry, intact, normal temperature, normal color, No rashes, lesions or ulcers Psychiatry: normal mood, congruent affect  Labs   I have personally reviewed following labs and imaging studies Admission on 01/17/2021  Component Date Value Ref Range Status   WBC 01/17/2021 9.5  4.0 - 10.5 K/uL Final   RBC 01/17/2021 4.37  3.87 - 5.11 MIL/uL Final   Hemoglobin 01/17/2021 12.8  12.0 - 15.0 g/dL Final   HCT 01/17/2021 39.8  36.0 - 46.0 % Final   MCV 01/17/2021 91.1  80.0 - 100.0 fL Final   MCH 01/17/2021 29.3   26.0 - 34.0 pg Final   MCHC 01/17/2021 32.2  30.0 - 36.0 g/dL Final   RDW 01/17/2021 15.1  11.5 - 15.5 % Final   Platelets 01/17/2021 202  150 - 400 K/uL Final   nRBC 01/17/2021 0.0  0.0 - 0.2 % Final   Neutrophils Relative % 01/17/2021 65  % Final   Neutro Abs 01/17/2021 6.1  1.7 - 7.7 K/uL Final   Lymphocytes Relative 01/17/2021 24  % Final   Lymphs Abs 01/17/2021 2.3  0.7 - 4.0 K/uL Final   Monocytes Relative 01/17/2021 8  % Final   Monocytes Absolute 01/17/2021 0.8  0.1 - 1.0 K/uL Final   Eosinophils Relative 01/17/2021 3  % Final   Eosinophils Absolute 01/17/2021 0.2  0.0 - 0.5 K/uL Final   Basophils Relative 01/17/2021 0  % Final   Basophils Absolute 01/17/2021 0.0  0.0 - 0.1 K/uL Final   Immature  Granulocytes 01/17/2021 0  % Final   Abs Immature Granulocytes 01/17/2021 0.04  0.00 - 0.07 K/uL Final   Sodium 01/17/2021 138  135 - 145 mmol/L Final   Potassium 01/17/2021 3.7  3.5 - 5.1 mmol/L Final   Chloride 01/17/2021 108  98 - 111 mmol/L Final   CO2 01/17/2021 18 (A) 22 - 32 mmol/L Final   Glucose, Bld 01/17/2021 95  70 - 99 mg/dL Final   BUN 01/17/2021 36 (A) 8 - 23 mg/dL Final   Creatinine, Ser 01/17/2021 3.92 (A) 0.44 - 1.00 mg/dL Final   Calcium 01/17/2021 9.0  8.9 - 10.3 mg/dL Final   Total Protein 01/17/2021 7.0  6.5 - 8.1 g/dL Final   Albumin 01/17/2021 3.4 (A) 3.5 - 5.0 g/dL Final   AST 01/17/2021 25  15 - 41 U/L Final   ALT 01/17/2021 11  0 - 44 U/L Final   Alkaline Phosphatase 01/17/2021 72  38 - 126 U/L Final   Total Bilirubin 01/17/2021 0.7  0.3 - 1.2 mg/dL Final   GFR, Estimated 01/17/2021 10 (A) >60 mL/min Final   Anion gap 01/17/2021 12  5 - 15 Final   Lipase 01/17/2021 39  11 - 51 U/L Final   Color, Urine 01/17/2021 YELLOW  YELLOW Final   APPearance 01/17/2021 TURBID (A) CLEAR Final   Specific Gravity, Urine 01/17/2021 1.009  1.005 - 1.030 Final   pH 01/17/2021 5.0  5.0 - 8.0 Final   Glucose, UA 01/17/2021 NEGATIVE  NEGATIVE mg/dL Final   Hgb urine  dipstick 01/17/2021 MODERATE (A) NEGATIVE Final   Bilirubin Urine 01/17/2021 NEGATIVE  NEGATIVE Final   Ketones, ur 01/17/2021 5 (A) NEGATIVE mg/dL Final   Protein, ur 01/17/2021 30 (A) NEGATIVE mg/dL Final   Nitrite 01/17/2021 POSITIVE (A) NEGATIVE Final   Leukocytes,Ua 01/17/2021 LARGE (A) NEGATIVE Final   RBC / HPF 01/17/2021 21-50  0 - 5 RBC/hpf Final   WBC, UA 01/17/2021 >50 (A) 0 - 5 WBC/hpf Final   Bacteria, UA 01/17/2021 MANY (A) NONE SEEN Final   WBC Clumps 01/17/2021 PRESENT   Final   Mucus 01/17/2021 PRESENT   Final   Non Squamous Epithelial 01/17/2021 0-5 (A) NONE SEEN Final   Troponin I (High Sensitivity) 01/17/2021 12  <18 ng/L Final   Lactic Acid, Venous 01/17/2021 1.1  0.5 - 1.9 mmol/L Final   Lactic Acid, Venous 01/17/2021 1.1  0.5 - 1.9 mmol/L Final   SARS Coronavirus 2 by RT PCR 01/17/2021 NEGATIVE  NEGATIVE Final   Influenza A by PCR 01/17/2021 NEGATIVE  NEGATIVE Final   Influenza B by PCR 01/17/2021 NEGATIVE  NEGATIVE Final   Troponin I (High Sensitivity) 01/17/2021 16  <18 ng/L Final   Sodium 01/18/2021 139  135 - 145 mmol/L Final   Potassium 01/18/2021 4.1  3.5 - 5.1 mmol/L Final   Chloride 01/18/2021 110  98 - 111 mmol/L Final   CO2 01/18/2021 13 (A) 22 - 32 mmol/L Final   Glucose, Bld 01/18/2021 133 (A) 70 - 99 mg/dL Final   BUN 01/18/2021 32 (A) 8 - 23 mg/dL Final   Creatinine, Ser 01/18/2021 3.29 (A) 0.44 - 1.00 mg/dL Final   Calcium 01/18/2021 8.9  8.9 - 10.3 mg/dL Final   GFR, Estimated 01/18/2021 13 (A) >60 mL/min Final   Anion gap 01/18/2021 16 (A) 5 - 15 Final   WBC 01/18/2021 17.9 (A) 4.0 - 10.5 K/uL Final   RBC 01/18/2021 4.74  3.87 - 5.11 MIL/uL Final   Hemoglobin 01/18/2021  13.8  12.0 - 15.0 g/dL Final   HCT 01/18/2021 42.8  36.0 - 46.0 % Final   MCV 01/18/2021 90.3  80.0 - 100.0 fL Final   MCH 01/18/2021 29.1  26.0 - 34.0 pg Final   MCHC 01/18/2021 32.2  30.0 - 36.0 g/dL Final   RDW 01/18/2021 15.1  11.5 - 15.5 % Final   Platelets 01/18/2021  193  150 - 400 K/uL Final   nRBC 01/18/2021 0.0  0.0 - 0.2 % Final   MRSA by PCR Next Gen 01/17/2021 NOT DETECTED  NOT DETECTED Final    Imaging Studies  CT HEAD WO CONTRAST (5MM)  Result Date: 01/17/2021 CLINICAL DATA:  Nystagmus or other irregular eye movement right sided pain and weakness; maxillofacial pain eval parotid/submandibular swelling EXAM: CT HEAD WITHOUT CONTRAST CT MAXILLOFACIAL WITHOUT CONTRAST TECHNIQUE: Multidetector CT imaging of the head and maxillofacial structures were performed using the standard protocol without intravenous contrast. Multiplanar CT image reconstructions of the maxillofacial structures were also generated. COMPARISON:  06/06/2020 CT head FINDINGS: CT HEAD FINDINGS Brain: There is no acute intracranial hemorrhage, mass effect, or edema. Gray-white differentiation is preserved. The extra-axial collection along the left cerebral convexity has resolved. Falcine subdural hematoma is also expectedly resolved. Prominence of the ventricles and sulci reflects stable parenchymal volume loss. Patchy low-attenuation in the supratentorial white matter probably reflects stable chronic microvascular ischemic changes. Small chronic infarcts of the right corona radiata and basal ganglia as seen previously. Vascular: There is intracranial atherosclerotic calcification at the skull base. Skull: Unremarkable apart from right parietal craniotomy. Other: Mastoid air cells are clear. CT MAXILLOFACIAL FINDINGS Osseous: No acute abnormality. Orbits: Bilateral lens replacements.  Otherwise unremarkable. Sinuses: No significant opacification. Soft tissues: There is infiltration of the fat surrounding the right parotid. The inferior extent is not imaged. Right parotid is enlarged with increased density. No stones within the gland or duct. IMPRESSION: No acute intracranial abnormality. Subdural collections have resolved. Stable chronic/nonemergent findings detailed above. Findings most consistent  with right parotitis. Electronically Signed   By: Macy Mis M.D.   On: 01/17/2021 16:43   CT Cervical Spine Wo Contrast  Result Date: 01/17/2021 CLINICAL DATA:  Cervical radiculopathy, infection suspected EXAM: CT CERVICAL SPINE WITHOUT CONTRAST TECHNIQUE: Multidetector CT imaging of the cervical spine was performed without intravenous contrast. Multiplanar CT image reconstructions were also generated. COMPARISON:  June 05, 2020 FINDINGS: Alignment: Stable. Skull base and vertebrae: Stable vertebral body heights. Partial fusion across the C2-C3 disc space is again noted. As before, there is fusion of the C2-C3 facets. There is no destructive osseous lesion. Soft tissues and spinal canal: No prevertebral fluid or swelling. No visible canal hematoma. Disc levels: Multilevel degenerative changes are present including disc space narrowing, endplate osteophytes, and facet and uncovertebral hypertrophy. These findings are not substantially changed from prior study. There is no high-grade osseous encroachment on the spinal canal. Foraminal narrowing is greatest on the left at C3-C4 and bilaterally at C5-C6. Upper chest: No apical lung mass. Other: Right periparotid inflammatory changes extend to the level of the hyoid. IMPRESSION: No acute abnormality or significant change since prior examination. Multilevel degenerative changes are stable. Electronically Signed   By: Macy Mis M.D.   On: 01/17/2021 16:50   US RENAL  Result Date: 01/17/2021 CLINICAL DATA:  Acute renal insufficiency EXAM: RENAL / URINARY TRACT ULTRASOUND COMPLETE COMPARISON:  01/17/2021 FINDINGS: Right Kidney: Surgically absent. Left Kidney: Renal measurements: 8.3 x 4.8 x 4.6 cm = volume: 96 mL. Renal  cortical thinning, with increased renal cortical echotexture consistent with medical renal disease. No hydronephrosis or nephrolithiasis. Bladder: Foley catheter decompresses the bladder.  Evaluation is limited. Other: None. IMPRESSION: 1.  Status post right nephrectomy. 2. Left renal cortical thinning and increased cortical echotexture consistent with medical renal disease. Electronically Signed   By: Randa Ngo M.D.   On: 01/17/2021 21:34   CT CHEST ABDOMEN PELVIS WO CONTRAST  Result Date: 01/17/2021 CLINICAL DATA:  Nonlocalized acute abdominal pain. EXAM: CT CHEST, ABDOMEN AND PELVIS WITHOUT CONTRAST TECHNIQUE: Multidetector CT imaging of the chest, abdomen and pelvis was performed following the standard protocol without IV contrast. COMPARISON:  CT abdomen pelvis 04/25/2020 FINDINGS: CT CHEST FINDINGS Cardiovascular: Normal heart size. No significant pericardial effusion. The thoracic aorta is normal in caliber. Moderate to severe atherosclerotic plaque of the thoracic aorta. Four-vessel coronary artery calcifications status post coronary artery bypass graft. Mediastinum/Nodes: No gross hilar adenopathy, noting limited sensitivity for the detection of hilar adenopathy on this noncontrast study. No enlarged mediastinal or axillary lymph nodes. Thyroid gland, trachea, and esophagus demonstrate no significant findings. Lungs/Pleura: Scattered punctate calcifications within the lungs likely sequelae of prior granulomatous disease. No pulmonary mass. Focal consolidation. Trace left pleural effusion. Associated left lower lobe passive atelectasis. No right pleural effusion. No pneumothorax. Musculoskeletal: No chest wall abnormality. No suspicious lytic or blastic osseous lesions. No acute displaced fracture. Sternotomy wires appear intact. Severe degenerative changes of the left shoulder. CT ABDOMEN PELVIS FINDINGS Hepatobiliary: No focal liver abnormality. Nonspecific slightly hydropic gallbladder. No gallstones, gallbladder wall thickening, or pericholecystic fluid. No biliary dilatation. Pancreas: No focal lesion. Normal pancreatic contour. No surrounding inflammatory changes. No main pancreatic ductal dilatation. Spleen: Normal in size  without focal abnormality. Adrenals/Urinary Tract: No adrenal nodule bilaterally. Status post right nephrectomy. Left renal cortical scarring. No nephrolithiasis and no hydronephrosis. Urothelial thickening of the left proximal ureter. No ureterolithiasis or hydroureter. Urinary bladder wall thickening and perivesicular fat stranding. Foley catheter terminates within the urinary bladder lumen. Stomach/Bowel: Stomach is within normal limits. Question incidentally noted partial small bowel malrotation. No evidence of bowel wall thickening or dilatation. Colonic diverticulosis. The appendix not definitely identified. Vascular/Lymphatic: No abdominal aorta or iliac aneurysm. Severe atherosclerotic plaque of the aorta and its branches. No abdominal, pelvic, or inguinal lymphadenopathy. Reproductive: Status post hysterectomy. No adnexal masses. Other: No intraperitoneal free fluid. No intraperitoneal free gas. No organized fluid collection. Musculoskeletal: No abdominal wall hernia or abnormality. No suspicious lytic or blastic osseous lesions. No acute displaced fracture. Severe degenerative changes of the lumbar spine. Kyphoplasty of the L1 level. Dextroscoliosis of the spine centered at the L1-L2 level. IMPRESSION: 1. Please note limited evaluation on this noncontrast study. 2. Trace left pleural effusion. 3. Urothelial thickening of the left proximal ureter as well as urinary bladder wall thickening and perivesicular fat stranding consistent with infection. Correlate with urinalysis. 4. Scattered colonic diverticulosis with no acute diverticulitis. 5.  Aortic Atherosclerosis (ICD10-I70.0). 6. Status post right nephrectomy. Electronically Signed   By: Iven Finn M.D.   On: 01/17/2021 16:44   CT Maxillofacial Wo Contrast  Result Date: 01/17/2021 CLINICAL DATA:  Nystagmus or other irregular eye movement right sided pain and weakness; maxillofacial pain eval parotid/submandibular swelling EXAM: CT HEAD WITHOUT  CONTRAST CT MAXILLOFACIAL WITHOUT CONTRAST TECHNIQUE: Multidetector CT imaging of the head and maxillofacial structures were performed using the standard protocol without intravenous contrast. Multiplanar CT image reconstructions of the maxillofacial structures were also generated. COMPARISON:  06/06/2020 CT head FINDINGS: CT HEAD  FINDINGS Brain: There is no acute intracranial hemorrhage, mass effect, or edema. Gray-white differentiation is preserved. The extra-axial collection along the left cerebral convexity has resolved. Falcine subdural hematoma is also expectedly resolved. Prominence of the ventricles and sulci reflects stable parenchymal volume loss. Patchy low-attenuation in the supratentorial white matter probably reflects stable chronic microvascular ischemic changes. Small chronic infarcts of the right corona radiata and basal ganglia as seen previously. Vascular: There is intracranial atherosclerotic calcification at the skull base. Skull: Unremarkable apart from right parietal craniotomy. Other: Mastoid air cells are clear. CT MAXILLOFACIAL FINDINGS Osseous: No acute abnormality. Orbits: Bilateral lens replacements.  Otherwise unremarkable. Sinuses: No significant opacification. Soft tissues: There is infiltration of the fat surrounding the right parotid. The inferior extent is not imaged. Right parotid is enlarged with increased density. No stones within the gland or duct. IMPRESSION: No acute intracranial abnormality. Subdural collections have resolved. Stable chronic/nonemergent findings detailed above. Findings most consistent with right parotitis. Electronically Signed   By: Macy Mis M.D.   On: 01/17/2021 16:43   Medications   Scheduled Meds:  heparin  5,000 Units Subcutaneous Q8H     LOS: 0 days   Time spent: >60min  Shine Mikes L [name] Peretz, DO [facility] 01/18/2021, 8:16 AM   To contact the [facility] Attending or Consulting provider for this patient: Check the care team in [facility]  for a) attending/consulting [place] provider listed and b) the [facility] team listed Log into [email or web address] and use [facility] [place]'s universal password to access. If you do not have the password, please contact the hospital operator. Locate the [facility] provider you are looking for under [facility] and page to a number that you can be directly reached. If you still have difficulty reaching the provider, please page the [facility] (Director on Call) for the Hospitalists listed on amion for assistance.

## 2021-01-18 NOTE — Progress Notes (Addendum)
NEW ADMISSION NOTE New Admission Note:   Arrival Method: stretcher Mental Orientation: A&O X4 Telemetry: none Assessment: Completed Skin: intact, deep tissue injury L medial foot, abrasion on L top of left ankle, lump(lymphoma) medial chest IV: LFA Pain: 0/10 Tubes:foley Safety Measures: Safety Fall Prevention Plan has been given, discussed and signed Admission: Completed [facility] Orientation: Patient has been orientated to the room, unit and staff.  Family: none at bedside  Orders have been reviewed and implemented. Will continue to monitor the patient. Call light has been placed within reach and bed alarm has been activated.   Rafaelita Foister S Daris Harkins, RN

## 2021-01-18 NOTE — Progress Notes (Signed)
SLP Cancellation Note  Patient Details Name: [name] [name] MRN: [id number] DOB: 02-Apr-1929   Cancelled treatment:       Reason Eval/Treat Not Completed: SLP screened, no needs identified, will sign off (Speech/language cognition evaluation was ordered for this pt. This was discussed with Dr. Ouida Sills who reported that this was likely done when a stroke was suspected, but this has since been ruled out. SLP will therefore only follow for swallowing at this time.)  [name] I. Hardin Negus, [place], [place] Office number [phone] Pager [place] 01/18/2021, 5:04 PM

## 2021-01-18 NOTE — Evaluation (Signed)
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 female who presented to the ED on 01/17/2021 with right facial pain x 2 days and was found to have parotitis. Pt treated with IV fluids and antibiotics. Pt made NPO and SLP consulted to assess pt's ability to tolerate p.o. meds. CT maxxilofacial: There is infiltration of the fat surrounding the right  parotid. The inferior extent is not imaged. Right parotid is  enlarged with increased density. PMH: CKD 4, history of bladder cancer, HTN, seizure disorder, chronic back pain, UTIs, chronic Foley, wheelchair-bound at baseline.    Assessment / Plan / Recommendation  Clinical Impression  Pt was seen for bedside swallow evaluation. She reported that she has most recently been eating mashed potatoes and finely chopped meats. She stated that she coughs occasionally if she drinks too much too quickly. Oral mechanism exam was limited due to pt's difficulty following commands; however, oral motor strength and ROM appeared grossly WFL. Pt was edentulous; maxillary dentures were  found in the room, but pt requested that they not be placed so they were returned to her belongings bag. She demonstrated coughing once following four consecutive swallows of thin liquids via straw, but she tolerated all other solids and liquids without s/sx of aspiration. Mastication was [facility] for dysphagia 2 solids. Bolus manipulation was prolonged with regular textures and pt ultimately removed the bolus and stated that she did not want it. Oral clearance was [facility] across consistencies. A dysphagia 2 diet with thin liquids is recommended at this time. SLP will follow pt. SLP Visit Diagnosis: Dysphagia, unspecified (R13.10)    Aspiration Risk  Mild aspiration risk    Diet Recommendation Dysphagia 2 (Fine chop);Thin liquid   Liquid Administration via: Cup;Straw Medication Administration: Whole meds with liquid Supervision: Staff to assist with self feeding Compensations: Slow rate;Small sips/bites; avoid consecutive swallows Postural Changes: Seated upright at 90 degrees    Other  Recommendations Oral Care Recommendations: Oral care BID    Recommendations for follow up therapy are one component of a multi-disciplinary discharge planning process, led by the attending physician.  Recommendations may be updated based on patient status, additional functional criteria and insurance authorization.  Follow up Recommendations None      Frequency and Duration min 2x/week  2 weeks       Prognosis Prognosis for Safe Diet Advancement: Good Barriers to Reach Goals: Time post onset  Swallow Study   General Date of Onset: 01/17/21 HPI: Pt is a 87 y.o. female who presented to the ED on 01/17/2021 with right facial pain x 2 days and was found to have parotitis. Pt treated with IV fluids and antibiotics. Pt made NPO and SLP consulted to assess pt's ability to tolerate p.o. meds. CT maxxilofacial: There is infiltration of the fat surrounding the right  parotid. The inferior extent is not imaged.  Right parotid is  enlarged with increased density. PMH: CKD 4, history of bladder cancer, HTN, seizure disorder, chronic back pain, UTIs, chronic Foley, wheelchair-bound at baseline. Type of Study: Bedside Swallow Evaluation Previous Swallow Assessment: none Diet Prior to this Study: NPO Temperature Spikes Noted: No Respiratory Status: Room air History of Recent Intubation: No Behavior/Cognition: Alert;Cooperative;Pleasant mood Oral Cavity Assessment: Dry Oral Care Completed by SLP: No Oral Cavity - Dentition: Edentulous Vision: Functional for self-feeding Self-Feeding Abilities: Needs assist Patient Positioning: Upright in bed Baseline Vocal Quality: Normal Volitional Cough: Strong Volitional Swallow: Able to elicit    Oral/Motor/Sensory Function Overall Oral Motor/Sensory Function: Within functional limits (limited assessment)   Ice Chips Ice chips: Within functional limits Presentation: Spoon   Thin Liquid Thin Liquid: Within functional limits Presentation: Straw;Cup;Self Fed    Nectar Thick Nectar Thick Liquid: Not tested   Honey Thick Honey Thick Liquid: Not tested   Puree Puree: Within functional limits Presentation: Spoon   Solid           Ynez Eugenio I. Hardin Negus, [place], [place] Office number [phone] Pager [place] 01/18/2021,4:57 PM

## 2021-01-18 NOTE — ED Notes (Signed)
[name] [name] [phone] requesting an update

## 2021-01-19 DIAGNOSIS — K1121 Acute sialoadenitis: Secondary | ICD-10-CM | POA: Diagnosis not present

## 2021-01-19 LAB — COMPREHENSIVE METABOLIC PANEL
ALT: 12 U/L (ref 0–44)
AST: 29 U/L (ref 15–41)
Albumin: 2.5 g/dL — ABNORMAL LOW (ref 3.5–5.0)
Alkaline Phosphatase: 66 U/L (ref 38–126)
Anion gap: 12 (ref 5–15)
BUN: 35 mg/dL — ABNORMAL HIGH (ref 8–23)
CO2: 18 mmol/L — ABNORMAL LOW (ref 22–32)
Calcium: 8.7 mg/dL — ABNORMAL LOW (ref 8.9–10.3)
Chloride: 110 mmol/L (ref 98–111)
Creatinine, Ser: 2.89 mg/dL — ABNORMAL HIGH (ref 0.44–1.00)
GFR, Estimated: 15 mL/min — ABNORMAL LOW (ref >60)
Glucose, Bld: 119 mg/dL — ABNORMAL HIGH (ref 70–99)
Potassium: 3.2 mmol/L — ABNORMAL LOW (ref 3.5–5.1)
Sodium: 140 mmol/L (ref 135–145)
Total Bilirubin: 0.8 mg/dL (ref 0.3–1.2)
Total Protein: 5.8 g/dL — ABNORMAL LOW (ref 6.5–8.1)

## 2021-01-19 LAB — CBC
HCT: 33.5 % — ABNORMAL LOW (ref 36.0–46.0)
Hemoglobin: 11.5 g/dL — ABNORMAL LOW (ref 12.0–15.0)
MCH: 29.5 pg (ref 26.0–34.0)
MCHC: 34.3 g/dL (ref 30.0–36.0)
MCV: 85.9 fL (ref 80.0–100.0)
Platelets: 186 10*3/uL (ref 150–400)
RBC: 3.9 MIL/uL (ref 3.87–5.11)
RDW: 15.4 % (ref 11.5–15.5)
WBC: 20.3 10*3/uL — ABNORMAL HIGH (ref 4.0–10.5)
nRBC: 0 % (ref 0.0–0.2)

## 2021-01-19 MED ORDER — SODIUM BICARBONATE 650 MG PO TABS
650.0000 mg | ORAL_TABLET | Freq: Three times a day (TID) | ORAL | Status: AC
  Administered 2021-01-19 – 2021-01-21 (×9): 650 mg via ORAL
  Filled 2021-01-19 (×9): qty 1

## 2021-01-19 MED ORDER — HYDRALAZINE HCL 20 MG/ML IJ SOLN: 10.0000 mg | Freq: Four times a day (QID) | INTRAMUSCULAR | Status: DC | PRN

## 2021-01-19 NOTE — Plan of Care (Signed)
  Problem: Clinical Measurements: °Goal: Respiratory complications will improve °Outcome: Progressing °  °Problem: Nutrition: °Goal: Adequate nutrition will be maintained °Outcome: Progressing °  °Problem: Activity: °Goal: Risk for activity intolerance will decrease °Outcome: Not Progressing °  °

## 2021-01-19 NOTE — Progress Notes (Addendum)
PROGRESS NOTE    [name] [name]  WUJ:[id number] DOB: 08/29/1928 DOA: 01/17/2021 PCP: Celene Squibb, MD    Brief Narrative:  This 87 years old female with PMH significant for CKD stage IV, history of bladder cancer, hypertension, seizure disorder, chronic back pain, UTIs, chronic Foley catheter, wheelchair-bound at baseline presented from the nursing home with complaints of right facial pain for 2 days.  In the ED it was found out that she has parotitis.  Patient was treated with IV fluids and antibiotics and admitted for further work-up and management.  Assessment & Plan:   Principal Problem:   Acute parotitis Active Problems:   Seizure disorder ([place])   Benign hypertension   AKI (acute kidney injury) ([place])   CKD (chronic kidney disease), stage IV ([facility])   Bladder cancer ([facility])   Recurrent UTI  Parotitis / Right mandibular swelling: Patient presented with right parotid gland swelling with erythema. Patient  started on meropenem due to poor clinical picture and multiple allergies. Patient is clinically stable without respiratory or oropharynx compression. Will continue meropenem for today,  will change to Rocephin plus Flagyl tomorrow as per pharmacy Continue IV fluids. Continue adequate pain control. Speech and swallow evaluation to see if patient can tolerate her p.o. meds.  AKI on CKD stage IV: Patient does have chronic indwelling Foley catheter but is asymptomatic for UTI. Patient has appointment for Foley exchange on 10/18. Renal ultrasound consistent with right nephrectomy, left renal cortical thinning and increased cortical echotexture consistent with medical renal disease. Creatinine improving 3.92> 3.29 >2.89 Follow-up urine culture Nephrology is following,  appreciate recommendation  Seizure disorder: Continue home Keppra  Essential hypertension: Continue amlodipine  Bladder cancer: Follow-up outpatient with palliative care  Depression: Patient takes Zoloft,   Patient also on several medications that can cause CNS depression including gabapentin, diphenhydramine,  clonazepam, oxybutynin, tizanidine.  will discontinue all at this point.   DVT prophylaxis: Heparin subcu Code Status: DNR Family Communication: No family at bedside Disposition Plan:    Status is: Inpatient  Remains inpatient appropriate because: Further work-up of parotitis Anticipated discharge home in 1 to 2 days    Consultants:  None  Procedures: CT face Antimicrobials:   Anti-infectives (From admission, onward)    Start     Dose/Rate Route Frequency Ordered Stop   01/17/21 2015  meropenem (MERREM) 1 g in sodium chloride 0.9 % 100 mL IVPB        1 g 200 mL/hr over 30 Minutes Intravenous Every 24 hours 01/17/21 2014     01/17/21 1945  cefTRIAXone (ROCEPHIN) 1 g in sodium chloride 0.9 % 100 mL IVPB  Status:  Discontinued        1 g 200 mL/hr over 30 Minutes Intravenous Every 24 hours 01/17/21 1941 01/17/21 2005   01/17/21 1945  metroNIDAZOLE (FLAGYL) IVPB 500 mg  Status:  Discontinued        500 mg 100 mL/hr over 60 Minutes Intravenous Every 12 hours 01/17/21 1941 01/17/21 2005       Subjective: Patient was seen and examined at bedside.  Overnight events noted.   Patient is alert and oriented X 2 .  Patient has visible right submandibular swelling,  tender, warm and swollen.  Objective: Vitals:   01/18/21 1726 01/18/21 2118 01/19/21 0310 01/19/21 1010  BP: (!) 166/59 (!) 175/55 (!) 170/65 (!) 164/65  Pulse: 69 70 72 71  Resp: 16 19 18 18   Temp: 98.5 F (36.9 C) 98.9 F (37.2 C) 98.2  F (36.8 C) 98.5 F (36.9 C)  TempSrc:  Oral Oral Oral  SpO2: 100% 98% 99% 97%  Weight:      Height:        Intake/Output Summary (Last 24 hours) at 01/19/2021 1353 Last data filed at 01/19/2021 0600 Gross per 24 hour  Intake 1437.2 ml  Output 1 ml  Net 1436.2 ml   Filed Weights   01/17/21 1311 01/18/21 1548  Weight: 75.3 kg 68.4 kg    Examination:  General  exam: Appears comfortable, not in any acute distress.   Face:  Right submandibular swelling, tender, warm . Respiratory system: Clear to auscultation bilaterally, RR 15.   Cardiovascular system: S1-S2 heard, regular rate and rhythm, no murmur.   Gastrointestinal system: Abdomen soft, nontender, nondistended, BS+ Central nervous system: Alert and oriented X 2. No focal neurological deficits. Extremities: No edema, no cyanosis, no clubbing. Skin: No rashes, lesions or ulcers Psychiatry: Judgement and insight appear normal. Mood & affect appropriate.     Data Reviewed: I have personally reviewed following labs and imaging studies  CBC: Recent Labs  Lab 01/17/21 1539 01/18/21 0500 01/19/21 0656  WBC 9.5 17.9* 20.3*  NEUTROABS 6.1  --   --   HGB 12.8 13.8 11.5*  HCT 39.8 42.8 33.5*  MCV 91.1 90.3 85.9  PLT 202 193 409   Basic Metabolic Panel: Recent Labs  Lab 01/17/21 1539 01/18/21 0500 01/19/21 0656  NA 138 139 140  K 3.7 4.1 3.2*  CL 108 110 110  CO2 18* 13* 18*  GLUCOSE 95 133* 119*  BUN 36* 32* 35*  CREATININE 3.92* 3.29* 2.89*  CALCIUM 9.0 8.9 8.7*   GFR: Estimated Creatinine Clearance: 11.7 mL/min (A) (by C-G formula based on SCr of 2.89 mg/dL (H)). Liver Function Tests: Recent Labs  Lab 01/17/21 1539 01/19/21 0656  AST 25 29  ALT 11 12  ALKPHOS 72 66  BILITOT 0.7 0.8  PROT 7.0 5.8*  ALBUMIN 3.4* 2.5*   Recent Labs  Lab 01/17/21 1539  LIPASE 39   No results for input(s): AMMONIA in the last 168 hours. Coagulation Profile: No results for input(s): INR, PROTIME in the last 168 hours. Cardiac Enzymes: No results for input(s): CKTOTAL, CKMB, CKMBINDEX, TROPONINI in the last 168 hours. BNP (last 3 results) No results for input(s): PROBNP in the last 8760 hours. HbA1C: No results for input(s): HGBA1C in the last 72 hours. CBG: No results for input(s): GLUCAP in the last 168 hours. Lipid Profile: No results for input(s): CHOL, HDL, LDLCALC, TRIG,  CHOLHDL, LDLDIRECT in the last 72 hours. Thyroid Function Tests: No results for input(s): TSH, T4TOTAL, FREET4, T3FREE, THYROIDAB in the last 72 hours. Anemia Panel: No results for input(s): VITAMINB12, FOLATE, FERRITIN, TIBC, IRON, RETICCTPCT in the last 72 hours. Sepsis Labs: Recent Labs  Lab 01/17/21 1456 01/17/21 1749  LATICACIDVEN 1.1 1.1    Recent Results (from the past 240 hour(s))  Resp Panel by RT-PCR (Flu A&B, Covid) Nasopharyngeal Swab     Status: None   Collection Time: 01/17/21  3:18 PM   Specimen: Nasopharyngeal Swab; Nasopharyngeal(NP) swabs in vial transport medium  Result Value Ref Range Status   SARS Coronavirus 2 by RT PCR NEGATIVE NEGATIVE Final    Comment: (NOTE) SARS-CoV-2 target nucleic acids are NOT DETECTED.  The SARS-CoV-2 RNA is generally detectable in upper respiratory specimens during the acute phase of infection. The lowest concentration of SARS-CoV-2 viral copies this assay can detect is 138 copies/mL. A negative result  does not preclude SARS-Cov-2 infection and should not be used as the sole basis for treatment or other patient management decisions. A negative result may occur with  improper specimen collection/handling, submission of specimen other than nasopharyngeal swab, presence of viral mutation(s) within the areas targeted by this assay, and inadequate number of viral copies(<138 copies/mL). A negative result must be combined with clinical observations, patient history, and epidemiological information. The expected result is Negative.  Fact Sheet for Patients:  EntrepreneurPulse.com.au  Fact Sheet for Healthcare Providers:  IncredibleEmployment.be  This test is no t yet approved or cleared by the [place] FDA and  has been authorized for detection and/or diagnosis of SARS-CoV-2 by FDA under an Emergency Use Authorization (EUA). This EUA will remain  in effect (meaning this test can be used) for  the duration of the COVID-19 declaration under Section 564(b)(1) of the Act, 21 U.S.C.section 360bbb-3(b)(1), unless the authorization is terminated  or revoked sooner.       Influenza A by PCR NEGATIVE NEGATIVE Final   Influenza B by PCR NEGATIVE NEGATIVE Final    Comment: (NOTE) The Xpert Xpress SARS-CoV-2/FLU/RSV plus assay is intended as an aid in the diagnosis of influenza from Nasopharyngeal swab specimens and should not be used as a sole basis for treatment. Nasal washings and aspirates are unacceptable for Xpert Xpress SARS-CoV-2/FLU/RSV testing.  Fact Sheet for Patients: EntrepreneurPulse.com.au  Fact Sheet for Healthcare Providers: IncredibleEmployment.be  This test is not yet approved or cleared by the [place] FDA and has been authorized for detection and/or diagnosis of SARS-CoV-2 by FDA under an Emergency Use Authorization (EUA). This EUA will remain in effect (meaning this test can be used) for the duration of the COVID-19 declaration under Section 564(b)(1) of the Act, 21 U.S.C. section 360bbb-3(b)(1), unless the authorization is terminated or revoked.  Performed at [facility], [place] [address] [place], [place] [address]   Urine Culture     Status: Abnormal (Preliminary result)   Collection Time: 01/17/21  3:36 PM   Specimen: Urine, Clean Catch  Result Value Ref Range Status   Specimen Description URINE, CLEAN CATCH  Final   Special Requests NONE  Final   Culture (A)  Final    >=100,000 COLONIES/mL GRAM NEGATIVE RODS SUSCEPTIBILITIES TO FOLLOW Performed at [facility], [place] [address], [place], [place] [address]    Report Status PENDING  Incomplete  MRSA Next Gen by PCR, Nasal     Status: None   Collection Time: 01/17/21  9:47 PM   Specimen: Nasal Mucosa; Nasal Swab  Result Value Ref Range Status   MRSA by PCR Next Gen NOT DETECTED NOT DETECTED Final    Comment: (NOTE) The GeneXpert MRSA Assay (FDA  approved for NASAL specimens only), is one component of a comprehensive MRSA colonization surveillance program. It is not intended to diagnose MRSA infection nor to guide or monitor treatment for MRSA infections. Test performance is not FDA approved in patients less than 40 years old. Performed at [facility], [place] [address] [place], [place] [address]          Radiology Studies: CT HEAD WO CONTRAST (5MM)  Result Date: 01/17/2021 CLINICAL DATA:  Nystagmus or other irregular eye movement right sided pain and weakness; maxillofacial pain eval parotid/submandibular swelling EXAM: CT HEAD WITHOUT CONTRAST CT MAXILLOFACIAL WITHOUT CONTRAST TECHNIQUE: Multidetector CT imaging of the head and maxillofacial structures were performed using the standard protocol without intravenous contrast. Multiplanar CT image reconstructions of  the maxillofacial structures were also generated. COMPARISON:  06/06/2020 CT head FINDINGS: CT HEAD FINDINGS Brain: There is no acute intracranial hemorrhage, mass effect, or edema. Gray-white differentiation is preserved. The extra-axial collection along the left cerebral convexity has resolved. Falcine subdural hematoma is also expectedly resolved. Prominence of the ventricles and sulci reflects stable parenchymal volume loss. Patchy low-attenuation in the supratentorial white matter probably reflects stable chronic microvascular ischemic changes. Small chronic infarcts of the right corona radiata and basal ganglia as seen previously. Vascular: There is intracranial atherosclerotic calcification at the skull base. Skull: Unremarkable apart from right parietal craniotomy. Other: Mastoid air cells are clear. CT MAXILLOFACIAL FINDINGS Osseous: No acute abnormality. Orbits: Bilateral lens replacements.  Otherwise unremarkable. Sinuses: No significant opacification. Soft tissues: There is infiltration of the fat surrounding the right parotid. The inferior extent is not imaged.  Right parotid is enlarged with increased density. No stones within the gland or duct. IMPRESSION: No acute intracranial abnormality. Subdural collections have resolved. Stable chronic/nonemergent findings detailed above. Findings most consistent with right parotitis. Electronically Signed   By: Macy Mis M.D.   On: 01/17/2021 16:43   CT Cervical Spine Wo Contrast  Result Date: 01/17/2021 CLINICAL DATA:  Cervical radiculopathy, infection suspected EXAM: CT CERVICAL SPINE WITHOUT CONTRAST TECHNIQUE: Multidetector CT imaging of the cervical spine was performed without intravenous contrast. Multiplanar CT image reconstructions were also generated. COMPARISON:  June 05, 2020 FINDINGS: Alignment: Stable. Skull base and vertebrae: Stable vertebral body heights. Partial fusion across the C2-C3 disc space is again noted. As before, there is fusion of the C2-C3 facets. There is no destructive osseous lesion. Soft tissues and spinal canal: No prevertebral fluid or swelling. No visible canal hematoma. Disc levels: Multilevel degenerative changes are present including disc space narrowing, endplate osteophytes, and facet and uncovertebral hypertrophy. These findings are not substantially changed from prior study. There is no high-grade osseous encroachment on the spinal canal. Foraminal narrowing is greatest on the left at C3-C4 and bilaterally at C5-C6. Upper chest: No apical lung mass. Other: Right periparotid inflammatory changes extend to the level of the hyoid. IMPRESSION: No acute abnormality or significant change since prior examination. Multilevel degenerative changes are stable. Electronically Signed   By: Macy Mis M.D.   On: 01/17/2021 16:50   US RENAL  Result Date: 01/17/2021 CLINICAL DATA:  Acute renal insufficiency EXAM: RENAL / URINARY TRACT ULTRASOUND COMPLETE COMPARISON:  01/17/2021 FINDINGS: Right Kidney: Surgically absent. Left Kidney: Renal measurements: 8.3 x 4.8 x 4.6 cm = volume: 96 mL.  Renal cortical thinning, with increased renal cortical echotexture consistent with medical renal disease. No hydronephrosis or nephrolithiasis. Bladder: Foley catheter decompresses the bladder.  Evaluation is limited. Other: None. IMPRESSION: 1. Status post right nephrectomy. 2. Left renal cortical thinning and increased cortical echotexture consistent with medical renal disease. Electronically Signed   By: Randa Ngo M.D.   On: 01/17/2021 21:34   CT CHEST ABDOMEN PELVIS WO CONTRAST  Result Date: 01/17/2021 CLINICAL DATA:  Nonlocalized acute abdominal pain. EXAM: CT CHEST, ABDOMEN AND PELVIS WITHOUT CONTRAST TECHNIQUE: Multidetector CT imaging of the chest, abdomen and pelvis was performed following the standard protocol without IV contrast. COMPARISON:  CT abdomen pelvis 04/25/2020 FINDINGS: CT CHEST FINDINGS Cardiovascular: Normal heart size. No significant pericardial effusion. The thoracic aorta is normal in caliber. Moderate to severe atherosclerotic plaque of the thoracic aorta. Four-vessel coronary artery calcifications status post coronary artery bypass graft. Mediastinum/Nodes: No gross hilar adenopathy, noting limited sensitivity for the detection of hilar  adenopathy on this noncontrast study. No enlarged mediastinal or axillary lymph nodes. Thyroid gland, trachea, and esophagus demonstrate no significant findings. Lungs/Pleura: Scattered punctate calcifications within the lungs likely sequelae of prior granulomatous disease. No pulmonary mass. Focal consolidation. Trace left pleural effusion. Associated left lower lobe passive atelectasis. No right pleural effusion. No pneumothorax. Musculoskeletal: No chest wall abnormality. No suspicious lytic or blastic osseous lesions. No acute displaced fracture. Sternotomy wires appear intact. Severe degenerative changes of the left shoulder. CT ABDOMEN PELVIS FINDINGS Hepatobiliary: No focal liver abnormality. Nonspecific slightly hydropic gallbladder. No  gallstones, gallbladder wall thickening, or pericholecystic fluid. No biliary dilatation. Pancreas: No focal lesion. Normal pancreatic contour. No surrounding inflammatory changes. No main pancreatic ductal dilatation. Spleen: Normal in size without focal abnormality. Adrenals/Urinary Tract: No adrenal nodule bilaterally. Status post right nephrectomy. Left renal cortical scarring. No nephrolithiasis and no hydronephrosis. Urothelial thickening of the left proximal ureter. No ureterolithiasis or hydroureter. Urinary bladder wall thickening and perivesicular fat stranding. Foley catheter terminates within the urinary bladder lumen. Stomach/Bowel: Stomach is within normal limits. Question incidentally noted partial small bowel malrotation. No evidence of bowel wall thickening or dilatation. Colonic diverticulosis. The appendix not definitely identified. Vascular/Lymphatic: No abdominal aorta or iliac aneurysm. Severe atherosclerotic plaque of the aorta and its branches. No abdominal, pelvic, or inguinal lymphadenopathy. Reproductive: Status post hysterectomy. No adnexal masses. Other: No intraperitoneal free fluid. No intraperitoneal free gas. No organized fluid collection. Musculoskeletal: No abdominal wall hernia or abnormality. No suspicious lytic or blastic osseous lesions. No acute displaced fracture. Severe degenerative changes of the lumbar spine. Kyphoplasty of the L1 level. Dextroscoliosis of the spine centered at the L1-L2 level. IMPRESSION: 1. Please note limited evaluation on this noncontrast study. 2. Trace left pleural effusion. 3. Urothelial thickening of the left proximal ureter as well as urinary bladder wall thickening and perivesicular fat stranding consistent with infection. Correlate with urinalysis. 4. Scattered colonic diverticulosis with no acute diverticulitis. 5.  Aortic Atherosclerosis (ICD10-I70.0). 6. Status post right nephrectomy. Electronically Signed   By: Iven Finn M.D.   On:  01/17/2021 16:44   CT Maxillofacial Wo Contrast  Result Date: 01/17/2021 CLINICAL DATA:  Nystagmus or other irregular eye movement right sided pain and weakness; maxillofacial pain eval parotid/submandibular swelling EXAM: CT HEAD WITHOUT CONTRAST CT MAXILLOFACIAL WITHOUT CONTRAST TECHNIQUE: Multidetector CT imaging of the head and maxillofacial structures were performed using the standard protocol without intravenous contrast. Multiplanar CT image reconstructions of the maxillofacial structures were also generated. COMPARISON:  06/06/2020 CT head FINDINGS: CT HEAD FINDINGS Brain: There is no acute intracranial hemorrhage, mass effect, or edema. Gray-white differentiation is preserved. The extra-axial collection along the left cerebral convexity has resolved. Falcine subdural hematoma is also expectedly resolved. Prominence of the ventricles and sulci reflects stable parenchymal volume loss. Patchy low-attenuation in the supratentorial white matter probably reflects stable chronic microvascular ischemic changes. Small chronic infarcts of the right corona radiata and basal ganglia as seen previously. Vascular: There is intracranial atherosclerotic calcification at the skull base. Skull: Unremarkable apart from right parietal craniotomy. Other: Mastoid air cells are clear. CT MAXILLOFACIAL FINDINGS Osseous: No acute abnormality. Orbits: Bilateral lens replacements.  Otherwise unremarkable. Sinuses: No significant opacification. Soft tissues: There is infiltration of the fat surrounding the right parotid. The inferior extent is not imaged. Right parotid is enlarged with increased density. No stones within the gland or duct. IMPRESSION: No acute intracranial abnormality. Subdural collections have resolved. Stable chronic/nonemergent findings detailed above. Findings most consistent with right parotitis. Electronically  Signed   By: Macy Mis M.D.   On: 01/17/2021 16:43    Scheduled Meds:  amLODipine  2.5 mg  Oral Daily   Chlorhexidine Gluconate Cloth  6 each Topical Daily   heparin  5,000 Units Subcutaneous Q8H   sodium bicarbonate  650 mg Oral TID   Continuous Infusions:  lactated ringers 100 mL/hr at 01/18/21 2054   levETIRAcetam 500 mg (01/19/21 0146)   meropenem (MERREM) IV 1 g (01/18/21 2335)     LOS: 1 day    Time spent: 35 mins    Aster Eckrich, MD [facility]   If 7PM-7AM, please contact night-coverage

## 2021-01-20 DIAGNOSIS — K1121 Acute sialoadenitis: Secondary | ICD-10-CM | POA: Diagnosis not present

## 2021-01-20 LAB — BASIC METABOLIC PANEL
Anion gap: 10 (ref 5–15)
BUN: 35 mg/dL — ABNORMAL HIGH (ref 8–23)
CO2: 20 mmol/L — ABNORMAL LOW (ref 22–32)
Calcium: 8.6 mg/dL — ABNORMAL LOW (ref 8.9–10.3)
Chloride: 108 mmol/L (ref 98–111)
Creatinine, Ser: 2.57 mg/dL — ABNORMAL HIGH (ref 0.44–1.00)
GFR, Estimated: 17 mL/min — ABNORMAL LOW (ref >60)
Glucose, Bld: 99 mg/dL (ref 70–99)
Potassium: 2.8 mmol/L — ABNORMAL LOW (ref 3.5–5.1)
Sodium: 138 mmol/L (ref 135–145)

## 2021-01-20 LAB — CBC
HCT: 34 % — ABNORMAL LOW (ref 36.0–46.0)
Hemoglobin: 11.6 g/dL — ABNORMAL LOW (ref 12.0–15.0)
MCH: 29.4 pg (ref 26.0–34.0)
MCHC: 34.1 g/dL (ref 30.0–36.0)
MCV: 86.1 fL (ref 80.0–100.0)
Platelets: 196 10*3/uL (ref 150–400)
RBC: 3.95 MIL/uL (ref 3.87–5.11)
RDW: 15.4 % (ref 11.5–15.5)
WBC: 17.4 10*3/uL — ABNORMAL HIGH (ref 4.0–10.5)
nRBC: 0 % (ref 0.0–0.2)

## 2021-01-20 LAB — URINE CULTURE: Culture: >=100000 — AB

## 2021-01-20 LAB — PHOSPHORUS: Phosphorus: 2.2 mg/dL — ABNORMAL LOW (ref 2.5–4.6)

## 2021-01-20 LAB — MAGNESIUM: Magnesium: 1.9 mg/dL (ref 1.7–2.4)

## 2021-01-20 MED ORDER — LEVETIRACETAM 250 MG PO TABS
250.0000 mg | ORAL_TABLET | Freq: Two times a day (BID) | ORAL | Status: DC
  Administered 2021-01-20 – 2021-01-24 (×9): 250 mg via ORAL
  Filled 2021-01-20 (×9): qty 1

## 2021-01-20 MED ORDER — METRONIDAZOLE 500 MG/100ML IV SOLN
500.0000 mg | Freq: Two times a day (BID) | INTRAVENOUS | Status: DC
  Administered 2021-01-20 – 2021-01-24 (×8): 500 mg via INTRAVENOUS
  Filled 2021-01-20 (×8): qty 100

## 2021-01-20 MED ORDER — POTASSIUM PHOSPHATES 15 MMOLE/5ML IV SOLN
30.0000 mmol | Freq: Once | INTRAVENOUS | Status: AC
  Administered 2021-01-20: 30 mmol via INTRAVENOUS
  Filled 2021-01-20: qty 10

## 2021-01-20 MED ORDER — POTASSIUM CHLORIDE 20 MEQ PO PACK
40.0000 meq | PACK | Freq: Once | ORAL | Status: AC
  Administered 2021-01-20: 40 meq via ORAL
  Filled 2021-01-20: qty 2

## 2021-01-20 MED ORDER — SODIUM CHLORIDE 0.9 % IV SOLN
2.0000 g | INTRAVENOUS | Status: DC
  Administered 2021-01-20 – 2021-01-23 (×4): 2 g via INTRAVENOUS
  Filled 2021-01-20 (×4): qty 20

## 2021-01-20 NOTE — Progress Notes (Signed)
PROGRESS NOTE    [name] [name]  VOZ:[id number] DOB: 11-04-1928 DOA: 01/17/2021 PCP: Celene Squibb, MD    Brief Narrative:  This 87 years old female with PMH significant for CKD stage IV, history of bladder cancer, hypertension, seizure disorder, chronic back pain, UTIs, chronic Foley catheter, wheelchair-bound at baseline presented from the nursing home with complaints of right facial pain for 2 days.  In the ED it was found out that she has parotitis.  Patient was treated with IV fluids and antibiotics and admitted for further work-up and management.  Assessment & Plan:   Principal Problem:   Acute parotitis Active Problems:   Seizure disorder ([place])   Benign hypertension   AKI (acute kidney injury) ([place])   CKD (chronic kidney disease), stage IV ([facility])   Bladder cancer ([facility])   Recurrent UTI  Parotitis / Right mandibular swelling: Patient presented with right parotid gland swelling with erythema. Patient  started on meropenem due to poor clinical picture and multiple allergies. Patient is clinically stable without respiratory or oropharynx compression. She was started on meropenem,  will change to Rocephin plus Flagyl today. Continue IV fluids. Continue adequate pain control. Speech and swallow evaluation recommended dysphagia 2 diet.  AKI on CKD stage IV: Patient does have chronic indwelling Foley catheter but is asymptomatic for UTI. Patient has appointment for Foley exchange on 10/18. Renal ultrasound consistent with right nephrectomy, left renal cortical thinning and increased cortical echotexture consistent with medical renal disease. Creatinine improving 3.92> 3.29 >2.89>2.57 Follow-up urine culture Nephrology is following,  appreciate recommendation.  Seizure disorder: Continue home Keppra  Essential hypertension: Continue amlodipine  Bladder cancer: Follow-up outpatient with palliative care  Depression: Patient takes Zoloft,  Patient also on several medications  that can cause CNS depression including gabapentin, diphenhydramine,  clonazepam, oxybutynin, tizanidine.  will discontinue all at this point.  Hypokalemia/hypophosphatemia: Replacement in progress, recheck labs in the morning   DVT prophylaxis: Heparin subcu Code Status: DNR Family Communication: No family at bedside Disposition Plan:    Status is: Inpatient  Remains inpatient appropriate because: Further work-up of parotitis Anticipated discharge home in 1 to 2 days    Consultants:  None  Procedures: CT face Antimicrobials:   Anti-infectives (From admission, onward)    Start     Dose/Rate Route Frequency Ordered Stop   01/17/21 2015  meropenem (MERREM) 1 g in sodium chloride 0.9 % 100 mL IVPB        1 g 200 mL/hr over 30 Minutes Intravenous Every 24 hours 01/17/21 2014     01/17/21 1945  cefTRIAXone (ROCEPHIN) 1 g in sodium chloride 0.9 % 100 mL IVPB  Status:  Discontinued        1 g 200 mL/hr over 30 Minutes Intravenous Every 24 hours 01/17/21 1941 01/17/21 2005   01/17/21 1945  metroNIDAZOLE (FLAGYL) IVPB 500 mg  Status:  Discontinued        500 mg 100 mL/hr over 60 Minutes Intravenous Every 12 hours 01/17/21 1941 01/17/21 2005       Subjective: Patient was seen and examined at bedside.  Overnight events noted.   Patient is alert and oriented x2.  Patient has visible right preauricular swelling, tender , non-erythematous.    Objective: Vitals:   01/19/21 2022 01/20/21 0333 01/20/21 0441 01/20/21 0742  BP: 140/66 (!) 140/46 (!) 154/59 (!) 154/71  Pulse: 66 65 63 62  Resp: 17 18 17 18   Temp: 98 F (36.7 C) 98.9 F (37.2 C) 98.4 F (  36.9 C) 98.5 F (36.9 C)  TempSrc: Oral Oral Oral Oral  SpO2: 100% 96% 92% 93%  Weight:      Height:        Intake/Output Summary (Last 24 hours) at 01/20/2021 1135 Last data filed at 01/20/2021 0747 Gross per 24 hour  Intake 2407.88 ml  Output 1125 ml  Net 1282.88 ml   Filed Weights   01/17/21 1311 01/18/21 1548   Weight: 75.3 kg 68.4 kg    Examination:  General exam: Appears comfortable, not in any acute distress. Face: Right preauricular swelling, tender, normal temperature. Respiratory system: Clear to auscultation bilaterally, RR 15.   Cardiovascular system: S1-S2 heard, regular rate and rhythm, no murmur.   Gastrointestinal system: Abdomen soft, nontender, nondistended, BS+ Central nervous system: Alert and oriented X 2. No focal neurological deficits. Extremities: No edema, no cyanosis, no clubbing. Skin: No rashes, lesions or ulcers Psychiatry: Judgement and insight appear normal. Mood & affect appropriate.     Data Reviewed: I have personally reviewed following labs and imaging studies  CBC: Recent Labs  Lab 01/17/21 1539 01/18/21 0500 01/19/21 0656 01/20/21 0341  WBC 9.5 17.9* 20.3* 17.4*  NEUTROABS 6.1  --   --   --   HGB 12.8 13.8 11.5* 11.6*  HCT 39.8 42.8 33.5* 34.0*  MCV 91.1 90.3 85.9 86.1  PLT 202 193 186 093   Basic Metabolic Panel: Recent Labs  Lab 01/17/21 1539 01/18/21 0500 01/19/21 0656 01/20/21 0341  NA 138 139 140 138  K 3.7 4.1 3.2* 2.8*  CL 108 110 110 108  CO2 18* 13* 18* 20*  GLUCOSE 95 133* 119* 99  BUN 36* 32* 35* 35*  CREATININE 3.92* 3.29* 2.89* 2.57*  CALCIUM 9.0 8.9 8.7* 8.6*  MG  --   --   --  1.9  PHOS  --   --   --  2.2*   GFR: Estimated Creatinine Clearance: 13.2 mL/min (A) (by C-G formula based on SCr of 2.57 mg/dL (H)). Liver Function Tests: Recent Labs  Lab 01/17/21 1539 01/19/21 0656  AST 25 29  ALT 11 12  ALKPHOS 72 66  BILITOT 0.7 0.8  PROT 7.0 5.8*  ALBUMIN 3.4* 2.5*   Recent Labs  Lab 01/17/21 1539  LIPASE 39   No results for input(s): AMMONIA in the last 168 hours. Coagulation Profile: No results for input(s): INR, PROTIME in the last 168 hours. Cardiac Enzymes: No results for input(s): CKTOTAL, CKMB, CKMBINDEX, TROPONINI in the last 168 hours. BNP (last 3 results) No results for input(s): PROBNP in the  last 8760 hours. HbA1C: No results for input(s): HGBA1C in the last 72 hours. CBG: No results for input(s): GLUCAP in the last 168 hours. Lipid Profile: No results for input(s): CHOL, HDL, LDLCALC, TRIG, CHOLHDL, LDLDIRECT in the last 72 hours. Thyroid Function Tests: No results for input(s): TSH, T4TOTAL, FREET4, T3FREE, THYROIDAB in the last 72 hours. Anemia Panel: No results for input(s): VITAMINB12, FOLATE, FERRITIN, TIBC, IRON, RETICCTPCT in the last 72 hours. Sepsis Labs: Recent Labs  Lab 01/17/21 1456 01/17/21 1749  LATICACIDVEN 1.1 1.1    Recent Results (from the past 240 hour(s))  Resp Panel by RT-PCR (Flu A&B, Covid) Nasopharyngeal Swab     Status: None   Collection Time: 01/17/21  3:18 PM   Specimen: Nasopharyngeal Swab; Nasopharyngeal(NP) swabs in vial transport medium  Result Value Ref Range Status   SARS Coronavirus 2 by RT PCR NEGATIVE NEGATIVE Final    Comment: (NOTE) SARS-CoV-2  target nucleic acids are NOT DETECTED.  The SARS-CoV-2 RNA is generally detectable in upper respiratory specimens during the acute phase of infection. The lowest concentration of SARS-CoV-2 viral copies this assay can detect is 138 copies/mL. A negative result does not preclude SARS-Cov-2 infection and should not be used as the sole basis for treatment or other patient management decisions. A negative result may occur with  improper specimen collection/handling, submission of specimen other than nasopharyngeal swab, presence of viral mutation(s) within the areas targeted by this assay, and inadequate number of viral copies(<138 copies/mL). A negative result must be combined with clinical observations, patient history, and epidemiological information. The expected result is Negative.  Fact Sheet for Patients:  EntrepreneurPulse.com.au  Fact Sheet for Healthcare Providers:  IncredibleEmployment.be  This test is no t yet approved or cleared by the  [place] FDA and  has been authorized for detection and/or diagnosis of SARS-CoV-2 by FDA under an Emergency Use Authorization (EUA). This EUA will remain  in effect (meaning this test can be used) for the duration of the COVID-19 declaration under Section 564(b)(1) of the Act, 21 U.S.C.section 360bbb-3(b)(1), unless the authorization is terminated  or revoked sooner.       Influenza A by PCR NEGATIVE NEGATIVE Final   Influenza B by PCR NEGATIVE NEGATIVE Final    Comment: (NOTE) The Xpert Xpress SARS-CoV-2/FLU/RSV plus assay is intended as an aid in the diagnosis of influenza from Nasopharyngeal swab specimens and should not be used as a sole basis for treatment. Nasal washings and aspirates are unacceptable for Xpert Xpress SARS-CoV-2/FLU/RSV testing.  Fact Sheet for Patients: EntrepreneurPulse.com.au  Fact Sheet for Healthcare Providers: IncredibleEmployment.be  This test is not yet approved or cleared by the [place] FDA and has been authorized for detection and/or diagnosis of SARS-CoV-2 by FDA under an Emergency Use Authorization (EUA). This EUA will remain in effect (meaning this test can be used) for the duration of the COVID-19 declaration under Section 564(b)(1) of the Act, 21 U.S.C. section 360bbb-3(b)(1), unless the authorization is terminated or revoked.  Performed at [facility], [place] [address] [place], [place] [address]   Urine Culture     Status: Abnormal   Collection Time: 01/17/21  3:36 PM   Specimen: Urine, Clean Catch  Result Value Ref Range Status   Specimen Description URINE, CLEAN CATCH  Final   Special Requests   Final    NONE Performed at [facility], [place] [address] [place],  [address]    Culture (A)  Final    >=100,000 COLONIES/mL ESCHERICHIA COLI Confirmed Extended Spectrum Beta-Lactamase Producer (ESBL).  In bloodstream infections from ESBL organisms, carbapenems are preferred  over piperacillin/tazobactam. They are shown to have a lower risk of mortality.    Report Status 01/20/2021 FINAL  Final   Organism ID, Bacteria ESCHERICHIA COLI (A)  Final      Susceptibility   Escherichia coli - MIC*    AMPICILLIN >=32 RESISTANT Resistant     CEFAZOLIN >=64 RESISTANT Resistant     CEFEPIME 16 RESISTANT Resistant     CEFTRIAXONE >=64 RESISTANT Resistant     CIPROFLOXACIN >=4 RESISTANT Resistant     GENTAMICIN <=1 SENSITIVE Sensitive     IMIPENEM <=0.25 SENSITIVE Sensitive     NITROFURANTOIN 128 RESISTANT Resistant     TRIMETH/SULFA >=320 RESISTANT Resistant     AMPICILLIN/SULBACTAM 8 SENSITIVE Sensitive     PIP/TAZO <=4 SENSITIVE Sensitive     * >=100,000 COLONIES/mL ESCHERICHIA COLI  MRSA Next Gen by PCR, Nasal     Status: None   Collection Time: 01/17/21  9:47 PM   Specimen: Nasal Mucosa; Nasal Swab  Result Value Ref Range Status   MRSA by PCR Next Gen NOT DETECTED NOT DETECTED Final    Comment: (NOTE) The GeneXpert MRSA Assay (FDA approved for NASAL specimens only), is one component of a comprehensive MRSA colonization surveillance program. It is not intended to diagnose MRSA infection nor to guide or monitor treatment for MRSA infections. Test performance is not FDA approved in patients less than 70 years old. Performed at [facility], [place] [address] [place], [place] [address]     Radiology Studies: No results found.  Scheduled Meds:  amLODipine  2.5 mg Oral Daily   Chlorhexidine Gluconate Cloth  6 each Topical Daily   heparin  5,000 Units Subcutaneous Q8H   levETIRAcetam  250 mg Oral BID   sodium bicarbonate  650 mg Oral TID   Continuous Infusions:  lactated ringers Stopped (01/20/21 0832)   meropenem (MERREM) IV 1 g (01/19/21 2015)   potassium PHOSPHATE IVPB (in mmol) 30 mmol (01/20/21 0832)     LOS: 2 days    Time spent: 25 mins    Jubilee Vivero, MD [facility]   If 7PM-7AM, please contact night-coverage

## 2021-01-21 DIAGNOSIS — K1121 Acute sialoadenitis: Secondary | ICD-10-CM | POA: Diagnosis not present

## 2021-01-21 LAB — CBC
HCT: 31.9 % — ABNORMAL LOW (ref 36.0–46.0)
Hemoglobin: 11 g/dL — ABNORMAL LOW (ref 12.0–15.0)
MCH: 29.3 pg (ref 26.0–34.0)
MCHC: 34.5 g/dL (ref 30.0–36.0)
MCV: 85.1 fL (ref 80.0–100.0)
Platelets: 204 10*3/uL (ref 150–400)
RBC: 3.75 MIL/uL — ABNORMAL LOW (ref 3.87–5.11)
RDW: 15.3 % (ref 11.5–15.5)
WBC: 10.9 10*3/uL — ABNORMAL HIGH (ref 4.0–10.5)
nRBC: 0 % (ref 0.0–0.2)

## 2021-01-21 LAB — BASIC METABOLIC PANEL
Anion gap: 9 (ref 5–15)
BUN: 32 mg/dL — ABNORMAL HIGH (ref 8–23)
CO2: 21 mmol/L — ABNORMAL LOW (ref 22–32)
Calcium: 8 mg/dL — ABNORMAL LOW (ref 8.9–10.3)
Chloride: 105 mmol/L (ref 98–111)
Creatinine, Ser: 2.11 mg/dL — ABNORMAL HIGH (ref 0.44–1.00)
GFR, Estimated: 22 mL/min — ABNORMAL LOW (ref >60)
Glucose, Bld: 104 mg/dL — ABNORMAL HIGH (ref 70–99)
Potassium: 2.9 mmol/L — ABNORMAL LOW (ref 3.5–5.1)
Sodium: 135 mmol/L (ref 135–145)

## 2021-01-21 LAB — MAGNESIUM: Magnesium: 1.8 mg/dL (ref 1.7–2.4)

## 2021-01-21 LAB — PHOSPHORUS: Phosphorus: 3.1 mg/dL (ref 2.5–4.6)

## 2021-01-21 MED ORDER — DEXAMETHASONE 4 MG PO TABS
8.0000 mg | ORAL_TABLET | Freq: Three times a day (TID) | ORAL | Status: AC
  Administered 2021-01-21 – 2021-01-22 (×3): 8 mg via ORAL
  Filled 2021-01-21 (×3): qty 2

## 2021-01-21 MED ORDER — POTASSIUM CHLORIDE 20 MEQ PO PACK
40.0000 meq | PACK | Freq: Once | ORAL | Status: AC
  Administered 2021-01-21: 40 meq via ORAL
  Filled 2021-01-21: qty 2

## 2021-01-21 MED ORDER — POTASSIUM CHLORIDE 10 MEQ/100ML IV SOLN
10.0000 meq | INTRAVENOUS | Status: AC
  Administered 2021-01-21 (×3): 10 meq via INTRAVENOUS
  Filled 2021-01-21 (×3): qty 100

## 2021-01-21 NOTE — Progress Notes (Signed)
PROGRESS NOTE    [name] [name]  FTD:[id number] DOB: 07-23-1928 DOA: 01/17/2021 PCP: Celene Squibb, MD    Brief Narrative:  This 87 years old female with PMH significant for CKD stage IV, history of bladder cancer, hypertension, seizure disorder, chronic back pain, UTIs, chronic Foley catheter, wheelchair-bound at baseline presented from the nursing home with complaints of right facial pain for 2 days.  In the ED it was found out that she has parotitis.  Patient was treated with IV fluids and antibiotics and admitted for further work-up and management.  Assessment & Plan:   Principal Problem:   Acute parotitis Active Problems:   Seizure disorder ([place])   Benign hypertension   AKI (acute kidney injury) ([place])   CKD (chronic kidney disease), stage IV (HCC)   Bladder cancer (HCC)   Recurrent UTI  Parotitis / Right mandibular swelling: Patient presented with right parotid gland swelling with erythema. Patient  started on meropenem due to poor clinical picture and multiple allergies. Patient is clinically stable without respiratory or oropharynx compression. She was started on meropenem, antibiotic changed to Rocephin and Flagyl Continue IV fluids. Continue adequate pain control. Speech and swallow evaluation recommended dysphagia 2 diet.  AKI on CKD stage IV: Patient does have chronic indwelling Foley catheter but is asymptomatic for UTI. Patient has appointment for Foley exchange on 10/18. Renal ultrasound consistent with right nephrectomy, left renal cortical thinning and increased cortical echotexture consistent with medical renal disease. Creatinine improving 3.92> 3.29 >2.89>2.57 >2.11 Urine culture grew ESBL pan resistant, Patient denies any urinary symptoms, this could be colonization Continue ceftriaxone and Flagyl for now for parotitis. Nephrology is following,  appreciate recommendation.  Seizure disorder: Continue home Keppra  Essential hypertension: Continue  amlodipine  Bladder cancer: Follow-up outpatient with palliative care.  Depression: Patient takes Zoloft,  Patient also on several medications that can cause CNS depression including gabapentin, diphenhydramine,  clonazepam, oxybutynin, tizanidine.  will discontinue all at this point.  Hypokalemia: Replacement in progress, recheck labs in the morning  Hypophosphatemia: Resolved  DVT prophylaxis: Heparin subcu Code Status: DNR Family Communication: No family at bedside Disposition Plan:    Status is: Inpatient  Remains inpatient appropriate because: Further work-up of parotitis Anticipated discharge home in 1 to 2 days    Consultants:  None  Procedures: CT face Antimicrobials:   Anti-infectives (From admission, onward)    Start     Dose/Rate Route Frequency Ordered Stop   01/20/21 2000  cefTRIAXone (ROCEPHIN) 2 g in sodium chloride 0.9 % 100 mL IVPB        2 g 200 mL/hr over 30 Minutes Intravenous Every 24 hours 01/20/21 1216     01/20/21 2000  metroNIDAZOLE (FLAGYL) IVPB 500 mg        500 mg 100 mL/hr over 60 Minutes Intravenous Every 12 hours 01/20/21 1216     01/17/21 2015  meropenem (MERREM) 1 g in sodium chloride 0.9 % 100 mL IVPB  Status:  Discontinued        1 g 200 mL/hr over 30 Minutes Intravenous Every 24 hours 01/17/21 2014 01/20/21 1216   01/17/21 1945  cefTRIAXone (ROCEPHIN) 1 g in sodium chloride 0.9 % 100 mL IVPB  Status:  Discontinued        1 g 200 mL/hr over 30 Minutes Intravenous Every 24 hours 01/17/21 1941 01/17/21 2005   01/17/21 1945  metroNIDAZOLE (FLAGYL) IVPB 500 mg  Status:  Discontinued        500 mg 100  mL/hr over 60 Minutes Intravenous Every 12 hours 01/17/21 1941 01/17/21 2005       Subjective: Patient was seen and examined at bedside.  Overnight events noted.   Patient is alert and oriented x3.   Patient has visible right preauricular swelling which is tender and nonerythematous.  Objective: Vitals:   01/21/21 0343 01/21/21  0343 01/21/21 0516 01/21/21 0810  BP:  (!) 163/68  (!) 151/52  Pulse:  67  60  Resp:  18  20  Temp: 97.9 F (36.6 C)   98 F (36.7 C)  TempSrc: Oral   Oral  SpO2:  (!) 82%  94%  Weight:   68.6 kg   Height:        Intake/Output Summary (Last 24 hours) at 01/21/2021 1329 Last data filed at 01/21/2021 0900 Gross per 24 hour  Intake 1985.19 ml  Output 1600 ml  Net 385.19 ml   Filed Weights   01/17/21 1311 01/18/21 1548 01/21/21 0516  Weight: 75.3 kg 68.4 kg 68.6 kg    Examination:  General exam: Appears comfortable, not in any acute distress.  Deconditioned Face: Right preauricular swelling, which is tender, normal temperature, nonerythematous Respiratory system: Clear to auscultation bilaterally, RR 15.   Cardiovascular system: S1-S2 heard, regular rate and rhythm, no murmur.   Gastrointestinal system: Abdomen soft, nontender, nondistended, BS+ Central nervous system: Alert and oriented X 2. No focal neurological deficits. Extremities: No edema, no cyanosis, no clubbing. Skin: No rashes, lesions or ulcers Psychiatry: Judgement and insight appear normal. Mood & affect appropriate.     Data Reviewed: I have personally reviewed following labs and imaging studies  CBC: Recent Labs  Lab 01/17/21 1539 01/18/21 0500 01/19/21 0656 01/20/21 0341 01/21/21 0629  WBC 9.5 17.9* 20.3* 17.4* 10.9*  NEUTROABS 6.1  --   --   --   --   HGB 12.8 13.8 11.5* 11.6* 11.0*  HCT 39.8 42.8 33.5* 34.0* 31.9*  MCV 91.1 90.3 85.9 86.1 85.1  PLT 202 193 186 196 209   Basic Metabolic Panel: Recent Labs  Lab 01/17/21 1539 01/18/21 0500 01/19/21 0656 01/20/21 0341 01/21/21 0629  NA 138 139 140 138 135  K 3.7 4.1 3.2* 2.8* 2.9*  CL 108 110 110 108 105  CO2 18* 13* 18* 20* 21*  GLUCOSE 95 133* 119* 99 104*  BUN 36* 32* 35* 35* 32*  CREATININE 3.92* 3.29* 2.89* 2.57* 2.11*  CALCIUM 9.0 8.9 8.7* 8.6* 8.0*  MG  --   --   --  1.9 1.8  PHOS  --   --   --  2.2* 3.1   GFR: Estimated  Creatinine Clearance: 16.1 mL/min (A) (by C-G formula based on SCr of 2.11 mg/dL (H)). Liver Function Tests: Recent Labs  Lab 01/17/21 1539 01/19/21 0656  AST 25 29  ALT 11 12  ALKPHOS 72 66  BILITOT 0.7 0.8  PROT 7.0 5.8*  ALBUMIN 3.4* 2.5*   Recent Labs  Lab 01/17/21 1539  LIPASE 39   No results for input(s): AMMONIA in the last 168 hours. Coagulation Profile: No results for input(s): INR, PROTIME in the last 168 hours. Cardiac Enzymes: No results for input(s): CKTOTAL, CKMB, CKMBINDEX, TROPONINI in the last 168 hours. BNP (last 3 results) No results for input(s): PROBNP in the last 8760 hours. HbA1C: No results for input(s): HGBA1C in the last 72 hours. CBG: No results for input(s): GLUCAP in the last 168 hours. Lipid Profile: No results for input(s): CHOL, HDL, LDLCALC, TRIG,  CHOLHDL, LDLDIRECT in the last 72 hours. Thyroid Function Tests: No results for input(s): TSH, T4TOTAL, FREET4, T3FREE, THYROIDAB in the last 72 hours. Anemia Panel: No results for input(s): VITAMINB12, FOLATE, FERRITIN, TIBC, IRON, RETICCTPCT in the last 72 hours. Sepsis Labs: Recent Labs  Lab 01/17/21 1456 01/17/21 1749  LATICACIDVEN 1.1 1.1    Recent Results (from the past 240 hour(s))  Resp Panel by RT-PCR (Flu A&B, Covid) Nasopharyngeal Swab     Status: None   Collection Time: 01/17/21  3:18 PM   Specimen: Nasopharyngeal Swab; Nasopharyngeal(NP) swabs in vial transport medium  Result Value Ref Range Status   SARS Coronavirus 2 by RT PCR NEGATIVE NEGATIVE Final    Comment: (NOTE) SARS-CoV-2 target nucleic acids are NOT DETECTED.  The SARS-CoV-2 RNA is generally detectable in upper respiratory specimens during the acute phase of infection. The lowest concentration of SARS-CoV-2 viral copies this assay can detect is 138 copies/mL. A negative result does not preclude SARS-Cov-2 infection and should not be used as the sole basis for treatment or other patient management decisions. A  negative result may occur with  improper specimen collection/handling, submission of specimen other than nasopharyngeal swab, presence of viral mutation(s) within the areas targeted by this assay, and inadequate number of viral copies(<138 copies/mL). A negative result must be combined with clinical observations, patient history, and epidemiological information. The expected result is Negative.  Fact Sheet for Patients:  EntrepreneurPulse.com.au  Fact Sheet for Healthcare Providers:  IncredibleEmployment.be  This test is no t yet approved or cleared by the [place] FDA and  has been authorized for detection and/or diagnosis of SARS-CoV-2 by FDA under an Emergency Use Authorization (EUA). This EUA will remain  in effect (meaning this test can be used) for the duration of the COVID-19 declaration under Section 564(b)(1) of the Act, 21 U.S.C.section 360bbb-3(b)(1), unless the authorization is terminated  or revoked sooner.       Influenza A by PCR NEGATIVE NEGATIVE Final   Influenza B by PCR NEGATIVE NEGATIVE Final    Comment: (NOTE) The Xpert Xpress SARS-CoV-2/FLU/RSV plus assay is intended as an aid in the diagnosis of influenza from Nasopharyngeal swab specimens and should not be used as a sole basis for treatment. Nasal washings and aspirates are unacceptable for Xpert Xpress SARS-CoV-2/FLU/RSV testing.  Fact Sheet for Patients: EntrepreneurPulse.com.au  Fact Sheet for Healthcare Providers: IncredibleEmployment.be  This test is not yet approved or cleared by the [place] FDA and has been authorized for detection and/or diagnosis of SARS-CoV-2 by FDA under an Emergency Use Authorization (EUA). This EUA will remain in effect (meaning this test can be used) for the duration of the COVID-19 declaration under Section 564(b)(1) of the Act, 21 U.S.C. section 360bbb-3(b)(1), unless the authorization  is terminated or revoked.  Performed at [facility], [place] [address] [place], [place] [address]   Urine Culture     Status: Abnormal   Collection Time: 01/17/21  3:36 PM   Specimen: Urine, Clean Catch  Result Value Ref Range Status   Specimen Description URINE, CLEAN CATCH  Final   Special Requests   Final    NONE Performed at [facility], [place] [address] [place], [place] [address]    Culture (A)  Final    >=100,000 COLONIES/mL ESCHERICHIA COLI Confirmed Extended Spectrum Beta-Lactamase Producer (ESBL).  In bloodstream infections from ESBL organisms, carbapenems are preferred over piperacillin/tazobactam. They are shown to have a lower risk of mortality.    Report  Status 01/20/2021 FINAL  Final   Organism ID, Bacteria ESCHERICHIA COLI (A)  Final      Susceptibility   Escherichia coli - MIC*    AMPICILLIN >=32 RESISTANT Resistant     CEFAZOLIN >=64 RESISTANT Resistant     CEFEPIME 16 RESISTANT Resistant     CEFTRIAXONE >=64 RESISTANT Resistant     CIPROFLOXACIN >=4 RESISTANT Resistant     GENTAMICIN <=1 SENSITIVE Sensitive     IMIPENEM <=0.25 SENSITIVE Sensitive     NITROFURANTOIN 128 RESISTANT Resistant     TRIMETH/SULFA >=320 RESISTANT Resistant     AMPICILLIN/SULBACTAM 8 SENSITIVE Sensitive     PIP/TAZO <=4 SENSITIVE Sensitive     * >=100,000 COLONIES/mL ESCHERICHIA COLI  MRSA Next Gen by PCR, Nasal     Status: None   Collection Time: 01/17/21  9:47 PM   Specimen: Nasal Mucosa; Nasal Swab  Result Value Ref Range Status   MRSA by PCR Next Gen NOT DETECTED NOT DETECTED Final    Comment: (NOTE) The GeneXpert MRSA Assay (FDA approved for NASAL specimens only), is one component of a comprehensive MRSA colonization surveillance program. It is not intended to diagnose MRSA infection nor to guide or monitor treatment for MRSA infections. Test performance is not FDA approved in patients less than 11 years old. Performed at [facility], [place] [address]  [place], [place] [address]     Radiology Studies: No results found.  Scheduled Meds:  amLODipine  2.5 mg Oral Daily   Chlorhexidine Gluconate Cloth  6 each Topical Daily   heparin  5,000 Units Subcutaneous Q8H   levETIRAcetam  250 mg Oral BID   sodium bicarbonate  650 mg Oral TID   Continuous Infusions:  cefTRIAXone (ROCEPHIN)  IV 2 g (01/20/21 2058)   lactated ringers 100 mL/hr at 01/20/21 2047   metronidazole 500 mg (01/21/21 0945)     LOS: 3 days    Time spent: 25 mins    Rachelanne Whidby, MD [facility]   If 7PM-7AM, please contact night-coverage

## 2021-01-21 NOTE — TOC Initial Note (Signed)
Transition of Care [facility]) - Initial/Assessment Note    Patient Details  Name: [name] [name] MRN: [id number] Date of Birth: [birth year]  Transition of Care [facility]) CM/SW Contact:    Sable Feil, LCSW Phone Number: 01/21/2021, 5:58 PM  Clinical Narrative:  CSW talked with sisters and patient at bedside regarding her discharge disposition. Ms. [name] was lying in bed with her eyes closed and initially CSW thought that she was asleep, however during the conversation she made a comment and CSW realized that although her eyes were closed, she was awake and listening. CSW informed that patient came from [place] to hospital this visit, however her when she was hospitalized before, she came from [facility] and d/c'd to [place] in March 2022. When asked both sisters responded that their sister will return to [place] when medically ready for discharge.      CSW informed there are now 4 sisters living and the other sister lives in [place], as do the 2 sisters that were in the room and the patient. CSW also informed that patient has 3 son: 1 lives in [place], [place] and the other 2 sons live in [place]. CSW advised that patient can't walk and gets around in a wheelchair and needs assistance getting in and out of the wheelchair. She wears glasses and has a permanent urinary catheter that is changed monthly, usually around the 20th of each month and the appointment for this month will be cancelled due to her being in the hospital. The sisters agreed that Hoyle Sauer will be contacted first regarding discharge ([phone]) and Steward Drone will be contacted if [place] reach Hoyle Sauer [phone]).               Expected Discharge Plan: [place] ([facility] - LTC) Barriers to Discharge: Continued Medical Work up   Patient Goals and CMS Choice Patient states their goals for this hospitalization and ongoing recovery are:: Patient and sisters in agreement with patient returning to  [place] at discharge CMS Medicare.gov Compare Post Acute Care list provided to:: Other (Comment Required) (not needed as patient will return to [facility] when medically stable for discharge) Choice offered to / list presented to : NA  Expected Discharge Plan and Services Expected Discharge Plan: [place] ([place]) In-house Referral: Clinical Social Work     Living arrangements for the past 2 months: [place] ([facility])                                      Prior Living Arrangements/Services Living arrangements for the past 2 months: [place] ([place]) Lives with:: Facility Resident Patient language and need for interpreter reviewed:: No Do you feel safe going back to the place where you live?: Yes      Need for Family Participation in Patient Care: Yes (Comment) Care giver support system in place?: Yes (comment) (At SNF)   Criminal Activity/Legal Involvement Pertinent to Current Situation/Hospitalization: No - Comment as needed  Activities of Daily Living Home Assistive Devices/Equipment: Wheelchair ADL Screening (condition at time of admission) Patient's cognitive ability adequate to safely complete daily activities?: Yes Is the patient deaf or have difficulty hearing?: No Does the patient have difficulty seeing, even when wearing glasses/contacts?: No Does the patient have difficulty concentrating, remembering, or making decisions?: No Patient able to express need for assistance with ADLs?: Yes Does the patient have difficulty dressing or  bathing?: Yes Independently performs ADLs?: No Communication: Independent Dressing (OT): Needs assistance Is this a change from baseline?: Pre-admission baseline Grooming: Needs assistance Is this a change from baseline?: Pre-admission baseline Feeding: Needs assistance Is this a change from baseline?: Pre-admission baseline Bathing: Needs assistance Is this a  change from baseline?: Pre-admission baseline Toileting: Needs assistance Is this a change from baseline?: Pre-admission baseline In/Out Bed: Needs assistance Is this a change from baseline?: Pre-admission baseline Walks in Home: Dependent Is this a change from baseline?: Pre-admission baseline Does the patient have difficulty walking or climbing stairs?: Yes Weakness of Legs: Both Weakness of Arms/Hands: None  Permission Sought/Granted Permission sought to share information with : Family Supports Permission granted to share information with : Yes, Verbal Permission Granted  Share Information with NAME: Lavella Hammock and [place] granted to share info w Relationship: Sisters  Permission granted to share info w Contact Information: Ms. Lavella Hammock - [phone] and Molli Hazard - [phone]  Emotional Assessment Appearance:: Appears younger than stated age Attitude/Demeanor/Rapport: Other (comment) (Patient was quiet for most of the visit, but responded when spoken to) Affect (typically observed): Appropriate Orientation: : Oriented to Self, Oriented to Place, Oriented to  Time Alcohol / Substance Use: Not Applicable Psych Involvement: No (comment)  Admission diagnosis:  Parotiditis [K11.20] Pain [R52] Acute parotitis [K11.21] AKI (acute kidney injury) ([place]) [N17.9] Urinary tract infection associated with indwelling urethral catheter, initial encounter ([place]) [F02.637C, N39.0] Patient Active Problem List   Diagnosis Date Noted   Acute parotitis 01/17/2021   SDH (subdural hematoma) 06/05/2020   Recurrent UTI 03/11/2020   Exudative age-related macular degeneration of right eye with inactive choroidal neovascularization ([place]) 12/20/2019   Degenerative retinal drusen of both eyes 12/20/2019   Exudative age-related macular degeneration of left eye with inactive choroidal neovascularization ([place]) 12/20/2019   Traumatic subdural hematoma 08/08/2018   Pyelonephritis  09/09/2016   Falls frequently 06/24/2016   Tension headache 11/10/2013   Chest pain 11/03/2011   AKI (acute kidney injury) ([place]) 04/30/2011   UTI (urinary tract infection) 04/30/2011   CKD (chronic kidney disease), stage IV ([place]) 04/30/2011   Anemia 04/30/2011   S/p nephrectomy 04/30/2011   Macular degeneration 04/30/2011   Cataract 04/30/2011   Bladder cancer ([place]) 04/30/2011   Weakness generalized 04/29/2011   Seizure disorder ([place]) 04/29/2011   Benign hypertension 04/29/2011   CAD (coronary artery disease) 04/29/2011   Hyperlipidemia 04/29/2011   Anxiety 04/29/2011   Dizziness - light-headed 04/29/2011   Chronic abdominal pain 04/29/2011   Mixed hyperlipidemia 05/24/2008   Essential hypertension, benign 05/24/2008   Postsurgical aortocoronary bypass status 05/24/2008   PCP:  Celene Squibb, MD Pharmacy:   [facility] [place] [address] [place] [address] Phone: [phone] Fax: [phone]     Social Determinants of Health (SDOH) Interventions  No SDOH interventions requested or needed at this time.  Readmission Risk Interventions Readmission Risk Prevention Plan 06/07/2020  Transportation Screening Complete  Social Work Consult for [place] Planning/Counseling [place] Not Applicable  Medication Review Press photographer) Complete  Some recent data might be hidden

## 2021-01-22 DIAGNOSIS — K1121 Acute sialoadenitis: Secondary | ICD-10-CM | POA: Diagnosis not present

## 2021-01-22 LAB — CBC
HCT: 34 % — ABNORMAL LOW (ref 36.0–46.0)
Hemoglobin: 11.2 g/dL — ABNORMAL LOW (ref 12.0–15.0)
MCH: 28.9 pg (ref 26.0–34.0)
MCHC: 32.9 g/dL (ref 30.0–36.0)
MCV: 87.6 fL (ref 80.0–100.0)
Platelets: 211 10*3/uL (ref 150–400)
RBC: 3.88 MIL/uL (ref 3.87–5.11)
RDW: 15 % (ref 11.5–15.5)
WBC: 6.5 10*3/uL (ref 4.0–10.5)
nRBC: 0 % (ref 0.0–0.2)

## 2021-01-22 LAB — BASIC METABOLIC PANEL
Anion gap: 10 (ref 5–15)
BUN: 33 mg/dL — ABNORMAL HIGH (ref 8–23)
CO2: 21 mmol/L — ABNORMAL LOW (ref 22–32)
Calcium: 8.3 mg/dL — ABNORMAL LOW (ref 8.9–10.3)
Chloride: 108 mmol/L (ref 98–111)
Creatinine, Ser: 2.01 mg/dL — ABNORMAL HIGH (ref 0.44–1.00)
GFR, Estimated: 23 mL/min — ABNORMAL LOW (ref >60)
Glucose, Bld: 155 mg/dL — ABNORMAL HIGH (ref 70–99)
Potassium: 4 mmol/L (ref 3.5–5.1)
Sodium: 139 mmol/L (ref 135–145)

## 2021-01-22 LAB — PHOSPHORUS: Phosphorus: 2.9 mg/dL (ref 2.5–4.6)

## 2021-01-22 LAB — MAGNESIUM: Magnesium: 1.8 mg/dL (ref 1.7–2.4)

## 2021-01-22 MED ORDER — SODIUM CHLORIDE 0.9 % IV SOLN: INTRAVENOUS | Status: DC

## 2021-01-22 NOTE — Progress Notes (Signed)
PT Cancellation Note  Patient Details Name: FLORINDA TAFLINGER MRN: [id number] DOB: 03-14-1929   Cancelled Treatment:    Reason Eval/Treat Not Completed: PT screened, no needs identified, will sign off. Per Education officer, museum, patient is long term resident at [facility]. She does not require PT evaluation to return.      Alejo Beamer 01/22/2021, 12:12 PM

## 2021-01-22 NOTE — Progress Notes (Signed)
OT Cancellation Note  Patient Details Name: ALEXSANDRIA KIVETT MRN: [id number] DOB: 07-Mar-1929   Cancelled Treatment:    Reason Eval/Treat Not Completed: OT screened, no needs identified, will sign off. Per chart review patient LTC resident from [facility]. Wheelchair bound requiring assist for transfer to wheelchair at baseline.   Gloris Manchester OTR/L Supplemental OT, Department of rehab services [phone]  Chyna Kneece R H. 01/22/2021, 1:41 PM

## 2021-01-22 NOTE — Progress Notes (Signed)
PROGRESS NOTE    [name] [name]  EZM:[id number] DOB: 15-Jan-1929 DOA: 01/17/2021 PCP: Celene Squibb, MD    Brief Narrative:  This 87 years old female with PMH significant for CKD stage IV, history of bladder cancer, hypertension, seizure disorder, chronic back pain, UTIs, chronic Foley catheter, wheelchair-bound at baseline presented from the nursing home with complaints of right facial pain for 2 days.  In the ED it was found out that she has parotitis.  Patient was treated with IV fluids and antibiotics and admitted for further work-up and management.  Assessment & Plan:   Principal Problem:   Acute parotitis Active Problems:   Seizure disorder ([employer])   Benign hypertension   AKI (acute kidney injury) ([place])   CKD (chronic kidney disease), stage IV (HCC)   Bladder cancer (H[facility])   Recurrent UTI  Parotitis / Right mandibular swelling: Patient presented with right parotid gland swelling with erythema. Patient started on meropenem due to poor clinical picture and multiple allergies. Patient is clinically stable without respiratory or oropharynx compression. Antibiotics changed to Rocephin and Flagyl. Continue adequate pain control. Speech and swallow evaluation recommended dysphagia 2 diet. Discussed with ENT recommended Decadron 8 mg x 3 doses. There is significant improvement in pain and swelling.  AKI on CKD stage IV: Patient does have chronic indwelling Foley catheter but is asymptomatic for UTI. Patient had appointment for Foley exchange on 10/18. Renal ultrasound consistent with right nephrectomy, left renal cortical thinning and increased cortical echotexture consistent with medical renal disease. Creatinine improving 3.92> 3.29 >2.89>2.57 >2.11>2.01 Urine culture grew ESBL pan resistant, Patient denies any urinary symptoms, this could be colonization. Continue ceftriaxone and Flagyl for now for parotitis. Continue gentle IV hydration.  Seizure disorder: Continue home  Keppra.  Essential hypertension: Continue amlodipine  Bladder cancer: Follow-up outpatient with palliative care.  Depression: Patient takes Zoloft,  Patient also on several medications that can cause CNS depression including gabapentin, diphenhydramine,  clonazepam, oxybutynin, tizanidine.  will discontinue all at this point.  Hypokalemia: Resolved.  Hypophosphatemia: Resolved  DVT prophylaxis: Heparin subcu Code Status: DNR Family Communication: No family at bedside Disposition Plan:    Status is: Inpatient  Remains inpatient appropriate because: Further work-up of parotitis Anticipated discharge home in 1 to 2 days    Consultants:  None  Procedures: CT face Antimicrobials:   Anti-infectives (From admission, onward)    Start     Dose/Rate Route Frequency Ordered Stop   01/20/21 2000  cefTRIAXone (ROCEPHIN) 2 g in sodium chloride 0.9 % 100 mL IVPB        2 g 200 mL/hr over 30 Minutes Intravenous Every 24 hours 01/20/21 1216     01/20/21 2000  metroNIDAZOLE (FLAGYL) IVPB 500 mg        500 mg 100 mL/hr over 60 Minutes Intravenous Every 12 hours 01/20/21 1216     01/17/21 2015  meropenem (MERREM) 1 g in sodium chloride 0.9 % 100 mL IVPB  Status:  Discontinued        1 g 200 mL/hr over 30 Minutes Intravenous Every 24 hours 01/17/21 2014 01/20/21 1216   01/17/21 1945  cefTRIAXone (ROCEPHIN) 1 g in sodium chloride 0.9 % 100 mL IVPB  Status:  Discontinued        1 g 200 mL/hr over 30 Minutes Intravenous Every 24 hours 01/17/21 1941 01/17/21 2005   01/17/21 1945  metroNIDAZOLE (FLAGYL) IVPB 500 mg  Status:  Discontinued        500 mg 100  mL/hr over 60 Minutes Intravenous Every 12 hours 01/17/21 1941 01/17/21 2005       Subjective: Patient was seen and examined at bedside.  Overnight events noted.   Patient has visible right periarticular swelling which is tender and nonerythematous. Patient is alert and awake,  involved in full conversation.  Objective: Vitals:    01/21/21 2126 01/22/21 0552 01/22/21 0555 01/22/21 0943  BP: (!) 162/67 (!) 170/65 (!) 162/60 (!) 186/60  Pulse: 66 (!) 54  61  Resp: 18 18  16   Temp: 98 F (36.7 C) 98.4 F (36.9 C)    TempSrc:      SpO2: 93% 93%  96%  Weight:      Height:        Intake/Output Summary (Last 24 hours) at 01/22/2021 1355 Last data filed at 01/22/2021 0851 Gross per 24 hour  Intake 1890.04 ml  Output 800 ml  Net 1090.04 ml   Filed Weights   01/17/21 1311 01/18/21 1548 01/21/21 0516  Weight: 75.3 kg 68.4 kg 68.6 kg    Examination:  General exam: Appears comfortable, not in any acute distress.  Deconditioned. Face: Right preauricular swelling, which is tender, normal temperature, nonerythematous Respiratory system: Clear to auscultation bilaterally, RR 15.   Cardiovascular system: S1-S2 heard, regular rate and rhythm, no murmur.   Gastrointestinal system: Abdomen soft, nontender, nondistended, BS+ Central nervous system: Alert and oriented X 2. No focal neurological deficits. Extremities: No edema, no cyanosis, no clubbing. Skin: No rashes, lesions or ulcers Psychiatry: Judgement and insight appear normal. Mood & affect appropriate.     Data Reviewed: I have personally reviewed following labs and imaging studies  CBC: Recent Labs  Lab 01/17/21 1539 01/18/21 0500 01/19/21 0656 01/20/21 0341 01/21/21 0629 01/22/21 0418  WBC 9.5 17.9* 20.3* 17.4* 10.9* 6.5  NEUTROABS 6.1  --   --   --   --   --   HGB 12.8 13.8 11.5* 11.6* 11.0* 11.2*  HCT 39.8 42.8 33.5* 34.0* 31.9* 34.0*  MCV 91.1 90.3 85.9 86.1 85.1 87.6  PLT 202 193 186 196 204 841   Basic Metabolic Panel: Recent Labs  Lab 01/18/21 0500 01/19/21 0656 01/20/21 0341 01/21/21 0629 01/22/21 0418  NA 139 140 138 135 139  K 4.1 3.2* 2.8* 2.9* 4.0  CL 110 110 108 105 108  CO2 13* 18* 20* 21* 21*  GLUCOSE 133* 119* 99 104* 155*  BUN 32* 35* 35* 32* 33*  CREATININE 3.29* 2.89* 2.57* 2.11* 2.01*  CALCIUM 8.9 8.7* 8.6* 8.0*  8.3*  MG  --   --  1.9 1.8 1.8  PHOS  --   --  2.2* 3.1 2.9   GFR: Estimated Creatinine Clearance: 16.9 mL/min (A) (by C-G formula based on SCr of 2.01 mg/dL (H)). Liver Function Tests: Recent Labs  Lab 01/17/21 1539 01/19/21 0656  AST 25 29  ALT 11 12  ALKPHOS 72 66  BILITOT 0.7 0.8  PROT 7.0 5.8*  ALBUMIN 3.4* 2.5*   Recent Labs  Lab 01/17/21 1539  LIPASE 39   No results for input(s): AMMONIA in the last 168 hours. Coagulation Profile: No results for input(s): INR, PROTIME in the last 168 hours. Cardiac Enzymes: No results for input(s): CKTOTAL, CKMB, CKMBINDEX, TROPONINI in the last 168 hours. BNP (last 3 results) No results for input(s): PROBNP in the last 8760 hours. HbA1C: No results for input(s): HGBA1C in the last 72 hours. CBG: No results for input(s): GLUCAP in the last 168 hours. Lipid  Profile: No results for input(s): CHOL, HDL, LDLCALC, TRIG, CHOLHDL, LDLDIRECT in the last 72 hours. Thyroid Function Tests: No results for input(s): TSH, T4TOTAL, FREET4, T3FREE, THYROIDAB in the last 72 hours. Anemia Panel: No results for input(s): VITAMINB12, FOLATE, FERRITIN, TIBC, IRON, RETICCTPCT in the last 72 hours. Sepsis Labs: Recent Labs  Lab 01/17/21 1456 01/17/21 1749  LATICACIDVEN 1.1 1.1    Recent Results (from the past 240 hour(s))  Resp Panel by RT-PCR (Flu A&B, Covid) Nasopharyngeal Swab     Status: None   Collection Time: 01/17/21  3:18 PM   Specimen: Nasopharyngeal Swab; Nasopharyngeal(NP) swabs in vial transport medium  Result Value Ref Range Status   SARS Coronavirus 2 by RT PCR NEGATIVE NEGATIVE Final    Comment: (NOTE) SARS-CoV-2 target nucleic acids are NOT DETECTED.  The SARS-CoV-2 RNA is generally detectable in upper respiratory specimens during the acute phase of infection. The lowest concentration of SARS-CoV-2 viral copies this assay can detect is 138 copies/mL. A negative result does not preclude SARS-Cov-2 infection and should not  be used as the sole basis for treatment or other patient management decisions. A negative result may occur with  improper specimen collection/handling, submission of specimen other than nasopharyngeal swab, presence of viral mutation(s) within the areas targeted by this assay, and inadequate number of viral copies(<138 copies/mL). A negative result must be combined with clinical observations, patient history, and epidemiological information. The expected result is Negative.  Fact Sheet for Patients:  EntrepreneurPulse.com.au  Fact Sheet for Healthcare Providers:  IncredibleEmployment.be  This test is no t yet approved or cleared by the [place] FDA and  has been authorized for detection and/or diagnosis of SARS-CoV-2 by FDA under an Emergency Use Authorization (EUA). This EUA will remain  in effect (meaning this test can be used) for the duration of the COVID-19 declaration under Section 564(b)(1) of the Act, 21 U.S.C.section 360bbb-3(b)(1), unless the authorization is terminated  or revoked sooner.       Influenza A by PCR NEGATIVE NEGATIVE Final   Influenza B by PCR NEGATIVE NEGATIVE Final    Comment: (NOTE) The Xpert Xpress SARS-CoV-2/FLU/RSV plus assay is intended as an aid in the diagnosis of influenza from Nasopharyngeal swab specimens and should not be used as a sole basis for treatment. Nasal washings and aspirates are unacceptable for Xpert Xpress SARS-CoV-2/FLU/RSV testing.  Fact Sheet for Patients: EntrepreneurPulse.com.au  Fact Sheet for Healthcare Providers: IncredibleEmployment.be  This test is not yet approved or cleared by the [place] FDA and has been authorized for detection and/or diagnosis of SARS-CoV-2 by FDA under an Emergency Use Authorization (EUA). This EUA will remain in effect (meaning this test can be used) for the duration of the COVID-19 declaration under Section  564(b)(1) of the Act, 21 U.S.C. section 360bbb-3(b)(1), unless the authorization is terminated or revoked.  Performed at [facility], [place] [address] [place], [place] [address]   Urine Culture     Status: Abnormal   Collection Time: 01/17/21  3:36 PM   Specimen: Urine, Clean Catch  Result Value Ref Range Status   Specimen Description URINE, CLEAN CATCH  Final   Special Requests   Final    NONE Performed at [facility], [place] [address] [place], [place] [address]    Culture (A)  Final    >=100,000 COLONIES/mL ESCHERICHIA COLI Confirmed Extended Spectrum Beta-Lactamase Producer (ESBL).  In bloodstream infections from ESBL organisms, carbapenems are preferred over piperacillin/tazobactam. They are shown to have  a lower risk of mortality.    Report Status 01/20/2021 FINAL  Final   Organism ID, Bacteria ESCHERICHIA COLI (A)  Final      Susceptibility   Escherichia coli - MIC*    AMPICILLIN >=32 RESISTANT Resistant     CEFAZOLIN >=64 RESISTANT Resistant     CEFEPIME 16 RESISTANT Resistant     CEFTRIAXONE >=64 RESISTANT Resistant     CIPROFLOXACIN >=4 RESISTANT Resistant     GENTAMICIN <=1 SENSITIVE Sensitive     IMIPENEM <=0.25 SENSITIVE Sensitive     NITROFURANTOIN 128 RESISTANT Resistant     TRIMETH/SULFA >=320 RESISTANT Resistant     AMPICILLIN/SULBACTAM 8 SENSITIVE Sensitive     PIP/TAZO <=4 SENSITIVE Sensitive     * >=100,000 COLONIES/mL ESCHERICHIA COLI  MRSA Next Gen by PCR, Nasal     Status: None   Collection Time: 01/17/21  9:47 PM   Specimen: Nasal Mucosa; Nasal Swab  Result Value Ref Range Status   MRSA by PCR Next Gen NOT DETECTED NOT DETECTED Final    Comment: (NOTE) The GeneXpert MRSA Assay (FDA approved for NASAL specimens only), is one component of a comprehensive MRSA colonization surveillance program. It is not intended to diagnose MRSA infection nor to guide or monitor treatment for MRSA infections. Test performance is not FDA approved in  patients less than 40 years old. Performed at [facility], [place] [address], [place], [place] [address]     Radiology Studies: No results found.  Scheduled Meds:  amLODipine  2.5 mg Oral Daily   Chlorhexidine Gluconate Cloth  6 each Topical Daily   heparin  5,000 Units Subcutaneous Q8H   levETIRAcetam  250 mg Oral BID   Continuous Infusions:  sodium chloride     cefTRIAXone (ROCEPHIN)  IV 2 g (01/21/21 2132)   lactated ringers 100 mL/hr at 01/21/21 2128   metronidazole 500 mg (01/22/21 0852)     LOS: 4 days    Time spent: 25 mins    Lenetta Piche, MD [facility]   If 7PM-7AM, please contact night-coverage

## 2021-01-23 DIAGNOSIS — I10 Essential (primary) hypertension: Secondary | ICD-10-CM | POA: Diagnosis not present

## 2021-01-23 DIAGNOSIS — N179 Acute kidney failure, unspecified: Secondary | ICD-10-CM | POA: Diagnosis not present

## 2021-01-23 DIAGNOSIS — K1121 Acute sialoadenitis: Secondary | ICD-10-CM | POA: Diagnosis not present

## 2021-01-23 DIAGNOSIS — N184 Chronic kidney disease, stage 4 (severe): Secondary | ICD-10-CM | POA: Diagnosis not present

## 2021-01-23 LAB — RESP PANEL BY RT-PCR (FLU A&B, COVID) ARPGX2
Influenza A by PCR: NEGATIVE
Influenza B by PCR: NEGATIVE
SARS Coronavirus 2 by RT PCR: NEGATIVE

## 2021-01-23 LAB — BASIC METABOLIC PANEL
Anion gap: 9 (ref 5–15)
BUN: 39 mg/dL — ABNORMAL HIGH (ref 8–23)
CO2: 21 mmol/L — ABNORMAL LOW (ref 22–32)
Calcium: 8.2 mg/dL — ABNORMAL LOW (ref 8.9–10.3)
Chloride: 107 mmol/L (ref 98–111)
Creatinine, Ser: 2.04 mg/dL — ABNORMAL HIGH (ref 0.44–1.00)
GFR, Estimated: 23 mL/min — ABNORMAL LOW (ref >60)
Glucose, Bld: 129 mg/dL — ABNORMAL HIGH (ref 70–99)
Potassium: 3.6 mmol/L (ref 3.5–5.1)
Sodium: 137 mmol/L (ref 135–145)

## 2021-01-23 LAB — CBC
HCT: 33.1 % — ABNORMAL LOW (ref 36.0–46.0)
Hemoglobin: 11.1 g/dL — ABNORMAL LOW (ref 12.0–15.0)
MCH: 29.4 pg (ref 26.0–34.0)
MCHC: 33.5 g/dL (ref 30.0–36.0)
MCV: 87.6 fL (ref 80.0–100.0)
Platelets: 212 10*3/uL (ref 150–400)
RBC: 3.78 MIL/uL — ABNORMAL LOW (ref 3.87–5.11)
RDW: 14.9 % (ref 11.5–15.5)
WBC: 12.1 10*3/uL — ABNORMAL HIGH (ref 4.0–10.5)
nRBC: 0 % (ref 0.0–0.2)

## 2021-01-23 MED ORDER — HYDROCODONE-ACETAMINOPHEN 5-325 MG PO TABS: 1.0000 | ORAL_TABLET | Freq: Two times a day (BID) | ORAL | 0 refills | Status: AC

## 2021-01-23 MED ORDER — LEVOFLOXACIN 500 MG PO TABS: 500.0000 mg | ORAL_TABLET | Freq: Every day | ORAL | 0 refills | Status: DC

## 2021-01-23 MED ORDER — METRONIDAZOLE 500 MG PO TABS: 500.0000 mg | ORAL_TABLET | Freq: Two times a day (BID) | ORAL | 0 refills | Status: DC

## 2021-01-23 MED ORDER — LEVETIRACETAM 250 MG PO TABS: 250.0000 mg | ORAL_TABLET | Freq: Two times a day (BID) | ORAL | 3 refills | Status: AC

## 2021-01-23 NOTE — Discharge Summary (Signed)
Physician Discharge Summary  [name] [name] XHB:[id number] DOB: Jul 18, 1928 DOA: 01/17/2021  PCP: Celene Squibb, MD  Admit date: 01/17/2021 Discharge date: 01/24/2021  Admitted From:  Home Disposition:  SNF  Recommendations for Outpatient Follow-up:  Follow up with PCP in 1-2 weeks Please obtain BMP/CBC in one week   Discharge Condition:stable.  CODE STATUS:DNR Diet recommendation: Heart Healthy / Carb Modified   Brief/Interim Summary:This 87 years old female with PMH significant for CKD stage IV, history of bladder cancer, hypertension, seizure disorder, chronic back pain, UTIs, chronic Foley catheter, wheelchair-bound at baseline presented from the nursing home with complaints of right facial pain for 2 days.  In the ED it was found out that she has parotitis.  Patient was treated with IV fluids and antibiotics and admitted for further work-up and management.  Discharge Diagnoses:  Principal Problem:   Acute parotitis Active Problems:   Seizure disorder ([place])   Benign hypertension   AKI (acute kidney injury) ([place])   CKD (chronic kidney disease), stage IV (H[facility])   Bladder cancer ([facility])   Recurrent UTI    Parotitis / Right mandibular swelling: Patient presented with right parotid gland swelling with erythema. Patient started on meropenem due to poor clinical picture and multiple allergies. Patient is clinically stable without respiratory or oropharynx compression. Will transition to oral antibiotics on discharge. Continue adequate pain control. Speech and swallow evaluation recommended dysphagia 2 diet. Discussed with ENT recommended Decadron 8 mg x 3 doses. There is significant improvement in pain and swelling.   AKI on CKD stage IV: Patient does have chronic indwelling Foley catheter but is asymptomatic for UTI. Patient had appointment for Foley exchange on 10/18. Renal ultrasound consistent with right nephrectomy, left renal cortical thinning and increased cortical  echotexture consistent with medical renal disease. Creatinine improving 3.92> 3.29 >2.89>2.57 >2.11>2.01 Urine culture grew ESBL pan resistant, Patient denies any urinary symptoms, this could be colonization.  Seizure disorder: Continue home Keppra.   Essential hypertension: Continue amlodipine   Bladder cancer: Follow-up outpatient with palliative care.   Depression: Patient takes Zoloft,  Patient also on several medications that can cause CNS depression including gabapentin, diphenhydramine,  clonazepam, oxybutynin, tizanidine.  will discontinue all at this point.   Hypokalemia: Resolved.   Hypophosphatemia: Resolved   Discharge Instructions   Allergies as of 01/23/2021       Reactions   Adhesive [tape] Other (See Comments)   Tears patients skin off.    Latex Itching   Macrobid [nitrofurantoin] Other (See Comments)   unknown   Penicillins Hives, Itching   Sulfa Antibiotics Hives, Itching        Medication List     TAKE these medications    acetaminophen 325 MG tablet Commonly known as: TYLENOL Take 325 mg by mouth every 6 (six) hours as needed for mild pain.   amLODipine 2.5 MG tablet Commonly known as: NORVASC Take 2.5 mg by mouth daily.   Benefiber Powd Take 1 Scoop by mouth daily at 6 (six) AM.   Cranberry 450 MG Tabs Take 450 mg by mouth daily.   Eyescrub Pads Apply 1 each topically daily.   HYDROcodone-acetaminophen 5-325 MG tablet Commonly known as: NORCO/VICODIN Take 1 tablet by mouth in the morning and at bedtime for 3 days.   levETIRAcetam 250 MG tablet Commonly known as: KEPPRA Take 1 tablet (250 mg total) by mouth 2 (two) times daily.   levofloxacin 500 MG tablet Commonly known as: Levaquin Take 1 tablet (500 mg total) by  mouth daily for 6 days.   linaclotide 290 MCG Caps capsule Commonly known as: LINZESS Take 290 mcg by mouth daily.   loperamide 2 MG capsule Commonly known as: IMODIUM Take 2 mg by mouth every 4 (four)  hours as needed for diarrhea or loose stools.   melatonin 3 MG Tabs tablet Take 3 mg by mouth at bedtime.   metroNIDAZOLE 500 MG tablet Commonly known as: Flagyl Take 1 tablet (500 mg total) by mouth 2 (two) times daily for 6 days.   NONFORMULARY OR COMPOUNDED ITEM Apply 1 application topically in the morning and at bedtime. Anti -itch lotion 0.5-0.5% ( Mix with triamcinolone)   oxybutynin 10 MG 24 hr tablet Commonly known as: DITROPAN-XL Take 10 mg by mouth daily.   phenol 1.4 % Liqd Commonly known as: CHLORASEPTIC Use as directed 1 spray in the mouth or throat every 4 (four) hours as needed for throat irritation / pain.   PRESERVISION AREDS PO Take 1 capsule by mouth daily.   rosuvastatin 10 MG tablet Commonly known as: CRESTOR Take 10 mg by mouth daily.   sertraline 25 MG tablet Commonly known as: ZOLOFT Take 25 mg by mouth daily.   triamcinolone cream 0.1 % Commonly known as: KENALOG Apply 1 application topically 2 (two) times daily. Mix with Sarna lotion        Allergies  Allergen Reactions   Adhesive [Tape] Other (See Comments)    Tears patients skin off.    Latex Itching   Macrobid [Nitrofurantoin] Other (See Comments)    unknown   Penicillins Hives and Itching   Sulfa Antibiotics Hives and Itching    Consultations: None.    Procedures/Studies: CT HEAD WO CONTRAST (5MM)  Result Date: 01/17/2021 CLINICAL DATA:  Nystagmus or other irregular eye movement right sided pain and weakness; maxillofacial pain eval parotid/submandibular swelling EXAM: CT HEAD WITHOUT CONTRAST CT MAXILLOFACIAL WITHOUT CONTRAST TECHNIQUE: Multidetector CT imaging of the head and maxillofacial structures were performed using the standard protocol without intravenous contrast. Multiplanar CT image reconstructions of the maxillofacial structures were also generated. COMPARISON:  06/06/2020 CT head FINDINGS: CT HEAD FINDINGS Brain: There is no acute intracranial hemorrhage, mass  effect, or edema. Gray-white differentiation is preserved. The extra-axial collection along the left cerebral convexity has resolved. Falcine subdural hematoma is also expectedly resolved. Prominence of the ventricles and sulci reflects stable parenchymal volume loss. Patchy low-attenuation in the supratentorial white matter probably reflects stable chronic microvascular ischemic changes. Small chronic infarcts of the right corona radiata and basal ganglia as seen previously. Vascular: There is intracranial atherosclerotic calcification at the skull base. Skull: Unremarkable apart from right parietal craniotomy. Other: Mastoid air cells are clear. CT MAXILLOFACIAL FINDINGS Osseous: No acute abnormality. Orbits: Bilateral lens replacements.  Otherwise unremarkable. Sinuses: No significant opacification. Soft tissues: There is infiltration of the fat surrounding the right parotid. The inferior extent is not imaged. Right parotid is enlarged with increased density. No stones within the gland or duct. IMPRESSION: No acute intracranial abnormality. Subdural collections have resolved. Stable chronic/nonemergent findings detailed above. Findings most consistent with right parotitis. Electronically Signed   By: Macy Mis M.D.   On: 01/17/2021 16:43   CT Cervical Spine Wo Contrast  Result Date: 01/17/2021 CLINICAL DATA:  Cervical radiculopathy, infection suspected EXAM: CT CERVICAL SPINE WITHOUT CONTRAST TECHNIQUE: Multidetector CT imaging of the cervical spine was performed without intravenous contrast. Multiplanar CT image reconstructions were also generated. COMPARISON:  June 05, 2020 FINDINGS: Alignment: Stable. Skull base and vertebrae:  Stable vertebral body heights. Partial fusion across the C2-C3 disc space is again noted. As before, there is fusion of the C2-C3 facets. There is no destructive osseous lesion. Soft tissues and spinal canal: No prevertebral fluid or swelling. No visible canal hematoma. Disc  levels: Multilevel degenerative changes are present including disc space narrowing, endplate osteophytes, and facet and uncovertebral hypertrophy. These findings are not substantially changed from prior study. There is no high-grade osseous encroachment on the spinal canal. Foraminal narrowing is greatest on the left at C3-C4 and bilaterally at C5-C6. Upper chest: No apical lung mass. Other: Right periparotid inflammatory changes extend to the level of the hyoid. IMPRESSION: No acute abnormality or significant change since prior examination. Multilevel degenerative changes are stable. Electronically Signed   By: Macy Mis M.D.   On: 01/17/2021 16:50   US RENAL  Result Date: 01/17/2021 CLINICAL DATA:  Acute renal insufficiency EXAM: RENAL / URINARY TRACT ULTRASOUND COMPLETE COMPARISON:  01/17/2021 FINDINGS: Right Kidney: Surgically absent. Left Kidney: Renal measurements: 8.3 x 4.8 x 4.6 cm = volume: 96 mL. Renal cortical thinning, with increased renal cortical echotexture consistent with medical renal disease. No hydronephrosis or nephrolithiasis. Bladder: Foley catheter decompresses the bladder.  Evaluation is limited. Other: None. IMPRESSION: 1. Status post right nephrectomy. 2. Left renal cortical thinning and increased cortical echotexture consistent with medical renal disease. Electronically Signed   By: Randa Ngo M.D.   On: 01/17/2021 21:34   CT CHEST ABDOMEN PELVIS WO CONTRAST  Result Date: 01/17/2021 CLINICAL DATA:  Nonlocalized acute abdominal pain. EXAM: CT CHEST, ABDOMEN AND PELVIS WITHOUT CONTRAST TECHNIQUE: Multidetector CT imaging of the chest, abdomen and pelvis was performed following the standard protocol without IV contrast. COMPARISON:  CT abdomen pelvis 04/25/2020 FINDINGS: CT CHEST FINDINGS Cardiovascular: Normal heart size. No significant pericardial effusion. The thoracic aorta is normal in caliber. Moderate to severe atherosclerotic plaque of the thoracic aorta.  Four-vessel coronary artery calcifications status post coronary artery bypass graft. Mediastinum/Nodes: No gross hilar adenopathy, noting limited sensitivity for the detection of hilar adenopathy on this noncontrast study. No enlarged mediastinal or axillary lymph nodes. Thyroid gland, trachea, and esophagus demonstrate no significant findings. Lungs/Pleura: Scattered punctate calcifications within the lungs likely sequelae of prior granulomatous disease. No pulmonary mass. Focal consolidation. Trace left pleural effusion. Associated left lower lobe passive atelectasis. No right pleural effusion. No pneumothorax. Musculoskeletal: No chest wall abnormality. No suspicious lytic or blastic osseous lesions. No acute displaced fracture. Sternotomy wires appear intact. Severe degenerative changes of the left shoulder. CT ABDOMEN PELVIS FINDINGS Hepatobiliary: No focal liver abnormality. Nonspecific slightly hydropic gallbladder. No gallstones, gallbladder wall thickening, or pericholecystic fluid. No biliary dilatation. Pancreas: No focal lesion. Normal pancreatic contour. No surrounding inflammatory changes. No main pancreatic ductal dilatation. Spleen: Normal in size without focal abnormality. Adrenals/Urinary Tract: No adrenal nodule bilaterally. Status post right nephrectomy. Left renal cortical scarring. No nephrolithiasis and no hydronephrosis. Urothelial thickening of the left proximal ureter. No ureterolithiasis or hydroureter. Urinary bladder wall thickening and perivesicular fat stranding. Foley catheter terminates within the urinary bladder lumen. Stomach/Bowel: Stomach is within normal limits. Question incidentally noted partial small bowel malrotation. No evidence of bowel wall thickening or dilatation. Colonic diverticulosis. The appendix not definitely identified. Vascular/Lymphatic: No abdominal aorta or iliac aneurysm. Severe atherosclerotic plaque of the aorta and its branches. No abdominal, pelvic, or  inguinal lymphadenopathy. Reproductive: Status post hysterectomy. No adnexal masses. Other: No intraperitoneal free fluid. No intraperitoneal free gas. No organized fluid collection. Musculoskeletal: No abdominal  wall hernia or abnormality. No suspicious lytic or blastic osseous lesions. No acute displaced fracture. Severe degenerative changes of the lumbar spine. Kyphoplasty of the L1 level. Dextroscoliosis of the spine centered at the L1-L2 level. IMPRESSION: 1. Please note limited evaluation on this noncontrast study. 2. Trace left pleural effusion. 3. Urothelial thickening of the left proximal ureter as well as urinary bladder wall thickening and perivesicular fat stranding consistent with infection. Correlate with urinalysis. 4. Scattered colonic diverticulosis with no acute diverticulitis. 5.  Aortic Atherosclerosis (ICD10-I70.0). 6. Status post right nephrectomy. Electronically Signed   By: Iven Finn M.D.   On: 01/17/2021 16:44   CT Maxillofacial Wo Contrast  Result Date: 01/17/2021 CLINICAL DATA:  Nystagmus or other irregular eye movement right sided pain and weakness; maxillofacial pain eval parotid/submandibular swelling EXAM: CT HEAD WITHOUT CONTRAST CT MAXILLOFACIAL WITHOUT CONTRAST TECHNIQUE: Multidetector CT imaging of the head and maxillofacial structures were performed using the standard protocol without intravenous contrast. Multiplanar CT image reconstructions of the maxillofacial structures were also generated. COMPARISON:  06/06/2020 CT head FINDINGS: CT HEAD FINDINGS Brain: There is no acute intracranial hemorrhage, mass effect, or edema. Gray-white differentiation is preserved. The extra-axial collection along the left cerebral convexity has resolved. Falcine subdural hematoma is also expectedly resolved. Prominence of the ventricles and sulci reflects stable parenchymal volume loss. Patchy low-attenuation in the supratentorial white matter probably reflects stable chronic  microvascular ischemic changes. Small chronic infarcts of the right corona radiata and basal ganglia as seen previously. Vascular: There is intracranial atherosclerotic calcification at the skull base. Skull: Unremarkable apart from right parietal craniotomy. Other: Mastoid air cells are clear. CT MAXILLOFACIAL FINDINGS Osseous: No acute abnormality. Orbits: Bilateral lens replacements.  Otherwise unremarkable. Sinuses: No significant opacification. Soft tissues: There is infiltration of the fat surrounding the right parotid. The inferior extent is not imaged. Right parotid is enlarged with increased density. No stones within the gland or duct. IMPRESSION: No acute intracranial abnormality. Subdural collections have resolved. Stable chronic/nonemergent findings detailed above. Findings most consistent with right parotitis. Electronically Signed   By: Macy Mis M.D.   On: 01/17/2021 16:43   (Echo, Carotid, EGD, Colonoscopy, ERCP)    Subjective:   Discharge Exam: Vitals:   01/22/21 2140 01/23/21 0946  BP: (!) 158/60 (!) 160/69  Pulse: 66 61  Resp: 18 16  Temp: 98.4 F (36.9 C) 98 F (36.7 C)  SpO2: 95% 97%   Vitals:   01/22/21 0943 01/22/21 1713 01/22/21 2140 01/23/21 0946  BP: (!) 186/60 (!) 164/67 (!) 158/60 (!) 160/69  Pulse: 61 64 66 61  Resp: 16 17 18 16   Temp:  97.6 F (36.4 C) 98.4 F (36.9 C) 98 F (36.7 C)  TempSrc:  Oral  Oral  SpO2: 96% 93% 95% 97%  Weight:      Height:        General: Pt is alert, awake, not in acute distress Cardiovascular: RRR, S1/S2 +, no rubs, no gallops Respiratory: CTA bilaterally, no wheezing, no rhonchi Abdominal: Soft, NT, ND, bowel sounds + Extremities: no edema, no cyanosis    The results of significant diagnostics from this hospitalization (including imaging, microbiology, ancillary and laboratory) are listed below for reference.     Microbiology: Recent Results (from the past 240 hour(s))  Resp Panel by RT-PCR (Flu A&B, Covid)  Nasopharyngeal Swab     Status: None   Collection Time: 01/17/21  3:18 PM   Specimen: Nasopharyngeal Swab; Nasopharyngeal(NP) swabs in vial transport medium  Result Value  Ref Range Status   SARS Coronavirus 2 by RT PCR NEGATIVE NEGATIVE Final    Comment: (NOTE) SARS-CoV-2 target nucleic acids are NOT DETECTED.  The SARS-CoV-2 RNA is generally detectable in upper respiratory specimens during the acute phase of infection. The lowest concentration of SARS-CoV-2 viral copies this assay can detect is 138 copies/mL. A negative result does not preclude SARS-Cov-2 infection and should not be used as the sole basis for treatment or other patient management decisions. A negative result may occur with  improper specimen collection/handling, submission of specimen other than nasopharyngeal swab, presence of viral mutation(s) within the areas targeted by this assay, and inadequate number of viral copies(<138 copies/mL). A negative result must be combined with clinical observations, patient history, and epidemiological information. The expected result is Negative.  Fact Sheet for Patients:  EntrepreneurPulse.com.au  Fact Sheet for Healthcare Providers:  IncredibleEmployment.be  This test is no t yet approved or cleared by the [place] FDA and  has been authorized for detection and/or diagnosis of SARS-CoV-2 by FDA under an Emergency Use Authorization (EUA). This EUA will remain  in effect (meaning this test can be used) for the duration of the COVID-19 declaration under Section 564(b)(1) of the Act, 21 U.S.C.section 360bbb-3(b)(1), unless the authorization is terminated  or revoked sooner.       Influenza A by PCR NEGATIVE NEGATIVE Final   Influenza B by PCR NEGATIVE NEGATIVE Final    Comment: (NOTE) The Xpert Xpress SARS-CoV-2/FLU/RSV plus assay is intended as an aid in the diagnosis of influenza from Nasopharyngeal swab specimens and should not be  used as a sole basis for treatment. Nasal washings and aspirates are unacceptable for Xpert Xpress SARS-CoV-2/FLU/RSV testing.  Fact Sheet for Patients: EntrepreneurPulse.com.au  Fact Sheet for Healthcare Providers: IncredibleEmployment.be  This test is not yet approved or cleared by the [place] FDA and has been authorized for detection and/or diagnosis of SARS-CoV-2 by FDA under an Emergency Use Authorization (EUA). This EUA will remain in effect (meaning this test can be used) for the duration of the COVID-19 declaration under Section 564(b)(1) of the Act, 21 U.S.C. section 360bbb-3(b)(1), unless the authorization is terminated or revoked.  Performed at [facility], [place] [address], [place], [place] [address]   Urine Culture     Status: Abnormal   Collection Time: 01/17/21  3:36 PM   Specimen: Urine, Clean Catch  Result Value Ref Range Status   Specimen Description URINE, CLEAN CATCH  Final   Special Requests   Final    NONE Performed at [facility], [place] [address] [place],  [address]    Culture (A)  Final    >=100,000 COLONIES/mL ESCHERICHIA COLI Confirmed Extended Spectrum Beta-Lactamase Producer (ESBL).  In bloodstream infections from ESBL organisms, carbapenems are preferred over piperacillin/tazobactam. They are shown to have a lower risk of mortality.    Report Status 01/20/2021 FINAL  Final   Organism ID, Bacteria ESCHERICHIA COLI (A)  Final      Susceptibility   Escherichia coli - MIC*    AMPICILLIN >=32 RESISTANT Resistant     CEFAZOLIN >=64 RESISTANT Resistant     CEFEPIME 16 RESISTANT Resistant     CEFTRIAXONE >=64 RESISTANT Resistant     CIPROFLOXACIN >=4 RESISTANT Resistant     GENTAMICIN <=1 SENSITIVE Sensitive     IMIPENEM <=0.25 SENSITIVE Sensitive     NITROFURANTOIN 128 RESISTANT Resistant     TRIMETH/SULFA >=320 RESISTANT Resistant     AMPICILLIN/SULBACTAM 8  SENSITIVE Sensitive      PIP/TAZO <=4 SENSITIVE Sensitive     * >=100,000 COLONIES/mL ESCHERICHIA COLI  MRSA Next Gen by PCR, Nasal     Status: None   Collection Time: 01/17/21  9:47 PM   Specimen: Nasal Mucosa; Nasal Swab  Result Value Ref Range Status   MRSA by PCR Next Gen NOT DETECTED NOT DETECTED Final    Comment: (NOTE) The GeneXpert MRSA Assay (FDA approved for NASAL specimens only), is one component of a comprehensive MRSA colonization surveillance program. It is not intended to diagnose MRSA infection nor to guide or monitor treatment for MRSA infections. Test performance is not FDA approved in patients less than 43 years old. Performed at [facility], [place] [address], [place], [place] [address]      Labs: BNP (last 3 results) No results for input(s): BNP in the last 8760 hours. Basic Metabolic Panel: Recent Labs  Lab 01/19/21 0656 01/20/21 0341 01/21/21 0629 01/22/21 0418 01/23/21 0145  NA 140 138 135 139 137  K 3.2* 2.8* 2.9* 4.0 3.6  CL 110 108 105 108 107  CO2 18* 20* 21* 21* 21*  GLUCOSE 119* 99 104* 155* 129*  BUN 35* 35* 32* 33* 39*  CREATININE 2.89* 2.57* 2.11* 2.01* 2.04*  CALCIUM 8.7* 8.6* 8.0* 8.3* 8.2*  MG  --  1.9 1.8 1.8  --   PHOS  --  2.2* 3.1 2.9  --    Liver Function Tests: Recent Labs  Lab 01/17/21 1539 01/19/21 0656  AST 25 29  ALT 11 12  ALKPHOS 72 66  BILITOT 0.7 0.8  PROT 7.0 5.8*  ALBUMIN 3.4* 2.5*   Recent Labs  Lab 01/17/21 1539  LIPASE 39   No results for input(s): AMMONIA in the last 168 hours. CBC: Recent Labs  Lab 01/17/21 1539 01/18/21 0500 01/19/21 0656 01/20/21 0341 01/21/21 0629 01/22/21 0418 01/23/21 0145  WBC 9.5   < > 20.3* 17.4* 10.9* 6.5 12.1*  NEUTROABS 6.1  --   --   --   --   --   --   HGB 12.8   < > 11.5* 11.6* 11.0* 11.2* 11.1*  HCT 39.8   < > 33.5* 34.0* 31.9* 34.0* 33.1*  MCV 91.1   < > 85.9 86.1 85.1 87.6 87.6  PLT 202   < > 186 196 204 211 212   < > = values in this interval not displayed.   Cardiac  Enzymes: No results for input(s): CKTOTAL, CKMB, CKMBINDEX, TROPONINI in the last 168 hours. BNP: Invalid input(s): POCBNP CBG: No results for input(s): GLUCAP in the last 168 hours. D-Dimer No results for input(s): DDIMER in the last 72 hours. Hgb A1c No results for input(s): HGBA1C in the last 72 hours. Lipid Profile No results for input(s): CHOL, HDL, LDLCALC, TRIG, CHOLHDL, LDLDIRECT in the last 72 hours. Thyroid function studies No results for input(s): TSH, T4TOTAL, T3FREE, THYROIDAB in the last 72 hours.  Invalid input(s): FREET3 Anemia work up No results for input(s): VITAMINB12, FOLATE, FERRITIN, TIBC, IRON, RETICCTPCT in the last 72 hours. Urinalysis    Component Value Date/Time   COLORURINE YELLOW 01/17/2021 1603   APPEARANCEUR TURBID (A) 01/17/2021 1603   LABSPEC 1.009 01/17/2021 1603   PHURINE 5.0 01/17/2021 1603   GLUCOSEU NEGATIVE 01/17/2021 1603   HGBUR MODERATE (A) 01/17/2021 1603   BILIRUBINUR NEGATIVE 01/17/2021 1603   KETONESUR 5 (A) 01/17/2021 1603   PROTEINUR 30 (A) 01/17/2021 1603   UROBILINOGEN 0.2  06/11/2013 2102   NITRITE POSITIVE (A) 01/17/2021 1603   LEUKOCYTESUR LARGE (A) 01/17/2021 1603   Sepsis Labs Invalid input(s): PROCALCITONIN,  WBC,  LACTICIDVEN Microbiology Recent Results (from the past 240 hour(s))  Resp Panel by RT-PCR (Flu A&B, Covid) Nasopharyngeal Swab     Status: None   Collection Time: 01/17/21  3:18 PM   Specimen: Nasopharyngeal Swab; Nasopharyngeal(NP) swabs in vial transport medium  Result Value Ref Range Status   SARS Coronavirus 2 by RT PCR NEGATIVE NEGATIVE Final    Comment: (NOTE) SARS-CoV-2 target nucleic acids are NOT DETECTED.  The SARS-CoV-2 RNA is generally detectable in upper respiratory specimens during the acute phase of infection. The lowest concentration of SARS-CoV-2 viral copies this assay can detect is 138 copies/mL. A negative result does not preclude SARS-Cov-2 infection and should not be used as the  sole basis for treatment or other patient management decisions. A negative result may occur with  improper specimen collection/handling, submission of specimen other than nasopharyngeal swab, presence of viral mutation(s) within the areas targeted by this assay, and inadequate number of viral copies(<138 copies/mL). A negative result must be combined with clinical observations, patient history, and epidemiological information. The expected result is Negative.  Fact Sheet for Patients:  EntrepreneurPulse.com.au  Fact Sheet for Healthcare Providers:  IncredibleEmployment.be  This test is no t yet approved or cleared by the [place] FDA and  has been authorized for detection and/or diagnosis of SARS-CoV-2 by FDA under an Emergency Use Authorization (EUA). This EUA will remain  in effect (meaning this test can be used) for the duration of the COVID-19 declaration under Section 564(b)(1) of the Act, 21 U.S.C.section 360bbb-3(b)(1), unless the authorization is terminated  or revoked sooner.       Influenza A by PCR NEGATIVE NEGATIVE Final   Influenza B by PCR NEGATIVE NEGATIVE Final    Comment: (NOTE) The Xpert Xpress SARS-CoV-2/FLU/RSV plus assay is intended as an aid in the diagnosis of influenza from Nasopharyngeal swab specimens and should not be used as a sole basis for treatment. Nasal washings and aspirates are unacceptable for Xpert Xpress SARS-CoV-2/FLU/RSV testing.  Fact Sheet for Patients: EntrepreneurPulse.com.au  Fact Sheet for Healthcare Providers: IncredibleEmployment.be  This test is not yet approved or cleared by the [place] FDA and has been authorized for detection and/or diagnosis of SARS-CoV-2 by FDA under an Emergency Use Authorization (EUA). This EUA will remain in effect (meaning this test can be used) for the duration of the COVID-19 declaration under Section 564(b)(1) of the  Act, 21 U.S.C. section 360bbb-3(b)(1), unless the authorization is terminated or revoked.  Performed at [facility], [place] [address] [place], [place] [address]   Urine Culture     Status: Abnormal   Collection Time: 01/17/21  3:36 PM   Specimen: Urine, Clean Catch  Result Value Ref Range Status   Specimen Description URINE, CLEAN CATCH  Final   Special Requests   Final    NONE Performed at [facility], [place] [address], [place], [place] [address]    Culture (A)  Final    >=100,000 COLONIES/mL ESCHERICHIA COLI Confirmed Extended Spectrum Beta-Lactamase Producer (ESBL).  In bloodstream infections from ESBL organisms, carbapenems are preferred over piperacillin/tazobactam. They are shown to have a lower risk of mortality.    Report Status 01/20/2021 FINAL  Final   Organism ID, Bacteria ESCHERICHIA COLI (A)  Final      Susceptibility   Escherichia coli - MIC*  AMPICILLIN >=32 RESISTANT Resistant     CEFAZOLIN >=64 RESISTANT Resistant     CEFEPIME 16 RESISTANT Resistant     CEFTRIAXONE >=64 RESISTANT Resistant     CIPROFLOXACIN >=4 RESISTANT Resistant     GENTAMICIN <=1 SENSITIVE Sensitive     IMIPENEM <=0.25 SENSITIVE Sensitive     NITROFURANTOIN 128 RESISTANT Resistant     TRIMETH/SULFA >=320 RESISTANT Resistant     AMPICILLIN/SULBACTAM 8 SENSITIVE Sensitive     PIP/TAZO <=4 SENSITIVE Sensitive     * >=100,000 COLONIES/mL ESCHERICHIA COLI  MRSA Next Gen by PCR, Nasal     Status: None   Collection Time: 01/17/21  9:47 PM   Specimen: Nasal Mucosa; Nasal Swab  Result Value Ref Range Status   MRSA by PCR Next Gen NOT DETECTED NOT DETECTED Final    Comment: (NOTE) The GeneXpert MRSA Assay (FDA approved for NASAL specimens only), is one component of a comprehensive MRSA colonization surveillance program. It is not intended to diagnose MRSA infection nor to guide or monitor treatment for MRSA infections. Test performance is not FDA approved in patients less than 8  years old. Performed at [facility], [place] [address], [place], [place] [address]      Time coordinating discharge: Over 30 minutes  SIGNED:   Hosie Poisson, MD  [facility] 01/23/2021, 12:45 PM Pager   If 7PM-7AM, please contact night-coverage [email or web address] Password [email or web address]

## 2021-01-23 NOTE — [place] FL2 (Signed)
[place] MEDICAID FL2 LEVEL OF CARE SCREENING TOOL     IDENTIFICATION  Patient Name: [name] [name] Birthdate: 11-21-1928 Sex: female Admission Date (Current Location): 01/17/2021  [place] and [place] Number:  Kathleen Argue [id number] [place] and Address:  [facility], [place] [address], [place], [place] [address]      Provider Number: [id number]  Attending Physician Name and Address:  Hosie Poisson, MD  Relative Name and Phone Number:  Lavella Hammock - sister, [phone]    Current Level of Care: Hospital Recommended Level of Care: [place] (Patient from [facility]) Prior Approval Number:    Date Approved/Denied:   PASRR Number: [id number]  Discharge Plan: SNF ([facility])    Current Diagnoses: Patient Active Problem List   Diagnosis Date Noted   Acute parotitis 01/17/2021   SDH (subdural hematoma) 06/05/2020   Recurrent UTI 03/11/2020   Exudative age-related macular degeneration of right eye with inactive choroidal neovascularization ([place]) 12/20/2019   Degenerative retinal drusen of both eyes 12/20/2019   Exudative age-related macular degeneration of left eye with inactive choroidal neovascularization ([place]) 12/20/2019   Traumatic subdural hematoma 08/08/2018   Pyelonephritis 09/09/2016   Falls frequently 06/24/2016   Tension headache 11/10/2013   Chest pain 11/03/2011   AKI (acute kidney injury) ([place]) 04/30/2011   UTI (urinary tract infection) 04/30/2011   CKD (chronic kidney disease), stage IV ([place]) 04/30/2011   Anemia 04/30/2011   S/p nephrectomy 04/30/2011   Macular degeneration 04/30/2011   Cataract 04/30/2011   Bladder cancer ([place]) 04/30/2011   Weakness generalized 04/29/2011   Seizure disorder ([place]) 04/29/2011   Benign hypertension 04/29/2011   CAD (coronary artery disease) 04/29/2011   Hyperlipidemia 04/29/2011   Anxiety 04/29/2011   Dizziness - light-headed 04/29/2011   Chronic abdominal pain  04/29/2011   Mixed hyperlipidemia 05/24/2008   Essential hypertension, benign 05/24/2008   Postsurgical aortocoronary bypass status 05/24/2008    Orientation RESPIRATION BLADDER Height & Weight     Self, Place  Normal External catheter (Double-lumen urethral catheter) Weight: 151 lb 3.8 oz (68.6 kg) Height:  5\' 2"  (157.5 cm)  BEHAVIORAL SYMPTOMS/MOOD NEUROLOGICAL BOWEL NUTRITION STATUS      Incontinent Diet (Dys 2 diet)  AMBULATORY STATUS COMMUNICATION OF NEEDS Skin   Total Care (Patient wheelchair bound at baseline) Verbally Normal                       Personal Care Assistance Level of Assistance  Bathing, Feeding, Dressing Bathing Assistance: Limited assistance Feeding assistance: Independent Dressing Assistance: Limited assistance     Functional Limitations Info  Sight, Hearing, Speech Sight Info: Impaired (Wears glasses) Hearing Info: Adequate Speech Info: Adequate    SPECIAL CARE FACTORS FREQUENCY  Speech therapy             Speech Therapy Frequency: Evaluatio 10/14. DYS 2 diet recommended      Contractures Contractures Info: Not present    Additional Factors Info  Code Status, Allergies Code Status Info: DNR Allergies Info: Adhesive (Tape), Latex, Macrobid (Nitrofurantoin), Penicillins, Sulfa Antibiotics           Current Medications (01/23/2021):  This is the current hospital active medication list Current Facility-Administered Medications  Medication Dose Route Frequency Provider Last Rate Last Admin   0.9 %  sodium chloride infusion   Intravenous Continuous Shawna Clamp, MD 50 mL/hr at 01/22/21 1548 New Bag at 01/22/21 1548   amLODipine (NORVASC) tablet 2.5 mg  2.5 mg Oral Daily Ouida Sills,  Chelsey L, MD   2.5 mg at 01/23/21 0943   cefTRIAXone (ROCEPHIN) 2 g in sodium chloride 0.9 % 100 mL IVPB  2 g Intravenous Q24H Shawna Clamp, MD 200 mL/hr at 01/22/21 2211 2 g at 01/22/21 2211   Chlorhexidine Gluconate Cloth 2 % PADS 6 each  6 each Topical  Daily Richarda Osmond, MD   6 each at 01/23/21 0946   heparin injection 5,000 Units  5,000 Units Subcutaneous Q8H Etta Quill, DO   5,000 Units at 01/23/21 4270   hydrALAZINE (APRESOLINE) injection 10 mg  10 mg Intravenous Q6H PRN Shawna Clamp, MD       HYDROmorphone (DILAUDID) injection 0.25-1 mg  0.25-1 mg Intravenous Q2H PRN Etta Quill, DO   1 mg at 01/22/21 2356   lactated ringers infusion   Intravenous Continuous Jennette Kettle M, DO 100 mL/hr at 01/21/21 2128 New Bag at 01/21/21 2128   levETIRAcetam (KEPPRA) tablet 250 mg  250 mg Oral BID Shawna Clamp, MD   250 mg at 01/23/21 0944   metroNIDAZOLE (FLAGYL) IVPB 500 mg  500 mg Intravenous Q12H Shawna Clamp, MD 100 mL/hr at 01/23/21 0945 500 mg at 01/23/21 0945   ondansetron (ZOFRAN) tablet 4 mg  4 mg Oral Q6H PRN Etta Quill, DO       Or   ondansetron [facility]) injection 4 mg  4 mg Intravenous Q6H PRN Etta Quill, DO         Discharge Medications: Please see discharge summary for a list of discharge medications.  Relevant Imaging Results:  Relevant Lab Results:   Additional Information ss# [id number]. Patient on contact precautions. Has had COVID vaccinations and Flu shot  Alvena Kiernan, Mila Homer, LCSW

## 2021-01-23 NOTE — Care Management Important Message (Signed)
Important Message  Patient Details  Name: [name] [name] MRN: [id number] Date of Birth: 28-May-1928   Medicare Important Message Given:  Yes     [name] [name] 01/23/2021, 11:27 AM

## 2021-01-23 NOTE — Progress Notes (Signed)
Speech Language Pathology Treatment: Dysphagia  Patient Details Name: [name] [name] MRN: [id number] DOB: 05-21-1928 Today's Date: 01/23/2021 Time: 1188-6773 SLP Time Calculation (min) (ACUTE ONLY): 18 min  Assessment / Plan / Recommendation Clinical Impression  Pt seen, alert and repositioned upright in bed, with am meal at bedside consisting of dysphagia 2 solids and thin liquids. Thin liquids via straw sips and true dysphagia 2 solids consumed without overt s/sx of aspiration and complete oral clearance of solids noted upon inspection. Dysphagia 3 textures overall tolerated, though with some mildly prolonged mastication and pt reports that she is unable to masticate chunks of meat and steamed vegetables without her dentures, which she states are broken. She stated that she prefers dysphagia 2 solids at this time. Given clinical presentation this date and reports from RN of pt tolerating diet, dysphagia 2/thin liquid diet to remain with adherence to universal swallow precautions. No further SLP f/u warranted at this time. Will s/o.    HPI HPI: Pt is a 87 y.o. female who presented to the ED on 01/17/2021 with right facial pain x 2 days and was found to have parotitis. Pt treated with IV fluids and antibiotics. Pt made NPO and SLP consulted to assess pt's ability to tolerate p.o. meds. CT maxxilofacial: There is infiltration of the fat surrounding the right  parotid. The inferior extent is not imaged. Right parotid is  enlarged with increased density. PMH: CKD 4, history of bladder cancer, HTN, seizure disorder, chronic back pain, UTIs, chronic Foley, wheelchair-bound at baseline.      SLP Plan  Discharge SLP treatment due to (comment);All goals met      Recommendations for follow up therapy are one component of a multi-disciplinary discharge planning process, led by the attending physician.  Recommendations may be updated based on patient status, additional functional criteria and insurance  authorization.    Recommendations  Diet recommendations: Dysphagia 2 (fine chop);Thin liquid Liquids provided via: Straw;Cup Medication Administration: Whole meds with liquid Supervision: Staff to assist with self feeding;Full supervision/cueing for compensatory strategies Compensations: Minimize environmental distractions;Slow rate;Small sips/bites Postural Changes and/or Swallow Maneuvers: Seated upright 90 degrees                Oral Care Recommendations: Oral care BID Follow up Recommendations: None SLP Visit Diagnosis: Dysphagia, unspecified (R13.10) Plan: Discharge SLP treatment due to (comment);All goals met       [place], [place], [place] Office Number: [phone] ]  Acie Fredrickson  01/23/2021, 9:44 AM

## 2021-01-24 DIAGNOSIS — K1121 Acute sialoadenitis: Secondary | ICD-10-CM | POA: Diagnosis not present

## 2021-01-24 DIAGNOSIS — I10 Essential (primary) hypertension: Secondary | ICD-10-CM | POA: Diagnosis not present

## 2021-01-24 DIAGNOSIS — N179 Acute kidney failure, unspecified: Secondary | ICD-10-CM | POA: Diagnosis not present

## 2021-01-24 MED ORDER — LEVOFLOXACIN 500 MG PO TABS: 500.0000 mg | ORAL_TABLET | Freq: Every day | ORAL | 0 refills | Status: AC

## 2021-01-24 MED ORDER — METRONIDAZOLE 500 MG PO TABS: 500.0000 mg | ORAL_TABLET | Freq: Two times a day (BID) | ORAL | 0 refills | Status: AC

## 2021-01-24 MED ORDER — AMLODIPINE BESYLATE 5 MG PO TABS
5.0000 mg | ORAL_TABLET | Freq: Every day | ORAL | Status: DC
  Administered 2021-01-24: 5 mg via ORAL

## 2021-01-24 MED ORDER — AMLODIPINE BESYLATE 5 MG PO TABS: 5.0000 mg | ORAL_TABLET | Freq: Every day | ORAL | 0 refills | Status: AC

## 2021-01-24 NOTE — Plan of Care (Signed)
  Problem: SLP Dysphagia Goals Goal: Patient will utilize recommended strategies Description: Patient will utilize recommended strategies during swallow to increase swallowing safety with Outcome: Adequate for Discharge   Problem: Education: Goal: Knowledge of General Education information will improve Description: Including pain rating scale, medication(s)/side effects and non-pharmacologic comfort measures 01/24/2021 1130 by Dolores Hoose, RN Outcome: Adequate for Discharge 01/24/2021 0747 by Dolores Hoose, RN Outcome: Progressing   Problem: Health Behavior/Discharge Planning: Goal: Ability to manage health-related needs will improve Outcome: Adequate for Discharge   Problem: Clinical Measurements: Goal: Ability to maintain clinical measurements within normal limits will improve Outcome: Adequate for Discharge Goal: Will remain free from infection 01/24/2021 1130 by Dolores Hoose, RN Outcome: Adequate for Discharge 01/24/2021 0747 by Dolores Hoose, RN Outcome: Progressing Goal: Diagnostic test results will improve Outcome: Adequate for Discharge Goal: Respiratory complications will improve Outcome: Adequate for Discharge Goal: Cardiovascular complication will be avoided Outcome: Adequate for Discharge   Problem: Activity: Goal: Risk for activity intolerance will decrease Outcome: Adequate for Discharge   Problem: Nutrition: Goal: Adequate nutrition will be maintained 01/24/2021 1130 by Dolores Hoose, RN Outcome: Adequate for Discharge 01/24/2021 0747 by Dolores Hoose, RN Outcome: Progressing   Problem: Coping: Goal: Level of anxiety will decrease Outcome: Adequate for Discharge   Problem: Elimination: Goal: Will not experience complications related to bowel motility Outcome: Adequate for Discharge Goal: Will not experience complications related to urinary retention Outcome: Adequate for Discharge   Problem: Pain Managment: Goal: General  experience of comfort will improve 01/24/2021 1130 by Dolores Hoose, RN Outcome: Adequate for Discharge 01/24/2021 0747 by Dolores Hoose, RN Outcome: Progressing   Problem: Safety: Goal: Ability to remain free from injury will improve Outcome: Adequate for Discharge   Problem: Skin Integrity: Goal: Risk for impaired skin integrity will decrease Outcome: Adequate for Discharge

## 2021-01-24 NOTE — Discharge Summary (Addendum)
Physician Discharge Summary  [name] [name] ZLD:[id number] DOB: 10-18-1928 DOA: 01/17/2021  PCP: Celene Squibb, MD  Admit date: 01/17/2021 Discharge date: 01/24/2021  Admitted From:  Home Disposition:  SNF  Recommendations for Outpatient Follow-up:  Follow up with PCP in 1-2 weeks Please obtain BMP/CBC in one week Please follow up with palliative care on discharge at SNF.    Discharge Condition:stable.  CODE STATUS:DNR Diet recommendation: Heart Healthy / Carb Modified   Brief/Interim Summary:This 87 years old female with PMH significant for CKD stage IV, history of bladder cancer, hypertension, seizure disorder, chronic back pain, UTIs, chronic Foley catheter, wheelchair-bound at baseline presented from the nursing home with complaints of right facial pain for 2 days.  In the ED it was found out that she has parotitis.  Patient was treated with IV fluids and antibiotics and admitted for further work-up and management.  Discharge Diagnoses:  Principal Problem:   Acute parotitis Active Problems:   Seizure disorder ([place])   Benign hypertension   AKI (acute kidney injury) ([place])   CKD (chronic kidney disease), stage IV ([facility])   Bladder cancer ([facility])   Recurrent UTI    Parotitis / Right mandibular swelling: Patient presented with right parotid gland swelling with erythema. Patient started on meropenem due to poor clinical picture and multiple allergies. Patient is clinically stable without respiratory or oropharynx compression. Will transition to oral antibiotics on discharge. Continue adequate pain control. Speech and swallow evaluation recommended dysphagia 2 diet. Discussed with ENT recommended Decadron 8 mg x 3 doses. There is significant improvement in pain and swelling.   AKI on CKD stage IV: Patient does have chronic indwelling Foley catheter but is asymptomatic for UTI. Patient had appointment for Foley exchange on 10/18. Renal ultrasound consistent with right nephrectomy,  left renal cortical thinning and increased cortical echotexture consistent with medical renal disease. Creatinine improving 3.92> 3.29 >2.89>2.57 >2.11>2.01 Urine culture grew ESBL pan resistant, Patient denies any urinary symptoms, this could be colonization.  Seizure disorder: Continue home Keppra.   Essential hypertension: Slightly elevated BP, increase amlodipine to 5 mg daily.    Bladder cancer: Follow-up outpatient with palliative care.   Depression: Patient takes Zoloft,  Patient also on several medications that can cause CNS depression including gabapentin, diphenhydramine,  clonazepam, oxybutynin, tizanidine.  will discontinue all at this point.   Hypokalemia: Resolved.   Hypophosphatemia: Resolved  Leukocytosis probably from steroids.    Discharge Instructions   Allergies as of 01/24/2021       Reactions   Adhesive [tape] Other (See Comments)   Tears patients skin off.    Latex Itching   Macrobid [nitrofurantoin] Other (See Comments)   unknown   Penicillins Hives, Itching   Sulfa Antibiotics Hives, Itching        Medication List     TAKE these medications    acetaminophen 325 MG tablet Commonly known as: TYLENOL Take 325 mg by mouth every 6 (six) hours as needed for mild pain.   amLODipine 5 MG tablet Commonly known as: NORVASC Take 1 tablet (5 mg total) by mouth daily. What changed:  medication strength how much to take   Benefiber Powd Take 1 Scoop by mouth daily at 6 (six) AM.   Cranberry 450 MG Tabs Take 450 mg by mouth daily.   Eyescrub Pads Apply 1 each topically daily.   HYDROcodone-acetaminophen 5-325 MG tablet Commonly known as: NORCO/VICODIN Take 1 tablet by mouth in the morning and at bedtime for 3 days.  levETIRAcetam 250 MG tablet Commonly known as: KEPPRA Take 1 tablet (250 mg total) by mouth 2 (two) times daily.   levofloxacin 500 MG tablet Commonly known as: Levaquin Take 1 tablet (500 mg total) by mouth daily for  1 day. Start taking on: January 25, 2021   linaclotide 290 MCG Caps capsule Commonly known as: LINZESS Take 290 mcg by mouth daily.   loperamide 2 MG capsule Commonly known as: IMODIUM Take 2 mg by mouth every 4 (four) hours as needed for diarrhea or loose stools.   melatonin 3 MG Tabs tablet Take 3 mg by mouth at bedtime.   metroNIDAZOLE 500 MG tablet Commonly known as: Flagyl Take 1 tablet (500 mg total) by mouth 2 (two) times daily for 1 day. Start taking on: January 25, 2021   NONFORMULARY OR COMPOUNDED ITEM Apply 1 application topically in the morning and at bedtime. Anti -itch lotion 0.5-0.5% ( Mix with triamcinolone)   oxybutynin 10 MG 24 hr tablet Commonly known as: DITROPAN-XL Take 10 mg by mouth daily.   phenol 1.4 % Liqd Commonly known as: CHLORASEPTIC Use as directed 1 spray in the mouth or throat every 4 (four) hours as needed for throat irritation / pain.   PRESERVISION AREDS PO Take 1 capsule by mouth daily.   rosuvastatin 10 MG tablet Commonly known as: CRESTOR Take 10 mg by mouth daily.   sertraline 25 MG tablet Commonly known as: ZOLOFT Take 25 mg by mouth daily.   triamcinolone cream 0.1 % Commonly known as: KENALOG Apply 1 application topically 2 (two) times daily. Mix with Sarna lotion        Allergies  Allergen Reactions   Adhesive [Tape] Other (See Comments)    Tears patients skin off.    Latex Itching   Macrobid [Nitrofurantoin] Other (See Comments)    unknown   Penicillins Hives and Itching   Sulfa Antibiotics Hives and Itching    Consultations: ENT   Procedures/Studies: CT HEAD WO CONTRAST (5MM)  Result Date: 01/17/2021 CLINICAL DATA:  Nystagmus or other irregular eye movement right sided pain and weakness; maxillofacial pain eval parotid/submandibular swelling EXAM: CT HEAD WITHOUT CONTRAST CT MAXILLOFACIAL WITHOUT CONTRAST TECHNIQUE: Multidetector CT imaging of the head and maxillofacial structures were performed using  the standard protocol without intravenous contrast. Multiplanar CT image reconstructions of the maxillofacial structures were also generated. COMPARISON:  06/06/2020 CT head FINDINGS: CT HEAD FINDINGS Brain: There is no acute intracranial hemorrhage, mass effect, or edema. Gray-white differentiation is preserved. The extra-axial collection along the left cerebral convexity has resolved. Falcine subdural hematoma is also expectedly resolved. Prominence of the ventricles and sulci reflects stable parenchymal volume loss. Patchy low-attenuation in the supratentorial white matter probably reflects stable chronic microvascular ischemic changes. Small chronic infarcts of the right corona radiata and basal ganglia as seen previously. Vascular: There is intracranial atherosclerotic calcification at the skull base. Skull: Unremarkable apart from right parietal craniotomy. Other: Mastoid air cells are clear. CT MAXILLOFACIAL FINDINGS Osseous: No acute abnormality. Orbits: Bilateral lens replacements.  Otherwise unremarkable. Sinuses: No significant opacification. Soft tissues: There is infiltration of the fat surrounding the right parotid. The inferior extent is not imaged. Right parotid is enlarged with increased density. No stones within the gland or duct. IMPRESSION: No acute intracranial abnormality. Subdural collections have resolved. Stable chronic/nonemergent findings detailed above. Findings most consistent with right parotitis. Electronically Signed   By: Macy Mis M.D.   On: 01/17/2021 16:43   CT Cervical Spine Wo Contrast  Result Date: 01/17/2021 CLINICAL DATA:  Cervical radiculopathy, infection suspected EXAM: CT CERVICAL SPINE WITHOUT CONTRAST TECHNIQUE: Multidetector CT imaging of the cervical spine was performed without intravenous contrast. Multiplanar CT image reconstructions were also generated. COMPARISON:  June 05, 2020 FINDINGS: Alignment: Stable. Skull base and vertebrae: Stable vertebral body  heights. Partial fusion across the C2-C3 disc space is again noted. As before, there is fusion of the C2-C3 facets. There is no destructive osseous lesion. Soft tissues and spinal canal: No prevertebral fluid or swelling. No visible canal hematoma. Disc levels: Multilevel degenerative changes are present including disc space narrowing, endplate osteophytes, and facet and uncovertebral hypertrophy. These findings are not substantially changed from prior study. There is no high-grade osseous encroachment on the spinal canal. Foraminal narrowing is greatest on the left at C3-C4 and bilaterally at C5-C6. Upper chest: No apical lung mass. Other: Right periparotid inflammatory changes extend to the level of the hyoid. IMPRESSION: No acute abnormality or significant change since prior examination. Multilevel degenerative changes are stable. Electronically Signed   By: Macy Mis M.D.   On: 01/17/2021 16:50   US RENAL  Result Date: 01/17/2021 CLINICAL DATA:  Acute renal insufficiency EXAM: RENAL / URINARY TRACT ULTRASOUND COMPLETE COMPARISON:  01/17/2021 FINDINGS: Right Kidney: Surgically absent. Left Kidney: Renal measurements: 8.3 x 4.8 x 4.6 cm = volume: 96 mL. Renal cortical thinning, with increased renal cortical echotexture consistent with medical renal disease. No hydronephrosis or nephrolithiasis. Bladder: Foley catheter decompresses the bladder.  Evaluation is limited. Other: None. IMPRESSION: 1. Status post right nephrectomy. 2. Left renal cortical thinning and increased cortical echotexture consistent with medical renal disease. Electronically Signed   By: Randa Ngo M.D.   On: 01/17/2021 21:34   CT CHEST ABDOMEN PELVIS WO CONTRAST  Result Date: 01/17/2021 CLINICAL DATA:  Nonlocalized acute abdominal pain. EXAM: CT CHEST, ABDOMEN AND PELVIS WITHOUT CONTRAST TECHNIQUE: Multidetector CT imaging of the chest, abdomen and pelvis was performed following the standard protocol without IV contrast.  COMPARISON:  CT abdomen pelvis 04/25/2020 FINDINGS: CT CHEST FINDINGS Cardiovascular: Normal heart size. No significant pericardial effusion. The thoracic aorta is normal in caliber. Moderate to severe atherosclerotic plaque of the thoracic aorta. Four-vessel coronary artery calcifications status post coronary artery bypass graft. Mediastinum/Nodes: No gross hilar adenopathy, noting limited sensitivity for the detection of hilar adenopathy on this noncontrast study. No enlarged mediastinal or axillary lymph nodes. Thyroid gland, trachea, and esophagus demonstrate no significant findings. Lungs/Pleura: Scattered punctate calcifications within the lungs likely sequelae of prior granulomatous disease. No pulmonary mass. Focal consolidation. Trace left pleural effusion. Associated left lower lobe passive atelectasis. No right pleural effusion. No pneumothorax. Musculoskeletal: No chest wall abnormality. No suspicious lytic or blastic osseous lesions. No acute displaced fracture. Sternotomy wires appear intact. Severe degenerative changes of the left shoulder. CT ABDOMEN PELVIS FINDINGS Hepatobiliary: No focal liver abnormality. Nonspecific slightly hydropic gallbladder. No gallstones, gallbladder wall thickening, or pericholecystic fluid. No biliary dilatation. Pancreas: No focal lesion. Normal pancreatic contour. No surrounding inflammatory changes. No main pancreatic ductal dilatation. Spleen: Normal in size without focal abnormality. Adrenals/Urinary Tract: No adrenal nodule bilaterally. Status post right nephrectomy. Left renal cortical scarring. No nephrolithiasis and no hydronephrosis. Urothelial thickening of the left proximal ureter. No ureterolithiasis or hydroureter. Urinary bladder wall thickening and perivesicular fat stranding. Foley catheter terminates within the urinary bladder lumen. Stomach/Bowel: Stomach is within normal limits. Question incidentally noted partial small bowel malrotation. No evidence  of bowel wall thickening or dilatation. Colonic diverticulosis. The appendix  not definitely identified. Vascular/Lymphatic: No abdominal aorta or iliac aneurysm. Severe atherosclerotic plaque of the aorta and its branches. No abdominal, pelvic, or inguinal lymphadenopathy. Reproductive: Status post hysterectomy. No adnexal masses. Other: No intraperitoneal free fluid. No intraperitoneal free gas. No organized fluid collection. Musculoskeletal: No abdominal wall hernia or abnormality. No suspicious lytic or blastic osseous lesions. No acute displaced fracture. Severe degenerative changes of the lumbar spine. Kyphoplasty of the L1 level. Dextroscoliosis of the spine centered at the L1-L2 level. IMPRESSION: 1. Please note limited evaluation on this noncontrast study. 2. Trace left pleural effusion. 3. Urothelial thickening of the left proximal ureter as well as urinary bladder wall thickening and perivesicular fat stranding consistent with infection. Correlate with urinalysis. 4. Scattered colonic diverticulosis with no acute diverticulitis. 5.  Aortic Atherosclerosis (ICD10-I70.0). 6. Status post right nephrectomy. Electronically Signed   By: Iven Finn M.D.   On: 01/17/2021 16:44   CT Maxillofacial Wo Contrast  Result Date: 01/17/2021 CLINICAL DATA:  Nystagmus or other irregular eye movement right sided pain and weakness; maxillofacial pain eval parotid/submandibular swelling EXAM: CT HEAD WITHOUT CONTRAST CT MAXILLOFACIAL WITHOUT CONTRAST TECHNIQUE: Multidetector CT imaging of the head and maxillofacial structures were performed using the standard protocol without intravenous contrast. Multiplanar CT image reconstructions of the maxillofacial structures were also generated. COMPARISON:  06/06/2020 CT head FINDINGS: CT HEAD FINDINGS Brain: There is no acute intracranial hemorrhage, mass effect, or edema. Gray-white differentiation is preserved. The extra-axial collection along the left cerebral convexity  has resolved. Falcine subdural hematoma is also expectedly resolved. Prominence of the ventricles and sulci reflects stable parenchymal volume loss. Patchy low-attenuation in the supratentorial white matter probably reflects stable chronic microvascular ischemic changes. Small chronic infarcts of the right corona radiata and basal ganglia as seen previously. Vascular: There is intracranial atherosclerotic calcification at the skull base. Skull: Unremarkable apart from right parietal craniotomy. Other: Mastoid air cells are clear. CT MAXILLOFACIAL FINDINGS Osseous: No acute abnormality. Orbits: Bilateral lens replacements.  Otherwise unremarkable. Sinuses: No significant opacification. Soft tissues: There is infiltration of the fat surrounding the right parotid. The inferior extent is not imaged. Right parotid is enlarged with increased density. No stones within the gland or duct. IMPRESSION: No acute intracranial abnormality. Subdural collections have resolved. Stable chronic/nonemergent findings detailed above. Findings most consistent with right parotitis. Electronically Signed   By: Macy Mis M.D.   On: 01/17/2021 16:43      Subjective: NO NEW COMPLAINTS.   Discharge Exam: Vitals:   01/23/21 2049 01/24/21 0501  BP: (!) 184/69 (!) 165/60  Pulse: (!) 57 (!) 57  Resp: 18 16  Temp: 98 F (36.7 C) 98.4 F (36.9 C)  SpO2: 96% 94%   Vitals:   01/22/21 2140 01/23/21 0946 01/23/21 2049 01/24/21 0501  BP: (!) 158/60 (!) 160/69 (!) 184/69 (!) 165/60  Pulse: 66 61 (!) 57 (!) 57  Resp: 18 16 18 16   Temp: 98.4 F (36.9 C) 98 F (36.7 C) 98 F (36.7 C) 98.4 F (36.9 C)  TempSrc:  Oral Oral   SpO2: 95% 97% 96% 94%  Weight:      Height:        General: Pt is alert, awake, not in acute distress Cardiovascular: RRR, S1/S2 +, no rubs, no gallops Respiratory: CTA bilaterally, no wheezing, no rhonchi Abdominal: Soft, NT, ND, bowel sounds + Extremities: no edema, no cyanosis    The results  of significant diagnostics from this hospitalization (including imaging, microbiology, ancillary and laboratory) are listed  below for reference.     Microbiology: Recent Results (from the past 240 hour(s))  Resp Panel by RT-PCR (Flu A&B, Covid) Nasopharyngeal Swab     Status: None   Collection Time: 01/17/21  3:18 PM   Specimen: Nasopharyngeal Swab; Nasopharyngeal(NP) swabs in vial transport medium  Result Value Ref Range Status   SARS Coronavirus 2 by RT PCR NEGATIVE NEGATIVE Final    Comment: (NOTE) SARS-CoV-2 target nucleic acids are NOT DETECTED.  The SARS-CoV-2 RNA is generally detectable in upper respiratory specimens during the acute phase of infection. The lowest concentration of SARS-CoV-2 viral copies this assay can detect is 138 copies/mL. A negative result does not preclude SARS-Cov-2 infection and should not be used as the sole basis for treatment or other patient management decisions. A negative result may occur with  improper specimen collection/handling, submission of specimen other than nasopharyngeal swab, presence of viral mutation(s) within the areas targeted by this assay, and inadequate number of viral copies(<138 copies/mL). A negative result must be combined with clinical observations, patient history, and epidemiological information. The expected result is Negative.  Fact Sheet for Patients:  EntrepreneurPulse.com.au  Fact Sheet for Healthcare Providers:  IncredibleEmployment.be  This test is no t yet approved or cleared by the [place] FDA and  has been authorized for detection and/or diagnosis of SARS-CoV-2 by FDA under an Emergency Use Authorization (EUA). This EUA will remain  in effect (meaning this test can be used) for the duration of the COVID-19 declaration under Section 564(b)(1) of the Act, 21 U.S.C.section 360bbb-3(b)(1), unless the authorization is terminated  or revoked sooner.       Influenza A  by PCR NEGATIVE NEGATIVE Final   Influenza B by PCR NEGATIVE NEGATIVE Final    Comment: (NOTE) The Xpert Xpress SARS-CoV-2/FLU/RSV plus assay is intended as an aid in the diagnosis of influenza from Nasopharyngeal swab specimens and should not be used as a sole basis for treatment. Nasal washings and aspirates are unacceptable for Xpert Xpress SARS-CoV-2/FLU/RSV testing.  Fact Sheet for Patients: EntrepreneurPulse.com.au  Fact Sheet for Healthcare Providers: IncredibleEmployment.be  This test is not yet approved or cleared by the [place] FDA and has been authorized for detection and/or diagnosis of SARS-CoV-2 by FDA under an Emergency Use Authorization (EUA). This EUA will remain in effect (meaning this test can be used) for the duration of the COVID-19 declaration under Section 564(b)(1) of the Act, 21 U.S.C. section 360bbb-3(b)(1), unless the authorization is terminated or revoked.  Performed at [facility], [place] [address], [place], [place] [address]   Urine Culture     Status: Abnormal   Collection Time: 01/17/21  3:36 PM   Specimen: Urine, Clean Catch  Result Value Ref Range Status   Specimen Description URINE, CLEAN CATCH  Final   Special Requests   Final    NONE Performed at [facility], [place] [address] [place], [place] [address]    Culture (A)  Final    >=100,000 COLONIES/mL ESCHERICHIA COLI Confirmed Extended Spectrum Beta-Lactamase Producer (ESBL).  In bloodstream infections from ESBL organisms, carbapenems are preferred over piperacillin/tazobactam. They are shown to have a lower risk of mortality.    Report Status 01/20/2021 FINAL  Final   Organism ID, Bacteria ESCHERICHIA COLI (A)  Final      Susceptibility   Escherichia coli - MIC*    AMPICILLIN >=32 RESISTANT Resistant     CEFAZOLIN >=64 RESISTANT Resistant     CEFEPIME 16 RESISTANT Resistant  CEFTRIAXONE >=64 RESISTANT Resistant     CIPROFLOXACIN  >=4 RESISTANT Resistant     GENTAMICIN <=1 SENSITIVE Sensitive     IMIPENEM <=0.25 SENSITIVE Sensitive     NITROFURANTOIN 128 RESISTANT Resistant     TRIMETH/SULFA >=320 RESISTANT Resistant     AMPICILLIN/SULBACTAM 8 SENSITIVE Sensitive     PIP/TAZO <=4 SENSITIVE Sensitive     * >=100,000 COLONIES/mL ESCHERICHIA COLI  MRSA Next Gen by PCR, Nasal     Status: None   Collection Time: 01/17/21  9:47 PM   Specimen: Nasal Mucosa; Nasal Swab  Result Value Ref Range Status   MRSA by PCR Next Gen NOT DETECTED NOT DETECTED Final    Comment: (NOTE) The GeneXpert MRSA Assay (FDA approved for NASAL specimens only), is one component of a comprehensive MRSA colonization surveillance program. It is not intended to diagnose MRSA infection nor to guide or monitor treatment for MRSA infections. Test performance is not FDA approved in patients less than 28 years old. Performed at [facility], [place] [address], [place], [place] [address]   Resp Panel by RT-PCR (Flu A&B, Covid) Nasopharyngeal Swab     Status: None   Collection Time: 01/23/21  2:40 PM   Specimen: Nasopharyngeal Swab; Nasopharyngeal(NP) swabs in vial transport medium  Result Value Ref Range Status   SARS Coronavirus 2 by RT PCR NEGATIVE NEGATIVE Final    Comment: (NOTE) SARS-CoV-2 target nucleic acids are NOT DETECTED.  The SARS-CoV-2 RNA is generally detectable in upper respiratory specimens during the acute phase of infection. The lowest concentration of SARS-CoV-2 viral copies this assay can detect is 138 copies/mL. A negative result does not preclude SARS-Cov-2 infection and should not be used as the sole basis for treatment or other patient management decisions. A negative result may occur with  improper specimen collection/handling, submission of specimen other than nasopharyngeal swab, presence of viral mutation(s) within the areas targeted by this assay, and inadequate number of viral copies(<138 copies/mL). A negative  result must be combined with clinical observations, patient history, and epidemiological information. The expected result is Negative.  Fact Sheet for Patients:  EntrepreneurPulse.com.au  Fact Sheet for Healthcare Providers:  IncredibleEmployment.be  This test is no t yet approved or cleared by the [place] FDA and  has been authorized for detection and/or diagnosis of SARS-CoV-2 by FDA under an Emergency Use Authorization (EUA). This EUA will remain  in effect (meaning this test can be used) for the duration of the COVID-19 declaration under Section 564(b)(1) of the Act, 21 U.S.C.section 360bbb-3(b)(1), unless the authorization is terminated  or revoked sooner.       Influenza A by PCR NEGATIVE NEGATIVE Final   Influenza B by PCR NEGATIVE NEGATIVE Final    Comment: (NOTE) The Xpert Xpress SARS-CoV-2/FLU/RSV plus assay is intended as an aid in the diagnosis of influenza from Nasopharyngeal swab specimens and should not be used as a sole basis for treatment. Nasal washings and aspirates are unacceptable for Xpert Xpress SARS-CoV-2/FLU/RSV testing.  Fact Sheet for Patients: EntrepreneurPulse.com.au  Fact Sheet for Healthcare Providers: IncredibleEmployment.be  This test is not yet approved or cleared by the [place] FDA and has been authorized for detection and/or diagnosis of SARS-CoV-2 by FDA under an Emergency Use Authorization (EUA). This EUA will remain in effect (meaning this test can be used) for the duration of the COVID-19 declaration under Section 564(b)(1) of the Act, 21 U.S.C. section 360bbb-3(b)(1), unless the authorization is terminated or revoked.  Performed at [facility]  [facility], [place] [address], [place], [place] [address]      Labs: BNP (last 3 results) No results for input(s): BNP in the last 8760 hours. Basic Metabolic Panel: Recent Labs  Lab 01/19/21 0656  01/20/21 0341 01/21/21 0629 01/22/21 0418 01/23/21 0145  NA 140 138 135 139 137  K 3.2* 2.8* 2.9* 4.0 3.6  CL 110 108 105 108 107  CO2 18* 20* 21* 21* 21*  GLUCOSE 119* 99 104* 155* 129*  BUN 35* 35* 32* 33* 39*  CREATININE 2.89* 2.57* 2.11* 2.01* 2.04*  CALCIUM 8.7* 8.6* 8.0* 8.3* 8.2*  MG  --  1.9 1.8 1.8  --   PHOS  --  2.2* 3.1 2.9  --    Liver Function Tests: Recent Labs  Lab 01/17/21 1539 01/19/21 0656  AST 25 29  ALT 11 12  ALKPHOS 72 66  BILITOT 0.7 0.8  PROT 7.0 5.8*  ALBUMIN 3.4* 2.5*   Recent Labs  Lab 01/17/21 1539  LIPASE 39   No results for input(s): AMMONIA in the last 168 hours. CBC: Recent Labs  Lab 01/17/21 1539 01/18/21 0500 01/19/21 0656 01/20/21 0341 01/21/21 0629 01/22/21 0418 01/23/21 0145  WBC 9.5   < > 20.3* 17.4* 10.9* 6.5 12.1*  NEUTROABS 6.1  --   --   --   --   --   --   HGB 12.8   < > 11.5* 11.6* 11.0* 11.2* 11.1*  HCT 39.8   < > 33.5* 34.0* 31.9* 34.0* 33.1*  MCV 91.1   < > 85.9 86.1 85.1 87.6 87.6  PLT 202   < > 186 196 204 211 212   < > = values in this interval not displayed.   Cardiac Enzymes: No results for input(s): CKTOTAL, CKMB, CKMBINDEX, TROPONINI in the last 168 hours. BNP: Invalid input(s): POCBNP CBG: No results for input(s): GLUCAP in the last 168 hours. D-Dimer No results for input(s): DDIMER in the last 72 hours. Hgb A1c No results for input(s): HGBA1C in the last 72 hours. Lipid Profile No results for input(s): CHOL, HDL, LDLCALC, TRIG, CHOLHDL, LDLDIRECT in the last 72 hours. Thyroid function studies No results for input(s): TSH, T4TOTAL, T3FREE, THYROIDAB in the last 72 hours.  Invalid input(s): FREET3 Anemia work up No results for input(s): VITAMINB12, FOLATE, FERRITIN, TIBC, IRON, RETICCTPCT in the last 72 hours. Urinalysis    Component Value Date/Time   COLORURINE YELLOW 01/17/2021 1603   APPEARANCEUR TURBID (A) 01/17/2021 1603   LABSPEC 1.009 01/17/2021 1603   PHURINE 5.0 01/17/2021 1603    GLUCOSEU NEGATIVE 01/17/2021 1603   HGBUR MODERATE (A) 01/17/2021 1603   BILIRUBINUR NEGATIVE 01/17/2021 1603   KETONESUR 5 (A) 01/17/2021 1603   PROTEINUR 30 (A) 01/17/2021 1603   UROBILINOGEN 0.2 06/11/2013 2102   NITRITE POSITIVE (A) 01/17/2021 1603   LEUKOCYTESUR LARGE (A) 01/17/2021 1603   Sepsis Labs Invalid input(s): PROCALCITONIN,  WBC,  LACTICIDVEN Microbiology Recent Results (from the past 240 hour(s))  Resp Panel by RT-PCR (Flu A&B, Covid) Nasopharyngeal Swab     Status: None   Collection Time: 01/17/21  3:18 PM   Specimen: Nasopharyngeal Swab; Nasopharyngeal(NP) swabs in vial transport medium  Result Value Ref Range Status   SARS Coronavirus 2 by RT PCR NEGATIVE NEGATIVE Final    Comment: (NOTE) SARS-CoV-2 target nucleic acids are NOT DETECTED.  The SARS-CoV-2 RNA is generally detectable in upper respiratory specimens during the acute phase of infection. The lowest concentration of SARS-CoV-2 viral copies  this assay can detect is 138 copies/mL. A negative result does not preclude SARS-Cov-2 infection and should not be used as the sole basis for treatment or other patient management decisions. A negative result may occur with  improper specimen collection/handling, submission of specimen other than nasopharyngeal swab, presence of viral mutation(s) within the areas targeted by this assay, and inadequate number of viral copies(<138 copies/mL). A negative result must be combined with clinical observations, patient history, and epidemiological information. The expected result is Negative.  Fact Sheet for Patients:  EntrepreneurPulse.com.au  Fact Sheet for Healthcare Providers:  IncredibleEmployment.be  This test is no t yet approved or cleared by the [place] FDA and  has been authorized for detection and/or diagnosis of SARS-CoV-2 by FDA under an Emergency Use Authorization (EUA). This EUA will remain  in effect (meaning  this test can be used) for the duration of the COVID-19 declaration under Section 564(b)(1) of the Act, 21 U.S.C.section 360bbb-3(b)(1), unless the authorization is terminated  or revoked sooner.       Influenza A by PCR NEGATIVE NEGATIVE Final   Influenza B by PCR NEGATIVE NEGATIVE Final    Comment: (NOTE) The Xpert Xpress SARS-CoV-2/FLU/RSV plus assay is intended as an aid in the diagnosis of influenza from Nasopharyngeal swab specimens and should not be used as a sole basis for treatment. Nasal washings and aspirates are unacceptable for Xpert Xpress SARS-CoV-2/FLU/RSV testing.  Fact Sheet for Patients: EntrepreneurPulse.com.au  Fact Sheet for Healthcare Providers: IncredibleEmployment.be  This test is not yet approved or cleared by the [place] FDA and has been authorized for detection and/or diagnosis of SARS-CoV-2 by FDA under an Emergency Use Authorization (EUA). This EUA will remain in effect (meaning this test can be used) for the duration of the COVID-19 declaration under Section 564(b)(1) of the Act, 21 U.S.C. section 360bbb-3(b)(1), unless the authorization is terminated or revoked.  Performed at [facility], [place] [address], [place], [place] [address]   Urine Culture     Status: Abnormal   Collection Time: 01/17/21  3:36 PM   Specimen: Urine, Clean Catch  Result Value Ref Range Status   Specimen Description URINE, CLEAN CATCH  Final   Special Requests   Final    NONE Performed at [facility], [place] [address], [place], [place] [address]    Culture (A)  Final    >=100,000 COLONIES/mL ESCHERICHIA COLI Confirmed Extended Spectrum Beta-Lactamase Producer (ESBL).  In bloodstream infections from ESBL organisms, carbapenems are preferred over piperacillin/tazobactam. They are shown to have a lower risk of mortality.    Report Status 01/20/2021 FINAL  Final   Organism ID, Bacteria ESCHERICHIA COLI (A)  Final       Susceptibility   Escherichia coli - MIC*    AMPICILLIN >=32 RESISTANT Resistant     CEFAZOLIN >=64 RESISTANT Resistant     CEFEPIME 16 RESISTANT Resistant     CEFTRIAXONE >=64 RESISTANT Resistant     CIPROFLOXACIN >=4 RESISTANT Resistant     GENTAMICIN <=1 SENSITIVE Sensitive     IMIPENEM <=0.25 SENSITIVE Sensitive     NITROFURANTOIN 128 RESISTANT Resistant     TRIMETH/SULFA >=320 RESISTANT Resistant     AMPICILLIN/SULBACTAM 8 SENSITIVE Sensitive     PIP/TAZO <=4 SENSITIVE Sensitive     * >=100,000 COLONIES/mL ESCHERICHIA COLI  MRSA Next Gen by PCR, Nasal     Status: None   Collection Time: 01/17/21  9:47 PM   Specimen: Nasal Mucosa; Nasal Swab  Result  Value Ref Range Status   MRSA by PCR Next Gen NOT DETECTED NOT DETECTED Final    Comment: (NOTE) The GeneXpert MRSA Assay (FDA approved for NASAL specimens only), is one component of a comprehensive MRSA colonization surveillance program. It is not intended to diagnose MRSA infection nor to guide or monitor treatment for MRSA infections. Test performance is not FDA approved in patients less than 61 years old. Performed at [facility], [place] [address], [place], [place] [address]   Resp Panel by RT-PCR (Flu A&B, Covid) Nasopharyngeal Swab     Status: None   Collection Time: 01/23/21  2:40 PM   Specimen: Nasopharyngeal Swab; Nasopharyngeal(NP) swabs in vial transport medium  Result Value Ref Range Status   SARS Coronavirus 2 by RT PCR NEGATIVE NEGATIVE Final    Comment: (NOTE) SARS-CoV-2 target nucleic acids are NOT DETECTED.  The SARS-CoV-2 RNA is generally detectable in upper respiratory specimens during the acute phase of infection. The lowest concentration of SARS-CoV-2 viral copies this assay can detect is 138 copies/mL. A negative result does not preclude SARS-Cov-2 infection and should not be used as the sole basis for treatment or other patient management decisions. A negative result may occur with  improper  specimen collection/handling, submission of specimen other than nasopharyngeal swab, presence of viral mutation(s) within the areas targeted by this assay, and inadequate number of viral copies(<138 copies/mL). A negative result must be combined with clinical observations, patient history, and epidemiological information. The expected result is Negative.  Fact Sheet for Patients:  EntrepreneurPulse.com.au  Fact Sheet for Healthcare Providers:  IncredibleEmployment.be  This test is no t yet approved or cleared by the [place] FDA and  has been authorized for detection and/or diagnosis of SARS-CoV-2 by FDA under an Emergency Use Authorization (EUA). This EUA will remain  in effect (meaning this test can be used) for the duration of the COVID-19 declaration under Section 564(b)(1) of the Act, 21 U.S.C.section 360bbb-3(b)(1), unless the authorization is terminated  or revoked sooner.       Influenza A by PCR NEGATIVE NEGATIVE Final   Influenza B by PCR NEGATIVE NEGATIVE Final    Comment: (NOTE) The Xpert Xpress SARS-CoV-2/FLU/RSV plus assay is intended as an aid in the diagnosis of influenza from Nasopharyngeal swab specimens and should not be used as a sole basis for treatment. Nasal washings and aspirates are unacceptable for Xpert Xpress SARS-CoV-2/FLU/RSV testing.  Fact Sheet for Patients: EntrepreneurPulse.com.au  Fact Sheet for Healthcare Providers: IncredibleEmployment.be  This test is not yet approved or cleared by the [place] FDA and has been authorized for detection and/or diagnosis of SARS-CoV-2 by FDA under an Emergency Use Authorization (EUA). This EUA will remain in effect (meaning this test can be used) for the duration of the COVID-19 declaration under Section 564(b)(1) of the Act, 21 U.S.C. section 360bbb-3(b)(1), unless the authorization is terminated or revoked.  Performed at  [facility], [place] [address] [place], [place] [address]      Time coordinating discharge: [place].   SIGNED:   Hosie Poisson, MD  [facility]

## 2021-01-24 NOTE — TOC Transition Note (Signed)
Transition of Care (TOC) - CM/SW Discharge Note *Discharged to [facility] *Number for Report: [phone]   Patient Details  Name: [name] [name] MRN: [id number] Date of Birth: 12-22-1928  Transition of Care [facility]) CM/SW Contact:  Sable Feil, LCSW Phone Number: 01/24/2021, 10:56 AM   Clinical Narrative:  Patient medically stable for discharge and returning to [facility] where she is a long-term care resident. Facility admissions director contacted and informed of discharge. Patient's sister Lavella Hammock ([phone]) contacted and informed of discharge. Clinicals transmitted to SNF and ambulance transport arranged.      Final next level of care: [place] ([place]) Barriers to Discharge: Barriers Resolved   Patient Goals and CMS Choice Patient states their goals for this hospitalization and ongoing recovery are:: Patient agreeable to returning to [facility] at discharge. CMS Medicare.gov Compare Post Acute Care list provided to:: Other (Comment Required) (Not needed as patient LTC at [name]) Choice offered to / list presented to : NA  Discharge Placement   Existing PASRR number confirmed : 01/23/21          Patient chooses bed at: Other - please specify in the comment section below: ([facility]) Patient to be transferred to facility by: Non-emergency ambulance transport Name of family member notified: Sister Lavella Hammock: [phone] Patient and family notified of of transfer: 01/24/21  Discharge Plan and Services In-house Referral: Clinical Social Work                                   Social Determinants of Health (SDOH) Interventions  No SDOH interventions requested or needed at discharge   Readmission Risk Interventions Readmission Risk Prevention Plan 06/07/2020  Transportation Screening Complete  Social Work Consult for [place] Planning/Counseling  [place] Not Applicable  Medication Review Press photographer) Complete  Some recent data might be hidden

## 2021-01-24 NOTE — Plan of Care (Signed)
  Problem: Education: Goal: Knowledge of General Education information will improve Description: Including pain rating scale, medication(s)/side effects and non-pharmacologic comfort measures Outcome: Progressing   Problem: Clinical Measurements: Goal: Will remain free from infection Outcome: Progressing   Problem: Nutrition: Goal: Adequate nutrition will be maintained Outcome: Progressing   Problem: Pain Managment: Goal: General experience of comfort will improve Outcome: Progressing   Problem: Education: Goal: Knowledge of General Education information will improve Description: Including pain rating scale, medication(s)/side effects and non-pharmacologic comfort measures Outcome: Progressing   Problem: Clinical Measurements: Goal: Will remain free from infection Outcome: Progressing   Problem: Nutrition: Goal: Adequate nutrition will be maintained Outcome: Progressing   Problem: Pain Managment: Goal: General experience of comfort will improve Outcome: Progressing

## 2021-01-25 NOTE — Progress Notes (Signed)
DISCHARGE NOTE HOME [name] [name] to be discharged Skilled nursing facility per MD order. Discussed prescriptions and follow up appointments with the patient. Prescriptions given to patient; medication list explained in detail. Patient verbalized understanding.  Skin clean, dry and intact without evidence of skin break down, no evidence of skin tears noted. IV catheter discontinued. Site without signs and symptoms of complications. Dressing and pressure applied. Pt denies pain at the site currently. No complaints noted.  Patient free of lines, drains, and wounds.   An After Visit Summary (AVS) was printed and given to the patient. Patient escorted via stretcher, and discharged to SNF via [facility]AR.  Jeananne Rama, RN

## 2021-03-22 IMAGING — CT CT CERVICAL SPINE W/O CM
3 of 4 series · 13 of 33 positions shown, 16 images · non-contrast
Comparison: CT head and C-spine October 10, 2019

CLINICAL DATA: Unwitnessed fall x4 over 48 hours

EXAM:
CT HEAD WITHOUT CONTRAST
CT CERVICAL SPINE WITHOUT CONTRAST
TECHNIQUE: Multidetector CT imaging of the head and cervical spine was
performed following the standard protocol without intravenous
contrast. Multiplanar CT image reconstructions of the cervical spine
were also generated.

[Series 5: orthogonal bone · axial · 0.23mm/px · z∈[-343,-204]mm · 5 of 109 slices shown, 7 images]
[im 16/109  soft-tissue]
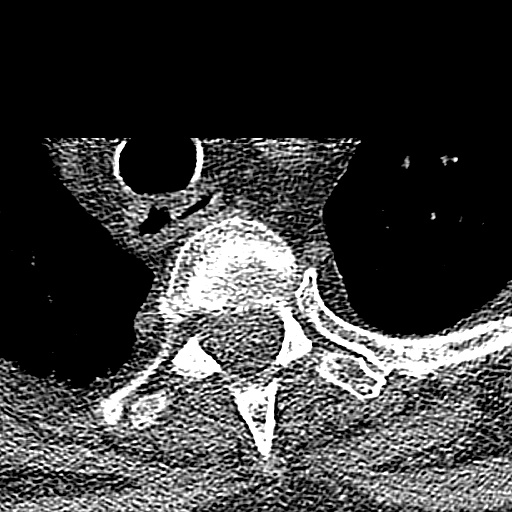
[im 16/109  bone]
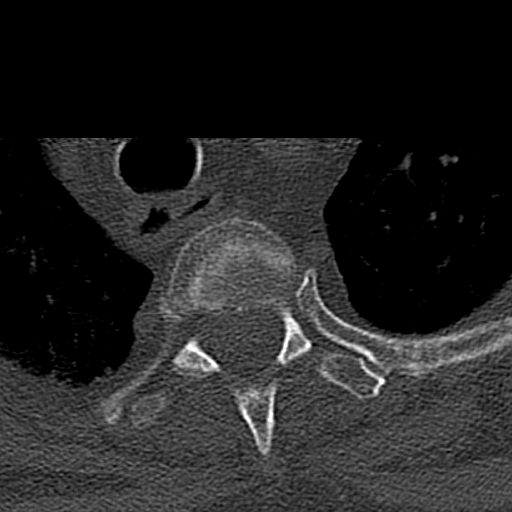
[im 31/109  bone]
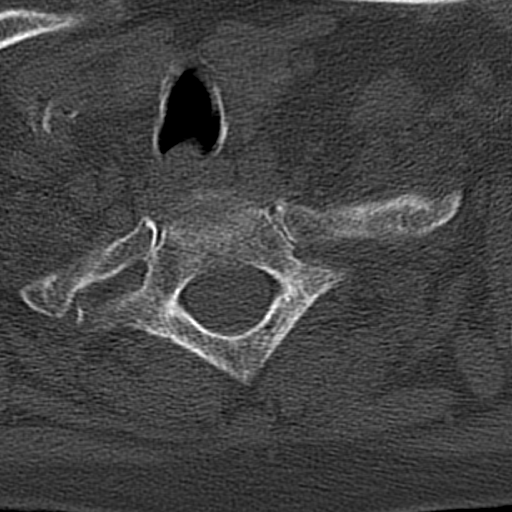
[im 62/109  bone]
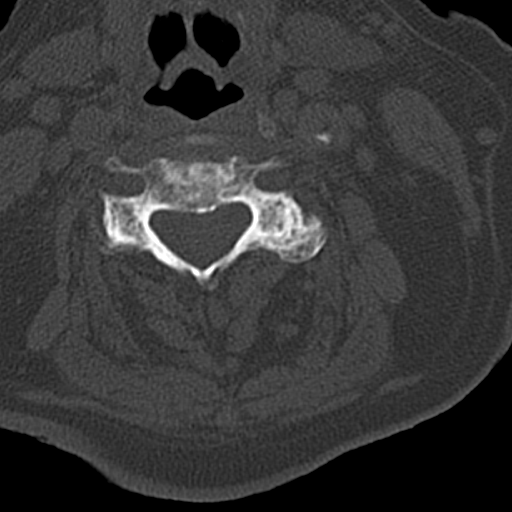
[im 78/109  bone]
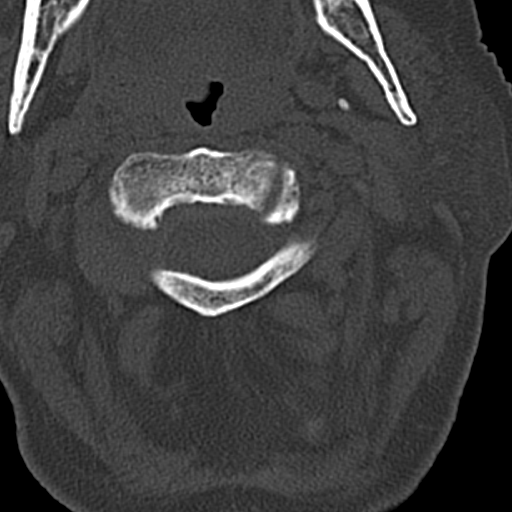
[im 93/109  soft-tissue]
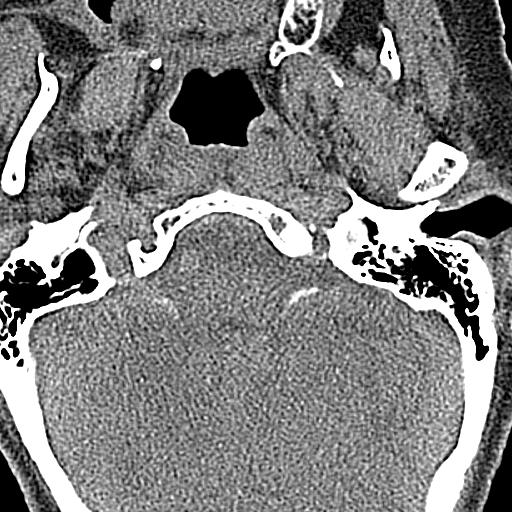
[im 93/109  bone]
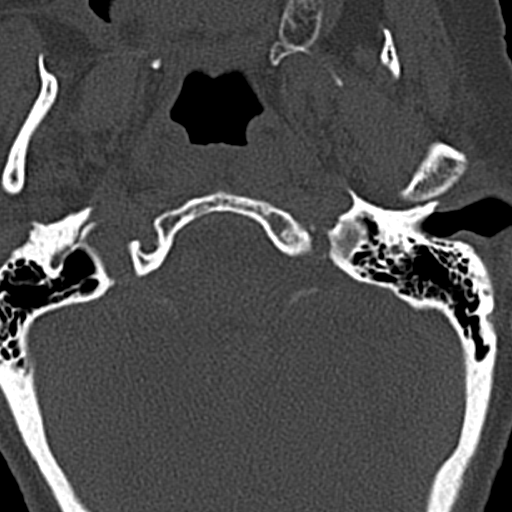

[Series 6: coronal bone · coronal · 0.27mm/px · 3 of 61 slices shown]
[im 13/61  bone]
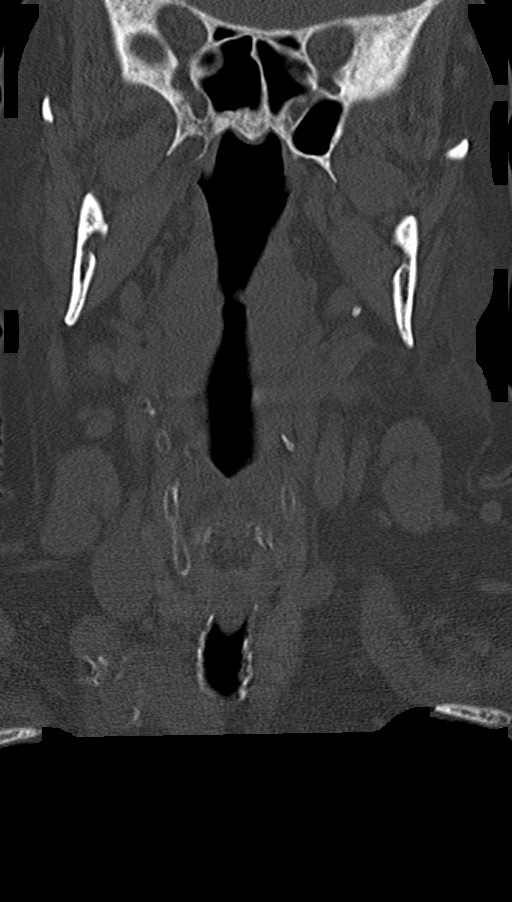
[im 25/61  bone]
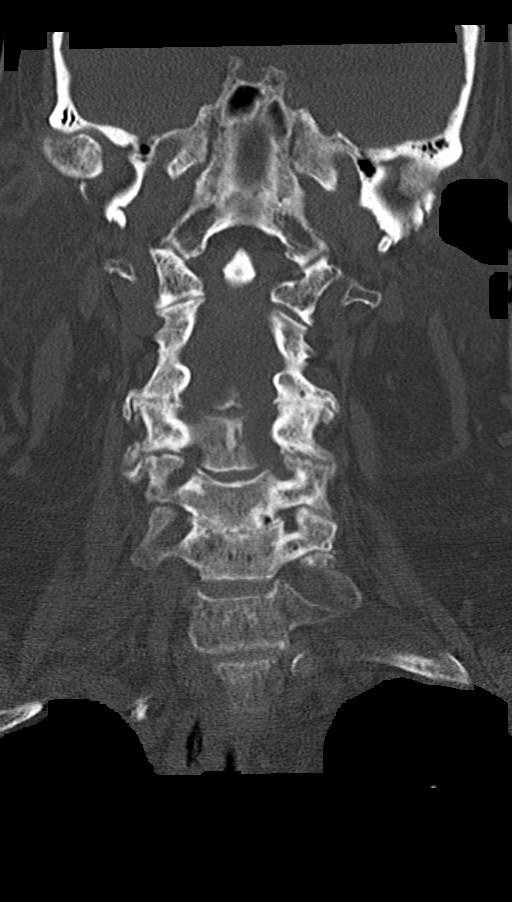
[im 37/61  bone]
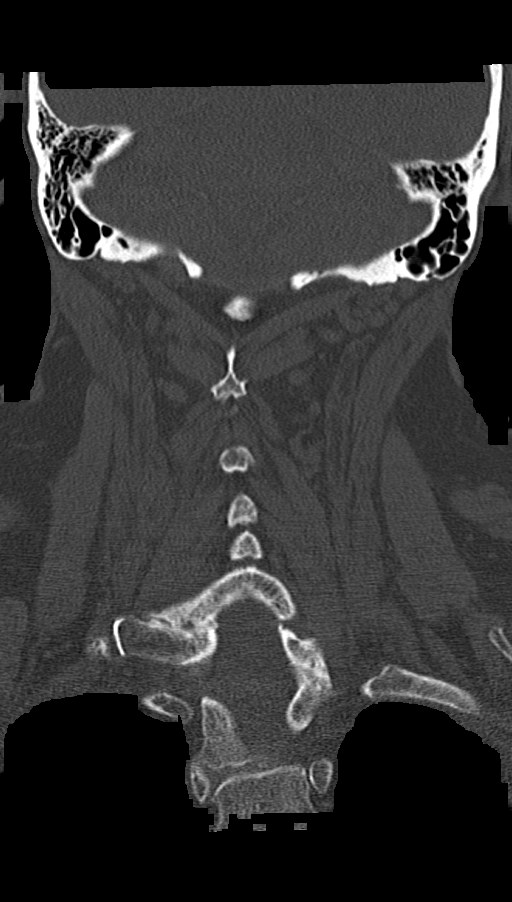

[Series 7: sagittal bone · sagittal · 0.37mm/px · 5 of 61 slices shown, 6 images]
[im 21/61  bone]
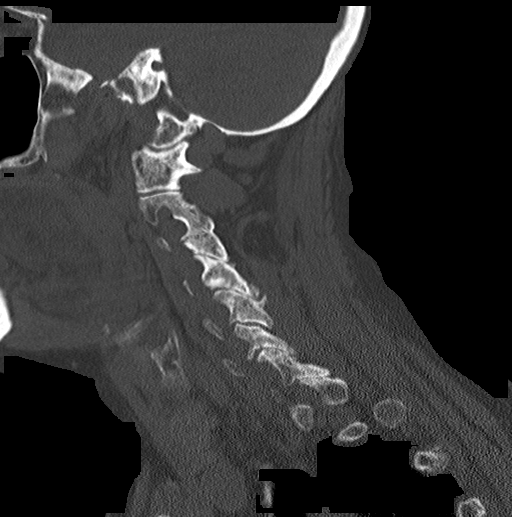
[im 26/61  bone]
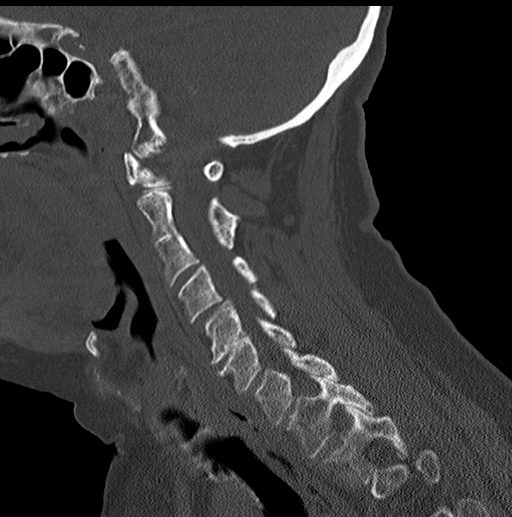
[im 31/61  soft-tissue]
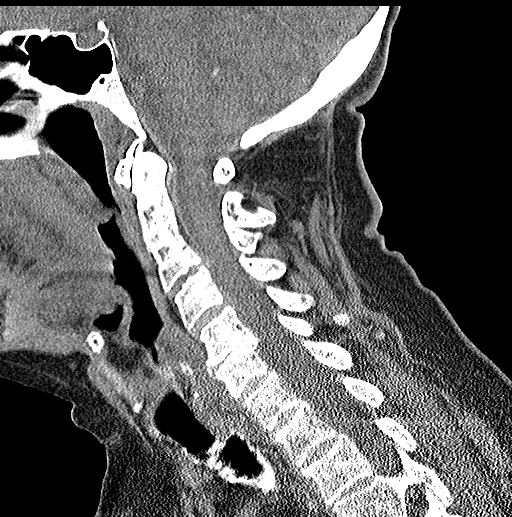
[im 31/61  bone]
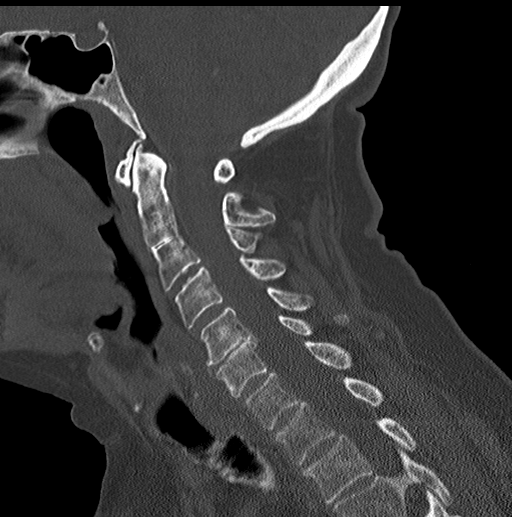
[im 36/61  bone]
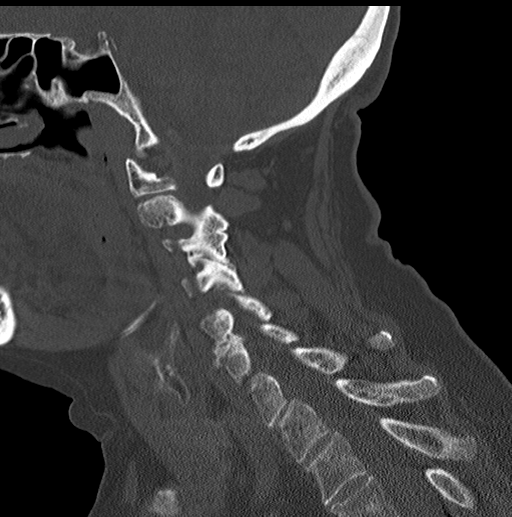
[im 41/61  bone]
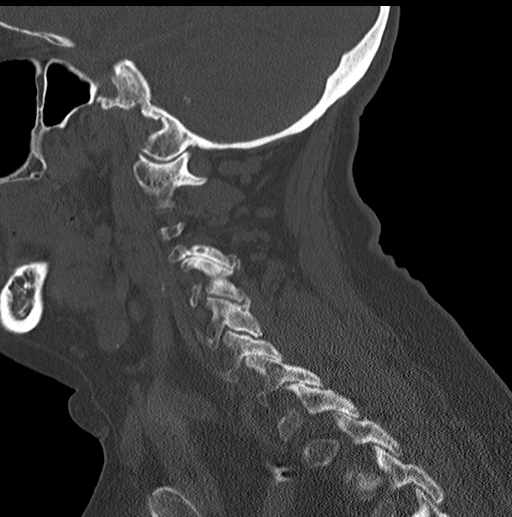

[13 of 33 positions shown; findings below may reference images not displayed]

FINDINGS: CT HEAD FINDINGS

Brain: Mixed density left whole hemispheric fluid collection
measuring 5 mm in maximum dimension on coronal image 46. No evidence
of acute large vascular territory infarction, hydrocephalus, or mass
lesion/mass effect. Mild age related atrophy and chronic
microvascular ischemic change. Right basal gangliar lacunar type
infarct.

Vascular: No hyperdense vessel. Atherosclerotic calcification of the
internal carotid and vertebral arteries.

Skull: Prior right parietal craniotomy.  No acute abnormality.

Sinuses/Orbits: No acute finding.

Other: None

CT CERVICAL SPINE FINDINGS

Alignment: Mild positional straightening of the normal cervical
lordosis. No acute listhesis.

Skull base and vertebrae: No acute fracture. No primary bone lesion
or focal pathologic process.

Soft tissues and spinal canal: No prevertebral fluid or swelling. No
visible canal hematoma.

Disc levels: Similar mild multilevel degenerative changes spine.
Partial bony fusion at C2-C3.

Upper chest: Left upper lobe calcified granuloma.

Other: Bilateral carotid calcifications.
IMPRESSION: 1. Mixed density left whole hemispheric fluid collection measuring 5
mm in maximum dimension, most consistent with an acute on chronic
subdural hematoma. No significant mass effect.
2. No evidence of acute fracture or traumatic listhesis of the
cervical spine.
3. Similar mild multilevel degenerative changes of the cervical
spine.

These results were called by telephone at the time of interpretation
on 06/05/2020 at [DATE] to provider ROSALEE JIM , who verbally
acknowledged these results.

## 2021-03-22 IMAGING — CT CT HEAD W/O CM
3 series · 14 of 47 positions shown, 16 images · non-contrast
Comparison: CT head and C-spine October 10, 2019

CLINICAL DATA: Unwitnessed fall x4 over 48 hours

EXAM:
CT HEAD WITHOUT CONTRAST
CT CERVICAL SPINE WITHOUT CONTRAST
TECHNIQUE: Multidetector CT imaging of the head and cervical spine was
performed following the standard protocol without intravenous
contrast. Multiplanar CT image reconstructions of the cervical spine
were also generated.

[Series 3: head wo · axial · 0.44mm/px · z∈[-168,-38]mm · 8 of 32 slices shown, 10 images]
[im 3/32  brain]
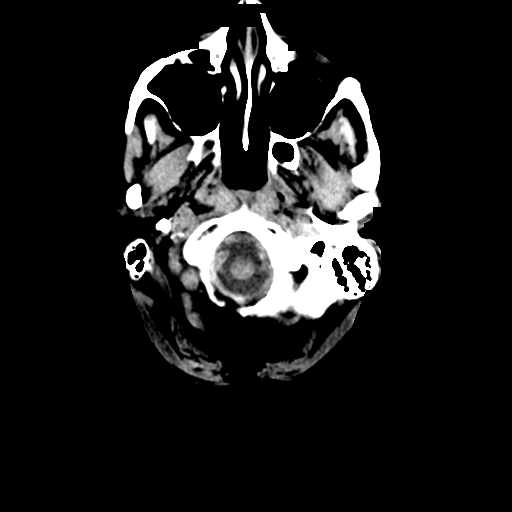
[im 3/32  bone]
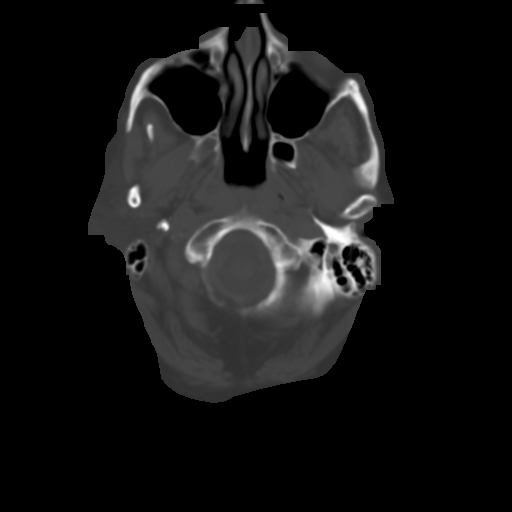
[im 7/32  brain]
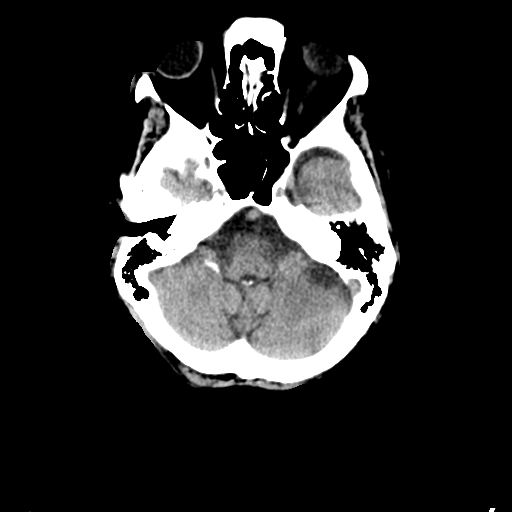
[im 10/32  brain]
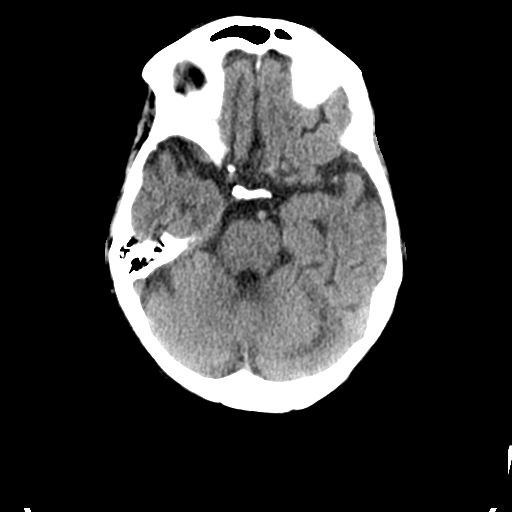
[im 14/32  brain]
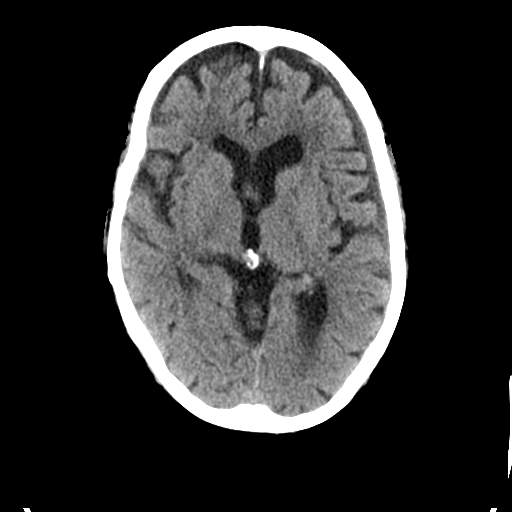
[im 18/32  brain]
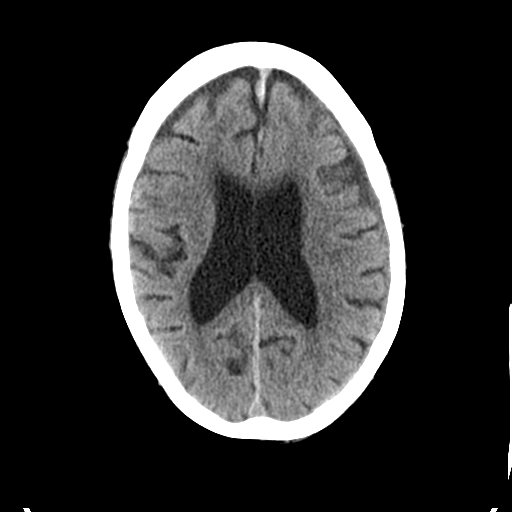
[im 18/32  bone]
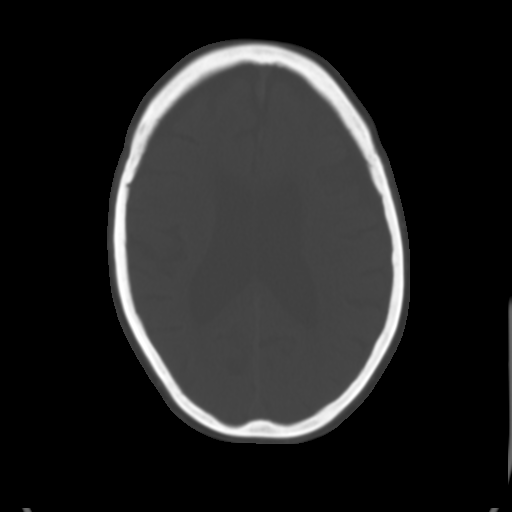
[im 22/32  brain]
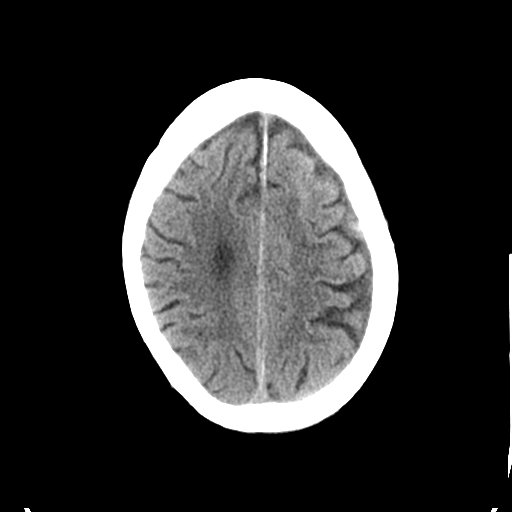
[im 25/32  brain]
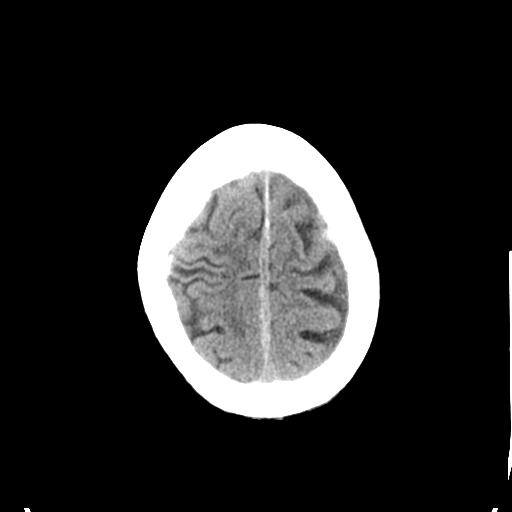
[im 29/32  brain]
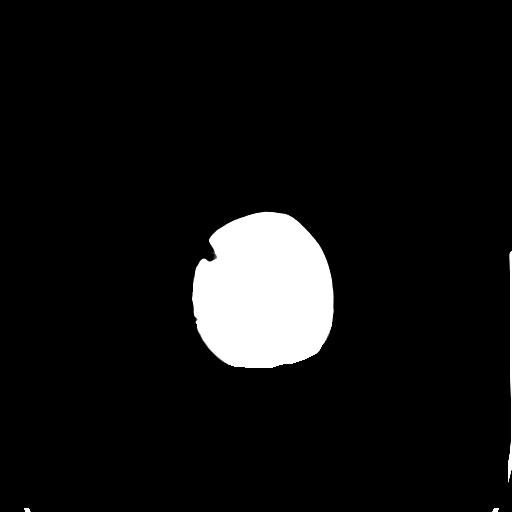

[Series 5: coronal soft tissue · coronal · 0.31mm/px · 3 of 70 slices shown]
[im 24/70  brain]
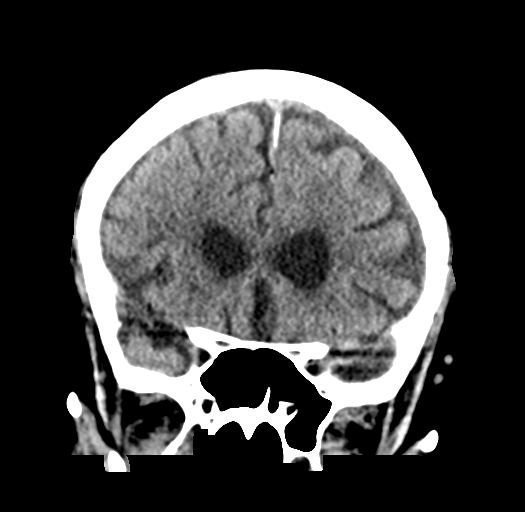
[im 31/70  brain]
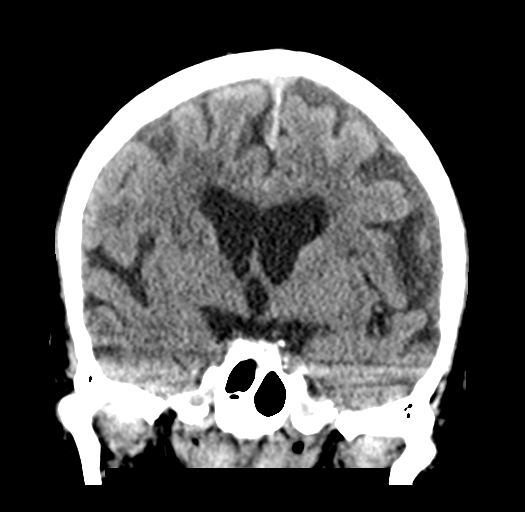
[im 39/70  brain]
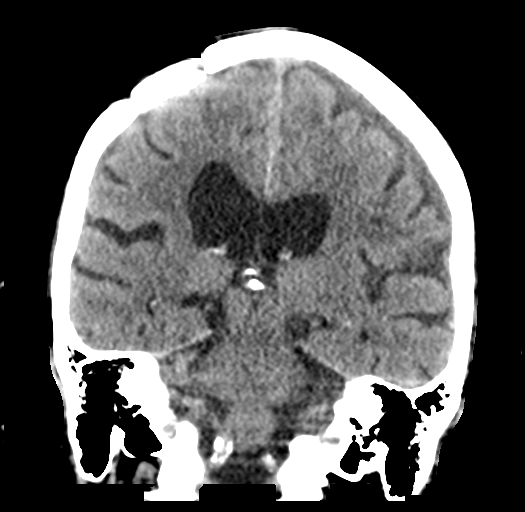

[Series 6: sagittal soft tissue · sagittal · 0.31mm/px · 3 of 53 slices shown]
[im 18/53  brain]
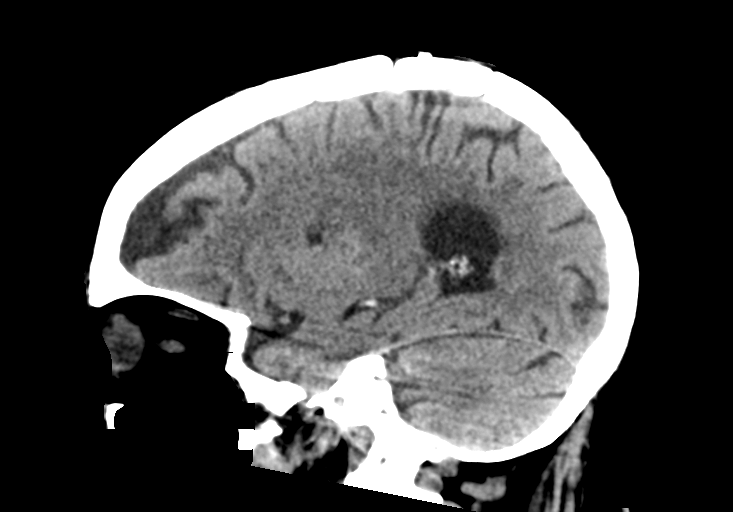
[im 27/53  brain]
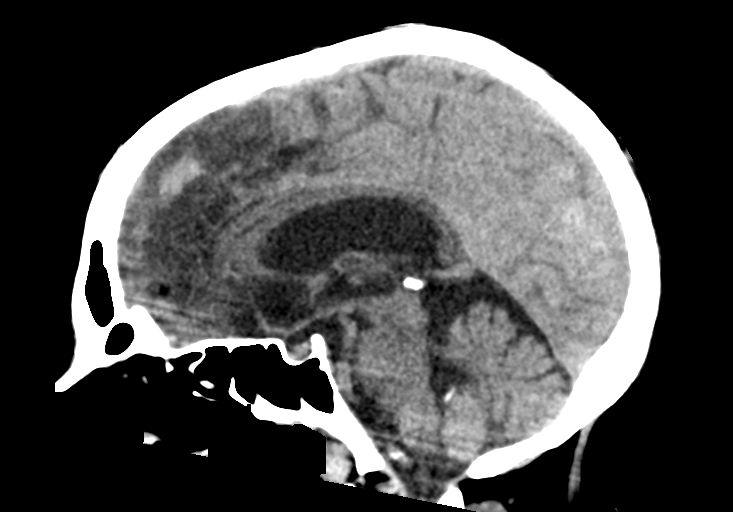
[im 35/53  brain]
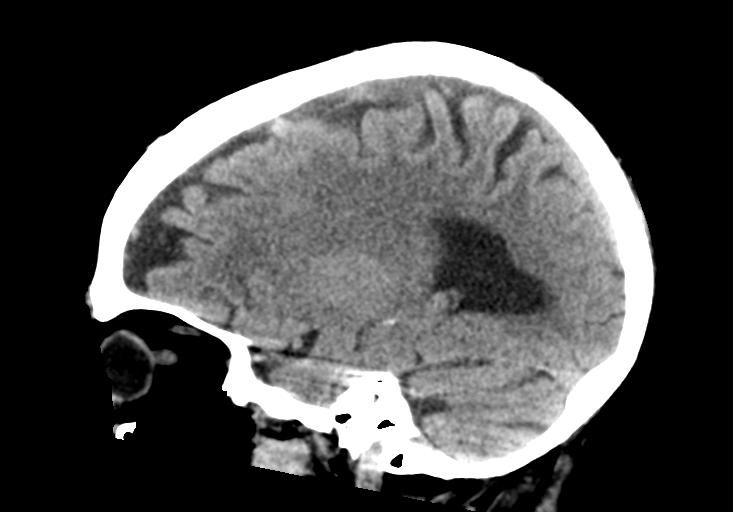

[14 of 47 positions shown; findings below may reference images not displayed]

FINDINGS: CT HEAD FINDINGS

Brain: Mixed density left whole hemispheric fluid collection
measuring 5 mm in maximum dimension on coronal image 46. No evidence
of acute large vascular territory infarction, hydrocephalus, or mass
lesion/mass effect. Mild age related atrophy and chronic
microvascular ischemic change. Right basal gangliar lacunar type
infarct.

Vascular: No hyperdense vessel. Atherosclerotic calcification of the
internal carotid and vertebral arteries.

Skull: Prior right parietal craniotomy.  No acute abnormality.

Sinuses/Orbits: No acute finding.

Other: None

CT CERVICAL SPINE FINDINGS

Alignment: Mild positional straightening of the normal cervical
lordosis. No acute listhesis.

Skull base and vertebrae: No acute fracture. No primary bone lesion
or focal pathologic process.

Soft tissues and spinal canal: No prevertebral fluid or swelling. No
visible canal hematoma.

Disc levels: Similar mild multilevel degenerative changes spine.
Partial bony fusion at C2-C3.

Upper chest: Left upper lobe calcified granuloma.

Other: Bilateral carotid calcifications.
IMPRESSION: 1. Mixed density left whole hemispheric fluid collection measuring 5
mm in maximum dimension, most consistent with an acute on chronic
subdural hematoma. No significant mass effect.
2. No evidence of acute fracture or traumatic listhesis of the
cervical spine.
3. Similar mild multilevel degenerative changes of the cervical
spine.

These results were called by telephone at the time of interpretation
on 06/05/2020 at [DATE] to provider ROSALEE JIM , who verbally
acknowledged these results.

## 2021-04-12 ENCOUNTER — Non-Acute Institutional Stay: Payer: Medicare Other | Admitting: Hospice

## 2021-04-12 ENCOUNTER — Other Ambulatory Visit: Payer: Self-pay

## 2021-04-12 DIAGNOSIS — G8929 Other chronic pain: Secondary | ICD-10-CM

## 2021-04-12 DIAGNOSIS — M545 Low back pain, unspecified: Secondary | ICD-10-CM

## 2021-04-12 DIAGNOSIS — F339 Major depressive disorder, recurrent, unspecified: Secondary | ICD-10-CM

## 2021-04-12 DIAGNOSIS — G4700 Insomnia, unspecified: Secondary | ICD-10-CM

## 2021-04-12 DIAGNOSIS — Z515 Encounter for palliative care: Secondary | ICD-10-CM

## 2021-04-12 NOTE — Progress Notes (Addendum)
[facility] Consult Note Telephone: [phone]  Fax: [phone]  PATIENT NAME: [name] [name] DOB: [birth year] MRN: [id number]  PRIMARY CARE PROVIDER:   Celene Squibb, MD Celene Squibb, MD [place],  [place] [address]  REFERRING PROVIDER: Celene Squibb, MD Celene Squibb, MD [place],  [place] [address]  RESPONSIBLE PARTY:  [place]     Name Relation Home Work Mobile   Reed,Carolyn Sister [phone]     Warrick Parisian   [phone]   Pettigrew,Cheryl Other   [phone]       Visit is to build trust and highlight Palliative Medicine as specialized medical care for people living with serious illness, aimed at facilitating better quality of life through symptoms relief, assisting with advance care planning and complex medical decision making. This is a follow up visit.  RECOMMENDATIONS/PLAN:   Advance Care Planning/Code Status:Patient is a Do Not Resuscitate  Goals of Care: Goals of care include to maximize quality of life and symptom management. MOST form selections in facility chart include Comfort measures, no feeding tube, IV fluids if indicated, determine use of antibiotic when infecion occurs. Palliative care team will continue to support patient, patient's family, and medical team.  Symptom management/Plan:  Low back pain: Stable. Managed with Hydrocodone, Tizanidine. Continue to participate in restorative activities in the facility. Heat therapy.  Depression: Encourage to participate in facility activities. Followed by Psych. Continue Trintellix as ordered. Follow up with Psych as planned/needed.  Insomnia: Managed with Melatonin. Sleep hygiene.  Follow up: Palliative care will continue to follow for complex medical decision making, advance care planning, and clarification of goals. Return 6 weeks or prn. Encouraged to call provider sooner with any concerns.  CHIEF  COMPLAINT: Palliative follow up  HISTORY OF PRESENT ILLNESS:  [name] [name] a 88 y.o. female with multiple medical problems including chronic low back pain currently managed with Hydrocodone and Tizanidine. History of CKD4, CAD, bladder cancer-Foley cath in place, seizure disorder, recurrent falls, depression. She endorsed pain well managed with current regimen; in no respiratory distress.  History obtained from review of EMR, discussion with primary team, family and/or patient. Records reviewed and summarized above. All 10 point systems reviewed and are negative except as documented in history of present illness above  Review and summarization of Epic records shows history from other than patient.   Palliative Care was asked to follow this patient o help address complex decision making in the context of advance care planning and goals of care clarification.   PHYSICAL EXAM  General: In no acute distress, resting in bed, appropriately dressed Cardiovascular: regular rate and rhythm; no edema in BLE Pulmonary: no cough, no increased work of breathing, normal respiratory effort Abdomen: soft, non tender, no guarding, positive bowel sounds in all quadrants GU:  no suprapubic tenderness; Foley cath in place Eyes: Normal lids, no discharge ENMT: Moist mucous membranes Musculoskeletal: weakness, non ambulatory, gets around mostly in her wheelchair Skin: no rash to visible skin, warm without cyanosis,  Psych: non-anxious affect Neurological: Weakness but otherwise non focal Heme/lymph/immuno: no bruises, no bleeding  PERTINENT MEDICATIONS:  Outpatient Encounter Medications as of 04/12/2021  Medication Sig   acetaminophen (TYLENOL) 325 MG tablet Take 325 mg by mouth every 6 (six) hours as needed for mild pain.   amLODipine (NORVASC) 5 MG tablet Take 1 tablet (5 mg total) by mouth daily.   Cranberry 450 MG TABS  Take 450 mg by mouth daily.   Eyelid Cleansers (EYESCRUB) PADS Apply 1 each topically  daily.   levETIRAcetam (KEPPRA) 250 MG tablet Take 1 tablet (250 mg total) by mouth 2 (two) times daily.   linaclotide (LINZESS) 290 MCG CAPS capsule Take 290 mcg by mouth daily.   loperamide (IMODIUM) 2 MG capsule Take 2 mg by mouth every 4 (four) hours as needed for diarrhea or loose stools.   melatonin 3 MG TABS tablet Take 3 mg by mouth at bedtime.   Multiple Vitamins-Minerals (PRESERVISION AREDS PO) Take 1 capsule by mouth daily.   NONFORMULARY OR COMPOUNDED ITEM Apply 1 application topically in the morning and at bedtime. Anti -itch lotion 0.5-0.5% ( Mix with triamcinolone)   oxybutynin (DITROPAN-XL) 10 MG 24 hr tablet Take 10 mg by mouth daily.   phenol (CHLORASEPTIC) 1.4 % LIQD Use as directed 1 spray in the mouth or throat every 4 (four) hours as needed for throat irritation / pain.   rosuvastatin (CRESTOR) 10 MG tablet Take 10 mg by mouth daily.    sertraline (ZOLOFT) 25 MG tablet Take 25 mg by mouth daily.   triamcinolone cream (KENALOG) 0.1 % Apply 1 application topically 2 (two) times daily. Mix with Sarna lotion   Wheat Dextrin (BENEFIBER) POWD Take 1 Scoop by mouth daily at 6 (six) AM.   No facility-administered encounter medications on file as of 04/12/2021.    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    Latex Itching   Macrobid [Nitrofurantoin] Other (See Comments)    unknown    Penicillins Hives and Itching    Tolerated Ceftriaxone 01/2021   Sulfa Antibiotics Hives and Itching      I spent 45 minutes providing this consultation; this includes time spent with patient/family, chart review and documentation. More than 50% of the time in this consultation was spent on counseling and coordinating communication   Thank you for the opportunity to participate in the care of [name] [name] Please call our office at [phone] if we can be of additional assistance.  Note: Portions of this note were generated with Lobbyist. Dictation errors may occur despite best attempts at proofreading.  Teodoro Spray, NP

## 2021-05-08 ENCOUNTER — Other Ambulatory Visit: Payer: Self-pay

## 2021-05-08 ENCOUNTER — Non-Acute Institutional Stay: Payer: Medicare Other | Admitting: Hospice

## 2021-05-08 DIAGNOSIS — F339 Major depressive disorder, recurrent, unspecified: Secondary | ICD-10-CM

## 2021-05-08 DIAGNOSIS — Z515 Encounter for palliative care: Secondary | ICD-10-CM

## 2021-05-08 DIAGNOSIS — G8929 Other chronic pain: Secondary | ICD-10-CM

## 2021-05-08 DIAGNOSIS — G4700 Insomnia, unspecified: Secondary | ICD-10-CM

## 2021-05-08 NOTE — Progress Notes (Signed)
[facility] Consult Note Telephone: [phone]  Fax: [phone]  PATIENT NAME: [name] [name] DOB: [birth year] MRN: [id number]  PRIMARY CARE PROVIDER:   Celene Squibb, MD Celene Squibb, MD [place],  [place] [address]  REFERRING PROVIDER: Celene Squibb, MD Celene Squibb, MD [place],  [place] [address]  RESPONSIBLE PARTY:  [place]     Name Relation Home Work Mobile   Reed,Carolyn Sister [phone]     Warrick Parisian   [phone]   Pettigrew,Cheryl Other   [phone]       Visit is to build trust and highlight Palliative Medicine as specialized medical care for people living with serious illness, aimed at facilitating better quality of life through symptoms relief, assisting with advance care planning and complex medical decision making. This is a follow up visit.  RECOMMENDATIONS/PLAN:   Advance Care Planning/Code Status:Patient is a Do Not Resuscitate  Goals of Care: Goals of care include to maximize quality of life and symptom management. MOST form selections in facility chart include Comfort measures, no feeding tube, IV fluids if indicated, determine use of antibiotic when infecion occurs. Palliative care team will continue to support patient, patient's family, and medical team.  Symptom management/Plan:  Depression: Psych notes in facility records on visit 04/16/2021 indicates patient on Zoloft and Trintellix.  Nursing reports no psych meds on MAR at this time.  Nurse supervisor Mariann Laster to call psych NP today and clarify/obtain order for patient.  Encourage interpersonal interactions and to participate in facility activities. Followed by Psych.  Low back pain: Stable. Managed with Hydrocodone, Tizanidine. Continue to participate in restorative activities in the facility. Heat therapy.   Insomnia: Managed with Melatonin. Sleep hygiene.  Follow up: Palliative care will  continue to follow for complex medical decision making, advance care planning, and clarification of goals. Return 6 weeks or prn. Encouraged to call provider sooner with any concerns.  CHIEF COMPLAINT: Palliative follow up  HISTORY OF PRESENT ILLNESS:  [name] [name] a 88 y.o. female with multiple medical problems including chronic low back pain currently managed with Hydrocodone and Tizanidine. History of CKD4, CAD, bladder cancer-Foley cath in place, seizure disorder, recurrent falls, depression. She endorsed pain well managed with current regimen; in no respiratory distress.  She endorsed moodiness and sadness, and asked if she is on any medication for depression.  She denied any intention or plan to harm self.  History obtained from review of EMR, discussion with primary team, family and/or patient. Records reviewed and summarized above. All 10 point systems reviewed and are negative except as documented in history of present illness above  Review and summarization of Epic records shows history from other than patient.   Palliative Care was asked to follow this patient o help address complex decision making in the context of advance care planning and goals of care clarification.   PHYSICAL EXAM  General: In no acute distress, resting in bed, appropriately dressed Cardiovascular: regular rate and rhythm; no edema in BLE Pulmonary: no cough, no increased work of breathing, normal respiratory effort Abdomen: soft, non tender, no guarding, positive bowel sounds in all quadrants GU:  no suprapubic tenderness; Foley cath in place Eyes: Normal lids, no discharge ENMT: Moist mucous membranes Musculoskeletal: weakness, non ambulatory, gets around mostly in her wheelchair Skin: no rash to visible skin, warm without cyanosis,  Psych: Sad Neurological: Weakness but otherwise non focal Heme/lymph/immuno: no  bruises, no bleeding  PERTINENT MEDICATIONS:  Outpatient Encounter Medications as of 05/08/2021   Medication Sig   acetaminophen (TYLENOL) 325 MG tablet Take 325 mg by mouth every 6 (six) hours as needed for mild pain.   amLODipine (NORVASC) 5 MG tablet Take 1 tablet (5 mg total) by mouth daily.   Cranberry 450 MG TABS Take 450 mg by mouth daily.   Eyelid Cleansers (EYESCRUB) PADS Apply 1 each topically daily.   levETIRAcetam (KEPPRA) 250 MG tablet Take 1 tablet (250 mg total) by mouth 2 (two) times daily.   linaclotide (LINZESS) 290 MCG CAPS capsule Take 290 mcg by mouth daily.   loperamide (IMODIUM) 2 MG capsule Take 2 mg by mouth every 4 (four) hours as needed for diarrhea or loose stools.   melatonin 3 MG TABS tablet Take 3 mg by mouth at bedtime.   Multiple Vitamins-Minerals (PRESERVISION AREDS PO) Take 1 capsule by mouth daily.   NONFORMULARY OR COMPOUNDED ITEM Apply 1 application topically in the morning and at bedtime. Anti -itch lotion 0.5-0.5% ( Mix with triamcinolone)   oxybutynin (DITROPAN-XL) 10 MG 24 hr tablet Take 10 mg by mouth daily.   phenol (CHLORASEPTIC) 1.4 % LIQD Use as directed 1 spray in the mouth or throat every 4 (four) hours as needed for throat irritation / pain.   rosuvastatin (CRESTOR) 10 MG tablet Take 10 mg by mouth daily.    sertraline (ZOLOFT) 25 MG tablet Take 25 mg by mouth daily.   triamcinolone cream (KENALOG) 0.1 % Apply 1 application topically 2 (two) times daily. Mix with Sarna lotion   Wheat Dextrin (BENEFIBER) POWD Take 1 Scoop by mouth daily at 6 (six) AM.   No facility-administered encounter medications on file as of 05/08/2021.    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    Latex Itching   Macrobid [Nitrofurantoin] Other (See Comments)    unknown   Penicillins Hives and Itching    Tolerated Ceftriaxone 01/2021   Sulfa Antibiotics Hives and Itching      I spent 45 minutes providing this consultation; this includes time spent with patient/family, chart review and documentation. More than 50% of the time in this consultation was spent on counseling and coordinating communication   Thank you for the opportunity to participate in the care of KENNEDEY DIGILIO Please call our office at [phone] if we can be of additional assistance.  Note: Portions of this note were generated with Lobbyist. Dictation errors may occur despite best attempts at proofreading.  Teodoro Spray, NP

## 2021-07-09 ENCOUNTER — Non-Acute Institutional Stay: Payer: Medicare Other | Admitting: Hospice

## 2021-07-09 DIAGNOSIS — Z515 Encounter for palliative care: Secondary | ICD-10-CM

## 2021-07-09 DIAGNOSIS — F339 Major depressive disorder, recurrent, unspecified: Secondary | ICD-10-CM

## 2021-07-09 DIAGNOSIS — R634 Abnormal weight loss: Secondary | ICD-10-CM

## 2021-07-09 DIAGNOSIS — G8929 Other chronic pain: Secondary | ICD-10-CM

## 2021-07-09 DIAGNOSIS — G4700 Insomnia, unspecified: Secondary | ICD-10-CM

## 2021-07-09 NOTE — Progress Notes (Signed)
? ? ?[facility] Consult Note ?Telephone: [phone]  ?Fax: [phone] ? ?PATIENT NAME: PAOLA ALESHIRE ?DOB: [birth year] ?MRN: [id number] ? ?PRIMARY CARE PROVIDER:   Celene Squibb, MD ?Celene Squibb, MD ?[address] ?[address] ?[place],  [place] [address] ? ?REFERRING PROVIDER: Celene Squibb, MD ?Celene Squibb, MD ?[address] ?[address] ?[place],  [place] [address] ? ?RESPONSIBLE PARTY:  Hoyle Sauer ?Contact Information   ? ? Name Relation Home Work Mobile  ? Reed,Carolyn Sister [phone]    ? Pettigrew,David Son   [phone]  ? Pettigrew,Cheryl Other   [phone]  ? ?  ? ? ?Visit is to build trust and highlight Palliative Medicine as specialized medical care for people living with serious illness, aimed at facilitating better quality of life through symptoms relief, assisting with advance care planning and complex medical decision making. This is a follow up visit. ? ?RECOMMENDATIONS/PLAN:  ? ?Advance Care Planning/Code Status:Patient is a Do Not Resuscitate ? ?Goals of Care: Goals of care include to maximize quality of life and symptom management. MOST form selections in facility chart include Comfort measures, no feeding tube, IV fluids if indicated, determine use of antibiotic when infecion occurs. ?Palliative care team will continue to support patient, patient's family, and medical team. ? ?Symptom management/Plan:  ?Depression: Followed by psych.  Currently managed with mirtazapine.  Encourage interpersonal interactions and to participate in facility activities.  ?Weight loss: Ongoing weight loss, current weight 123.5 pounds down from 154 pounds 6 months ago.  Provide adequate time and assistance during meals to ensure adequate oral intake.  Ensure twice daily.  Check weight regularly per facility protocol. ?Low back pain: Stable. Managed with Hydrocodone, Tizanidine. Continue to participate in restorative activities in the facility. Heat therapy encouraged.   ?Insomnia: Sleep hygiene  reiterated.  Patient is currently on trazodone and melatonin; zolpidem as needed on hand. ?Follow up: Palliative care will continue to follow for complex medical decision making, advance care planning, and clarification of goals. Return 6 weeks or prn. Encouraged to call provider sooner with any concerns. ? ?CHIEF COMPLAINT: Palliative follow up ? ?HISTORY OF PRESENT ILLNESS:  KEONA BILYEU a 88 y.o. female with multiple medical problems including chronic low back pain, insomnia, depression.  History of CKD4, CAD, bladder cancer-Foley cath in place, seizure disorder, recurrent falls, She endorsed pain well managed with current regimen; in no respiratory distress.  She denied moodiness/sadness, in no pain or discomfort.  History obtained from review of EMR, discussion with primary team, family and/or patient. Records reviewed and summarized above. All 10 point systems reviewed and are negative except as documented in history of present illness above ? ?Review and summarization of Epic records shows history from other than patient.  ? ?Palliative Care was asked to follow this patient o help address complex decision making in the context of advance care planning and goals of care clarification.  ? ?PHYSICAL EXAM  ?General: In no acute distress, resting in bed, appropriately dressed ?Cardiovascular: regular rate and rhythm; no edema in BLE ?Pulmonary: no cough, no increased work of breathing, normal respiratory effort ?Abdomen: soft, non tender, no guarding, positive bowel sounds in all quadrants ?GU:  no suprapubic tenderness; Foley cath in place ?Eyes: Normal lids, no discharge ?ENMT: Moist mucous membranes ?Musculoskeletal: weakness, non ambulatory, gets around mostly in her wheelchair ?Skin: no rash to visible skin, warm without cyanosis,  ?Psych: pleasant ?Neurological: Weakness but otherwise non focal ?Heme/lymph/immuno: no bruises, no bleeding ? ?  PERTINENT MEDICATIONS:  ?Outpatient Encounter Medications as of  07/09/2021  ?Medication Sig  ? acetaminophen (TYLENOL) 325 MG tablet Take 325 mg by mouth every 6 (six) hours as needed for mild pain.  ? amLODipine (NORVASC) 5 MG tablet Take 1 tablet (5 mg total) by mouth daily.  ? Cranberry 450 MG TABS Take 450 mg by mouth daily.  ? Eyelid Cleansers (EYESCRUB) PADS Apply 1 each topically daily.  ? levETIRAcetam (KEPPRA) 250 MG tablet Take 1 tablet (250 mg total) by mouth 2 (two) times daily.  ? linaclotide (LINZESS) 290 MCG CAPS capsule Take 290 mcg by mouth daily.  ? loperamide (IMODIUM) 2 MG capsule Take 2 mg by mouth every 4 (four) hours as needed for diarrhea or loose stools.  ? melatonin 3 MG TABS tablet Take 3 mg by mouth at bedtime.  ? Multiple Vitamins-Minerals (PRESERVISION AREDS PO) Take 1 capsule by mouth daily.  ? NONFORMULARY OR COMPOUNDED ITEM Apply 1 application topically in the morning and at bedtime. Anti -itch lotion 0.5-0.5% ( Mix with triamcinolone)  ? oxybutynin (DITROPAN-XL) 10 MG 24 hr tablet Take 10 mg by mouth daily.  ? phenol (CHLORASEPTIC) 1.4 % LIQD Use as directed 1 spray in the mouth or throat every 4 (four) hours as needed for throat irritation / pain.  ? rosuvastatin (CRESTOR) 10 MG tablet Take 10 mg by mouth daily.   ? sertraline (ZOLOFT) 25 MG tablet Take 25 mg by mouth daily.  ? triamcinolone cream (KENALOG) 0.1 % Apply 1 application topically 2 (two) times daily. Mix with Sarna lotion  ? Wheat Dextrin (BENEFIBER) POWD Take 1 Scoop by mouth daily at 6 (six) AM.  ? ?No facility-administered encounter medications on file as of 07/09/2021.  ? ? ?HOSPICE ELIGIBILITY/DIAGNOSIS: TBD ? ?PAST MEDICAL HISTORY:  ?Past Medical History:  ?Diagnosis Date  ? Abnormal gait   ? Asthenia   ? Back pain   ? Bladder cancer ([place])   ? CAD (coronary artery disease)   ? Chronic pain   ? CKD (chronic kidney disease), stage IV ([place])   ? Dizziness   ? Dysphagia   ? 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 Essential hypertension, benign   ? Hypokalemia   ? Insomnia   ? Iron  deficiency   ? Macular degeneration   ? Mixed hyperlipidemia   ? Obesity   ? Postsurgical aortocoronary bypass status   ?  ?ALLERGIES:  ?Allergies  ?Allergen Reactions  ? Adhesive [Tape] Other (See Comments)  ?  Tears patients skin off.   ? Latex Itching  ? Macrobid [Nitrofurantoin] Other (See Comments)  ?  unknown  ? Penicillins Hives and Itching  ?  Tolerated Ceftriaxone 01/2021  ? Sulfa Antibiotics Hives and Itching  ?   ? ?I spent 45 minutes providing this consultation; this includes time spent with patient/family, chart review and documentation. More than 50% of the time in this consultation was spent on counseling and coordinating communication  ? ?Thank you for the opportunity to participate in the care of TEOLA FELIPE Please call our office at [phone] if we can be of additional assistance. ? ?Note: Portions of this note were generated with Lobbyist. Dictation errors may occur despite best attempts at proofreading. ? ?Teodoro Spray, NP ? ?  ?

## 2021-09-27 ENCOUNTER — Non-Acute Institutional Stay: Payer: Medicare Other | Admitting: Hospice

## 2021-09-27 DIAGNOSIS — G4700 Insomnia, unspecified: Secondary | ICD-10-CM

## 2021-09-27 DIAGNOSIS — Z515 Encounter for palliative care: Secondary | ICD-10-CM

## 2021-09-27 DIAGNOSIS — F339 Major depressive disorder, recurrent, unspecified: Secondary | ICD-10-CM

## 2021-09-27 DIAGNOSIS — M545 Low back pain, unspecified: Secondary | ICD-10-CM

## 2021-12-19 ENCOUNTER — Encounter (INDEPENDENT_AMBULATORY_CARE_PROVIDER_SITE_OTHER): Payer: Medicare Other | Admitting: Ophthalmology

## 2022-02-28 ENCOUNTER — Non-Acute Institutional Stay: Payer: Medicare Other | Admitting: Hospice

## 2022-02-28 DIAGNOSIS — G4700 Insomnia, unspecified: Secondary | ICD-10-CM

## 2022-02-28 DIAGNOSIS — G40909 Epilepsy, unspecified, not intractable, without status epilepticus: Secondary | ICD-10-CM

## 2022-02-28 DIAGNOSIS — Z515 Encounter for palliative care: Secondary | ICD-10-CM

## 2022-02-28 DIAGNOSIS — F419 Anxiety disorder, unspecified: Secondary | ICD-10-CM

## 2022-02-28 DIAGNOSIS — M545 Low back pain, unspecified: Secondary | ICD-10-CM

## 2022-02-28 DIAGNOSIS — F339 Major depressive disorder, recurrent, unspecified: Secondary | ICD-10-CM

## 2022-02-28 NOTE — Progress Notes (Signed)
[facility] Consult Note Telephone: [phone]  Fax: [phone]  PATIENT NAME: [name] [name] DOB: [birth year] MRN: [id number]  PRIMARY CARE PROVIDER:   Celene Squibb, MD Celene Squibb, MD [place],  [place] [address]  REFERRING PROVIDER: Celene Squibb, MD Celene Squibb, MD [place],  [place] [address]  RESPONSIBLE PARTY:  [place]     Name Relation Home Work Mobile   Reed,Carolyn Sister [phone]     Warrick Parisian   [phone]   Pettigrew,Cheryl Other   [phone]       Visit is to build trust and highlight Palliative Medicine as specialized medical care for people living with serious illness, aimed at facilitating better quality of life through symptoms relief, assisting with advance care planning and complex medical decision making. This is a follow up visit.  RECOMMENDATIONS/PLAN:   Advance Care Planning/Code Status:Patient is a Do Not Resuscitate  Goals of Care: Goals of care include to maximize quality of life and symptom management. MOST form selections in facility chart include Comfort measures, no feeding tube, IV fluids if indicated, determine use of antibiotic when infecion occurs. Palliative care team will continue to support patient, patient's family, and medical team.  Symptom management/Plan:  Anxiety: Continue Buspirone as ordered. Deescalation technique. Psych following.  Depression: Continue mirtazapine and follow-up with psych as planned/needed.  Continue interpersonal interactions and to participate in facility activities.  Anxiety: Continue buspirone as ordered.  De-escalation techniques.  Psych consult as needed/planned. Seizure disorder: Managed with Levitracetam. Neurologist consult as needed. Low back pain: Chronic.  Managed with Hydrocodone, Tizanidine. Continue to participate in restorative activities in the facility. Heat therapy encouraged.    Insomnia: Continue trazodone.  Sleep hygiene reiterated.   Follow up: Palliative care will continue to follow for complex medical decision making, advance care planning, and clarification of goals. Return 6 weeks or prn. Encouraged to call provider sooner with any concerns.  CHIEF COMPLAINT: Palliative follow up  HISTORY OF PRESENT ILLNESS:  [name] [name] a 88 y.o. female with multiple medical problems including chronic low back pain, insomnia, depression.  History of CKD4, CAD, bladder cancer-Foley cath in place, seizure disorder, recurrent falls, She endorsed pain well managed with current regimen; in no respiratory distress. History obtained from review of EMR, discussion with primary team, family and/or patient. Records reviewed and summarized above. All 10 point systems reviewed and are negative except as documented in history of present illness above  Review and summarization of Epic records shows history from other than patient.   Palliative Care was asked to follow this patient o help address complex decision making in the context of advance care planning and goals of care clarification.   PERTINENT MEDICATIONS:  Outpatient Encounter Medications as of 09/27/2021  Medication Sig   acetaminophen (TYLENOL) 325 MG tablet Take 325 mg by mouth every 6 (six) hours as needed for mild pain.   amLODipine (NORVASC) 5 MG tablet Take 1 tablet (5 mg total) by mouth daily.   Cranberry 450 MG TABS Take 450 mg by mouth daily.   Eyelid Cleansers (EYESCRUB) PADS Apply 1 each topically daily.   levETIRAcetam (KEPPRA) 250 MG tablet Take 1 tablet (250 mg total) by mouth 2 (two) times daily.   linaclotide (LINZESS) 290 MCG CAPS capsule Take 290 mcg by mouth daily.   loperamide (IMODIUM) 2 MG capsule Take 2 mg by mouth every 4 (four) hours as  needed for diarrhea or loose stools.   melatonin 3 MG TABS tablet Take 3 mg by mouth at bedtime.   Multiple Vitamins-Minerals (PRESERVISION AREDS PO) Take 1 capsule by  mouth daily.   NONFORMULARY OR COMPOUNDED ITEM Apply 1 application topically in the morning and at bedtime. Anti -itch lotion 0.5-0.5% ( Mix with triamcinolone)   oxybutynin (DITROPAN-XL) 10 MG 24 hr tablet Take 10 mg by mouth daily.   phenol (CHLORASEPTIC) 1.4 % LIQD Use as directed 1 spray in the mouth or throat every 4 (four) hours as needed for throat irritation / pain.   rosuvastatin (CRESTOR) 10 MG tablet Take 10 mg by mouth daily.    sertraline (ZOLOFT) 25 MG tablet Take 25 mg by mouth daily.   triamcinolone cream (KENALOG) 0.1 % Apply 1 application topically 2 (two) times daily. Mix with Sarna lotion   Wheat Dextrin (BENEFIBER) POWD Take 1 Scoop by mouth daily at 6 (six) AM.   No facility-administered encounter medications on file as of 09/27/2021.    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    Latex Itching   Macrobid [Nitrofurantoin] Other (See Comments)    unknown   Penicillins Hives and Itching    Tolerated Ceftriaxone 01/2021   Sulfa Antibiotics Hives and Itching      I spent 45 minutes providing this consultation; this includes time spent with patient/family, chart review and documentation. More than 50% of the time in this consultation was spent on counseling and coordinating communication   Thank you for the opportunity to participate in the care of [name] [name] Please call our office  at [phone] if we can be of additional assistance.  Note: Portions of this note were generated with Lobbyist. Dictation errors may occur despite best attempts at proofreading.  Teodoro Spray, NP

## 2022-04-04 ENCOUNTER — Non-Acute Institutional Stay: Payer: Medicare Other | Admitting: Hospice

## 2022-04-04 DIAGNOSIS — G8929 Other chronic pain: Secondary | ICD-10-CM

## 2022-04-04 DIAGNOSIS — F339 Major depressive disorder, recurrent, unspecified: Secondary | ICD-10-CM

## 2022-04-04 DIAGNOSIS — Z515 Encounter for palliative care: Secondary | ICD-10-CM

## 2022-04-04 DIAGNOSIS — G4700 Insomnia, unspecified: Secondary | ICD-10-CM

## 2022-04-04 DIAGNOSIS — F419 Anxiety disorder, unspecified: Secondary | ICD-10-CM

## 2022-04-04 NOTE — Progress Notes (Signed)
[facility] Consult Note Telephone: [phone]  Fax: [phone]  PATIENT NAME: [name] [name] DOB: [birth year] MRN: [id number]  PRIMARY CARE PROVIDER:   Celene Squibb, MD Celene Squibb, MD [place],  [place] [address]  REFERRING PROVIDER: Celene Squibb, MD Celene Squibb, MD [place],  [place] [address]  RESPONSIBLE PARTY:  [place]     Name Relation Home Work Mobile   Reed,Carolyn Sister [phone]     Warrick Parisian   [phone]   Pettigrew,Cheryl Other   [phone]       Visit is to build trust and highlight Palliative Medicine as specialized medical care for people living with serious illness, aimed at facilitating better quality of life through symptoms relief, assisting with advance care planning and complex medical decision making. This is a follow up visit.  RECOMMENDATIONS/PLAN:   Advance Care Planning/Code Status:Patient is a Do Not Resuscitate  Goals of Care: Goals of care include to maximize quality of life and symptom management. MOST form selections in facility chart include Comfort measures, no feeding tube, IV fluids if indicated, determine use of antibiotic when infecion occurs. Palliative care team will continue to support patient, patient's family, and medical team.  Symptom management/Plan:  Anxiety: Managed with buspirone.  Continue deescalation technique. Psych following.  Depression: Continue mirtazapine and follow-up with psych as planned/needed.  Patient reports she is not moody but does not like to fall some of the facility activities.  Encouraged interpersonal interactions and to participate in facility activities.  Seizure disorder: Managed with Levitracetam. Neurologist consult as needed. Low back pain: Chronic/stable.  Managed with Hydrocodone, Tizanidine.  Effective.  Continue to participate in restorative activities in the facility. Heat therapy  encouraged.   Insomnia: Continue trazodone.  Sleep hygiene reiterated.   Follow up: Palliative care will continue to follow for complex medical decision making, advance care planning, and clarification of goals. Return 6 weeks or prn. Encouraged to call provider sooner with any concerns.  CHIEF COMPLAINT: Palliative follow up  HISTORY OF PRESENT ILLNESS:  [name] [name] a 88 y.o. female with multiple medical problems including chronic low back pain, insomnia, depression.  History of CKD4, CAD, bladder cancer-Foley cath in place, seizure disorder, recurrent falls, She endorsed pain well managed with current regimen; in no respiratory distress.  She also reports sleeping well.  History obtained from review of EMR, discussion with primary team, family and/or patient. Records reviewed and summarized above. All 10 point systems reviewed and are negative except as documented in history of present illness above  Review and summarization of Epic records shows history from other than patient.   Palliative Care was asked to follow this patient o help address complex decision making in the context of advance care planning and goals of care clarification.   PERTINENT MEDICATIONS:  Outpatient Encounter Medications as of 04/04/2022  Medication Sig   acetaminophen (TYLENOL) 325 MG tablet Take 325 mg by mouth every 6 (six) hours as needed for mild pain.   amLODipine (NORVASC) 5 MG tablet Take 1 tablet (5 mg total) by mouth daily.   Cranberry 450 MG TABS Take 450 mg by mouth daily.   Eyelid Cleansers (EYESCRUB) PADS Apply 1 each topically daily.   levETIRAcetam (KEPPRA) 250 MG tablet Take 1 tablet (250 mg total) by mouth 2 (two) times daily.   linaclotide (LINZESS) 290 MCG CAPS capsule Take 290 mcg by mouth daily.  loperamide (IMODIUM) 2 MG capsule Take 2 mg by mouth every 4 (four) hours as needed for diarrhea or loose stools.   melatonin 3 MG TABS tablet Take 3 mg by mouth at bedtime.   Multiple  Vitamins-Minerals (PRESERVISION AREDS PO) Take 1 capsule by mouth daily.   NONFORMULARY OR COMPOUNDED ITEM Apply 1 application topically in the morning and at bedtime. Anti -itch lotion 0.5-0.5% ( Mix with triamcinolone)   oxybutynin (DITROPAN-XL) 10 MG 24 hr tablet Take 10 mg by mouth daily.   phenol (CHLORASEPTIC) 1.4 % LIQD Use as directed 1 spray in the mouth or throat every 4 (four) hours as needed for throat irritation / pain.   rosuvastatin (CRESTOR) 10 MG tablet Take 10 mg by mouth daily.    sertraline (ZOLOFT) 25 MG tablet Take 25 mg by mouth daily.   triamcinolone cream (KENALOG) 0.1 % Apply 1 application topically 2 (two) times daily. Mix with Sarna lotion   Wheat Dextrin (BENEFIBER) POWD Take 1 Scoop by mouth daily at 6 (six) AM.   No facility-administered encounter medications on file as of 04/04/2022.    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    Latex Itching   Macrobid [Nitrofurantoin] Other (See Comments)    unknown   Penicillins Hives and Itching    Tolerated Ceftriaxone 01/2021   Sulfa Antibiotics Hives and Itching      I spent 35 minutes providing this consultation; this includes time spent with patient/family, chart review and documentation. More than 50% of the time in this consultation was spent on counseling and coordinating communication   Thank you for the opportunity to  participate in the care of [name] [name] Please call our office at [phone] if we can be of additional assistance.  Note: Portions of this note were generated with Lobbyist. Dictation errors may occur despite best attempts at proofreading.  Teodoro Spray, NP

## 2022-05-02 ENCOUNTER — Non-Acute Institutional Stay: Payer: 59 | Admitting: Hospice

## 2022-05-02 DIAGNOSIS — G4700 Insomnia, unspecified: Secondary | ICD-10-CM

## 2022-05-02 DIAGNOSIS — M545 Other chronic pain: Secondary | ICD-10-CM

## 2022-05-02 DIAGNOSIS — Z515 Encounter for palliative care: Secondary | ICD-10-CM

## 2022-05-02 DIAGNOSIS — F339 Major depressive disorder, recurrent, unspecified: Secondary | ICD-10-CM

## 2022-05-02 DIAGNOSIS — G40909 Epilepsy, unspecified, not intractable, without status epilepticus: Secondary | ICD-10-CM

## 2022-05-02 DIAGNOSIS — R634 Abnormal weight loss: Secondary | ICD-10-CM

## 2022-05-02 NOTE — Progress Notes (Signed)
[name] Consult Note Telephone: [phone]  Fax: [phone]  PATIENT NAME: [name] [name] DOB: [birth year] MRN: [id number]  PRIMARY CARE PROVIDER:   Celene Squibb, MD Celene Squibb, MD [place],  [place] [address]  REFERRING PROVIDER: Celene Squibb, MD Celene Squibb, MD [place],  [place] [address]  RESPONSIBLE PARTY:  [place]     Name Relation Home Work Mobile   Reed,Carolyn Sister [phone]     Warrick Parisian   [phone]   Pettigrew,Cheryl Other   [phone]       Visit is to build trust and highlight Palliative Medicine as specialized medical care for people living with serious illness, aimed at facilitating better quality of life through symptoms relief, assisting with advance care planning and complex medical decision making. This is a follow up visit.  RECOMMENDATIONS/PLAN:   Advance Care Planning/Code Status:Patient is a Do Not Resuscitate  Goals of Care: Goals of care include to maximize quality of life and symptom management. MOST form selections in facility chart include Comfort measures, no feeding tube, IV fluids if indicated, determine use of antibiotic when infecion occurs. Palliative care team will continue to support patient, patient's family, and medical team.  Symptom management/Plan:  Weight loss: Current weight 128.2 pounds down from 140 pounds 4 months ago.  5 feet 2 inches.  Patient currently taking mirtazapine.  Offered help during meals to ensure adequate oral intake.  Continue facility nutritional treat, liquid protein.  Monitor weight per facility protocol. Anxiety: Managed with buspirone.  Continue deescalation technique. Psych following.  Depression: Continue mirtazapine and follow-up with psych as planned/needed. Encourage interpersonal interactions and to participate in facility activities.  Seizure disorder: Managed with Levitracetam.  No  seizure since last visit.  Neurologist consult as needed. Low back pain: Chronic/stable.  Managed with Hydrocodone, Effective.  Continue to participate in restorative activities in the facility. Heat therapy encouraged.   Insomnia: Continue trazodone.  Sleep hygiene reiterated.   Follow up: Palliative care will continue to follow for complex medical decision making, advance care planning, and clarification of goals. Return 6 weeks or prn. Encouraged to call provider sooner with any concerns.  CHIEF COMPLAINT: Palliative follow up  HISTORY OF PRESENT ILLNESS:  [name] [name] a 89 y.o. female with multiple medical problems including chronic low back pain, insomnia, depression.  History of CKD4, CAD, bladder cancer-Foley cath in place, seizure disorder, recurrent falls.  Patient denied pain/discomfort, no complaints today.  History obtained from review of EMR, discussion with primary team, family and/or patient. Records reviewed and summarized above. All 10 point systems reviewed and are negative except as documented in history of present illness above  Review and summarization of Epic records shows history from other than patient.   Palliative Care was asked to follow this patient o help address complex decision making in the context of advance care planning and goals of care clarification.   PERTINENT MEDICATIONS:  Outpatient Encounter Medications as of 05/02/2022  Medication Sig   acetaminophen (TYLENOL) 325 MG tablet Take 325 mg by mouth every 6 (six) hours as needed for mild pain.   amLODipine (NORVASC) 5 MG tablet Take 1 tablet (5 mg total) by mouth daily.   Cranberry 450 MG TABS Take 450 mg by mouth daily.   Eyelid Cleansers (EYESCRUB) PADS Apply 1 each topically daily.   levETIRAcetam (KEPPRA) 250 MG tablet Take 1 tablet (250 mg total)  by mouth 2 (two) times daily.   linaclotide (LINZESS) 290 MCG CAPS capsule Take 290 mcg by mouth daily.   loperamide (IMODIUM) 2 MG capsule Take 2 mg by mouth  every 4 (four) hours as needed for diarrhea or loose stools.   melatonin 3 MG TABS tablet Take 3 mg by mouth at bedtime.   Multiple Vitamins-Minerals (PRESERVISION AREDS PO) Take 1 capsule by mouth daily.   NONFORMULARY OR COMPOUNDED ITEM Apply 1 application topically in the morning and at bedtime. Anti -itch lotion 0.5-0.5% ( Mix with triamcinolone)   oxybutynin (DITROPAN-XL) 10 MG 24 hr tablet Take 10 mg by mouth daily.   phenol (CHLORASEPTIC) 1.4 % LIQD Use as directed 1 spray in the mouth or throat every 4 (four) hours as needed for throat irritation / pain.   rosuvastatin (CRESTOR) 10 MG tablet Take 10 mg by mouth daily.    sertraline (ZOLOFT) 25 MG tablet Take 25 mg by mouth daily.   triamcinolone cream (KENALOG) 0.1 % Apply 1 application topically 2 (two) times daily. Mix with Sarna lotion   Wheat Dextrin (BENEFIBER) POWD Take 1 Scoop by mouth daily at 6 (six) AM.   No facility-administered encounter medications on file as of 05/02/2022.    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    Latex Itching   Macrobid [Nitrofurantoin] Other (See Comments)    unknown   Penicillins Hives and Itching    Tolerated Ceftriaxone 01/2021   Sulfa Antibiotics Hives and Itching      I spent 35 minutes providing this consultation; this includes time spent with patient/family, chart review and documentation. More than  50% of the time in this consultation was spent on counseling and coordinating communication   Thank you for the opportunity to participate in the care of [name] [name] Please call our office at [phone] if we can be of additional assistance.  Note: Portions of this note were generated with Lobbyist. Dictation errors may occur despite best attempts at proofreading.  Teodoro Spray, NP

## 2022-05-27 ENCOUNTER — Non-Acute Institutional Stay: Payer: 59 | Admitting: Hospice

## 2022-05-27 DIAGNOSIS — F419 Anxiety disorder, unspecified: Secondary | ICD-10-CM

## 2022-05-27 DIAGNOSIS — G4700 Insomnia, unspecified: Secondary | ICD-10-CM

## 2022-05-27 DIAGNOSIS — R634 Abnormal weight loss: Secondary | ICD-10-CM

## 2022-05-27 DIAGNOSIS — G8929 Other chronic pain: Secondary | ICD-10-CM

## 2022-05-27 DIAGNOSIS — Z515 Encounter for palliative care: Secondary | ICD-10-CM

## 2022-05-27 DIAGNOSIS — G40909 Epilepsy, unspecified, not intractable, without status epilepticus: Secondary | ICD-10-CM

## 2022-05-27 DIAGNOSIS — F339 Major depressive disorder, recurrent, unspecified: Secondary | ICD-10-CM

## 2022-05-27 NOTE — Progress Notes (Signed)
[facility] Consult Note Telephone: [phone]  Fax: [phone]  PATIENT NAME: [name] [name] DOB: [id number] MRN: [id number]  PRIMARY CARE PROVIDER:   Celene Squibb, MD Celene Squibb, MD [place],  [place] [address]  REFERRING PROVIDER: Celene Squibb, MD Celene Squibb, MD [place],  [place] [address]  RESPONSIBLE PARTY:  [place]     Name Relation Home Work Mobile   Reed,Carolyn Sister [phone]     Warrick Parisian   [phone]   Pettigrew,Cheryl Other   [phone]       Visit is to build trust and highlight Palliative Medicine as specialized medical care for people living with serious illness, aimed at facilitating better quality of life through symptoms relief, assisting with advance care planning and complex medical decision making. This is a follow up visit.  Visit consisted of counseling and education dealing with the complex and emotionally intense issues of symptom management and palliative care in the setting of serious and potentially life-threatening illness.  Palliative care f/u visit for further discussion/monitor trends of appetite, weights, monitor for functional, cognitive decline with chronic disease progression, assess any active symptoms, supportive role.  Palliative care team will continue to support patient, patient's family, and medical team.  RECOMMENDATIONS/PLAN:   Advance Care Planning/Code Status:Patient is a Do Not Resuscitate  Goals of Care: Goals of care include to maximize quality of life and symptom management. MOST form selections in facility chart include Comfort measures, no feeding tube, IV fluids if indicated, determine use of antibiotic when infecion occurs. Palliative care team will continue to support patient, patient's family, and medical team.  Symptom management/Plan:  Weight loss: Current weight 127.2 Ibs 05/12/2022, 128.2 pounds last  month, overall down from 140 pounds 5 months ago.  5 feet 2 inches.  Patient currently taking mirtazapine.  Offered help during meals to ensure adequate oral intake.  Continue mechanical soft diet, facility nutritional treat, liquid protein.  Monitor weight per facility protocol. Anxiety: Managed with buspirone.  Continue deescalation technique. Psych following.  Depression: Patient often refuses to go for facility activities.  Continue mirtazapine and follow-up with psych as planned/needed. Encourage interpersonal interactions and to participate in facility activities- book readings, movie nights, church services.  Seizure disorder: Continue Levitracetam.  No seizure since last visit.  Neurologist consult as needed. Low back pain: Chronic/stable.  Managed with Hydrocodone, Effective.  Continue to participate in restorative activities in the facility. Heat therapy encouraged.   Insomnia: Continue trazodone, melatonin.  Sleep hygiene reiterated.   Follow up: Palliative care will continue to follow for complex medical decision making, advance care planning, and clarification of goals. Return 6 weeks or prn. Encouraged to call provider sooner with any concerns.  CHIEF COMPLAINT: Palliative follow up  HISTORY OF PRESENT ILLNESS:  [name] [name] a 89 y.o. female with multiple medical problems including chronic low back pain, insomnia, depression.  History of CKD4, CAD, bladder cancer-Foley cath in place, seizure disorder, recurrent falls. Patient sleeping on NP arrival, easily arousable and answered questions directed to her; continues to decline in functional status - currently non ambulatory. Patient denied pain/discomfort, no complaints today.  History obtained from review of EMR, discussion with primary team, family and/or patient. Records reviewed and summarized above. All 10 point systems reviewed and are negative except as documented in history of present illness above  Review and summarization of Epic  records shows history from  other than patient.   Palliative Care was asked to follow this patient o help address complex decision making in the context of advance care planning and goals of care clarification.   PERTINENT MEDICATIONS:  Outpatient Encounter Medications as of 05/27/2022  Medication Sig   acetaminophen (TYLENOL) 325 MG tablet Take 325 mg by mouth every 6 (six) hours as needed for mild pain.   amLODipine (NORVASC) 5 MG tablet Take 1 tablet (5 mg total) by mouth daily.   Cranberry 450 MG TABS Take 450 mg by mouth daily.   Eyelid Cleansers (EYESCRUB) PADS Apply 1 each topically daily.   levETIRAcetam (KEPPRA) 250 MG tablet Take 1 tablet (250 mg total) by mouth 2 (two) times daily.   linaclotide (LINZESS) 290 MCG CAPS capsule Take 290 mcg by mouth daily.   loperamide (IMODIUM) 2 MG capsule Take 2 mg by mouth every 4 (four) hours as needed for diarrhea or loose stools.   melatonin 3 MG TABS tablet Take 3 mg by mouth at bedtime.   Multiple Vitamins-Minerals (PRESERVISION AREDS PO) Take 1 capsule by mouth daily.   NONFORMULARY OR COMPOUNDED ITEM Apply 1 application topically in the morning and at bedtime. Anti -itch lotion 0.5-0.5% ( Mix with triamcinolone)   oxybutynin (DITROPAN-XL) 10 MG 24 hr tablet Take 10 mg by mouth daily.   phenol (CHLORASEPTIC) 1.4 % LIQD Use as directed 1 spray in the mouth or throat every 4 (four) hours as needed for throat irritation / pain.   rosuvastatin (CRESTOR) 10 MG tablet Take 10 mg by mouth daily.    sertraline (ZOLOFT) 25 MG tablet Take 25 mg by mouth daily.   triamcinolone cream (KENALOG) 0.1 % Apply 1 application topically 2 (two) times daily. Mix with Sarna lotion   Wheat Dextrin (BENEFIBER) POWD Take 1 Scoop by mouth daily at 6 (six) AM.   No facility-administered encounter medications on file as of 05/27/2022.    HOSPICE ELIGIBILITY/DIAGNOSIS: TBD  PAST MEDICAL HISTORY:  Past Medical History:  Diagnosis Date   Abnormal gait    Asthenia     Back pain    Bladder cancer (HCC)    CAD (coronary artery disease)    Chronic pain    CKD (chronic kidney disease), stage IV (HCC)    Dizziness    Dysphagia    Epileptic grand mal status ()    Essential hypertension, benign    Hypokalemia    Insomnia    Iron deficiency    Macular degeneration    Mixed hyperlipidemia    Obesity    Postsurgical aortocoronary bypass status     ALLERGIES:  Allergies  Allergen Reactions   Adhesive [Tape] Other (See Comments)    Tears patients skin off.    Latex Itching   Macrobid [Nitrofurantoin] Other (See Comments)    unknown   Penicillins Hives and Itching    Tolerated Ceftriaxone 01/2021   Sulfa Antibiotics Hives and Itching      I spent 35 minutes providing this consultation; this includes time spent with patient/family, chart review and documentation. More than 50% of the time in this consultation was spent on counseling and coordinating communication   Thank you for the opportunity to participate in the care of [name] [name] Please call our office at [phone] if we can be of additional assistance.  Note: Portions of this note were generated with Lobbyist. Dictation errors may occur despite best attempts at proofreading.  Teodoro Spray, NP
